# Patient Record
Sex: Female | Born: 1950
Health system: Southern US, Community
[De-identification: ages and names within clinical notes are randomized; demographics above are authoritative.]

## PROBLEM LIST (undated history)

## (undated) DIAGNOSIS — T7840XA Allergy, unspecified, initial encounter: Secondary | ICD-10-CM

## (undated) DIAGNOSIS — M810 Age-related osteoporosis without current pathological fracture: Secondary | ICD-10-CM

## (undated) DIAGNOSIS — R059 Cough, unspecified: Secondary | ICD-10-CM

## (undated) DIAGNOSIS — D649 Anemia, unspecified: Secondary | ICD-10-CM

## (undated) DIAGNOSIS — R05 Cough: Secondary | ICD-10-CM

## (undated) DIAGNOSIS — H269 Unspecified cataract: Secondary | ICD-10-CM

## (undated) DIAGNOSIS — E079 Disorder of thyroid, unspecified: Secondary | ICD-10-CM

## (undated) DIAGNOSIS — K219 Gastro-esophageal reflux disease without esophagitis: Secondary | ICD-10-CM

## (undated) DIAGNOSIS — R51 Headache: Secondary | ICD-10-CM

## (undated) DIAGNOSIS — M1990 Unspecified osteoarthritis, unspecified site: Secondary | ICD-10-CM

## (undated) DIAGNOSIS — K5790 Diverticulosis of intestine, part unspecified, without perforation or abscess without bleeding: Secondary | ICD-10-CM

## (undated) DIAGNOSIS — I10 Essential (primary) hypertension: Secondary | ICD-10-CM

## (undated) HISTORY — DX: Diverticulosis of intestine, part unspecified, without perforation or abscess without bleeding: K57.90

## (undated) HISTORY — DX: Disorder of thyroid, unspecified: E07.9

## (undated) HISTORY — PX: DENTAL SURGERY: SHX609

## (undated) HISTORY — PX: OTHER SURGICAL HISTORY: SHX169

## (undated) HISTORY — PX: UPPER GASTROINTESTINAL ENDOSCOPY: SHX188

## (undated) HISTORY — DX: Cough: R05

## (undated) HISTORY — DX: Unspecified osteoarthritis, unspecified site: M19.90

## (undated) HISTORY — DX: Allergy, unspecified, initial encounter: T78.40XA

## (undated) HISTORY — DX: Headache: R51

## (undated) HISTORY — DX: Essential (primary) hypertension: I10

## (undated) HISTORY — DX: Gastro-esophageal reflux disease without esophagitis: K21.9

## (undated) HISTORY — DX: Age-related osteoporosis without current pathological fracture: M81.0

## (undated) HISTORY — DX: Unspecified cataract: H26.9

## (undated) HISTORY — DX: Cough, unspecified: R05.9

## (undated) HISTORY — DX: Anemia, unspecified: D64.9

---

## 1998-04-29 ENCOUNTER — Ambulatory Visit (HOSPITAL_COMMUNITY): Admission: RE | Admit: 1998-04-29 | Discharge: 1998-04-29 | Payer: Self-pay | Admitting: *Deleted

## 1998-09-28 ENCOUNTER — Ambulatory Visit (HOSPITAL_COMMUNITY): Admission: RE | Admit: 1998-09-28 | Discharge: 1998-09-28 | Payer: Self-pay | Admitting: *Deleted

## 1999-03-09 ENCOUNTER — Other Ambulatory Visit: Admission: RE | Admit: 1999-03-09 | Discharge: 1999-03-09 | Payer: Self-pay | Admitting: Obstetrics and Gynecology

## 1999-04-08 ENCOUNTER — Ambulatory Visit (HOSPITAL_COMMUNITY): Admission: RE | Admit: 1999-04-08 | Discharge: 1999-04-08 | Payer: Self-pay | Admitting: *Deleted

## 2000-03-09 ENCOUNTER — Other Ambulatory Visit: Admission: RE | Admit: 2000-03-09 | Discharge: 2000-03-09 | Payer: Self-pay | Admitting: *Deleted

## 2000-03-27 ENCOUNTER — Ambulatory Visit (HOSPITAL_COMMUNITY): Admission: RE | Admit: 2000-03-27 | Discharge: 2000-03-27 | Payer: Self-pay | Admitting: *Deleted

## 2000-03-27 ENCOUNTER — Encounter: Payer: Self-pay | Admitting: *Deleted

## 2000-03-30 ENCOUNTER — Encounter: Admission: RE | Admit: 2000-03-30 | Discharge: 2000-03-30 | Payer: Self-pay | Admitting: *Deleted

## 2000-03-30 ENCOUNTER — Encounter: Payer: Self-pay | Admitting: *Deleted

## 2000-09-06 ENCOUNTER — Encounter: Admission: RE | Admit: 2000-09-06 | Discharge: 2000-09-06 | Payer: Self-pay | Admitting: Family Medicine

## 2000-09-06 ENCOUNTER — Encounter: Payer: Self-pay | Admitting: Family Medicine

## 2001-08-14 ENCOUNTER — Other Ambulatory Visit: Admission: RE | Admit: 2001-08-14 | Discharge: 2001-08-14 | Payer: Self-pay | Admitting: Obstetrics and Gynecology

## 2001-08-14 ENCOUNTER — Encounter: Payer: Self-pay | Admitting: Obstetrics and Gynecology

## 2001-08-14 ENCOUNTER — Encounter: Admission: RE | Admit: 2001-08-14 | Discharge: 2001-08-14 | Payer: Self-pay | Admitting: Obstetrics and Gynecology

## 2002-09-01 ENCOUNTER — Other Ambulatory Visit: Admission: RE | Admit: 2002-09-01 | Discharge: 2002-09-01 | Payer: Self-pay | Admitting: Obstetrics and Gynecology

## 2003-10-27 ENCOUNTER — Other Ambulatory Visit: Admission: RE | Admit: 2003-10-27 | Discharge: 2003-10-27 | Payer: Self-pay | Admitting: Obstetrics and Gynecology

## 2004-04-21 ENCOUNTER — Ambulatory Visit: Payer: Self-pay | Admitting: Internal Medicine

## 2004-11-04 ENCOUNTER — Other Ambulatory Visit: Admission: RE | Admit: 2004-11-04 | Discharge: 2004-11-04 | Payer: Self-pay | Admitting: Obstetrics and Gynecology

## 2004-11-10 ENCOUNTER — Ambulatory Visit (HOSPITAL_COMMUNITY): Admission: RE | Admit: 2004-11-10 | Discharge: 2004-11-10 | Payer: Self-pay | Admitting: Obstetrics and Gynecology

## 2004-12-21 ENCOUNTER — Other Ambulatory Visit: Admission: RE | Admit: 2004-12-21 | Discharge: 2004-12-21 | Payer: Self-pay | Admitting: Interventional Radiology

## 2004-12-21 ENCOUNTER — Encounter (INDEPENDENT_AMBULATORY_CARE_PROVIDER_SITE_OTHER): Payer: Self-pay | Admitting: *Deleted

## 2004-12-21 ENCOUNTER — Encounter: Admission: RE | Admit: 2004-12-21 | Discharge: 2004-12-21 | Payer: Self-pay | Admitting: *Deleted

## 2005-06-30 ENCOUNTER — Encounter: Admission: RE | Admit: 2005-06-30 | Discharge: 2005-06-30 | Payer: Self-pay | Admitting: *Deleted

## 2006-07-20 ENCOUNTER — Encounter: Admission: RE | Admit: 2006-07-20 | Discharge: 2006-07-20 | Payer: Self-pay | Admitting: *Deleted

## 2008-03-06 LAB — CONVERTED CEMR LAB: Pap Smear: NORMAL

## 2008-04-07 ENCOUNTER — Encounter: Admission: RE | Admit: 2008-04-07 | Discharge: 2008-04-07 | Payer: Self-pay | Admitting: Obstetrics and Gynecology

## 2008-06-29 HISTORY — PX: NASAL SINUS SURGERY: SHX719

## 2008-12-24 LAB — CONVERTED CEMR LAB
HDL: 44 mg/dL
LDL Cholesterol: 106 mg/dL

## 2009-01-14 ENCOUNTER — Ambulatory Visit: Payer: Self-pay | Admitting: Family Medicine

## 2009-01-14 ENCOUNTER — Encounter: Payer: Self-pay | Admitting: Family Medicine

## 2009-01-14 DIAGNOSIS — E119 Type 2 diabetes mellitus without complications: Secondary | ICD-10-CM

## 2009-01-14 DIAGNOSIS — I10 Essential (primary) hypertension: Secondary | ICD-10-CM | POA: Insufficient documentation

## 2009-01-14 DIAGNOSIS — E559 Vitamin D deficiency, unspecified: Secondary | ICD-10-CM

## 2009-01-14 DIAGNOSIS — E039 Hypothyroidism, unspecified: Secondary | ICD-10-CM | POA: Insufficient documentation

## 2009-02-09 ENCOUNTER — Telehealth: Payer: Self-pay | Admitting: Family Medicine

## 2009-04-16 ENCOUNTER — Telehealth: Payer: Self-pay | Admitting: Family Medicine

## 2009-05-06 ENCOUNTER — Telehealth: Payer: Self-pay | Admitting: Family Medicine

## 2009-07-15 ENCOUNTER — Telehealth: Payer: Self-pay | Admitting: Family Medicine

## 2009-08-18 ENCOUNTER — Telehealth: Payer: Self-pay | Admitting: Family Medicine

## 2009-10-04 ENCOUNTER — Telehealth: Payer: Self-pay | Admitting: Family Medicine

## 2009-10-26 ENCOUNTER — Encounter: Payer: Self-pay | Admitting: Family Medicine

## 2010-04-05 ENCOUNTER — Encounter: Payer: Self-pay | Admitting: Family Medicine

## 2010-04-06 ENCOUNTER — Telehealth: Payer: Self-pay | Admitting: Family Medicine

## 2010-04-12 ENCOUNTER — Encounter: Payer: Self-pay | Admitting: Family Medicine

## 2010-04-19 ENCOUNTER — Telehealth: Payer: Self-pay | Admitting: Family Medicine

## 2010-04-21 ENCOUNTER — Encounter: Payer: Self-pay | Admitting: Family Medicine

## 2010-04-21 LAB — CONVERTED CEMR LAB: Pap Smear: NORMAL

## 2010-05-10 ENCOUNTER — Ambulatory Visit
Admission: RE | Admit: 2010-05-10 | Discharge: 2010-05-10 | Payer: Self-pay | Source: Home / Self Care | Attending: Family Medicine | Admitting: Family Medicine

## 2010-05-10 ENCOUNTER — Encounter: Payer: Self-pay | Admitting: Family Medicine

## 2010-05-22 ENCOUNTER — Encounter: Payer: Self-pay | Admitting: Obstetrics and Gynecology

## 2010-05-25 ENCOUNTER — Encounter: Payer: Self-pay | Admitting: Family Medicine

## 2010-06-02 NOTE — Progress Notes (Signed)
Summary: Metformin  Phone Note Refill Request Message from:  Scriptline on May 06, 2009 9:24 AM  Refills Requested: Medication #1:  METFORMIN HCL 500 MG TABS 1 tab by mouth by mouth two times a day with food.. [facility]  Is there a reason why she's only given 1 RF at a time?   Method Requested: Electronic Initial call taken by: Delilah Shan CMA (AAMA),  May 06, 2009 9:25 AM  Follow-up for Phone Call        No, we can do more at a time. Follow-up by: Ruthe Mannan MD,  May 06, 2009 9:41 AM    Prescriptions: METFORMIN HCL 500 MG TABS (METFORMIN HCL) 1 tab by mouth by mouth two times a day with food.  #60 x 11   Entered and Authorized by:   Ruthe Mannan MD   Signed by:   Ruthe Mannan MD on 05/06/2009   Method used:   Electronically to        [id number]       [address]       [place], [place]  [address]       Ph: [phone]       Fax: [phone]   RxID:   [id number]

## 2010-06-02 NOTE — Progress Notes (Signed)
Summary: Vitamin D2  Phone Note Refill Request Message from:  Scriptline on July 15, 2009 11:14 AM  Refills Requested: Medication #1:  ERGOCALCIFEROL 50000 UNIT CAPS One capsule once a week. [facility]  [address] [id number]   Last Lenox Ahr Date:  No date sent   Pharmacy Phone:  [phone]   Method Requested: Electronic Initial call taken by: Delilah Shan CMA (AAMA),  July 15, 2009 11:15 AM    Prescriptions: ERGOCALCIFEROL 50000 UNIT CAPS (ERGOCALCIFEROL) One capsule once a week.  #4 x 0   Entered and Authorized by:   Ruthe Mannan MD   Signed by:   Ruthe Mannan MD on 07/15/2009   Method used:   Electronically to        [id number]       [address]       [place], [place]  [address]       Ph: [phone]       Fax: [phone]   RxID:   [id number]

## 2010-06-02 NOTE — Progress Notes (Signed)
Summary: test srips  Phone Note Call from Patient   Summary of Call: [facility] called stating that [facility] still have not received rx for the test strips and was very upset about it, I told her that we sent it in on 12-6 and 12-13. [facility] to [facility] the test strips.  Initial call taken by: Melody Comas,  April 19, 2010 10:50 AM

## 2010-06-02 NOTE — Miscellaneous (Signed)
Summary: prevention update  Clinical Lists Changes  Observations: Added new observation of MAMMO DUE: 04/06/2011 (05/25/2010 7:54) Added new observation of PAP DUE: 04/22/2011 (05/25/2010 7:54) Added new observation of LAST PAP DAT: 04/21/2010 (04/21/2010 7:55) Added new observation of PAP SMEAR: normal (04/21/2010 7:55) Added new observation of LAST MAM DAT: 04/05/2010 (04/05/2010 7:55) Added new observation of MAMMOGRAM: normal (04/05/2010 7:55)     Last PAP:  normal (04/06/2010 8:04:46 AM) PAP Result Date:  04/21/2010 PAP Result:  normal Last Mammogram:  normal (04/06/2010 8:04:46 AM) Mammogram Result Date:  04/05/2010 Mammogram Result:  normal

## 2010-06-02 NOTE — Progress Notes (Signed)
Summary: Rx Vitamin D  Phone Note Refill Request Call back at [phone] Message from:  [facility] on October 05, 4538 9:81 AM  Refills Requested: Medication #1:  ERGOCALCIFEROL 50000 UNIT CAPS One capsule once a week. Received E-script request please advise.  Patient received a refill on 08/18/2009, #4, not sure if patient is to continue taking this or not.  Please advise.   Method Requested: Electronic Initial call taken by: Linde Gillis CMA Duncan Dull),  October 04, 2009 8:33 AM  Follow-up for Phone Call        Please deny refill for now and have her come in for lab visit to recheck Vit D. Follow-up by: Ruthe Mannan MD,  October 04, 2009 8:38 AM  Additional Follow-up for Phone Call Additional follow up Details #1::        Rx denied, patient scheduled to have vit d level checked tomorrow. Additional Follow-up by: Linde Gillis CMA Duncan Dull),  October 04, 2009 8:53 AM

## 2010-06-02 NOTE — Assessment & Plan Note (Signed)
Summary: FOLLOW UP   Vital Signs:  Patient profile:   60 year old female Height:      64 inches Weight:      189.50 pounds BMI:     32.65 Temp:     98.0 degrees F oral Pulse rate:   68 / minute Pulse rhythm:   regular BP sitting:   102 / 80  (left arm) Cuff size:   regular  Vitals Entered By: Linde Gillis CMA Duncan Dull) (May 10, 2010 7:50 AM) CC: follow up diabetes   History of Present Illness: 60 yo WF here to follow up.  1.  DM- last checked a few months ago at work and per pt, a1c improved (was 8.2 in 09/2009). On Metoformin 1000 mg two times a day and Amaryl 4 mg daily. Checks CBGs daily, running between 87-218.  Does not want a1c checked today because she was on steroids recently and made her sugars increase. Denies any episodes of hypoglycemia.  2.  Hypothyoidism- had a benign nodule removed several years ago.  Has been on Levothyroxine 75 micrograms for over a year.  Has been noticing more fatigue lately.  3.  HTN- has been controlled on Losartan.  No allergy to ACEI.  Has lost 10 pounds since last year with diet!  4.  Well woman- per pt, just saw Dr. Richardean Chimera last month- normal pap, mammogram and stool cards.    Current Medications (verified): 1)  Levothyroxine Sodium 75 Mcg Tabs (Levothyroxine Sodium) .... Take 1 Tablet By Mouth Once A Day 2)  Losartan Potassium 50 Mg Tabs (Losartan Potassium) .... Take 1 Tablet By Mouth Once A Day 3)  Oxybutynin Chloride 5 Mg Xr24h-Tab (Oxybutynin Chloride) .... Take 1 Tablet By Mouth Once A Day 4)  Nasonex 50 Mcg/act Susp (Mometasone Furoate) .... Once Daily 5)  Onetouch Ultra Test  Strp (Glucose Blood) .... Check Blood Sugar Two Times A Day 6)  Fish Oil   Oil (Fish Oil) .... Once Daily 7)  Stool Softener 100 Mg Caps (Docusate Sodium) .... 50 Mg. Once Daily 8)  Aspirin 81 Mg  Tabs (Aspirin) .... Take 1 Tablet By Mouth Once A Day 9)  Ultra Mega Nu Iron Plus .... Once Daily 10)  Lloyd Huger Med Sinus Rinse .... Once Daily 11)   Metformin Hcl 1000 Mg Tabs (Metformin Hcl) .... Take 1 Tablet By Mouth Two Times A Day 12)  Amaryl 4 Mg Tabs (Glimepiride) .[name].. 1 Tab By Mouth Daily.  Allergies: 1)  ! Penicillin  Past History:  Past Medical History: Last updated: 01/23/2009 HTN DM Hypothyroidism Chronic sinusitis s/p sinus surgery in 06/2008 (Dr.  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  Lives with husband and 61 yo son Harrison Mons), also has a 30 year old daughter Harriett Sine) who is a Engineer, manufacturing for [employer].  Risk Factors: Alcohol Use: <1 (01/23/09)  Risk Factors: Smoking Status: never (01/23/2009)  Review of Systems      See HPI General:  Complains of fatigue. Eyes:  Denies blurring. ENT:  Denies difficulty swallowing. CV:  Denies chest pain or discomfort. Resp:  Denies shortness of breath. Psych:  Denies anxiety and depression. Endo:  Denies cold intolerance and heat intolerance.  Physical Exam  General:  Well-developed,well-nourished,in no acute distress;  alert,appropriate and cooperative throughout examination, obese Head:  normocephalic and atraumatic.   Eyes:  vision grossly intact, pupils equal, pupils round, and pupils reactive to light.   Ears:  R ear normal and L ear normal.   Nose:  no external deformity.   Mouth:  good dentition.   Neck:  No deformities, masses, or tenderness noted. Lungs:  Normal respiratory effort, chest expands symmetrically. Lungs are clear to auscultation, no crackles or wheezes. Heart:  Normal rate and regular rhythm. S1 and S2 normal without gallop, murmur, click, rub or other extra sounds. Extremities:  No clubbing, cyanosis, edema, or deformity noted with normal full range of motion of all joints.   Neurologic:   alert & oriented X3 and gait normal.   Skin:  Intact without suspicious lesions or rashes Psych:  Oriented X3, not anxious appearing, and not depressed appearing.     Impression & Recommendations:  Problem # 1:  DM (ICD-250.00) Assessment Unchanged Per pt, she does not want a1c today. She is fasting, will check a fasting CBG (BMET). Continue current meds, follow up in 3 months.   Her updated medication list for this problem includes:    Losartan Potassium 50 Mg Tabs (Losartan potassium) .[name]... Take 1 tablet by mouth once a day    Aspirin 81 Mg Tabs (Aspirin) .[name]... Take 1 tablet by mouth once a day    Metformin Hcl 1000 Mg Tabs (Metformin hcl) .[name]... Take 1 tablet by mouth two times a day    Amaryl 4 Mg Tabs (Glimepiride) .[name]... 1 tab by mouth daily.  Orders: Venipuncture (84132) Specimen Handling (44010) TLB-Lipid Panel (80061-LIPID) TLB-BMP (Basic Metabolic Panel-BMET) (80048-METABOL) TLB-TSH (Thyroid Stimulating Hormone) (84443-TSH)  Problem # 2:  UNSPECIFIED HYPOTHYROIDISM (ICD-244.9) Assessment: Unchanged will recheck TSH today. Her updated medication list for this problem includes:    Levothyroxine Sodium 75 Mcg Tabs (Levothyroxine sodium) .[name]... Take 1 tablet by mouth once a day  Orders: Specimen Handling ([address]) TLB-Lipid Panel (80061-LIPID) TLB-BMP (Basic Metabolic Panel-BMET) (80048-METABOL) TLB-TSH (Thyroid Stimulating Hormone) (84443-TSH)  Problem # 3:  SCREENING FOR ISCHEMIC HEART DISEASE (ICD-V81.0) lipid panel today. Orders: Specimen Handling (66440) TLB-Lipid Panel (80061-LIPID) TLB-BMP (Basic Metabolic Panel-BMET) (80048-METABOL) TLB-TSH (Thyroid Stimulating Hormone) (84443-TSH)  Problem # 4:  HEALTH SCREENING (ICD-V70.0) Assessment: Comment Only Will request records from Dr. Arelia Sneddon.  Complete Medication List: 1)  Levothyroxine Sodium 75 Mcg Tabs (Levothyroxine sodium) .... Take 1 tablet by mouth once a day 2)  Losartan Potassium 50 Mg Tabs (Losartan  potassium) .... Take 1 tablet by mouth once a day 3)  Oxybutynin Chloride 5 Mg Xr24h-tab (Oxybutynin chloride) .... Take 1 tablet by mouth once a day 4)  Nasonex 50 Mcg/act Susp (Mometasone furoate) .... Once daily 5)  Onetouch Ultra Test Strp (Glucose blood) .... Check blood sugar two times a day 6)  Fish Oil Oil (Fish oil) .... Once daily 7)  Stool Softener 100 Mg Caps (Docusate sodium) .... 50 mg. once daily 8)  Aspirin 81 Mg Tabs (Aspirin) .... Take 1 tablet by mouth once a day 9)  Ultra Mega Nu Iron Plus  .... Once daily 10)  Lloyd Huger Med Sinus Rinse  .... Once daily 11)  Metformin Hcl 1000 Mg Tabs (Metformin hcl) .... Take 1 tablet by mouth two times a day 12)  Amaryl 4 Mg Tabs (Glimepiride) .[name].. 1 tab by mouth daily. Prescriptions: AMARYL 4 MG TABS (GLIMEPIRIDE) 1 tab by mouth daily.  #90 x 3   Entered and Authorized by:   Jovita Gamma  Dayton Martes MD   Signed by:   Ruthe Mannan MD on 05/10/2010   Method used:   Print then Give to Patient   RxID:   [id number] METFORMIN HCL 1000 MG TABS (METFORMIN HCL) Take 1 tablet by mouth two times a day  #180 x 3   Entered and Authorized by:   Ruthe Mannan MD   Signed by:   Ruthe Mannan MD on 05/10/2010   Method used:   Print then Give to Patient   RxID:   [id number] ONETOUCH ULTRA TEST  STRP (GLUCOSE BLOOD) Check blood sugar two times a day  #100 x 12   Entered and Authorized by:   Ruthe Mannan MD   Signed by:   Ruthe Mannan MD on 05/10/2010   Method used:   Print then Give to Patient   RxID:   [id number] NASONEX 50 MCG/ACT SUSP (MOMETASONE FUROATE) once daily  #3 x 3   Entered and Authorized by:   Ruthe Mannan MD   Signed by:   Ruthe Mannan MD on 05/10/2010   Method used:   Print then Give to Patient   RxID:   [id number] OXYBUTYNIN CHLORIDE 5 MG XR24H-TAB (OXYBUTYNIN CHLORIDE) Take 1 tablet by mouth once a day  #90 x 3   Entered and Authorized by:   Ruthe Mannan MD   Signed by:   Ruthe Mannan MD on 05/10/2010   Method used:   Print then Give to  Patient   RxID:   [id number] LOSARTAN POTASSIUM 50 MG TABS (LOSARTAN POTASSIUM) Take 1 tablet by mouth once a day  #90 x 3   Entered and Authorized by:   Ruthe Mannan MD   Signed by:   Ruthe Mannan MD on 05/10/2010   Method used:   Print then Give to Patient   RxID:   [id number] LEVOTHYROXINE SODIUM 75 MCG TABS (LEVOTHYROXINE SODIUM) Take 1 tablet by mouth once a day  #90 x 3   Entered and Authorized by:   Ruthe Mannan MD   Signed by:   Ruthe Mannan MD on 05/10/2010   Method used:   Print then Give to Patient   RxID:   [id number]    Orders Added: 1)  Venipuncture [70623] 2)  Specimen Handling [99000] 3)  TLB-Lipid Panel [80061-LIPID] 4)  TLB-BMP (Basic Metabolic Panel-BMET) [80048-METABOL] 5)  TLB-TSH (Thyroid Stimulating Hormone) [84443-TSH] 6)  Est. Patient Level IV [76283]    Current Allergies (reviewed today): ! PENICILLIN  Last PAP:  normal (03/06/2008 11:08:18 AM) PAP Result Date:  04/06/2010 PAP Result:  normal Last Mammogram:  normal (03/06/2008 11:08:18 AM) Mammogram Result Date:  04/06/2010 Mammogram Result:  normal

## 2010-06-02 NOTE — Progress Notes (Signed)
Summary: regarding labs  Phone Note Call from Patient Call back at Work Phone [phone]   Caller: Patient Call For: Ruthe Mannan MD Summary of Call: Pt is due to have vitamin D level checked and she is asking if there are any other labs that you want checked.  She would like a written order because she works for [employer] and can get it done there for free.  Call when ready and she will pick up. Initial call taken by: Lowella Petties CMA,  October 04, 2009 2:50 PM  Follow-up for Phone Call        In my box. Ruthe Mannan MD  October 04, 2009 2:52 PM  Dr. Dayton Martes wrote order for Vitamin D and HGBA1C.  Left message on cell phone voicemail that order would be left at front desk for pick up.  Follow-up by: Linde Gillis CMA Duncan Dull),  October 04, 2009 2:57 PM

## 2010-06-02 NOTE — Progress Notes (Signed)
Summary: Vitamind D  Phone Note Refill Request Message from:  Scriptline on August 18, 2009 8:40 AM  Refills Requested: Medication #1:  ERGOCALCIFEROL 50000 UNIT CAPS One capsule once a week. Not sure that this patient is supposed to continue Vitamin D. [facility]  [address] [id number]   Last Lenox Ahr Date:  No date sent   Pharmacy Phone:  [phone]   Method Requested: Electronic Initial call taken by: Delilah Shan CMA (AAMA),  August 18, 2009 8:41 AM    Prescriptions: ERGOCALCIFEROL 50000 UNIT CAPS (ERGOCALCIFEROL) One capsule once a week.  #4 x 0   Entered and Authorized by:   Ruthe Mannan MD   Signed by:   Ruthe Mannan MD on 08/18/2009   Method used:   Electronically [facility]        [id number]       [address]       [place], [place]  [address]       Ph: [phone]       Fax: [phone]   RxID:   [id number]

## 2010-06-02 NOTE — Letter (Signed)
Summary: Generic Letter  [facility]  [address] [place], [place] [address]   Phone: [phone]  Fax: [phone]    04/12/2010  RAQUELLE PIETRO [address] [place], [place]  [address]  Dear Ms. Meckley,    We have been unable to reach you by telephone.  At your convenience please call our office.  When calling ask to speak with Lowella Bandy, medical assistant for Dr. Dayton Martes.       Sincerely,      Dr. Ruthe Mannan

## 2010-06-02 NOTE — Progress Notes (Signed)
Summary: Wants Medication increased....  Phone Note Call from Patient   Caller: Patient Call For: [phone] Reason for Call: Acute Illness Summary of Call: Pt called, has sinus infection.  She is presently taking Avelox and Prednisone- Meds causing blood sugars to increase. Pt wants her Gliperid Medicatons increased.Marland KitchenMarland KitchenMarland KitchenCalled back # [phone].Marland KitchenDaine Gip  April 06, 2010 3:26 PM [facility]acy [facility]  Initial call taken by: Melody Comas,  April 06, 2010 5:13 PM  Follow-up for Phone Call        who prescribed those medications? rx send for amaryl 4 mg daily. Ruthe Mannan MD  April 07, 2010 7:25 AM  Left message on machine at home for patient to return call.  Linde Gillis CMA Duncan Dull)  April 07, 2010 8:06 AM   Left message on machine at home for patient to return call.  Linde Gillis CMA Duncan Dull)  April 08, 2010 8:24 AM   Left message on machine for patient to return call. Melody Comas  April 11, 2010 2:15 PM  Left mailed to patient advising her to contact our office.  Follow-up by: Linde Gillis CMA Duncan Dull),  April 12, 2010 8:25 AM    New/Updated Medications: AMARYL 4 MG TABS (GLIMEPIRIDE) 1 tab by mouth daily. Prescriptions: AMARYL 4 MG TABS (GLIMEPIRIDE) 1 tab by mouth daily.  #30 x 1   Entered and Authorized by:   Ruthe Mannan MD   Signed by:   Ruthe Mannan MD on 04/07/2010   Method used:   Electronically to        [id number]       [address]       [place], [place]  [address]       Ph: [phone]       Fax: [phone]   RxID:   [id number]   Appended Document: Wants Medication increased.... Pt called to let you know that Dr. Jenne Campus had prescribed the avelox and prednisone that she has been taking.  She also wanted you to know that she had her A1C checked in september andt it was 7.6.  This was checked during a health screen at her job.

## 2010-06-08 ENCOUNTER — Telehealth: Payer: Self-pay | Admitting: Family Medicine

## 2010-06-08 ENCOUNTER — Encounter: Payer: Self-pay | Admitting: Family Medicine

## 2010-06-08 ENCOUNTER — Ambulatory Visit (INDEPENDENT_AMBULATORY_CARE_PROVIDER_SITE_OTHER): Payer: 59 | Admitting: Family Medicine

## 2010-06-08 DIAGNOSIS — J019 Acute sinusitis, unspecified: Secondary | ICD-10-CM

## 2010-06-08 DIAGNOSIS — J309 Allergic rhinitis, unspecified: Secondary | ICD-10-CM | POA: Insufficient documentation

## 2010-06-08 NOTE — Letter (Signed)
Summary: Records from [facility] 2010 - 2011  Records from [facility] 2010 - 2011   Imported By: Maryln Gottron 06/03/2010 15:43:28  _____________________________________________________________________  External Attachment:    Type:   Image     Comment:   External Document

## 2010-06-08 NOTE — Miscellaneous (Signed)
Summary: [facility]  ROI   Imported By: Kassie Mends 06/03/2010 08:41:01  _____________________________________________________________________  External Attachment:    Type:   Image     Comment:   External Document

## 2010-06-10 ENCOUNTER — Encounter: Payer: Self-pay | Admitting: Family Medicine

## 2010-06-10 ENCOUNTER — Ambulatory Visit (INDEPENDENT_AMBULATORY_CARE_PROVIDER_SITE_OTHER): Payer: 59 | Admitting: Family Medicine

## 2010-06-10 DIAGNOSIS — J209 Acute bronchitis, unspecified: Secondary | ICD-10-CM | POA: Insufficient documentation

## 2010-06-16 NOTE — Assessment & Plan Note (Signed)
Summary: cough,fever, and left side around the ear and neck area/jbb   Vital Signs:  Patient Profile:   60 Years Old Female CC:      Cold & URI symptoms, Left ear ache Height:     63 inches Weight:      189 pounds BMI:     33.60 O2 Sat:      98 % O2 treatment:    Room Air Temp:     97.1 degrees F oral Pulse rate:   83 / minute Pulse rhythm:   regular Resp:     20 per minute BP sitting:   162 / 89  (right arm)  Pt. in pain?   yes    Location:   head    Intensity:   6    Type:       aching  Vitals Entered By: Levonne Spiller EMT-P (June 08, 2010 4:11 PM)              Is Patient Diabetic? Yes   Does patient need assistance? Functional Status Self care Ambulation Normal      Current Allergies (reviewed today): ! PENICILLIN ! BIAXINHistory of Present Illness History from: patient Chief Complaint: Cold & URI symptoms, Left ear ache History of Present Illness: The patient presented today because she has been having 2 weeks of progressive cough and congestion and deep barking coughing.  She is having some fever as well.  She is wheezing and coughing all night long.  She is producing yellow-gray sputum. She reports that she has been taking Zyrtec and fluticasone nasal spray and that has helped her symptoms some but no significant improvement. She reports that she feels malaise and fatigue and reports that overall she has been feeling bad for the past several days progressively getting worse. She reports that she's taken some over-the-counter decongestants as well. Her blood pressure is elevated. She's not having any significant shortness of breath at this time but reports occasional shortness of breath with coughing episodes. She is coughing all the time. She reports that she has not gotten a significant break from the coughing and quite some time. She reports that she hears her chest wheezing.  REVIEW OF SYSTEMS Constitutional Symptoms      Denies fever, chills, night sweats,  weight loss, weight gain, and fatigue.  Eyes       Denies change in vision, eye pain, eye discharge, glasses, contact lenses, and eye surgery. Ear/Nose/Throat/Mouth       Complains of ear pain, sinus problems, and sore throat.      Denies hearing loss/aids, change in hearing, ear discharge, dizziness, frequent runny nose, frequent nose bleeds, hoarseness, and tooth pain or bleeding.  Respiratory       Complains of productive cough and wheezing.      Denies dry cough, shortness of breath, asthma, bronchitis, and emphysema/COPD.      Comments: Yellow colored sputum Cardiovascular       Denies murmurs, chest pain, and tires easily with exhertion.    Gastrointestinal       Denies stomach pain, nausea/vomiting, diarrhea, constipation, blood in bowel movements, and indigestion. Genitourniary       Denies painful urination, blood or discharge from vagina, kidney stones, and loss of urinary control. Neurological       Denies paralysis, seizures, and fainting/blackouts. Musculoskeletal       Denies muscle pain, joint pain, joint stiffness, decreased range of motion, redness, swelling, muscle weakness, and gout.  Skin  Denies bruising, unusual mles/lumps or sores, and hair/skin or nail changes.  Psych       Denies mood changes, temper/anger issues, anxiety/stress, speech problems, depression, and sleep problems. Other Comments: Pt's chief complaint is right ear pain and sore throat.   Past History:  Past Medical History: Reviewed history from 01/14/2009 and no changes required. HTN DM Hypothyroidism Chronic sinusitis s/p sinus surgery in 06/2008 (Dr.  Evangeline Gula)  Past Surgical History: Reviewed history from 01/14/2009 and no changes required. Sinus surgery - 06/2008  Family History: Reviewed history from 01/14/2009 and no changes required. Mom died of Massive MI at age 70 Dad- HTN, COPD, died at 6  Social History: Reviewed history from 01/14/2009 and no changes required. 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  Lives with husband and 59 yo son Harrison Mons), also has a 83 year old daughter Harriett Sine) who is a Engineer, manufacturing for [employer].  Never Smoked Alcohol use-no Drug use-no Drug Use:  no Occupation:  Professional Physical Exam General appearance: well developed, well nourished, no acute distress Head: normocephalic, atraumatic Eyes: conjunctivae and lids normal Pupils: equal, round, reactive to light Ears: normal, no lesions or deformities Nasal: marked sinus and nasal congestion, thick yellow drainage noted, crusted dry yellow mucus seen  Oral/Pharynx: tongue normal, posterior pharynx without erythema or exudate Neck: neck supple,  trachea midline, no masses Chest/Lungs: no rales, wheezes, or rhonchi bilateral, breath sounds equal without effort Heart: regular rate and  rhythm, no murmur Abdomen: soft, non-tender without obvious organomegaly Extremities: normal extremities Neurological: grossly intact and non-focal Skin: no obvious rashes or lesions MSE: oriented to time, place, and person Assessment New Problems: EAR PAIN, LEFT (ICD-388.70) ALLERGIC RHINITIS CAUSE UNSPECIFIED (ICD-477.9) ACUTE SINUSITIS, UNSPECIFIED (ICD-461.9)   Patient Education: Patient and/or caregiver instructed in the following: rest, fluids, Tylenol prn. The risks, benefits and possible side effects were clearly explained and discussed with the patient.  The patient verbalized clear understanding.  The patient was given instructions to return if symptoms don't improve, worsen or new changes develop.  If it is not during clinic hours and the patient cannot get back to this clinic then the patient was told to seek medical care at an available urgent care or emergency department.  The patient verbalized understanding.   Demonstrates willingness to comply.  Plan New Medications/Changes: VENTOLIN HFA 108 (90 BASE) MCG/ACT AERS (ALBUTEROL SULFATE) take 2 puffs inhaled  every 3 hours as needed sob, cough, wheezing  #1 x 0, 06/08/2010, Clanford Johnson MD AZITHROMYCIN 250 MG TABS (AZITHROMYCIN) take 2 tabs by mouth on day 1, then take 1 tab by mouth daily until completed  #6 x 1, 06/08/2010, Standley Dakins MD  Follow Up: Follow up in 2-3 days if no improvement, Follow up on an as needed basis, Follow up with Primary Physician  The patient and/or caregiver has been counseled thoroughly with regard to medications prescribed including dosage, schedule, interactions, rationale for use, and possible side effects and they verbalize understanding.  Diagnoses and expected course of recovery discussed and will return if not improved as expected or if the condition worsens. Patient and/or caregiver verbalized understanding.  Prescriptions: VENTOLIN HFA 108 (90 BASE) MCG/ACT AERS (ALBUTEROL SULFATE) take 2 puffs inhaled every 3 hours as needed sob, cough, wheezing  #1 x 0   Entered and Authorized by:   Standley Dakins MD   Signed by:   Standley Dakins MD on 06/08/2010   Method used:   Electronically to  [facility] [address] (retail)       [address], [address]       [place], [place]  [address]       Ph: [phone]       Fax: [phone]   RxID:   [id number] AZITHROMYCIN 250 MG TABS (AZITHROMYCIN) take 2 tabs by mouth on day 1, then take 1 tab by mouth daily until completed  #6 x 1   Entered and Authorized by:   Standley Dakins MD   Signed by:   Standley Dakins MD on 06/08/2010   Method used:   [facility] to        [id number]       [address]       [place], [place]  [address]       Ph: [phone]       Fax: [phone]   RxID:   [id number]  The patient said that she has taken azithromycin many times in the past and tolerated it very well with no adverse reactions.  Patient Instructions: 1)  Go to the pharmacy and pick up your prescription (s).  It may take up to 30 mins  for electronic prescriptions to be delivered to the pharmacy.  Please call if your pharmacy has not received your prescriptions after 30 minutes.   2)  Return or go to the ER if no improvement or symptoms getting worse.   3)  Oral Rehydration Solution: drink 1/2 ounce every 15 minutes. If tolerated afert 1 hour, drink 1 ounce every 15 minutes. As you can tolerate, keep adding 1/2 ounce every 15 minutes, up to a total of 2-4 ounces. Contact the office if unable to tolerate oral solution, if you keep vomiting, or you continue to have signs of dehydration. 4)  Take your antibiotic as prescribed until ALL of it is gone, but stop if you develop a rash or swelling and contact our office as soon as possible. 5)  Acute sinusitis symptoms for less than 10 days are not helped by antibiotics.Use warm moist compresses, and over the counter decongestants ( only as directed). Call if no improvement in 5-7 days, sooner if increasing pain, fever, or new symptoms. 6)  Continue to use the fluticasone nasal spray and the Zyrtec because I think this will help your symptoms overall in combination with the other medications prescribed. 7)  I would like for you to monitor your blood pressure more closely over the next 2 weeks. I would like for you to followup with her primary care provider especially if your blood pressure is greater than 140/90 I would like for you to make an appointment immediately. 8)  The patient was informed that there is no on-call provider or services available at this clinic during off-hours (when the clinic is closed).  If the patient developed a problem or concern that required immediate attention, the patient was advised to go the the nearest available urgent care or emergency department for medical care.  The patient verbalized understanding.

## 2010-06-16 NOTE — Progress Notes (Signed)
Summary: pt has cough  Phone Note Call from Patient Call back at Home Phone [phone]   Caller: Patient Call For: Ruthe Mannan MD Summary of Call: Pt complains of cough, fever for 2 days.  She is requesting a strong cough medicine.  Advised her that she would need to be seen, but we dont have any appt available today.  I suggested she go to [facility] today or we can see her tomorrow.  She said she will probably go to [facility] today, but will call tomorrow if not. Initial call taken by: Lowella Petties CMA, AAMA,  June 08, 2010 10:29 AM  Follow-up for Phone Call        ok thank you. Ruthe Mannan MD  June 08, 2010 2:30 PM

## 2010-06-16 NOTE — Assessment & Plan Note (Signed)
Summary: F/U [facility] ON 06/08/10/CLE   [facility]   Vital Signs:  Patient profile:   60 year old female Weight:      191.25 pounds O2 Sat:      98 % on Room air Temp:     98.1 degrees F oral Pulse rate:   100 / minute Pulse rhythm:   regular BP sitting:   124 / 78  (left arm) Cuff size:   large  Vitals Entered By: Selena Batten Dance CMA (AAMA) (June 10, 2010 8:48 AM)  O2 Flow:  Room air CC: [facility]   History of Present Illness: CC: f/u [facility]  pt [facility]/08/[facility] with 2wk h/o cough, congestion, ST and earache, dx with acute sinusitis (but pt states told had bronchitis) and treated with ventolin and zpack, cough syrup with codeine.  initially thought getting better but then this morning deteriorated.  This am when woke up at 3:30am felt couldn't breathe, wheezy.  only mildly controlling with ventolin.  + ST and cloudy nasal drainage.  No fevers, chills, abd pain, n/v/d, rashes, arthrlagias.  No h/o asthma.  h/o chronic sinusitis and sinus surgery.  also found to have elevated BP but acutely ill.  No smokers at home,  + sick contacts at home.  allergic to PCN, biaxin.  tolerated zpack in past.  Current Medications (verified): 1)  Levothyroxine Sodium 75 Mcg Tabs (Levothyroxine Sodium) .... Take 1 Tablet By Mouth Once A Day 2)  Losartan Potassium 50 Mg Tabs (Losartan Potassium) .... Take 1 Tablet By Mouth Once A Day 3)  Oxybutynin Chloride 5 Mg Xr24h-Tab (Oxybutynin Chloride) .... Take 1 Tablet By Mouth Once A Day 4)  Flonase 50 Mcg/act Susp (Fluticasone Propionate) .... As Directed 5)  Onetouch Ultra Test  Strp (Glucose Blood) .... Check Blood Sugar Two Times A Day 6)  Fish Oil   Oil (Fish Oil) .... Once Daily 7)  Stool Softener 100 Mg Caps (Docusate Sodium) .... 50 Mg. Once Daily 8)  Aspirin 81 Mg  Tabs (Aspirin) .... Take 1 Tablet By Mouth Once A Day 9)  Ultra Mega Nu Iron Plus .... Once Daily 10)  Lloyd Huger Med Sinus Rinse .... Once Daily 11)   Metformin Hcl 1000 Mg Tabs (Metformin Hcl) .... Take 1 Tablet By Mouth Two Times A Day 12)  Amaryl 4 Mg Tabs (Glimepiride) .[name].. 1 Tab By Mouth Daily. 13)  Azithromycin 250 Mg Tabs (Azithromycin) .... Take 2 Tabs By Mouth On Day 1, Then Take 1 Tab By Mouth Daily Until Completed 14)  Ventolin Hfa 108 (90 Base) Mcg/act Aers (Albuterol Sulfate) .... Take 2 Puffs Inhaled Every 3 Hours As Needed Sob, Cough, Wheezing 15)  Guiatuss Ac 100-10 Mg/7ml Syrp (Guaifenesin-Codeine)  Allergies: 1)  ! Penicillin 2)  ! Biaxin  Past History:  Past Medical History: Last updated: 01/14/2009 HTN DM Hypothyroidism Chronic sinusitis s/p sinus surgery in 06/2008 (Dr.  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  Lives with husband and 14 yo son Harrison Mons), also has a 69 year old daughter Harriett Sine) who is a Engineer, manufacturing for [employer].  Never Smoked Alcohol use-no Drug use-no PMH-FH-SH reviewed for relevance  Review of Systems       per HPI  Physical Exam  General:  Well-developed,well-nourished,in no acute distress; alert,appropriate and cooperative throughout examination, obese.  hoarse voice, cough. Head:  normocephalic and atraumatic.   Eyes:  vision grossly intact, pupils equal, pupils round, and pupils reactive to light.   Ears:  R ear normal and L ear normal.   Nose:  + mild discharge, + congestion Mouth:  Oral mucosa and oropharynx without lesions or exudates.  Teeth in good repair. Neck:  No deformities, masses, or tenderness noted. Lungs:  tight, very mild expiratory wheeze.  no crackles/rales.  normal wob.  + coughing fit with deep breath  after alb/atrovent neb,improved air movement, no wheeze. Heart:  Normal rate and regular rhythm. S1 and S2 normal without gallop, murmur, click, rub or other extra sounds. Pulses:  2+ rad pulses, brisk cap  refill Extremities:  no pedal edema   Impression & Recommendations:  Problem # 1:  ACUTE BRONCHITIS (ICD-466.0) no h/o asthma but does have significant airway reactivity on exam today.  gave alb/atrovent given somewhat tightness, improved.  short course of steroids, given h/o DM, advised to monitor sugars and if elevated start amaryl daily (states takes only as needed).  continue zpack.  The following medications were removed from the medication list:    Guiatuss Ac 100-10 Mg/60ml Syrp (Guaifenesin-codeine) Her updated medication list for this problem includes:    Azithromycin 250 Mg Tabs (Azithromycin) .[name]... Take 2 tabs by mouth on day 1, then take 1 tab by mouth daily until completed    Ventolin Hfa 108 (90 Base) Mcg/act Aers (Albuterol sulfate) .[name]... Take 2 puffs inhaled every 3 hours as needed sob, cough, wheezing    Cheratussin Ac 100-10 Mg/91ml Syrp (Guaifenesin-codeine) ..... (sugar free) 1 teaspoon q6 hours as needed cough  Complete Medication List: 1)  Levothyroxine Sodium 75 Mcg Tabs (Levothyroxine sodium) .... Take 1 tablet by mouth once a day 2)  Losartan Potassium 50 Mg Tabs (Losartan potassium) .... Take 1 tablet by mouth once a day 3)  Oxybutynin Chloride 5 Mg Xr24h-tab (Oxybutynin chloride) .... Take 1 tablet by mouth once a day 4)  Flonase 50 Mcg/act Susp (Fluticasone propionate) .... As directed 5)  Onetouch Ultra Test Strp (Glucose blood) .... Check blood sugar two times a day 6)  Fish Oil Oil (Fish oil) .... Once daily 7)  Stool Softener 100 Mg Caps (Docusate sodium) .... 50 mg. once daily 8)  Aspirin 81 Mg Tabs (Aspirin) .... Take 1 tablet by mouth once a day 9)  Ultra Mega Nu Iron Plus  .... Once daily 10)  Lloyd Huger Med Sinus Rinse  .... Once daily 11)  Metformin Hcl 1000 Mg Tabs (Metformin hcl) .... Take 1 tablet by mouth two times a day 12)  Amaryl 4 Mg Tabs (Glimepiride) .[name].. 1 tab by mouth daily. 13)  Azithromycin 250 Mg Tabs (Azithromycin) .... Take 2 tabs by mouth  on day 1, then take 1 tab by mouth daily until completed 14)  Ventolin Hfa 108 (90 Base) Mcg/act Aers (Albuterol sulfate) .... Take 2 puffs inhaled every 3 hours as needed sob, cough, wheezing 15)  Prednisone 20 Mg Tabs (Prednisone) .... Take 2 daily x 7 days 16)  Cheratussin Ac 100-10 Mg/51ml Syrp (Guaifenesin-codeine) .... (sugar free) 1 teaspoon q6 hours as needed cough  Patient Instructions: 1)  this still sounds like bronchitis, we need to give zpack more time. 2)  continue zpack as well as ventolin. 3)  Take ventolin every 4-6 hours 2 puffs for next 2-3 days (over weekend) then just as needed. 4)  Call pharmacy to ask about sugar in cough syrup.  If not sugar free, fill  prescription provided today. 5)  Treat with prednisone for lung inflammation/reactivity. 6)  breathing treatment today. 7)  If sugars running high, you may take amaryl daily while on steroids 8)  Good to see you today, call clinic with quesitons. 9)  Return if not improving as expected or if any worsening of cough, breathing. Prescriptions: CHERATUSSIN AC 100-10 MG/5ML SYRP (GUAIFENESIN-CODEINE) (sugar free) 1 teaspoon q6 hours as needed cough  #100cc x 0   Entered and Authorized by:   Eustaquio Boyden  MD   Signed by:   Eustaquio Boyden  MD on 06/10/2010   Method used:   Print then Give to Patient   RxID:   [id number] PREDNISONE 20 MG TABS (PREDNISONE) take 2 daily x 7 days  #14 x 0   Entered and Authorized by:   Eustaquio Boyden  MD   Signed by:   Eustaquio Boyden  MD on 06/10/2010   Method used:   [facility]        [id number]       [address]       [place], [place]  [address]       Ph: [phone]       Fax: [phone]   RxID:   [id number]    Orders Added: 1)  Est. Patient Level III [87564]    Current Allergies (reviewed today): ! PENICILLIN ! BIAXIN  Appended Document: F/U [facility] ON 06/08/10/CLE   [facility]    Clinical Lists  Changes  Orders: Added new Service order of Ipratropium inhalation sol. unit dose (P3295) - Signed Added new Service order of Albuterol Sulfate Sol 1mg  unit dose (J8841) - Signed Added new Service order of Nebulizer Tx (66063) - Signed       Medication Administration  Medication # 1:    Medication: Ipratropium inhalation sol. unit dose    Diagnosis: ACUTE BRONCHITIS (ICD-466.0)    Dose: 0.5 mg    Route: inhaled    Exp Date: 06/28/2010    Lot #: [id number]    Mfr: Nephron    Comments: Per Dr. Sharen Hones    Patient tolerated medication without complications    Given by: Selena Batten Dance CMA Duncan Dull) (June 10, 2010 9:35 AM)  Medication # 2:    Medication: Albuterol Sulfate Sol 1mg  unit dose    Diagnosis: ACUTE BRONCHITIS (ICD-466.0)    Dose: 2.5 mg    Route: inhaled    Exp Date: 06/28/2010    Lot #: [id number]    Mfr: Nephron    Comments: Per Dr. Sharen Hones    Patient tolerated medication without complications    Given by: Selena Batten Dance CMA Duncan Dull) (June 10, 2010 9:36 AM)  Orders Added: 1)  Ipratropium inhalation sol. unit dose [J7644] 2)  Albuterol Sulfate Sol 1mg  unit dose [J7613] 3)  Nebulizer Tx [73220]

## 2010-07-06 ENCOUNTER — Telehealth: Payer: Self-pay | Admitting: Family Medicine

## 2010-07-12 NOTE — Progress Notes (Signed)
Summary: clarification needed for nasonex directions  Phone Note From Pharmacy   [facility] Summary of Call: [facility] is on your desk, they are asking for clarification on nasonex directions.                Lowella Petties CMA, AAMA  July 06, 2010 4:59 PM   Follow-up for Phone Call        In my box. Ruthe Mannan MD  July 07, 2010 7:09 AM  Form faxed.             Lowella Petties CMA, AAMA  July 07, 2010 8:06 AM

## 2010-09-12 ENCOUNTER — Encounter: Payer: Self-pay | Admitting: Family Medicine

## 2010-09-12 ENCOUNTER — Telehealth: Payer: Self-pay | Admitting: *Deleted

## 2010-09-12 LAB — HM PAP SMEAR

## 2010-09-12 LAB — HM MAMMOGRAPHY

## 2010-09-12 NOTE — Telephone Encounter (Signed)
Patient called for an appointment today. Patient states that she has a rash on her head, neck and buttock. Patient states that she has been using cortisone cream since Wednesday and it is not helping and rash looks worse today. Patient states that she has had this problem before and her previous physician has given her Atarax, Prednisone tablet and rx cortisone cream in the past which helped. No appointments available today. [facility],

## 2010-09-12 NOTE — Telephone Encounter (Signed)
Scheduled patient to see Dr. Dayton Martes 09/13/2010 at 10:45.  Left message on voicemail at work advising patient as instructed.  Advised her to call back if she cannot keep that appt.

## 2010-09-12 NOTE — Telephone Encounter (Signed)
I really need to see the rash before I can call in medication. She can either come in this morning (double book) or make appt for tomorrow.

## 2010-09-12 NOTE — Telephone Encounter (Signed)
Left message on voicemail of work phone for patient to return call.

## 2010-09-13 ENCOUNTER — Encounter: Payer: Self-pay | Admitting: Family Medicine

## 2010-09-13 ENCOUNTER — Ambulatory Visit (INDEPENDENT_AMBULATORY_CARE_PROVIDER_SITE_OTHER): Payer: 59 | Admitting: Family Medicine

## 2010-09-13 VITALS — BP 140/80 | HR 77 | Temp 97.6°F | Ht 64.0 in | Wt 191.4 lb

## 2010-09-13 DIAGNOSIS — L259 Unspecified contact dermatitis, unspecified cause: Secondary | ICD-10-CM

## 2010-09-13 DIAGNOSIS — L309 Dermatitis, unspecified: Secondary | ICD-10-CM

## 2010-09-13 MED ORDER — HYDROXYZINE HCL 10 MG PO TABS: 10.0000 mg | ORAL_TABLET | Freq: Three times a day (TID) | ORAL | Status: AC | PRN

## 2010-09-13 MED ORDER — PREDNISONE 20 MG PO TABS: ORAL_TABLET | ORAL | Status: DC

## 2010-09-13 MED ORDER — SELENIUM SULFIDE 1 % EX LOTN: 1.0000 "application " | TOPICAL_LOTION | Freq: Every day | CUTANEOUS | Status: DC

## 2010-09-13 NOTE — Progress Notes (Signed)
Subjective:     Whitney Knight is a 60 y.o. female who presents for evaluation of a rash involving the trunk and scalp. Rash started 5 days ago. Lesions are pink, and raised in texture. Rash has changed over time. Rash is pruritic. Associated symptoms: none. Patient denies: abdominal pain, arthralgia, congestion, fever, headache and nausea. Patient has not had contacts with similar rash. Patient has not had new exposures (soaps, lotions, laundry detergents, foods, medications, plants, insects or animals).  Gets this rash once or twice a year. Has been to two dermatologists, most recent biopsy a few years ago. Per pt, told it was some type of dermatitis.  The PMH, PSH, Social History, Family History, Medications, and allergies have been reviewed in [facility], and have been updated if relevant.  Benadryl not helping much with wheezing.  No SOB or wheezing.    Review of Systems See HPI  Objective:  BP 140/80  Pulse 77  Temp(Src) 97.6 F (36.4 C) (Oral)  Ht 5\' 4"  (1.626 m)  Wt 191 lb 6.4 oz (86.818 kg)  BMI 32.85 kg/m2   General:  alert and cooperative  Skin:  papules noted on trunk, erythematous flakes in scalp     Assessment:    dermatitis ,likely allergic. If progresses, recommend allergy referral however given infrequency of rash, we can treat as needed. Plan:    Medications: steroids: prednisone taper.  Atarax as needed for itching. Selenium sulfide shampoo.

## 2010-09-14 ENCOUNTER — Telehealth: Payer: Self-pay | Admitting: *Deleted

## 2010-09-14 NOTE — Telephone Encounter (Signed)
Yes that is definitely ok. Please have her call us back with an update in the next day or two.

## 2010-09-14 NOTE — Telephone Encounter (Signed)
Left message on voicemail at patients job advising as instructed.

## 2010-09-14 NOTE — Telephone Encounter (Signed)
Patient called and stated that Dr. Dayton Martes put her on a Prednisone taper yesterday and she took three tablets yesterday.  She checked her blood sugar before bedtime and it was 468.  She stated that she is scared and does not want to take five tablets daily of Prednisone.  Is it ok if she only takes three tablets daily?  Please advise.

## 2010-10-10 ENCOUNTER — Ambulatory Visit: Payer: 59 | Admitting: Family Medicine

## 2010-10-11 ENCOUNTER — Other Ambulatory Visit: Payer: Self-pay | Admitting: Family Medicine

## 2011-01-03 ENCOUNTER — Telehealth: Payer: Self-pay | Admitting: *Deleted

## 2011-01-03 ENCOUNTER — Other Ambulatory Visit: Payer: Self-pay | Admitting: *Deleted

## 2011-01-03 MED ORDER — LOSARTAN POTASSIUM 50 MG PO TABS: 50.0000 mg | ORAL_TABLET | Freq: Every day | ORAL | Status: DC

## 2011-01-03 NOTE — Telephone Encounter (Signed)
Received prior auth for Nasonex for Dr. Elmer Knight patient. Form is in your IN box. Thanks!

## 2011-01-03 NOTE — Telephone Encounter (Signed)
Changed to flonase as this is what was on her list.

## 2011-01-04 ENCOUNTER — Other Ambulatory Visit: Payer: Self-pay | Admitting: *Deleted

## 2011-01-04 MED ORDER — LOSARTAN POTASSIUM 50 MG PO TABS: 50.0000 mg | ORAL_TABLET | Freq: Every day | ORAL | Status: DC

## 2011-01-23 ENCOUNTER — Encounter: Payer: Self-pay | Admitting: Family Medicine

## 2011-01-23 ENCOUNTER — Ambulatory Visit (INDEPENDENT_AMBULATORY_CARE_PROVIDER_SITE_OTHER): Payer: 59 | Admitting: Family Medicine

## 2011-01-23 VITALS — BP 140/70 | HR 86 | Temp 98.4°F | Wt 187.2 lb

## 2011-01-23 DIAGNOSIS — J209 Acute bronchitis, unspecified: Secondary | ICD-10-CM

## 2011-01-23 MED ORDER — ONETOUCH ULTRA SYSTEM W/DEVICE KIT: 1.0000 | PACK | Freq: Once | Status: DC

## 2011-01-23 MED ORDER — BENZONATATE 100 MG PO CAPS: 100.0000 mg | ORAL_CAPSULE | Freq: Four times a day (QID) | ORAL | Status: DC | PRN

## 2011-01-23 MED ORDER — ALBUTEROL SULFATE HFA 108 (90 BASE) MCG/ACT IN AERS: 2.0000 | INHALATION_SPRAY | RESPIRATORY_TRACT | Status: DC | PRN

## 2011-01-23 MED ORDER — AZITHROMYCIN 250 MG PO TABS: ORAL_TABLET | ORAL | Status: AC

## 2011-01-23 NOTE — Progress Notes (Signed)
SUBJECTIVE:  Whitney Knight is a 60 y.o. female who complains of congestion, productive cough and fever for 7 days. She denies a history of myalgias, vomiting and weakness and denies a history of asthma. Patient denies smoke cigarettes.   OBJECTIVE: BP 140/70  Pulse 86  Temp(Src) 98.4 F (36.9 C) (Oral)  Wt 187 lb 4 oz (84.936 kg)  She appears well, vital signs are as noted. Ears normal.  Throat and pharynx normal.  Neck supple. No adenopathy in the neck. Nose is congested. Sinuses non tender. The chest is clear, without wheezes or rales.  ASSESSMENT:  bronchitis  PLAN: Given duration and progression of symptoms, will treat for bacterial sinusitis. Symptomatic therapy suggested: push fluids, rest and return office visit prn if symptoms persist or worsen.Call or return to clinic prn if these symptoms worsen or fail to improve as anticipated.

## 2011-01-23 NOTE — Patient Instructions (Signed)
Take antibiotic as directed.  Drink lots of fluids.  Treat sympotmatically with Mucinex, nasal saline irrigation, and Tylenol/Ibuprofen. Also try claritin D or zyrtec D over the counter- two times a day as needed ( have to sign for them at pharmacy). You can use warm compresses.  Cough suppressant at night. Call if not improving as expected in 5-7 days.    

## 2011-01-25 ENCOUNTER — Telehealth: Payer: Self-pay | Admitting: *Deleted

## 2011-01-25 MED ORDER — CHLORPHENIRAMINE-HYDROCODONE 8-10 MG/5ML PO LQCR: 5.0000 mL | Freq: Two times a day (BID) | ORAL | Status: DC | PRN

## 2011-01-25 NOTE — Telephone Encounter (Signed)
Patient was seen earlier this week and called to let Dr. Dayton Martes know that she is not getting any better.  She is coughing constantly and has to sit up at night to sleep and she hasn't been getting much sleep because she coughs so much.  The [employer] that she has is not working at all to help with the cough and the Zpak doesn't seem to be doing much either.  She is requesting a strong cough medication and another antibiotic.  Please advise.  [facility]

## 2011-01-25 NOTE — Telephone Encounter (Signed)
She does not yet needs another antibiotic.  Please give that a little more time unless she is spiking temperature with zpack.  She has multiple abx allergies and zpack has good coverage of upper resp tract. Please call in rx for tussionex as entered below.

## 2011-01-25 NOTE — Telephone Encounter (Signed)
Patient advised as instructed via telephone.  Rx for [facility] called to [facility].  She will call us back if she needs a note for work.

## 2011-01-27 ENCOUNTER — Encounter: Payer: Self-pay | Admitting: Family Medicine

## 2011-01-27 ENCOUNTER — Ambulatory Visit (INDEPENDENT_AMBULATORY_CARE_PROVIDER_SITE_OTHER): Payer: 59 | Admitting: Family Medicine

## 2011-01-27 ENCOUNTER — Telehealth: Payer: Self-pay | Admitting: *Deleted

## 2011-01-27 VITALS — BP 140/72 | HR 88 | Temp 98.8°F | Resp 20 | Ht 64.0 in | Wt 187.0 lb

## 2011-01-27 DIAGNOSIS — J029 Acute pharyngitis, unspecified: Secondary | ICD-10-CM

## 2011-01-27 DIAGNOSIS — J209 Acute bronchitis, unspecified: Secondary | ICD-10-CM

## 2011-01-27 MED ORDER — GUAIFENESIN-CODEINE 100-10 MG/5ML PO SYRP: 5.0000 mL | ORAL_SOLUTION | Freq: Four times a day (QID) | ORAL | Status: AC | PRN

## 2011-01-27 NOTE — Telephone Encounter (Signed)
Patient has appt at 4:15 pm

## 2011-01-27 NOTE — Patient Instructions (Signed)
I think you have a viral bronchitis based on the cough / length of illness and ulcers in mouth and throat Tylenol/ advil are ok for fever and throat pain Try the cheratussin for cough with caution- it may sedate Try to keep up your fluid intake Try ambesol on q tip to the oral ulcers for relief from those  Strep test is negative today  If not improved Monday call

## 2011-01-27 NOTE — Telephone Encounter (Signed)
Pt was seen on Monday and told that she has pneumonia.  She is on her last day of taking zithromax.  She is not any better.  Has fever of 100.5 today, has terribly sore throat, coughs all night.  She was given vicodin for cough but that doesn't help.  She is asking for cheritussin, which has helped in the past.  She thinks she needs a stronger antibiotic.  Also wants to know what she can do about the sore throat. [facility].  She will come back in if needed, says she has to have some relief.

## 2011-01-27 NOTE — Progress Notes (Signed)
Subjective:    Patient ID: [name], female    DOB: 1950-08-27, 60 y.o.   MRN: [id number]  HPI Here for follow up of bronchitis - seen by Dr Dayton Martes on 9/24 C/o continued very hard cough and sore throat Prod of green mucous and croupy sounding  Today that has slowed down a bit  Now the nasal symptoms are starting to abate a bit    On zithromax  Tried tussionex for cough - not working very well  In past - chertussin works better   Is able to drink a protien shake - not eating a lot , though Trying to get fluids in    100.5 temp earlier today Took some advil for fever  Feels hot now    Rapid strep test neg right now Feels like a fire in her throat and very hoarse too  Has lost 8 lb as well    Is not sleeping at all  Can only sleep sitting up   Just got back from [place] -- was on several airplanes   Patient Active Problem List  Diagnoses  . UNSPECIFIED HYPOTHYROIDISM  . DM  . VITAMIN D DEFICIENCY  . HYPERTENSION  . ACUTE BRONCHITIS  . ALLERGIC RHINITIS CAUSE UNSPECIFIED   Past Medical History  Diagnosis Date  . Hypertension   . Diabetes mellitus   . Thyroid disease   . Chronic sinusitis    Past Surgical History  Procedure Date  . Nasal sinus surgery 06/2008   History  Substance Use Topics  . Smoking status: Never Smoker   . Smokeless tobacco: Not on file  . Alcohol Use: No   Family History  Problem Relation Age of Onset  . COPD Father   . Hypertension Father    Allergies  Allergen Reactions  . Clarithromycin     REACTION: Nausea  . Penicillins     REACTION: Throat swelling   Current Outpatient Prescriptions on File Prior to Visit  Medication Sig Dispense Refill  . albuterol (VENTOLIN HFA) 108 (90 BASE) MCG/ACT inhaler Inhale 2 puffs into the lungs every 4 (four) hours as needed.  1 Inhaler  0  . aspirin 81 MG tablet Take 81 mg by mouth daily.        . Blood Glucose Monitoring Suppl (ONE TOUCH ULTRA SYSTEM KIT) W/DEVICE KIT 1 kit by Does not  apply route once.  1 each  0  . Casanthranol-Docusate Sodium 30-100 MG CAPS Take 1 tablet by mouth daily.        . fish oil-omega-3 fatty acids 1000 MG capsule Take 1 g by mouth daily.        . fluticasone (FLONASE) 50 MCG/ACT nasal spray As directed       . glimepiride (AMARYL) 4 MG tablet Take 4 mg by mouth daily before breakfast.        . glucose blood test strip (OneTouch Ultra) test strips- test blood sugar two times daily       . levothyroxine (SYNTHROID, LEVOTHROID) 75 MCG tablet Take 75 mcg by mouth daily.        [name] losartan (COZAAR) 50 MG tablet Take 1 tablet (50 mg total) by mouth daily.  30 tablet  0  . metFORMIN (GLUCOPHAGE) 1000 MG tablet Take 1,000 mg by mouth 2 (two) times daily with a meal.        . oxybutynin (DITROPAN-XL) 5 MG 24 hr tablet TAKE 1 TABLET BY MOUTH ONCE A DAY  90 tablet  2  .  azithromycin (ZITHROMAX Z-PAK) 250 MG tablet Use as directed.  6 each  0  . benzonatate (TESSALON PERLES) 100 MG capsule Take 1 capsule (100 mg total) by mouth every 6 (six) hours as needed for cough.  30 capsule  0  . Ferrous Sulfate (IRON) 325 (65 FE) MG TABS Take 1 tablet by mouth daily.        . predniSONE (DELTASONE) 20 MG tablet 5 tabs po x 2 days, 4 tabs po x 2 days, 3 tabs po x 2 days, 2 tabs po x 1 day, 1 tab po x 1 day. Dispense qs  1 tablet  0  . selenium sulfide (SELSUN) 1 % LOTN Apply 1 application topically daily.  1 Bottle  0        Review of Systems Review of Systems  Constitutional: Negative for fever, appetite change, fatigue and unexpected weight change.  Eyes: Negative for pain and visual disturbance.  ENT pos for ST, with improving nasal congestion and no sinus tenderness  Respiratory: Negative for shortness of breath. Pos for wheeze that is improving   Cardiovascular: Negative for cp or palpitations    Gastrointestinal: Negative for nausea, diarrhea and constipation.  Genitourinary: Negative for urgency and frequency.  Skin: Negative for pallor or rash     Neurological: Negative for weakness, light-headedness, numbness and headaches.  Hematological: Negative for adenopathy. Does not bruise/bleed easily.  Psychiatric/Behavioral: Negative for dysphoric mood. The patient is not nervous/anxious.          Objective:   Physical Exam  Constitutional: She appears well-developed and well-nourished. No distress.       Fatigued and coughing , but talkative   HENT:  Head: Normocephalic and atraumatic.  Right Ear: External ear normal.  Left Ear: External ear normal.       Post nasal drip No throat erythema or swelling  Nares are mildly congested  No sinus tenderness  Eyes: Conjunctivae and EOM are normal. Pupils are equal, round, and reactive to light. Right eye exhibits no discharge. Left eye exhibits no discharge.  Neck: Normal range of motion. Neck supple. No thyromegaly present.  Cardiovascular: Normal rate, regular rhythm, normal heart sounds and intact distal pulses.   Pulmonary/Chest: Effort normal. No respiratory distress. She has wheezes. She has no rales.       Harsh bs throughout Good air exch Wheeze only on forced expiration No rales  Harsh barky sounding cough  Abdominal: Soft. Bowel sounds are normal. She exhibits no distension. There is no tenderness.  Musculoskeletal: She exhibits no edema.  Lymphadenopathy:    She has no cervical adenopathy.  Neurological: She is alert. She exhibits normal muscle tone.  Skin: Skin is warm and dry. No rash noted. No erythema. No pallor.  Psychiatric: She has a normal mood and affect.          Assessment & Plan:

## 2011-01-27 NOTE — Assessment & Plan Note (Signed)
Suspect viral - given completed zithromax and oral ulcers  Less wheezing and less productive at this point Rapid strep neg- with oral and throat apthous ulcers  Will change cough med to cheratussin -which has worked well for her in the past Fluids/ rest / fever care  Will try ambesol gel otc for the oral ulcers  Update if worse/ wheezing/or no imp by [employer]

## 2011-01-27 NOTE — Telephone Encounter (Signed)
I think my 4:15 may be open-please put her in for that or other doc if any openings

## 2011-02-13 ENCOUNTER — Ambulatory Visit (INDEPENDENT_AMBULATORY_CARE_PROVIDER_SITE_OTHER)
Admission: RE | Admit: 2011-02-13 | Discharge: 2011-02-13 | Disposition: A | Discharge status: Home / Self Care | Payer: 59 | Source: Ambulatory Visit | Attending: Family Medicine | Admitting: Family Medicine

## 2011-02-13 ENCOUNTER — Ambulatory Visit (INDEPENDENT_AMBULATORY_CARE_PROVIDER_SITE_OTHER): Payer: 59 | Admitting: Family Medicine

## 2011-02-13 ENCOUNTER — Encounter: Payer: Self-pay | Admitting: Family Medicine

## 2011-02-13 VITALS — BP 128/82 | HR 88 | Temp 98.5°F | Wt 181.5 lb

## 2011-02-13 DIAGNOSIS — R059 Cough, unspecified: Secondary | ICD-10-CM

## 2011-02-13 DIAGNOSIS — R05 Cough: Secondary | ICD-10-CM

## 2011-02-13 MED ORDER — PREDNISONE 20 MG PO TABS: ORAL_TABLET | ORAL | Status: DC

## 2011-02-13 MED ORDER — ALBUTEROL SULFATE HFA 108 (90 BASE) MCG/ACT IN AERS: 2.0000 | INHALATION_SPRAY | RESPIRATORY_TRACT | Status: DC | PRN

## 2011-02-13 MED ORDER — LEVOFLOXACIN 500 MG PO TABS: 500.0000 mg | ORAL_TABLET | Freq: Every day | ORAL | Status: AC

## 2011-02-13 NOTE — Assessment & Plan Note (Addendum)
Going on 3 wks.  Very reactive sounding.  However no wheezing on exam. CXR checked today - overall clear, ?hyperaeration. Previously improved after course of steroids.  Will treat with another course steroids.  Continue cheratussin. If not improved, return for further evaluation.  Given productive cough as well as slowly returning fever, cover with levaquin (WASP in case prednisone not helping). 2nd case of bad bronchitis in a year, wonder if will need inhaled corticosteroids. No h/o asthma, COPD, etc though. Recommend return in 2-3 months for spirometry once feeling better.

## 2011-02-13 NOTE — Patient Instructions (Addendum)
Chest xray looking overall ok. I'd continue cheratussin and albuterol. I'll treat you with another course of steroids given reactive nature of cough. If not improving with steroids, or any fever >101, or worsening productive cough, take antibiotic (levaquin). I'd like you to return in 2-3 months for spirometry to check lung function.

## 2011-02-13 NOTE — Progress Notes (Signed)
**Note Whitney-Identified via Obfuscation**   Subjective:    Patient ID: Whitney Knight, female    DOB: 1950-12-30, 60 y.o.   MRN: [id number]  HPI CC: cough continued  Whitney Knight is a 60 yo DM who presents with cough.  Seen 01/23/2011, dx with bronchitis/sinusitis and treated with zpack, albuterol.  Seen 01/27/2011 here again and thought likely viral bronchitis given mouth ulcers as well, continued albuterol and cheratussin.    Now continued cough.  Yesterday with temp to 99.5.  Ulcers resolved.  Continued horrible cough, trouble catching breath.  Throat sore unable to eat.  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  Has lost weight with this illness.  Mildly productive cough of green sputum more in am and at night.  Recent trip to [place]. PCN, biaxin allergy. Cheratussin works better than tussionex for cough for pt. No h/o asthma, COPD, no smokers at home.  Seen by myself 06/2010 with significant reactivity noted on exam, treated with prednisone and improved, however did shoot cbg's up to 300s.  Whitney Knight is on amaryl only PRN for her DM. Whitney Knight is on ARB, not ACEI.  Review of Systems Per HPI    Objective:   Physical Exam  Nursing note and vitals reviewed. Constitutional: Whitney Knight appears well-developed and well-nourished. No distress.  HENT:  Head: Normocephalic and atraumatic.  Right Ear: Hearing, tympanic membrane, external ear and ear canal normal.  Left Ear: Hearing, tympanic membrane, external ear and ear canal normal.  Nose: No mucosal edema or rhinorrhea. Right sinus exhibits no maxillary sinus tenderness and no frontal sinus tenderness. Left sinus exhibits no maxillary sinus tenderness and no frontal sinus tenderness.  Mouth/Throat: Uvula is midline and mucous membranes are normal. No oropharyngeal exudate, posterior oropharyngeal edema, posterior oropharyngeal erythema or tonsillar abscesses.  Eyes: Conjunctivae and EOM are normal. Pupils are equal, round, and reactive to light. No scleral icterus.  Neck: Normal  range of motion. Neck supple. No JVD present. No thyromegaly present.  Cardiovascular: Normal rate, regular rhythm, normal heart sounds and intact distal pulses.   No murmur heard. Pulmonary/Chest: Effort normal and breath sounds normal. No respiratory distress. Whitney Knight has no decreased breath sounds. Whitney Knight has no wheezes. Whitney Knight has no rhonchi. Whitney Knight has no rales.       Reactive cough present, sometimes with fits  Lymphadenopathy:    Whitney Knight has no cervical adenopathy.  Skin: Skin is warm and dry. No rash noted.      Assessment & Plan:

## 2011-02-17 ENCOUNTER — Other Ambulatory Visit: Payer: Self-pay | Admitting: Family Medicine

## 2011-02-27 ENCOUNTER — Telehealth: Payer: Self-pay | Admitting: Family Medicine

## 2011-02-27 NOTE — Telephone Encounter (Signed)
Spoke with patient, follow up appt made for tomorrow.

## 2011-02-27 NOTE — Telephone Encounter (Signed)
Pt was seen on 9/21 and other times since; last seen on 10/15 for bronchitis.  Patient still doesn't feel completely well.  She is calling to ask what to do.  She said that the cough has not gone away; it's hard to breathe; throat swollen; feels like she is choking when eating; tight chest.  She should get flu shot at work Nov 1st and is asking if she should get it with current symptoms.  Should she be seen again with current symptoms?  Patient would like a call back at [phone] before 5pm.  If after 5pm, she can be called at [phone] (cell).

## 2011-02-28 ENCOUNTER — Ambulatory Visit (INDEPENDENT_AMBULATORY_CARE_PROVIDER_SITE_OTHER): Payer: 59 | Admitting: Family Medicine

## 2011-02-28 ENCOUNTER — Encounter: Payer: Self-pay | Admitting: Family Medicine

## 2011-02-28 VITALS — BP 120/80 | HR 72 | Temp 97.7°F | Ht 64.0 in | Wt 183.0 lb

## 2011-02-28 DIAGNOSIS — J683 Other acute and subacute respiratory conditions due to chemicals, gases, fumes and vapors: Secondary | ICD-10-CM

## 2011-02-28 DIAGNOSIS — J45909 Unspecified asthma, uncomplicated: Secondary | ICD-10-CM

## 2011-02-28 DIAGNOSIS — J209 Acute bronchitis, unspecified: Secondary | ICD-10-CM

## 2011-02-28 MED ORDER — FLUTICASONE-SALMETEROL 115-21 MCG/ACT IN AERO: 2.0000 | INHALATION_SPRAY | Freq: Two times a day (BID) | RESPIRATORY_TRACT | Status: DC

## 2011-02-28 MED ORDER — DEXAMETHASONE SODIUM PHOSPHATE 10 MG/ML IJ SOLN
10.0000 mg | Freq: Once | INTRAMUSCULAR | Status: AC
  Administered 2011-02-28: 10 mg via INTRAMUSCULAR

## 2011-02-28 NOTE — Progress Notes (Signed)
Addended by: Gilmer Mor on: 02/28/2011 08:01 AM   Modules accepted: Orders

## 2011-02-28 NOTE — Progress Notes (Signed)
**Note Whitney-Identified via Obfuscation**   Subjective:    Patient ID: Whitney Knight, female    DOB: [birth year], 59 y.o.   MRN: [id number]  HPI CC: cough continued  Mrs Decaire is a 60 yo DM who presents with cough.  Seen 01/23/2011, dx with bronchitis/sinusitis and treated with zpack, albuterol.  Seen 01/27/2011 here again and thought likely viral bronchitis given mouth ulcers as well, continued albuterol and cheratussin.   Seen again 02/13/2011, given Levaquin and second course of prednisone.    CXR neg on 10/15.  Barky coughs continues.  Has not had a fever in weeks.     Continued horrible cough, trouble catching breath.  Has tickle in throat, dry food causes coughing fits.  Mildly productive cough of green sputum more in am.  No h/o asthma, COPD, no smokers at home.   Review of Systems Per HPI    Objective:   Physical Exam  BP 120/80  Pulse 72  Temp(Src) 97.7 F (36.5 C) (Oral)  Ht 5\' 4"  (1.626 m)  Wt 183 lb (83.008 kg)  BMI 31.41 kg/m2  Constitutional: She appears well-developed and well-nourished. No distress.  HENT:  Head: Normocephalic and atraumatic.  Right Ear: Hearing, tympanic membrane, external ear and ear canal normal.  Left Ear: Hearing, tympanic membrane, external ear and ear canal normal.  Nose: No mucosal edema or rhinorrhea. Right sinus exhibits no maxillary sinus tenderness and no frontal sinus tenderness. Left sinus exhibits no maxillary sinus tenderness and no frontal sinus tenderness.  Mouth/Throat: Uvula is midline and mucous membranes are normal. No oropharyngeal exudate, posterior oropharyngeal edema, posterior oropharyngeal erythema or tonsillar abscesses.  Eyes: Conjunctivae and EOM are normal. Pupils are equal, round, and reactive to light. No scleral icterus.  Neck: Normal range of motion. Neck supple. No JVD present. No thyromegaly present.  Cardiovascular: Normal rate, regular rhythm, normal heart sounds and intact distal pulses.   No murmur heard. Pulmonary/Chest: Effort normal and  breath sounds normal. No respiratory distress. She has no decreased breath sounds. She has no wheezes. She has no rhonchi. She has no rales.       Reactive cough present, sometimes with fits  Lymphadenopathy:    She has no cervical adenopathy.  Skin: Skin is warm and dry. No rash noted.      Assessment & Plan:   1. Acute bronchitis   Unchanged and has been having symptoms for over a month now.   CXR neg on 10/15 and sounds like RAD but not wheezes on exam.   No h/o asthma, COPD, etc though but will try inhaled steroid given the reactive nature of her cough and symptoms.

## 2011-02-28 NOTE — Patient Instructions (Signed)
Please use the Advair- two puffs twice daily. Please call me in 2-3 days with an update. Stop by to see Shirlee Limerick on your way out.

## 2011-03-03 ENCOUNTER — Encounter: Payer: Self-pay | Admitting: Pulmonary Disease

## 2011-03-03 ENCOUNTER — Ambulatory Visit (INDEPENDENT_AMBULATORY_CARE_PROVIDER_SITE_OTHER): Payer: 59 | Admitting: Pulmonary Disease

## 2011-03-03 DIAGNOSIS — R0982 Postnasal drip: Secondary | ICD-10-CM

## 2011-03-03 DIAGNOSIS — R05 Cough: Secondary | ICD-10-CM

## 2011-03-03 DIAGNOSIS — J329 Chronic sinusitis, unspecified: Secondary | ICD-10-CM

## 2011-03-03 NOTE — Patient Instructions (Addendum)
Chronic cough, or cough lasting longer than 8 weeks, can be caused by many different conditions, including smoking, asthma, sinus congestion/runny nose, acid reflux or medications.  We believe that your cough is due to viral bronchitis with residual airway inflammation, worsened by sinus drainage.  We recommend taking your cough syrup at home with codeine and the following over the counter medications:  -use Lloyd Huger Med sinus rinses twice a day -use Chlor-Trimeton  Swallowing water or using ice chips/non mint and menthol containing candies (such as lifesavers or sugarless jolly ranchers) are also effective. You should rest your voice and avoid activities that you know make you cough.   Once you have eliminated the cough for 3 straight days try reducing the cough syrup with codeine as tolerated.   Continue taking the Advair for one more week, then use only daily for 3-4 days, then stop all together.  Follow up with [place] in 4 weeks.

## 2011-03-03 NOTE — Progress Notes (Signed)
Subjective:    Patient ID: [name], female    DOB: 02/21/1951, 60 y.o.   MRN: [id number]  HPI 60 y/o female with no prior pulmonary history presents to our clinic for evaluation and management of 6 weeks of cough.  She states that she was a healthy child and had not pulmonary problems throughout adulthood until the development of this cough.  She notes two episodes of bronchitis throughout adulthood and never smoked cigarettes.  She states that she is active at baseline and typically her activity is not limited by dyspnea.  However six weeks ago she went to [place] on vacation and on the return flight developed a cough with some dyspnea.  This was associated with fever for two days, clear sputum production, sinus congestion, sore throat.  She was seen by her PCP who prescribed a zpack.  However 5 days later she noted no improvement.  She was seen again and was told that she had viral bronchitis and symptomatic therapy was prescribed.  The cough persisted and about 3-4 weeks into the illness she was given Levaquin.  During illness, she has used dextromethorphan, benzonatate, prednisone, Advair, tussin diabetic, cough syrup with codeine, and proAir.  Of these, the codeine was the most effective.  The cough has been persistent throughout the illness and botherre her day and night.  She typically produces sputum in the morning after waking up.  She has also noted sore throat.    Oe 10/30 she was given Advair and decadron.  She says that since starting those she feels somewhat better, specifically her throat pain has improved.  She notes chronic sinus congestion and post nasal drip which has required treatment in the past.  She denies significant reflux symptoms typically, though she did have heartburn last night after eating chili.  It is not typically a problem for her.    Review of Systems  Constitutional: Negative for fever, chills and unexpected weight change.  HENT: Positive for congestion,  sneezing, trouble swallowing and postnasal drip. Negative for ear pain, nosebleeds, sore throat, rhinorrhea, dental problem, voice change and sinus pressure.   Eyes: Negative for visual disturbance.  Respiratory: Positive for cough, choking and shortness of breath.   Cardiovascular: Negative for chest pain and leg swelling.  Gastrointestinal: Negative for vomiting, abdominal pain and diarrhea.  Genitourinary: Negative for difficulty urinating.  Musculoskeletal: Negative for arthralgias.  Skin: Negative for rash.  Neurological: Positive for headaches. Negative for tremors and syncope.  Hematological: Does not bruise/bleed easily.    Past Medical History  Diagnosis Date  . Hypertension   . Diabetes mellitus   . Thyroid disease   . Chronic sinusitis      Family History  Problem Relation Age of Onset  . COPD Father   . Hypertension Father      History   Social History  . Marital Status: Married    Spouse Name: N/A    Number of Children: N/A  . Years of Education: N/A   Occupational History  . [employer]    Social History Main Topics  . Smoking status: Never Smoker   . Smokeless tobacco: Never Used  . Alcohol Use: Yes     occ  . Drug Use: No  . Sexually Active: Not on file   Other Topics Concern  . Not on file   Social History Narrative  . No narrative on file     Allergies  Allergen Reactions  . Clarithromycin     REACTION:  Nausea  . Penicillins     REACTION: Throat swelling     Outpatient Prescriptions Prior to Visit  Medication Sig Dispense Refill  . albuterol (VENTOLIN HFA) 108 (90 BASE) MCG/ACT inhaler Inhale 2 puffs into the lungs every 4 (four) hours as needed.  1 Inhaler  0  . aspirin 81 MG tablet Take 81 mg by mouth daily.        Jennette Banker Sodium 30-100 MG CAPS Take 1 tablet by mouth daily.        . fish oil-omega-3 fatty acids 1000 MG capsule Take 1 g by mouth daily.        . fluticasone (FLONASE) 50 MCG/ACT nasal spray  As directed       . fluticasone-salmeterol (ADVAIR HFA) 115-21 MCG/ACT inhaler Inhale 2 puffs into the lungs 2 (two) times daily.  1 Inhaler  12  . glimepiride (AMARYL) 4 MG tablet Take 4 mg by mouth daily before breakfast.        . levothyroxine (SYNTHROID, LEVOTHROID) 75 MCG tablet Take 75 mcg by mouth daily.        [name] losartan (COZAAR) 50 MG tablet TAKE 1 TABLET BY MOUTH ONCE A DAY  90 tablet  1  . metFORMIN (GLUCOPHAGE) 1000 MG tablet Take 1,000 mg by mouth 2 (two) times daily with a meal.        . oxybutynin (DITROPAN-XL) 5 MG 24 hr tablet TAKE 1 TABLET BY MOUTH ONCE A DAY  90 tablet  2  . Blood Glucose Monitoring Suppl (ONE TOUCH ULTRA SYSTEM KIT) W/DEVICE KIT 1 kit by Does not apply route once.  1 each  0  . Ferrous Sulfate (IRON) 325 (65 FE) MG TABS Take 1 tablet by mouth daily.        [name] glucose blood test strip (OneTouch Ultra) test strips- test blood sugar two times daily       . selenium sulfide (SELSUN) 1 % LOTN Apply 1 application topically daily.  1 Bottle  0       Objective:   Physical Exam Filed Vitals:   03/03/11 1435  BP: 124/82  Pulse: 100  Temp: 97.9 F (36.6 C)  O2 98% RA  Gen: well appearing but coughing frequently HEENT: NCAT, PERRL, EOMi, OP with mucus draining from nasopharynx, neck supple without masses, PULM: Frequent coughing but CTA B, no wheeze CV: RRR, no mgr, no JVD AB: BS+, soft, nontender, no hsm Ext: warm, no edema, no clubbing, no cyanosis Derm: no rash or skin breakdown Neuro: A&Ox4, CN II-XII intact, strength 5/5 in all 4 extremeties  02/13/11 Chest X-ray reviewed, no pneumonia or acute pulmonary process     Assessment & Plan:   Cough Ms. Knudsen presents with 6 weeks of persistent cough after what sounds like a viral URI.  Per ACCP guidelines, she still has acute cough and is slowly recovering.  Notably, she doesn't have chronic respiratory symptoms that would suggest asthma or COPD at baseline.  She is not wheezing and she doesn't think  that she has throughout this illness.  I suspect that she had a particularly nast viral bronchitis that led to persistent inflammation causing her cough. This is further aggravated by her ongoing post nasal drip from chronic rhinitis.  The differential diagnosis would include post viral bronchial hyperreactivity, GERD, and pertussis.  She is not wheezing, does not have significant reflux, and was treated with azithromycin 6 weeks ago so further testing or treatment of pertussis is not warranted at this  point.  I do not believe that she has asthma so further inhaled or oral steroids are not likely to benefit.  Our management at this point should focus on her post nasal drip and symptomatic therapy for cough.  1) Cough:   -use home cough syrup with codeine consistently over the weekend, no driving after using this  -use chlor-trimeton, hold zyrtec while taking this  -stop advair in one week  2) Rhinitis:  -Lloyd Huger Med rinses  -chlor-trimeton  RTC 4 weeks    Updated Medication List Outpatient Encounter Prescriptions as of 03/03/2011  Medication Sig Dispense Refill  . albuterol (VENTOLIN HFA) 108 (90 BASE) MCG/ACT inhaler Inhale 2 puffs into the lungs every 4 (four) hours as needed.  1 Inhaler  0  . aspirin 81 MG tablet Take 81 mg by mouth daily.        Jennette Banker Sodium 30-100 MG CAPS Take 1 tablet by mouth daily.        . fish oil-omega-3 fatty acids 1000 MG capsule Take 1 g by mouth daily.        . fluticasone (FLONASE) 50 MCG/ACT nasal spray As directed       . fluticasone-salmeterol (ADVAIR HFA) 115-21 MCG/ACT inhaler Inhale 2 puffs into the lungs 2 (two) times daily.  1 Inhaler  12  . glimepiride (AMARYL) 4 MG tablet Take 4 mg by mouth daily before breakfast.        . levothyroxine (SYNTHROID, LEVOTHROID) 75 MCG tablet Take 75 mcg by mouth daily.        [name] losartan (COZAAR) 50 MG tablet TAKE 1 TABLET BY MOUTH ONCE A DAY  90 tablet  1  . metFORMIN (GLUCOPHAGE) 1000 MG tablet  Take 1,000 mg by mouth 2 (two) times daily with a meal.        . oxybutynin (DITROPAN-XL) 5 MG 24 hr tablet TAKE 1 TABLET BY MOUTH ONCE A DAY  90 tablet  2  . DISCONTD: Blood Glucose Monitoring Suppl (ONE TOUCH ULTRA SYSTEM KIT) W/DEVICE KIT 1 kit by Does not apply route once.  1 each  0  . DISCONTD: Ferrous Sulfate (IRON) 325 (65 FE) MG TABS Take 1 tablet by mouth daily.        [name] DISCONTD: glucose blood test strip (OneTouch Ultra) test strips- test blood sugar two times daily       . DISCONTD: selenium sulfide (SELSUN) 1 % LOTN Apply 1 application topically daily.  1 Bottle  0

## 2011-03-03 NOTE — Assessment & Plan Note (Addendum)
Ms. Mandile presents with 6 weeks of persistent cough after what sounds like a viral URI.  Per ACCP guidelines, she still has acute cough and is slowly recovering.  Notably, she doesn't have chronic respiratory symptoms that would suggest asthma or COPD at baseline.  She is not wheezing and she doesn't think that she has throughout this illness.  I suspect that she had a particularly nast viral bronchitis that led to persistent inflammation causing her cough. This is further aggravated by her ongoing post nasal drip from chronic rhinitis.  The differential diagnosis would include post viral bronchial hyperreactivity, GERD, and pertussis.  She is not wheezing, does not have significant reflux, and was treated with azithromycin 6 weeks ago so further testing or treatment of pertussis is not warranted at this point.  I do not believe that she has asthma so further inhaled or oral steroids are not likely to benefit.  Our management at this point should focus on her post nasal drip and symptomatic therapy for cough.  1) Cough:   -use home cough syrup with codeine consistently over the weekend, no driving after using this  -use chlor-trimeton, hold zyrtec while taking this  -stop advair in one week  2) Rhinitis:  -Lloyd Huger Med rinses  -chlor-trimeton  RTC 4 weeks

## 2011-03-08 ENCOUNTER — Telehealth: Payer: Self-pay | Admitting: Pulmonary Disease

## 2011-03-08 MED ORDER — GUAIFENESIN-CODEINE 100-10 MG/5ML PO SYRP: 5.0000 mL | ORAL_SOLUTION | Freq: Three times a day (TID) | ORAL | Status: AC | PRN

## 2011-03-08 MED ORDER — AZELASTINE HCL 0.1 % NA SOLN: 2.0000 | Freq: Two times a day (BID) | NASAL | Status: DC

## 2011-03-08 NOTE — Telephone Encounter (Signed)
Called and spoke with Dr Kendrick Fries. He states okay to refill the cough syrup for her x 1 only. I have scheduled her for his next first available which is 03/20/11 at 2:30 and advised that she call us sooner if cough persists/worsens or seek emergency care in the meantime if needed. Pt verbalized understanding. Rx was called to pharm.

## 2011-03-08 NOTE — Telephone Encounter (Signed)
Pt says Dr. Kendrick Fries changed her medications at [facility] on 03/03/11 and her cough is worse than before. She says she is not sleeping as well on the Chlortrimeton and she needs a refill on the Cheratussin. She is still doing sinus rinses twice daily. Will forward msg to our doc of the day, Dr. Shelle Iron, for recs. Pls advise. Allergies  Allergen Reactions  . Clarithromycin     REACTION: Nausea  . Penicillins     REACTION: Throat swelling

## 2011-03-08 NOTE — Telephone Encounter (Signed)
Called patient to f/u on cough.  Left message to let her know that I'm also calling in Astelin.

## 2011-03-08 NOTE — Telephone Encounter (Signed)
LMOMTCB x 1 

## 2011-03-08 NOTE — Telephone Encounter (Signed)
Ok to refill cheratuss times one.   Have her minimize voice use, no singing/yelling/talking on phone Hard candy (no mint or cough drops) from sunup to sundown to soothe throat, NO throat clearing. Continue with chlorpheniramine. Please send a copy of this message to Dr. Kendrick Fries

## 2011-03-16 ENCOUNTER — Other Ambulatory Visit: Payer: Self-pay | Admitting: Family Medicine

## 2011-03-20 ENCOUNTER — Ambulatory Visit (INDEPENDENT_AMBULATORY_CARE_PROVIDER_SITE_OTHER): Payer: 59 | Admitting: Pulmonary Disease

## 2011-03-20 ENCOUNTER — Encounter: Payer: Self-pay | Admitting: Pulmonary Disease

## 2011-03-20 VITALS — BP 122/70 | HR 83 | Temp 98.3°F | Ht 64.0 in | Wt 179.0 lb

## 2011-03-20 DIAGNOSIS — R05 Cough: Secondary | ICD-10-CM

## 2011-03-20 MED ORDER — LEVOFLOXACIN 500 MG PO TABS: 500.0000 mg | ORAL_TABLET | Freq: Every day | ORAL | Status: DC

## 2011-03-20 NOTE — Patient Instructions (Signed)
Start taking your Nasonex again along with the sinus rinses, and the astelin.  Call Dr. Erline Hau for a follow up appointment for sinusitis.  We will call in Levaquin for one week for you (500mg  by mouth daily for one week)  Start taking Pepcid AC Maximum strength twice a day, this can be purchased over the counter.  Please refer to the GERD diet and lifestyle modification booklet.  We will see you in one week.

## 2011-03-20 NOTE — Assessment & Plan Note (Signed)
Given her improvement with Astelin I am even further convinced that this is related to her chronic sinusitis.  I think that her symptoms recurred because she stopped the nasonex.  The green color and sinus pressure are worrisome for sinusitis, which she says responds to Levaquin.    For her sinusitis, I have written Levaquin and advised that she call Dr. Erline Hau for a follow up appointment for this (she has seen him multiple times previously for this).  I have also advised that she restart the nasonex and the zyrtec.  We will also have her start taking Pepcid AC OTC bid and have given her lifestyle modification education given the worsening cough after eating.    We will see her back in one week given the persistence of the cough.

## 2011-03-20 NOTE — Progress Notes (Signed)
**Note Whitney-Identified via Obfuscation** Subjective:    Patient ID: Whitney Knight, female    DOB: [birth year], 60 y.o.   MRN: [id number]  Synopsis: This is a 60 y/o female who developed a cough after a trip to [place] in 12/2010.  She was treated with azithromycn, levaquin, prednisone, advair, decadron, codeine, and proAir in multiple office visits.  She noted chronic post nasal drip, no prior lung disease, and no history of smoking.  In our 03/03/2011 initial office visit we advised her to continue using the codeine and to increase therapy focused on her post nasal drip.   She called about a week later, and we added Astelin.  HPI She says that when she started taking the astelin she experienced a dramatic decrease in her cough nearly immediately.  However, she was confused and thought that she should stop her Nasonex.  After one week her cough returned.  In the last week she has had increasing pressure in her sinuses and green drainage.   She has also noted that her cough is worse after eating, but she doesn't choke on food or think that it goes down the wrong pipe.   Review of Systems  Constitutional: Negative for fever, chills and unexpected weight change.  HENT: Positive for congestion, rhinorrhea, trouble swallowing, postnasal drip and sinus pressure. Negative for ear pain, nosebleeds, sore throat, sneezing, dental problem and voice change.   Eyes: Negative for visual disturbance.  Respiratory: Positive for cough and shortness of breath. Negative for choking.   Cardiovascular: Negative for chest pain and leg swelling.  Gastrointestinal: Negative for vomiting, abdominal pain and diarrhea.  Genitourinary: Negative for difficulty urinating.  Musculoskeletal: Negative for arthralgias.  Skin: Negative for rash.  Neurological: Positive for headaches. Negative for tremors and syncope.  Hematological: Does not bruise/bleed easily.       Objective:   Physical Exam  Blood pressure 122/70, pulse 83, temperature 98.3 F (36.8 C),  temperature source Oral, height 5\' 4"  (1.626 m), weight 179 lb (81.194 kg), SpO2 97.00%. There were no vitals filed for this visit.  Gen: well appearing, no acute distress HEENT: NCAT, PERRL, EOMi, OP clear, neck supple without masses; nasal mucosa dry, erythematous, inflammed, TM's clear, slight post nasal drip noted PULM: Frequent coughing but no stridor, no wheezing. CV: RRR, no mgr, no JVD AB: BS+, soft, nontender, no hsm Ext: warm, no edema, no clubbing, no cyanosis Derm: no rash or skin breakdown Neuro: A&Ox4, CN II-XII intact, strength 5/5 in all 4 extremeties    Assessment & Plan:   Impression: 1) Cough due to sinusitis 2) Acute on chronic sinusitis 3) Possible GERD  Cough Given her improvement with Astelin I am even further convinced that this is related to her chronic sinusitis.  I think that her symptoms recurred because she stopped the nasonex.  The green color and sinus pressure are worrisome for sinusitis, which she says responds to Levaquin.    For her sinusitis, I have written Levaquin and advised that she call Dr. Erline Hau for a follow up appointment for this (she has seen him multiple times previously for this).  I have also advised that she restart the nasonex and the zyrtec.  We will also have her start taking Pepcid AC OTC bid and have given her lifestyle modification education given the worsening cough after eating.    We will see her back in one week given the persistence of the cough.    Updated Medication List Outpatient Encounter Prescriptions as of 03/20/2011  Medication  Sig Dispense Refill  . albuterol (VENTOLIN HFA) 108 (90 BASE) MCG/ACT inhaler Inhale 2 puffs into the lungs every 4 (four) hours as needed.  1 Inhaler  0  . aspirin 81 MG tablet Take 81 mg by mouth daily.        [name] azelastine (ASTELIN) 137 MCG/SPRAY nasal spray Place 2 sprays into the nose 2 (two) times daily. Use in each nostril as directed  30 mL  2  . Casanthranol-Docusate Sodium  30-100 MG CAPS Take 1 tablet by mouth daily.        . fish oil-omega-3 fatty acids 1000 MG capsule Take 1 g by mouth daily.        [name] glimepiride (AMARYL) 4 MG tablet TAKE 1 TABLET BY MOUTH DAILY  90 tablet  3  . levothyroxine (SYNTHROID, LEVOTHROID) 75 MCG tablet Take 75 mcg by mouth daily.        [name] losartan (COZAAR) 50 MG tablet TAKE 1 TABLET BY MOUTH ONCE A DAY  90 tablet  3  . metFORMIN (GLUCOPHAGE) 1000 MG tablet Take 1,000 mg by mouth 2 (two) times daily with a meal.        . oxybutynin (DITROPAN-XL) 5 MG 24 hr tablet TAKE 1 TABLET BY MOUTH ONCE A DAY  90 tablet  2  . levofloxacin (LEVAQUIN) 500 MG tablet Take 1 tablet (500 mg total) by mouth daily.  7 tablet  0  . DISCONTD: fluticasone (FLONASE) 50 MCG/ACT nasal spray As directed       . DISCONTD: fluticasone-salmeterol (ADVAIR HFA) 115-21 MCG/ACT inhaler Inhale 2 puffs into the lungs 2 (two) times daily.  1 Inhaler  12

## 2011-03-21 ENCOUNTER — Telehealth: Payer: Self-pay | Admitting: Pulmonary Disease

## 2011-03-21 MED ORDER — LEVOFLOXACIN 500 MG PO TABS: 500.0000 mg | ORAL_TABLET | Freq: Every day | ORAL | Status: AC

## 2011-03-21 NOTE — Telephone Encounter (Signed)
I spoke with pt and she states her Levaquin rx was sent to wrong pharmacy. I looked [facility]. I advised [facility] [place] and will [facility] to [facility] rx. [facility] [place] and [facility] the one at [facility]. Pt is aware

## 2011-03-29 ENCOUNTER — Ambulatory Visit: Payer: 59 | Admitting: Pulmonary Disease

## 2011-04-05 ENCOUNTER — Ambulatory Visit: Payer: Self-pay | Admitting: Unknown Physician Specialty

## 2011-04-20 ENCOUNTER — Ambulatory Visit: Payer: Self-pay | Admitting: Unknown Physician Specialty

## 2011-07-18 ENCOUNTER — Encounter: Payer: Self-pay | Admitting: Family Medicine

## 2011-07-18 ENCOUNTER — Ambulatory Visit (INDEPENDENT_AMBULATORY_CARE_PROVIDER_SITE_OTHER): Payer: 59 | Admitting: Family Medicine

## 2011-07-18 VITALS — BP 138/82 | HR 80 | Temp 97.5°F | Wt 183.0 lb

## 2011-07-18 DIAGNOSIS — E039 Hypothyroidism, unspecified: Secondary | ICD-10-CM

## 2011-07-18 DIAGNOSIS — E119 Type 2 diabetes mellitus without complications: Secondary | ICD-10-CM

## 2011-07-18 DIAGNOSIS — I10 Essential (primary) hypertension: Secondary | ICD-10-CM

## 2011-07-18 MED ORDER — OXYBUTYNIN CHLORIDE ER 5 MG PO TB24: ORAL_TABLET | ORAL | Status: DC

## 2011-07-18 MED ORDER — LOSARTAN POTASSIUM 50 MG PO TABS: ORAL_TABLET | ORAL | Status: DC

## 2011-07-18 MED ORDER — GLIMEPIRIDE 4 MG PO TABS: ORAL_TABLET | ORAL | Status: DC

## 2011-07-18 MED ORDER — LEVOTHYROXINE SODIUM 75 MCG PO TABS: 75.0000 ug | ORAL_TABLET | Freq: Every day | ORAL | Status: DC

## 2011-07-18 NOTE — Patient Instructions (Signed)
Good to see you. We will call you with your lab results.   

## 2011-07-18 NOTE — Progress Notes (Signed)
61 yo here for follow up.  1. DM- On Metoformin 1000 mg two times a day and Amaryl 4 mg daily.  Checks CBGs daily, running between 100-250.   Denies any episodes of hypoglycemia.  Has been overdue for a1c but was taking prednisone and declined checking it previously.  2. Hypothyoidism-   Has not had TSH checked in over a year. Denies any symptoms of hypo or hyperthyroidism.  3. HTN- has been controlled on Losartan.  BP Readings from Last 3 Encounters:  07/18/11 138/82  03/20/11 122/70  03/03/11 124/82      Review of Systems  See HPI  No CP, SOB, no LE    .  Physical Exam  BP 138/82  Pulse 80  Temp(Src) 97.5 F (36.4 C) (Oral)  Wt 183 lb (83.008 kg)  Wt Readings from Last 3 Encounters:  07/18/11 183 lb (83.008 kg)  03/20/11 179 lb (81.194 kg)  03/03/11 175 lb (79.379 kg)    General: Well-developed,well-nourished,in no acute distress; alert,appropriate and cooperative throughout examination, obese  Head: normocephalic and atraumatic.  Eyes: vision grossly intact, pupils equal, pupils round, and pupils reactive to light.  Ears: R ear normal and L ear normal.  Nose: no external deformity.  Mouth: good dentition.  Neck: No deformities, masses, or tenderness noted.  Lungs: Normal respiratory effort, chest expands symmetrically. Lungs are clear to auscultation, no crackles or wheezes.  Heart: Normal rate and regular rhythm. S1 and S2 normal without gallop, murmur, click, rub or other extra sounds.  Extremities: No clubbing, cyanosis, edema, or deformity noted with normal full range of motion of all joints.  Neurologic: alert & oriented X3 and gait normal.  Skin: Intact without suspicious lesions or rashes  Psych: Oriented X3, not anxious appearing, and not depressed appearing.   Assessment and Plan: 1. HYPERTENSION   Stable.  Continue current dose of losartan. Recheck CMET today. Comprehensive metabolic panel  2. DM  Due for a1c- had been declining due to  prednisone use but now off pred for over 2 months. Will recheck today. HgB A1c  3. Unspecified hypothyroidism  Recheck TSH today. TSH

## 2011-07-19 LAB — COMPREHENSIVE METABOLIC PANEL
Albumin/Globulin Ratio: 1.5 (ref 1.1–2.5)
Albumin: 4 g/dL (ref 3.5–5.5)
Alkaline Phosphatase: 119 IU/L (ref 25–150)
BUN/Creatinine Ratio: 13 (ref 9–23)
BUN: 11 mg/dL (ref 6–24)
Chloride: 100 mmol/L (ref 97–108)
GFR calc Af Amer: 89 mL/min/{1.73_m2} (ref >59)
Total Bilirubin: 0.5 mg/dL (ref 0.0–1.2)

## 2011-07-20 ENCOUNTER — Telehealth: Payer: Self-pay | Admitting: Family Medicine

## 2011-07-20 NOTE — Telephone Encounter (Signed)
Pt is calling to see who you would recommend in [facility] to see for Knee pain. She forgot to ask about that at the last office visit.

## 2011-07-20 NOTE — Telephone Encounter (Signed)
All of the orthopedist and sports med docs in [place] are good. Dr. Patsy Lager is also excellent and he is in our office.

## 2011-07-20 NOTE — Telephone Encounter (Signed)
Left message for pt to call back  °

## 2011-07-20 NOTE — Telephone Encounter (Signed)
Advised pt.  She said she would call back to schedule appt with Dr. Patsy Lager.

## 2011-08-04 ENCOUNTER — Telehealth: Payer: Self-pay | Admitting: Family Medicine

## 2011-08-04 MED ORDER — GLIMEPIRIDE 4 MG PO TABS: ORAL_TABLET | ORAL | Status: DC

## 2011-08-04 MED ORDER — METFORMIN HCL 1000 MG PO TABS: 1000.0000 mg | ORAL_TABLET | Freq: Two times a day (BID) | ORAL | Status: DC

## 2011-08-04 MED ORDER — CASANTHRANOL-DOCUSATE SODIUM 30-100 MG PO CAPS: 1.0000 | ORAL_CAPSULE | Freq: Every day | ORAL | Status: DC

## 2011-08-04 MED ORDER — LOSARTAN POTASSIUM 50 MG PO TABS: ORAL_TABLET | ORAL | Status: DC

## 2011-08-04 MED ORDER — AZELASTINE HCL 0.1 % NA SOLN: 2.0000 | Freq: Two times a day (BID) | NASAL | Status: DC

## 2011-08-04 MED ORDER — ALBUTEROL SULFATE HFA 108 (90 BASE) MCG/ACT IN AERS: 2.0000 | INHALATION_SPRAY | RESPIRATORY_TRACT | Status: DC | PRN

## 2011-08-04 MED ORDER — OXYBUTYNIN CHLORIDE ER 5 MG PO TB24: ORAL_TABLET | ORAL | Status: DC

## 2011-08-04 MED ORDER — LEVOTHYROXINE SODIUM 75 MCG PO TABS: 75.0000 ug | ORAL_TABLET | Freq: Every day | ORAL | Status: DC

## 2011-08-04 NOTE — Telephone Encounter (Signed)
Spoke with patient and all Rx's were sent to [facility] except the OTC medication, Metformin [facility] per her request.

## 2011-08-04 NOTE — Telephone Encounter (Signed)
Lowella Bandy, would you mind sending these in right away for her?  I think Jacki Cones isn't checking her desktop this am since she is at [employer]. Thanks!

## 2011-08-04 NOTE — Telephone Encounter (Signed)
Left message on cell phone voicemail for patient to return call. 

## 2011-08-04 NOTE — Telephone Encounter (Signed)
Pt was in recently for med refill. [facility] says they haven't received any medication refill request. She needs Metformin ASAP she is almost completely out but she needs all rx's to be sent in as well because they are saying they have all run out. [facility] [address] To get her over until she gets them in from [facility]

## 2011-08-09 ENCOUNTER — Telehealth: Payer: Self-pay

## 2011-08-09 ENCOUNTER — Telehealth: Payer: Self-pay | Admitting: Family Medicine

## 2011-08-09 MED ORDER — MOMETASONE FUROATE 50 MCG/ACT NA SUSP: 2.0000 | Freq: Every day | NASAL | Status: DC

## 2011-08-09 MED ORDER — GLUCOSE BLOOD VI STRP: ORAL_STRIP | Status: DC

## 2011-08-09 NOTE — Telephone Encounter (Signed)
Nasonex 50 mcg/Act #17 g x 3 refills per Dr Dayton Martes sent to [facility]. Patient notified as instructed by telephone that test strips and Nasonex had been sent to [facility]. Pt thanked me and asked that Jamesetta So f/u on meds filled that pt did not need.

## 2011-08-09 NOTE — Telephone Encounter (Signed)
Pt spoke with Clarisa Schools (see phone note 08/09/11) and pt  did not get one touch ultra test strips and Nasonex at last office visit 07/18/11. Pt is not out of Nasonex or test strips but pt wants both sent to [facility]. I sent test strips but I added Nasonex to med list when I reviewed and updated med list with pt this AM. Is it OK to fill Nasonex. Pt can be reached at [phone] and wants call back after med filled.

## 2011-08-09 NOTE — Telephone Encounter (Signed)
Pt said that she received medications from mail order [facility] that she is no longer taking and that she did not get her glucose test strips or Nasonex that she needed refilled.  Call forwarded to CMA to verify meds and what was needed and will be updated on med list and sent for refill per patient's request.  Will follow up with [facility].  Advised patient to call and stop shipment and speak with [facility] as well.

## 2011-08-09 NOTE — Telephone Encounter (Signed)
Yes ok to refill 

## 2011-08-18 ENCOUNTER — Telehealth: Payer: Self-pay | Admitting: *Deleted

## 2011-08-18 NOTE — Telephone Encounter (Signed)
I left message on patient's voice mail asking her to call me back so that we can go over her medicine list to verify that we are up to date with what she is currently taking.

## 2011-08-22 NOTE — Telephone Encounter (Signed)
I called pt again, asking her to go over her med list with me so that we would have everything right.  She said she has already done that and I advised her that our manager had asked that I go over the list with her also.  She told me that she doesn't have time to do that now so I asked her to call back when more convenient for her.  She said she would.

## 2011-08-29 NOTE — Telephone Encounter (Signed)
Called insurance and was informed that the patient would need to go to their website and print the form for the medication reimbursement.  Called the patient and explained what the insurance company had instructed and requested that she go to their website and print the form for reimbursement to bring to our office for completion and asked pt to call the insurance company if she had questions about their process for printing the form.

## 2011-09-05 ENCOUNTER — Other Ambulatory Visit: Payer: Self-pay

## 2011-09-05 MED ORDER — MOMETASONE FUROATE 50 MCG/ACT NA SUSP: 2.0000 | Freq: Every day | NASAL | Status: DC

## 2011-09-05 NOTE — Telephone Encounter (Signed)
Pt said received test strips filled on 08/09/11 but not Nasonex. Unable to phone [facility]. Refilled Nasonex 50 mcg/act nasal spray 17 g x 3 to [facility]. Pt will ck to see if processing and will call back if needed.

## 2011-10-03 ENCOUNTER — Encounter: Payer: Self-pay | Admitting: Family Medicine

## 2011-10-04 ENCOUNTER — Telehealth: Payer: Self-pay | Admitting: Family Medicine

## 2011-10-04 NOTE — Telephone Encounter (Signed)
Whitney Knight, can this note be closed?

## 2011-10-04 NOTE — Telephone Encounter (Signed)
Updated patient on request to refund from pharmacy.  Patient sent form to me a week ago and I explained that I had received and was out of the office last week and had been working on completing the form. Confirmed that we could use current address and phone and EMR information to complete the form and patient gave verbal permission to complete the form in its entirety.  I told her it would be completed and sent to the pharmacy this week and that I would call her back by the week's end. Patient was agreeable.

## 2011-10-06 ENCOUNTER — Telehealth: Payer: Self-pay | Admitting: Family Medicine

## 2011-10-06 NOTE — Telephone Encounter (Signed)
I observed Whitney Knight place an envelope in the mail to [name].

## 2011-10-06 NOTE — Telephone Encounter (Signed)
Called patient to notify that the refund form from [facility] that she sent to the office has been completed and requires her signature and would need receipts/labels from medication seeking refund for and faxed form to her for her signature and receipts. I had initially explained that the companies will ask for a receipt for refund amount being requested and I had asked her for receipts in earlier conversations and had not received them.    I also advised that the [facility] form states that they will only correspond with the member and I advised her of this information and she requested that we send the form and handle it.  I informed that I would be glad to mail the form along with the receipts that she had attached and advised her to continue to correspond with [facility] as the pharmacy companies do prefer to talk directly with the patient and I would not want her refund to be delayed because they had not talked with her.  I apologized for the inconvenience and offered to assist if they were not cooperative and that I'd work on taking care of it if they did not and also provide her with additional information if they requested more information.

## 2011-10-06 NOTE — Telephone Encounter (Signed)
I received the form she is required to complete prior to my week out of the office last week and I completed it for her this week and have been talking with the patient on how to request the refund.  Please see other notes on the chart for current status.  I mailed the form today and I am leaving it open to follow up with patient in a 10-14 days to see if she has received response from [facility].    Thanks

## 2011-10-06 NOTE — Telephone Encounter (Signed)
Placed in mail today to [facility] address provided on the form.  Copies of forms on file and patient said she had copies of the form as well for future reference.  Rena observed the form being placed in the mail bag.

## 2011-10-23 NOTE — Telephone Encounter (Signed)
Spoke with Arlys John about reimbursing patient and sent to A. Trisha Mangle to process check for patient.  Called patient to inform her of the check being requested for her reimbursement.

## 2011-11-16 NOTE — Telephone Encounter (Signed)
Check was sent to patient per accounting.

## 2011-11-28 ENCOUNTER — Other Ambulatory Visit: Payer: Self-pay | Admitting: *Deleted

## 2011-11-28 MED ORDER — MOMETASONE FUROATE 50 MCG/ACT NA SUSP: 2.0000 | Freq: Every day | NASAL | Status: DC

## 2011-12-26 NOTE — Telephone Encounter (Signed)
Jamesetta So, can this note now be closed?  Please advise.

## 2012-01-11 NOTE — Telephone Encounter (Signed)
Whitney Knight, can I close this note now?

## 2012-01-12 NOTE — Telephone Encounter (Signed)
Spoke with Whitney Knight today and confirmed that she has been reimbursed. 

## 2012-01-12 NOTE — Telephone Encounter (Signed)
Spoke with Whitney Knight today and confirmed that she has been reimbursed.

## 2012-01-12 NOTE — Telephone Encounter (Signed)
I spoke with Whitney Knight today and confirmed that she has been reimbursed.

## 2012-01-19 ENCOUNTER — Encounter: Payer: Self-pay | Admitting: Family Medicine

## 2012-01-19 ENCOUNTER — Telehealth: Payer: Self-pay | Admitting: *Deleted

## 2012-01-19 NOTE — Telephone Encounter (Signed)
Opened in error

## 2012-03-19 ENCOUNTER — Encounter: Payer: Self-pay | Admitting: Internal Medicine

## 2012-03-19 ENCOUNTER — Ambulatory Visit (INDEPENDENT_AMBULATORY_CARE_PROVIDER_SITE_OTHER): Payer: 59 | Admitting: Internal Medicine

## 2012-03-19 VITALS — BP 130/82 | HR 81 | Temp 98.2°F | Ht 64.0 in | Wt 183.8 lb

## 2012-03-19 DIAGNOSIS — E119 Type 2 diabetes mellitus without complications: Secondary | ICD-10-CM

## 2012-03-19 DIAGNOSIS — R0989 Other specified symptoms and signs involving the circulatory and respiratory systems: Secondary | ICD-10-CM

## 2012-03-19 DIAGNOSIS — R0600 Dyspnea, unspecified: Secondary | ICD-10-CM

## 2012-03-19 DIAGNOSIS — R05 Cough: Secondary | ICD-10-CM

## 2012-03-19 DIAGNOSIS — E039 Hypothyroidism, unspecified: Secondary | ICD-10-CM

## 2012-03-19 DIAGNOSIS — E663 Overweight: Secondary | ICD-10-CM | POA: Insufficient documentation

## 2012-03-19 DIAGNOSIS — E669 Obesity, unspecified: Secondary | ICD-10-CM

## 2012-03-19 DIAGNOSIS — I10 Essential (primary) hypertension: Secondary | ICD-10-CM

## 2012-03-19 MED ORDER — PHENTERMINE HCL 15 MG PO CAPS: 15.0000 mg | ORAL_CAPSULE | ORAL | Status: DC

## 2012-03-19 NOTE — Assessment & Plan Note (Signed)
Patient reports good control of blood sugars with metformin and occasional use of glimepiride.  Will continue current medications. Will check A1c with labs today.

## 2012-03-19 NOTE — Progress Notes (Signed)
Subjective:    Patient ID: [name], female    DOB: 08/29/1950, 61 y.o.   MRN: [id number]  HPI 61 year old female with history of diabetes, hypertension, migraine headaches, hypothyroidism presents to transfer her care. She was previously seen at one of our other offices. In regards to diabetes, she reports blood sugars have been well controlled with use of metformin. She occasionally uses Amaryl if consuming a large amount of carbohydrates or while taking prednisone. She reports blood sugars have been well-controlled. She denied bring record with her today. She was recently on prednisone in September 2013 after returning from a trip to [place] during which she developed chronic cough consistent with bronchitis. She reports she was treated with multiple courses of antibiotics and steroids with final improvement after about 3 months. She was also seen by pulmonary medicine at that time they felt that symptoms are most consistent with chronic sinusitis. She reports the cough has improved she continues to have some episodes of cough, particularly when exposed to hot temperatures. She notes intolerance to hot environmental temperatures. She questions whether her dose of Synthroid is adequate. She reports full compliance with this medication.  She was also recently evaluated for migraine headaches. She was started on propanolol but developed blurred vision on this medication. She reports that headaches have generally resolved. She has reports that she had CT of the head which was negative. She is not interested in trying Topamax.  She is concerned about her weight. She reports difficulty losing weight. She has not followed a specific diet and she does not exercise. 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 She has tolerated phentermine in the past except for exacerbation of headache. She would like to try this medication again for appetite suppression.  Outpatient Encounter  Prescriptions as of 03/19/2012  Medication Sig Dispense Refill  . aspirin 81 MG tablet Take 81 mg by mouth daily.        Jennette Banker Sodium 30-100 MG CAPS Take 1 tablet by mouth daily.  90 each  3  . fish oil-omega-3 fatty acids 1000 MG capsule Take 1 g by mouth daily.        . fluticasone (FLONASE) 50 MCG/ACT nasal spray Place 2 sprays into the nose daily.      [name] glimepiride (AMARYL) 4 MG tablet TAKE 1 TABLET BY MOUTH DAILY  90 tablet  3  . glucose blood (ONE TOUCH ULTRA TEST) test strip Check blood sugar once daily and as needed. Diagnosis code 250.00  100 each  3  . Lancets 28G MISC 1 applicator by Does not apply route daily.      [name] levothyroxine (SYNTHROID, LEVOTHROID) 75 MCG tablet Take 1 tablet (75 mcg total) by mouth daily.  90 tablet  3  . losartan (COZAAR) 50 MG tablet TAKE 1 TABLET BY MOUTH ONCE A DAY  90 tablet  3  . metFORMIN (GLUCOPHAGE) 1000 MG tablet Take 1 tablet (1,000 mg total) by mouth 2 (two) times daily with a meal.  30 tablet  0  . oxybutynin (DITROPAN-XL) 5 MG 24 hr tablet TAKE 1 TABLET BY MOUTH ONCE A DAY  90 tablet  3  . phentermine 15 MG capsule Take 1 capsule (15 mg total) by mouth every morning.  30 capsule  1  . [DISCONTINUED] mometasone (NASONEX) 50 MCG/ACT nasal spray Place 2 sprays into the nose daily.  48 g  2  . [DISCONTINUED] TURMERIC PO Take 1 tablet by mouth daily.  BP 130/82  Pulse 81  Temp 98.2 F (36.8 C) (Oral)  Ht 5\' 4"  (1.626 m)  Wt 183 lb 12 oz (83.348 kg)  BMI 31.54 kg/m2  SpO2 98%  Review of Systems  Constitutional: Positive for diaphoresis. Negative for fever, chills, appetite change, fatigue and unexpected weight change.  HENT: Negative for ear pain, congestion, sore throat, trouble swallowing, neck pain, voice change and sinus pressure.   Eyes: Negative for visual disturbance.  Respiratory: Positive for cough. Negative for shortness of breath, wheezing and stridor.   Cardiovascular: Negative for chest pain, palpitations  and leg swelling.  Gastrointestinal: Negative for nausea, vomiting, abdominal pain, diarrhea, constipation, blood in stool, abdominal distention and anal bleeding.  Genitourinary: Negative for dysuria and flank pain.  Musculoskeletal: Positive for arthralgias (bilateral knee pain). Negative for myalgias and gait problem.  Skin: Negative for color change and rash.  Neurological: Negative for dizziness and headaches.  Hematological: Negative for adenopathy. Does not bruise/bleed easily.  Psychiatric/Behavioral: Negative for suicidal ideas, sleep disturbance and dysphoric mood. The patient is not nervous/anxious.        Objective:   Physical Exam  Constitutional: She is oriented to person, place, and time. She appears well-developed and well-nourished. No distress.  HENT:  Head: Normocephalic and atraumatic.  Right Ear: External ear normal.  Left Ear: External ear normal.  Nose: Nose normal.  Mouth/Throat: Oropharynx is clear and moist. No oropharyngeal exudate.  Eyes: Conjunctivae normal are normal. Pupils are equal, round, and reactive to light. Right eye exhibits no discharge. Left eye exhibits no discharge. No scleral icterus.  Neck: Normal range of motion. Neck supple. No tracheal deviation present. No thyromegaly present.  Cardiovascular: Normal rate, regular rhythm, normal heart sounds and intact distal pulses.  Exam reveals no gallop and no friction rub.   No murmur heard. Pulmonary/Chest: Effort normal. No accessory muscle usage. Not tachypneic. No respiratory distress. She has decreased breath sounds (prolonged exp phase). She has no wheezes. She has no rhonchi. She has no rales. She exhibits no tenderness.  Musculoskeletal: Normal range of motion. She exhibits no edema and no tenderness.  Lymphadenopathy:    She has no cervical adenopathy.  Neurological: She is alert and oriented to person, place, and time. No cranial nerve deficit. She exhibits normal muscle tone. Coordination  normal.  Skin: Skin is warm and dry. No rash noted. She is not diaphoretic. No erythema. No pallor.  Psychiatric: She has a normal mood and affect. Her behavior is normal. Judgment and thought content normal.          Assessment & Plan:

## 2012-03-19 NOTE — Assessment & Plan Note (Addendum)
BMI 31. Patient has been taking an over-the-counter supplement to help with weight loss. Encouraged her to avoid use of supplemental medications. Will try re\re starting phentermine at a lower dose to see if this can help curb appetite in the evenings. She will monitor blood pressure while on this medication. She will need monthly followup while on phentermine. We also discussed the potential use of Belviq. Information given on this medication today. We discussed the potential use of Topamax as well but she would prefer not to use this medication because of risk of side effects. Encouraged goal of exercise 5 days per week. Followup one month.

## 2012-03-19 NOTE — Assessment & Plan Note (Signed)
Blood pressures well-controlled on Cozaar. Will continue. Will check renal function with labs today. Followup one month.

## 2012-03-19 NOTE — Assessment & Plan Note (Signed)
Patient symptomatic of temperature intolerance. Will check TSH with labs today. Continue Synthroid.

## 2012-03-19 NOTE — Patient Instructions (Addendum)
[employer] [facility] - Exercise or [employer]itness on [employer] - Delorise Shiner and [employer]  Lorcaserin oral tablets What is this medicine? LORCASERIN (lor ca SER in) is used to promote and maintain weight loss in obese patients. This medicine should be used with a reduced calorie diet and, if appropriate, an exercise program. This medicine may be used for other purposes; ask your health care provider or pharmacist if you have questions. What should I tell my health care provider before I take this medicine? They need to know if you have any of these conditions: -anatomical deformation of the penis, Peyronie's disease, or history of priapism (painful and prolonged erection) -diabetes -heart disease -history of blood diseases, like sickle cell anemia or leukemia -history of irregular heartbeat -kidney disease -liver disease -suicidal thoughts, plans, or attempt; a previous suicide attempt by you or a family member -an unusual or allergic reaction to lorcaserin, other medicines, foods, dyes, or preservatives -pregnant or trying to get pregnant -breast-feeding How should I use this medicine? Take this medicine by mouth with a glass of water. Follow the directions on the prescription label. You can take it with or without food. Take your medicine at regular intervals. Do not take it more often than directed. Do not stop taking except on your doctor's advice. Talk to your pediatrician regarding the use of this medicine in children. Special care may be needed. Overdosage: If you think you've taken too much of this medicine contact a poison control center or emergency room at once. Overdosage: If you think you have taken too much of this medicine contact a poison control center or emergency room at once. NOTE: This medicine is only for you. Do not share this medicine with others. What if I miss a dose? If you miss a dose, take it as soon as you can. If it is almost time for your next dose, take only that dose. Do not  take double or extra doses. What may interact with this medicine? -cabergoline -certain medicines for depression, anxiety, or psychotic disturbances -certain medicines for erectile dysfunction -certain medicines for migraine headache like almotriptan, eletriptan, frovatriptan, naratriptan, rizatriptan, sumatriptan, zolmitriptan -dextromethorphan -linezolid -MAOIs like Carbex, Eldepryl, Marplan, Nardil, and Parnate -medicines for diabetes -orlistat -tramadol -St. John's Wort -stimulant medicines for attention disorders, weight loss, or to stay awake This list may not describe all possible interactions. Give your health care provider a list of all the medicines, herbs, non-prescription drugs, or dietary supplements you use. Also tell them if you smoke, drink alcohol, or use illegal drugs. Some items may interact with your medicine. What should I watch for while using this medicine? This medicine is intended to be used in addition to a healthy diet and appropriate exercise. The best results are achieved this way. Do not increase or in any way change your dose without consulting your doctor or health care professional. Your doctor should tell you to stop taking this medicine if you do not lose a certain amount of weight within the first 12 weeks of treatment. Visit your doctor or health care professional for regular checkups. Your doctor may order blood tests or other tests to see how you are doing. Do not drive, use machinery, or do anything that needs mental alertness until you know how this medicine affects you. This medicine may affect blood sugar levels. If you have diabetes, check with your doctor or health care professional before you change your diet or the dose of your diabetic medicine. Patients and their families should  watch out for worsening depression or thoughts of suicide. Also watch out for sudden changes in feelings such as feeling anxious, agitated, panicky, irritable, hostile,  aggressive, impulsive, severely restless, overly excited and hyperactive, or not being able to sleep. If this happens, especially at the beginning of treatment or after a change in dose, call your health care professional. Contact you doctor or health care professional right away if the erection lasts longer than 4 hours or if it becomes painful. This may be a sign of serious problem and must be treated right away to prevent permanent damage. What side effects may I notice from receiving this medicine? Side effects that you should report to your doctor or health care professional as soon as possible: -allergic reactions like skin rash, itching or hives, swelling of the face, lips, or tongue -abnormal production of milk -breast enlargement in both males and females -breathing problems -changes in emotions or moods -changes in vision -confusion -erection lasting more than 4 hours -fast or irregular heart beat -feeling faint or lightheaded, falls -fever or chills, sore throat -hallucination, loss of contact with reality -high or low blood pressure -menstrual changes -restlessness -seizures -slow or irregular heartbeat -stiff muscles -sweating -suicidal thoughts or other mood changes -tremors -trouble walking -unusually weak or tired -vomiting  Side effects that usually do not require medical attention (Report these to your doctor or health care professional if they continue or are bothersome.): -back pain -constipation -cough -dizziness -dry mouth -low blood sugar if you have diabetes (ask your doctor or healthcare professional for a list of these symptoms) -nausea -tiredness This list may not describe all possible side effects. Call your doctor for medical advice about side effects. You may report side effects to FDA at 1-800-FDA-1088. Where should I keep my medicine? Keep out of the reach of children. Store at room temperature between 15 and 30 degrees C (59 and 86 degrees F).  Throw away any unused medicine after the expiration date. NOTE: This sheet is a summary. It may not cover all possible information. If you have questions about this medicine, talk to your doctor, pharmacist, or health care provider.  2013, [employer]. (11/01/2010 5:15:17 PM)

## 2012-03-19 NOTE — Assessment & Plan Note (Signed)
Some persistent symptoms of cough which seemed to be triggered by hot environmental temperatures. PFTs today showed no evidence of obstruction. Question if she may have GERD which is also contributing. Will add Prilosec and see if any improvement. Followup one month.

## 2012-03-20 ENCOUNTER — Encounter: Payer: Self-pay | Admitting: Internal Medicine

## 2012-04-08 ENCOUNTER — Encounter: Payer: Self-pay | Admitting: Internal Medicine

## 2012-04-16 ENCOUNTER — Ambulatory Visit: Payer: 59 | Admitting: Internal Medicine

## 2012-05-01 HISTORY — PX: COLONOSCOPY: SHX174

## 2012-05-13 ENCOUNTER — Other Ambulatory Visit: Payer: Self-pay | Admitting: Internal Medicine

## 2012-05-14 LAB — COMPREHENSIVE METABOLIC PANEL
ALT: 32 IU/L (ref 0–32)
AST: 31 IU/L (ref 0–40)
Alkaline Phosphatase: 102 IU/L (ref 39–117)
BUN/Creatinine Ratio: 12 (ref 9–23)
BUN: 10 mg/dL (ref 6–24)
CO2: 26 mmol/L (ref 19–28)
Chloride: 100 mmol/L (ref 97–108)
Sodium: 141 mmol/L (ref 134–144)

## 2012-05-14 LAB — CBC WITH DIFFERENTIAL
Basophils Absolute: 0 10*3/uL (ref 0.0–0.2)
Immature Granulocytes: 0 % (ref 0–2)
Lymphocytes Absolute: 1.5 10*3/uL (ref 0.7–3.1)
MCV: 86 fL (ref 79–97)
Monocytes: 8 % (ref 4–12)
Platelets: 190 10*3/uL (ref 155–379)
RDW: 13.7 % (ref 12.3–15.4)
WBC: 5.9 10*3/uL (ref 3.4–10.8)

## 2012-05-14 LAB — HGB A1C W/O EAG: Hgb A1c MFr Bld: 8 % — ABNORMAL HIGH (ref 4.8–5.6)

## 2012-05-14 LAB — LIPID PANEL W/O CHOL/HDL RATIO
Cholesterol, Total: 159 mg/dL (ref 100–199)
Triglycerides: 90 mg/dL (ref 0–149)

## 2012-05-14 LAB — TSH: TSH: 1.94 u[IU]/mL (ref 0.450–4.500)

## 2012-05-14 LAB — T4: T4, Total: 11.3 ug/dL (ref 4.5–12.0)

## 2012-05-23 ENCOUNTER — Encounter: Payer: Self-pay | Admitting: Internal Medicine

## 2012-05-23 ENCOUNTER — Ambulatory Visit (INDEPENDENT_AMBULATORY_CARE_PROVIDER_SITE_OTHER): Payer: 59 | Admitting: Internal Medicine

## 2012-05-23 VITALS — BP 128/80 | HR 84 | Temp 98.2°F | Wt 179.0 lb

## 2012-05-23 DIAGNOSIS — E039 Hypothyroidism, unspecified: Secondary | ICD-10-CM

## 2012-05-23 DIAGNOSIS — J302 Other seasonal allergic rhinitis: Secondary | ICD-10-CM

## 2012-05-23 DIAGNOSIS — I10 Essential (primary) hypertension: Secondary | ICD-10-CM

## 2012-05-23 DIAGNOSIS — E1165 Type 2 diabetes mellitus with hyperglycemia: Secondary | ICD-10-CM | POA: Insufficient documentation

## 2012-05-23 DIAGNOSIS — E118 Type 2 diabetes mellitus with unspecified complications: Secondary | ICD-10-CM | POA: Insufficient documentation

## 2012-05-23 DIAGNOSIS — E669 Obesity, unspecified: Secondary | ICD-10-CM

## 2012-05-23 DIAGNOSIS — J309 Allergic rhinitis, unspecified: Secondary | ICD-10-CM

## 2012-05-23 MED ORDER — LANCETS 28G MISC: 1.0000 | Freq: Every day | Status: DC

## 2012-05-23 MED ORDER — LEVOTHYROXINE SODIUM 75 MCG PO TABS: 75.0000 ug | ORAL_TABLET | Freq: Every day | ORAL | Status: DC

## 2012-05-23 MED ORDER — METFORMIN HCL 1000 MG PO TABS: 1000.0000 mg | ORAL_TABLET | Freq: Two times a day (BID) | ORAL | Status: DC

## 2012-05-23 MED ORDER — GLUCOSE BLOOD VI STRP: ORAL_STRIP | Status: DC

## 2012-05-23 MED ORDER — LOSARTAN POTASSIUM 50 MG PO TABS: ORAL_TABLET | ORAL | Status: DC

## 2012-05-23 MED ORDER — OXYBUTYNIN CHLORIDE ER 5 MG PO TB24: ORAL_TABLET | ORAL | Status: DC

## 2012-05-23 MED ORDER — PHENTERMINE HCL 15 MG PO CAPS: 15.0000 mg | ORAL_CAPSULE | ORAL | Status: DC

## 2012-05-23 MED ORDER — FLUTICASONE PROPIONATE 50 MCG/ACT NA SUSP: 2.0000 | Freq: Every day | NASAL | Status: DC

## 2012-05-23 NOTE — Progress Notes (Signed)
**Note Whitney-Identified via Obfuscation** Subjective:    Patient ID: Whitney Knight, female    DOB: [birth year], 62 y.o.   MRN: [id number]  HPI 62 year old female with history of diabetes, obesity presents for followup. In interim since her last visit, she had blood work which showed A1c was elevated at 8%. She has been more closely monitoring her diet and has limited processed carbohydrates. She brings record of blood sugars today which show most fasting sugars between 100-120 and postprandial blood sugars near 120-140. She reports compliance with her medications.  In regards to her obesity, she reports she has been increasing her physical activity and limiting caloric intake. She continues to take phentermine to help with appetite suppression. She denies any side effects from this medication.  Outpatient Encounter Prescriptions as of 05/23/2012  Medication Sig Dispense Refill  . aspirin 81 MG tablet Take 81 mg by mouth daily.        . calcium carbonate (OS-CAL) 600 MG TABS Take 600 mg by mouth daily.      Jennette Banker Sodium 30-100 MG CAPS Take 1 tablet by mouth daily.  90 each  3  . cholecalciferol (VITAMIN D) 1000 UNITS tablet Take 1,000 Units by mouth daily.      . fish oil-omega-3 fatty acids 1000 MG capsule Take 1 g by mouth daily.        . fluticasone (FLONASE) 50 MCG/ACT nasal spray Place 2 sprays into the nose daily.  48 g  3  . glimepiride (AMARYL) 4 MG tablet TAKE 1 TABLET BY MOUTH DAILY  90 tablet  3  . glucose blood (ONE TOUCH ULTRA TEST) test strip Check blood sugar once daily and as needed. Diagnosis code 250.00  100 each  3  . Lancets 28G MISC 1 applicator by Does not apply route daily.  100 each  3  . levothyroxine (SYNTHROID, LEVOTHROID) 75 MCG tablet Take 1 tablet (75 mcg total) by mouth daily.  90 tablet  3  . losartan (COZAAR) 50 MG tablet TAKE 1 TABLET BY MOUTH ONCE A DAY  90 tablet  3  . metFORMIN (GLUCOPHAGE) 1000 MG tablet Take 1 tablet (1,000 mg total) by mouth 2 (two) times daily with a meal.  180  tablet  3  . oxybutynin (DITROPAN-XL) 5 MG 24 hr tablet TAKE 1 TABLET BY MOUTH ONCE A DAY  90 tablet  3  . phentermine 15 MG capsule Take 1 capsule (15 mg total) by mouth every morning.  30 capsule  1   BP 128/80  Pulse 84  Temp 98.2 F (36.8 C) (Oral)  Wt 179 lb (81.194 kg)  SpO2 96%  Review of Systems  Constitutional: Negative for fever, chills, appetite change, fatigue and unexpected weight change.  HENT: Negative for ear pain, congestion, sore throat, trouble swallowing, neck pain, voice change and sinus pressure.   Eyes: Negative for visual disturbance.  Respiratory: Negative for cough, shortness of breath, wheezing and stridor.   Cardiovascular: Negative for chest pain, palpitations and leg swelling.  Gastrointestinal: Negative for nausea, vomiting, abdominal pain, diarrhea, constipation, blood in stool, abdominal distention and anal bleeding.  Genitourinary: Negative for dysuria and flank pain.  Musculoskeletal: Negative for myalgias, arthralgias and gait problem.  Skin: Negative for color change and rash.  Neurological: Negative for dizziness and headaches.  Hematological: Negative for adenopathy. Does not bruise/bleed easily.  Psychiatric/Behavioral: Negative for suicidal ideas, sleep disturbance and dysphoric mood. The patient is not nervous/anxious.        Objective:   Physical Exam  Constitutional: She is oriented to person, place, and time. She appears well-developed and well-nourished. No distress.  HENT:  Head: Normocephalic and atraumatic.  Right Ear: External ear normal.  Left Ear: External ear normal.  Nose: Nose normal.  Mouth/Throat: Oropharynx is clear and moist. No oropharyngeal exudate.  Eyes: Conjunctivae normal are normal. Pupils are equal, round, and reactive to light. Right eye exhibits no discharge. Left eye exhibits no discharge. No scleral icterus.  Neck: Normal range of motion. Neck supple. No tracheal deviation present. No thyromegaly present.    Cardiovascular: Normal rate, regular rhythm, normal heart sounds and intact distal pulses.  Exam reveals no gallop and no friction rub.   No murmur heard. Pulmonary/Chest: Effort normal and breath sounds normal. No respiratory distress. She has no wheezes. She has no rales. She exhibits no tenderness.  Musculoskeletal: Normal range of motion. She exhibits no edema and no tenderness.  Lymphadenopathy:    She has no cervical adenopathy.  Neurological: She is alert and oriented to person, place, and time. No cranial nerve deficit. She exhibits normal muscle tone. Coordination normal.  Skin: Skin is warm and dry. No rash noted. She is not diaphoretic. No erythema. No pallor.  Psychiatric: She has a normal mood and affect. Her behavior is normal. Judgment and thought content normal.          Assessment & Plan:

## 2012-05-23 NOTE — Assessment & Plan Note (Signed)
Lab Results  Component Value Date   HGBA1C 8.0* 05/13/2012   A1c elevated above goal, however pt has been more closely monitoring diet and record of BG today shows improvement. Will continue current medications for now. Repeat A1c in 06/2012.

## 2012-05-23 NOTE — Assessment & Plan Note (Signed)
Wt Readings from Last 3 Encounters:  05/23/12 179 lb (81.194 kg)  03/19/12 183 lb 12 oz (83.348 kg)  07/18/11 183 lb (83.008 kg)   Congratulated patient on 4 pound weight loss. Encouraged her to continue efforts at healthy diet and regular physical activity. Will continue phentermine for appetite suppression. Followup in 2 months.

## 2012-06-12 ENCOUNTER — Telehealth: Payer: Self-pay | Admitting: Internal Medicine

## 2012-06-12 NOTE — Telephone Encounter (Signed)
Received report from GYN that pt had bone density test in 03/2012 consistent with osteoporosis.  Was this addressed by OB/GYN?

## 2012-06-17 NOTE — Telephone Encounter (Signed)
I would like to bring her in to discuss this. I don't think that Calcium alone is the best option.

## 2012-06-17 NOTE — Telephone Encounter (Signed)
Spoke with patient, she stated her Ob/Gyn told her to take some Calcium.

## 2012-06-18 NOTE — Telephone Encounter (Signed)
Yes, that will be fine.

## 2012-06-18 NOTE — Telephone Encounter (Signed)
Spoke with patient and she has a f/u visit on 3/26, would like to know if that will be ok or if she need to come in sooner?

## 2012-06-18 NOTE — Telephone Encounter (Signed)
Left detailed message on patient cell phone. 

## 2012-06-25 ENCOUNTER — Encounter: Payer: Self-pay | Admitting: Internal Medicine

## 2012-06-25 MED ORDER — FLUTICASONE PROPIONATE 50 MCG/ACT NA SUSP: 2.0000 | Freq: Every day | NASAL | Status: DC

## 2012-06-25 NOTE — Telephone Encounter (Signed)
Rx sent to [facility]

## 2012-07-09 ENCOUNTER — Ambulatory Visit (INDEPENDENT_AMBULATORY_CARE_PROVIDER_SITE_OTHER): Payer: 59 | Admitting: Internal Medicine

## 2012-07-09 ENCOUNTER — Encounter: Payer: Self-pay | Admitting: Internal Medicine

## 2012-07-09 VITALS — BP 138/88 | HR 85 | Temp 98.3°F | Wt 179.0 lb

## 2012-07-09 DIAGNOSIS — J209 Acute bronchitis, unspecified: Secondary | ICD-10-CM | POA: Insufficient documentation

## 2012-07-09 MED ORDER — HYDROCOD POLST-CHLORPHEN POLST 10-8 MG/5ML PO LQCR: 5.0000 mL | Freq: Two times a day (BID) | ORAL | Status: DC | PRN

## 2012-07-09 MED ORDER — LEVOFLOXACIN 750 MG PO TABS: 750.0000 mg | ORAL_TABLET | Freq: Every day | ORAL | Status: AC

## 2012-07-09 MED ORDER — PREDNISONE (PAK) 10 MG PO TABS: ORAL_TABLET | ORAL | Status: DC

## 2012-07-09 NOTE — Patient Instructions (Signed)

## 2012-07-09 NOTE — Progress Notes (Signed)
**Note Whitney-Identified via Obfuscation** Subjective:    Patient ID: Whitney Knight, female    DOB: 1951-03-08, 62 y.o.   MRN: [id number]  HPI 62YO female presents for acute visit. Complains of 4 days cough occasionally productive of mucous, sometimes blood tinged, hoarseness, mild dyspnea. No fever, chills. No sick contacts. Took Aleve cold and sinus with no improvement.  Outpatient Encounter Prescriptions as of 07/09/2012  Medication Sig Dispense Refill  . aspirin 81 MG tablet Take 81 mg by mouth daily.        . calcium carbonate (OS-CAL) 600 MG TABS Take 600 mg by mouth daily.      Jennette Banker Sodium 30-100 MG CAPS Take 1 tablet by mouth daily.  90 each  3  . cholecalciferol (VITAMIN D) 1000 UNITS tablet Take 1,000 Units by mouth daily.      . fish oil-omega-3 fatty acids 1000 MG capsule Take 1 g by mouth daily.        . fluticasone (FLONASE) 50 MCG/ACT nasal spray Place 2 sprays into the nose daily.  48 g  3  . glimepiride (AMARYL) 4 MG tablet TAKE 1 TABLET BY MOUTH DAILY  90 tablet  3  . glucose blood (ONE TOUCH ULTRA TEST) test strip Check blood sugar once daily and as needed. Diagnosis code 250.00  100 each  3  . Lancets 28G MISC 1 applicator by Does not apply route daily.  100 each  3  . levothyroxine (SYNTHROID, LEVOTHROID) 75 MCG tablet Take 1 tablet (75 mcg total) by mouth daily.  90 tablet  3  . losartan (COZAAR) 50 MG tablet TAKE 1 TABLET BY MOUTH ONCE A DAY  90 tablet  3  . metFORMIN (GLUCOPHAGE) 1000 MG tablet Take 1 tablet (1,000 mg total) by mouth 2 (two) times daily with a meal.  180 tablet  3  . oxybutynin (DITROPAN-XL) 5 MG 24 hr tablet TAKE 1 TABLET BY MOUTH ONCE A DAY  90 tablet  3  . phentermine 15 MG capsule Take 1 capsule (15 mg total) by mouth every morning.  30 capsule  1   No facility-administered encounter medications on file as of 07/09/2012.   BP 138/88  Pulse 85  Temp(Src) 98.3 F (36.8 C) (Oral)  Wt 179 lb (81.194 kg)  BMI 30.71 kg/m2  SpO2 98%    Review of Systems   Constitutional: Positive for fatigue. Negative for fever, chills and unexpected weight change.  HENT: Negative for hearing loss, ear pain, nosebleeds, congestion, sore throat, facial swelling, rhinorrhea, sneezing, mouth sores, trouble swallowing, neck pain, neck stiffness, voice change, postnasal drip, sinus pressure, tinnitus and ear discharge.   Eyes: Negative for pain, discharge, redness and visual disturbance.  Respiratory: Positive for cough and shortness of breath. Negative for chest tightness, wheezing and stridor.   Cardiovascular: Negative for chest pain, palpitations and leg swelling.  Musculoskeletal: Negative for myalgias and arthralgias.  Skin: Negative for color change and rash.  Neurological: Negative for dizziness, weakness, light-headedness and headaches.  Hematological: Negative for adenopathy.       Objective:   Physical Exam  Constitutional: She is oriented to person, place, and time. She appears well-developed and well-nourished. No distress.  HENT:  Head: Normocephalic and atraumatic.  Right Ear: External ear normal. A middle ear effusion is present.  Left Ear: External ear normal. A middle ear effusion is present.  Nose: Nose normal.  Mouth/Throat: Oropharynx is clear and moist. No oropharyngeal exudate.  Eyes: Conjunctivae are normal. Pupils are equal, round, and reactive  to light. Right eye exhibits no discharge. Left eye exhibits no discharge. No scleral icterus.  Neck: Normal range of motion. Neck supple. No tracheal deviation present. No thyromegaly present.  Cardiovascular: Normal rate, regular rhythm, normal heart sounds and intact distal pulses.  Exam reveals no gallop and no friction rub.   No murmur heard. Pulmonary/Chest: Effort normal. No accessory muscle usage. Not tachypneic. No respiratory distress. She has no decreased breath sounds. She has no wheezes. She has rhonchi. She has no rales. She exhibits no tenderness.  Musculoskeletal: Normal range of  motion. She exhibits no edema and no tenderness.  Lymphadenopathy:    She has no cervical adenopathy.  Neurological: She is alert and oriented to person, place, and time. No cranial nerve deficit. She exhibits normal muscle tone. Coordination normal.  Skin: Skin is warm and dry. No rash noted. She is not diaphoretic. No erythema. No pallor.  Psychiatric: She has a normal mood and affect. Her behavior is normal. Judgment and thought content normal.          Assessment & Plan:

## 2012-07-09 NOTE — Assessment & Plan Note (Signed)
Symptoms and exam are consistent with acute bronchitis. Will treat with Levaquin x 7 days and prednisone taper. Will use Tussionex prn for cough. Follow up 2 weeks and prn.

## 2012-07-24 ENCOUNTER — Encounter: Payer: Self-pay | Admitting: Internal Medicine

## 2012-07-24 ENCOUNTER — Ambulatory Visit (INDEPENDENT_AMBULATORY_CARE_PROVIDER_SITE_OTHER): Payer: 59 | Admitting: Internal Medicine

## 2012-07-24 VITALS — BP 120/88 | HR 81 | Temp 98.2°F | Wt 180.0 lb

## 2012-07-24 DIAGNOSIS — E1165 Type 2 diabetes mellitus with hyperglycemia: Secondary | ICD-10-CM

## 2012-07-24 DIAGNOSIS — IMO0002 Reserved for concepts with insufficient information to code with codable children: Secondary | ICD-10-CM

## 2012-07-24 DIAGNOSIS — E669 Obesity, unspecified: Secondary | ICD-10-CM

## 2012-07-24 DIAGNOSIS — J209 Acute bronchitis, unspecified: Secondary | ICD-10-CM

## 2012-07-24 DIAGNOSIS — I10 Essential (primary) hypertension: Secondary | ICD-10-CM

## 2012-07-24 MED ORDER — PHENTERMINE HCL 15 MG PO CAPS: 15.0000 mg | ORAL_CAPSULE | ORAL | Status: DC

## 2012-07-24 NOTE — Assessment & Plan Note (Signed)
BP Readings from Last 3 Encounters:  07/24/12 120/88  07/09/12 138/88  05/23/12 128/80   BP well controlled on current medications. Will continue.

## 2012-07-24 NOTE — Assessment & Plan Note (Signed)
Wt Readings from Last 3 Encounters:  07/24/12 180 lb (81.647 kg)  07/09/12 179 lb (81.194 kg)  05/23/12 179 lb (81.194 kg)   Encouraged healthy diet and regular physical activity. Will restart phentermine to help with appetite suppression. Follow up 1 month.

## 2012-07-24 NOTE — Assessment & Plan Note (Signed)
Lab Results  Component Value Date   HGBA1C 8.0* 05/13/2012   BG improved after coming off prednisone. Will plan to repeat A1c in 07/2012.

## 2012-07-24 NOTE — Progress Notes (Signed)
Subjective:    Patient ID: [name], female    DOB: [birth year], 62 y.o.   MRN: [id number]  HPI 62YO female with h/o DM, HTN presents for follow up. Recently treated for acute bronchitis with antibiotics and prednisone taper. Reports symptoms of cough improved. Occasional productive cough with purulent sputum in the mornings. Denies dyspnea. Denies fever, chills. Feels that symptoms are gradually improving.   DM- noted some elevated BG over 400 while on prednisone. This has resolved. Compliant with medications.  Obesity - Would like to get back on phentermine to help with appetite suppression. Trying to limit caloric intake.  Outpatient Encounter Prescriptions as of 07/24/2012  Medication Sig Dispense Refill  . aspirin 81 MG tablet Take 81 mg by mouth daily.        . calcium carbonate (OS-CAL) 600 MG TABS Take 600 mg by mouth daily.      Jennette Banker Sodium 30-100 MG CAPS Take 1 tablet by mouth daily.  90 each  3  . cholecalciferol (VITAMIN D) 1000 UNITS tablet Take 1,000 Units by mouth daily.      . fish oil-omega-3 fatty acids 1000 MG capsule Take 1 g by mouth daily.        . fluticasone (FLONASE) 50 MCG/ACT nasal spray Place 2 sprays into the nose daily.  48 g  3  . glimepiride (AMARYL) 4 MG tablet TAKE 1 TABLET BY MOUTH DAILY  90 tablet  3  . glucose blood (ONE TOUCH ULTRA TEST) test strip Check blood sugar once daily and as needed. Diagnosis code 250.00  100 each  3  . Lancets 28G MISC 1 applicator by Does not apply route daily.  100 each  3  . levothyroxine (SYNTHROID, LEVOTHROID) 75 MCG tablet Take 1 tablet (75 mcg total) by mouth daily.  90 tablet  3  . losartan (COZAAR) 50 MG tablet TAKE 1 TABLET BY MOUTH ONCE A DAY  90 tablet  3  . metFORMIN (GLUCOPHAGE) 1000 MG tablet Take 1 tablet (1,000 mg total) by mouth 2 (two) times daily with a meal.  180 tablet  3  . omeprazole (PRILOSEC) 20 MG capsule Take 20 mg by mouth daily.      [name] oxybutynin (DITROPAN-XL) 5 MG 24 hr  tablet TAKE 1 TABLET BY MOUTH ONCE A DAY  90 tablet  3  . phentermine 15 MG capsule Take 1 capsule (15 mg total) by mouth every morning.  30 capsule  1  . [DISCONTINUED] phentermine 15 MG capsule Take 1 capsule (15 mg total) by mouth every morning.  30 capsule  1   No facility-administered encounter medications on file as of 07/24/2012.   BP 120/88  Pulse 81  Temp(Src) 98.2 F (36.8 C) (Oral)  Wt 180 lb (81.647 kg)  BMI 30.88 kg/m2  SpO2 99%  Review of Systems  Constitutional: Negative for fever, chills, appetite change, fatigue and unexpected weight change.  HENT: Negative for ear pain, congestion, sore throat, trouble swallowing, neck pain, voice change and sinus pressure.   Eyes: Negative for visual disturbance.  Respiratory: Positive for cough. Negative for shortness of breath, wheezing and stridor.   Cardiovascular: Negative for chest pain, palpitations and leg swelling.  Gastrointestinal: Negative for nausea, vomiting, abdominal pain, diarrhea, constipation, blood in stool, abdominal distention and anal bleeding.  Genitourinary: Negative for dysuria and flank pain.  Musculoskeletal: Negative for myalgias, arthralgias and gait problem.  Skin: Negative for color change and rash.  Neurological: Negative for dizziness and headaches.  Hematological: Negative for adenopathy. Does not bruise/bleed easily.  Psychiatric/Behavioral: Negative for suicidal ideas, sleep disturbance and dysphoric mood. The patient is not nervous/anxious.        Objective:   Physical Exam  Constitutional: She is oriented to person, place, and time. She appears well-developed and well-nourished. No distress.  HENT:  Head: Normocephalic and atraumatic.  Right Ear: External ear normal.  Left Ear: External ear normal.  Nose: Nose normal.  Mouth/Throat: Oropharynx is clear and moist. No oropharyngeal exudate.  Eyes: Conjunctivae are normal. Pupils are equal, round, and reactive to light. Right eye exhibits no  discharge. Left eye exhibits no discharge. No scleral icterus.  Neck: Normal range of motion. Neck supple. No tracheal deviation present. No thyromegaly present.  Cardiovascular: Normal rate, regular rhythm, normal heart sounds and intact distal pulses.  Exam reveals no gallop and no friction rub.   No murmur heard. Pulmonary/Chest: Effort normal and breath sounds normal. No accessory muscle usage. Not tachypneic. No respiratory distress. She has no decreased breath sounds. She has no wheezes. She has no rhonchi. She has no rales. She exhibits no tenderness.  Musculoskeletal: Normal range of motion. She exhibits no edema and no tenderness.  Lymphadenopathy:    She has no cervical adenopathy.  Neurological: She is alert and oriented to person, place, and time. No cranial nerve deficit. She exhibits normal muscle tone. Coordination normal.  Skin: Skin is warm and dry. No rash noted. She is not diaphoretic. No erythema. No pallor.  Psychiatric: She has a normal mood and affect. Her behavior is normal. Judgment and thought content normal.          Assessment & Plan:

## 2012-07-24 NOTE — Assessment & Plan Note (Signed)
Symptoms improved after antibiotics and prednisone taper. Will continue to monitor.

## 2012-09-20 ENCOUNTER — Ambulatory Visit (INDEPENDENT_AMBULATORY_CARE_PROVIDER_SITE_OTHER): Payer: 59 | Admitting: Internal Medicine

## 2012-09-20 ENCOUNTER — Encounter: Payer: Self-pay | Admitting: Internal Medicine

## 2012-09-20 VITALS — BP 130/86 | HR 79 | Temp 98.3°F | Wt 179.0 lb

## 2012-09-20 DIAGNOSIS — J209 Acute bronchitis, unspecified: Secondary | ICD-10-CM

## 2012-09-20 DIAGNOSIS — E669 Obesity, unspecified: Secondary | ICD-10-CM

## 2012-09-20 MED ORDER — CIPROFLOXACIN HCL 500 MG PO TABS: 500.0000 mg | ORAL_TABLET | Freq: Two times a day (BID) | ORAL | Status: DC

## 2012-09-20 MED ORDER — PHENTERMINE HCL 15 MG PO CAPS: 15.0000 mg | ORAL_CAPSULE | Freq: Two times a day (BID) | ORAL | Status: DC

## 2012-09-20 MED ORDER — DOXYCYCLINE HYCLATE 100 MG PO TABS: 100.0000 mg | ORAL_TABLET | Freq: Two times a day (BID) | ORAL | Status: DC

## 2012-09-20 MED ORDER — PREDNISONE (PAK) 10 MG PO TABS: ORAL_TABLET | ORAL | Status: DC

## 2012-09-20 NOTE — Assessment & Plan Note (Signed)
Wt Readings from Last 3 Encounters:  09/20/12 179 lb (81.194 kg)  07/24/12 180 lb (81.647 kg)  07/09/12 179 lb (81.194 kg)   No change in weight over last month. Pt notes some increased appetite and dietary indiscretion at night which is likely contributing. Will try dividing Phentermine to 15mg  po bid, at breakfast and lunch, to better help control symptoms during the afternoon and evening. Follow up 4-6 weeks.

## 2012-09-20 NOTE — Assessment & Plan Note (Signed)
Symptoms and exam are consistent with acute bronchitis. Will treat with prednisone taper and doxycycline. Pt will call if no improvement over next 48-72hr.  If no improvement, would favor getting CXR.

## 2012-09-20 NOTE — Progress Notes (Signed)
Subjective:    Patient ID: [name], female    DOB: 1950/08/30, 62 y.o.   MRN: [id number]  HPI 61YO female with history of diabetes, hypothyroidism, hypertension, and recurrent bronchitis presents for followup. She reports that over the last week she has had fatigue, headache pain, sinus congestion, and cough productive of purulent sputum. She denies fever, chills, chest pain. She has been taking Tussionex for cough at night with some improvement.  In regards to weight loss, she recently started phentermine to help with appetite suppression. She reports that this has worked well for her in the mornings but she is having increased appetite and carbohydrate cravings in the evenings. She has not lost any weight. She is not following a specific weight loss or exercise program.  Outpatient Encounter Prescriptions as of 09/20/2012  Medication Sig Dispense Refill  . aspirin 81 MG tablet Take 81 mg by mouth daily.        . calcium carbonate (OS-CAL) 600 MG TABS Take 600 mg by mouth daily.      Jennette Banker Sodium 30-100 MG CAPS Take 1 tablet by mouth daily.  90 each  3  . cholecalciferol (VITAMIN D) 1000 UNITS tablet Take 1,000 Units by mouth daily.      . fish oil-omega-3 fatty acids 1000 MG capsule Take 1 g by mouth daily.        . fluticasone (FLONASE) 50 MCG/ACT nasal spray Place 2 sprays into the nose daily.  48 g  3  . glimepiride (AMARYL) 4 MG tablet TAKE 1 TABLET BY MOUTH DAILY  90 tablet  3  . glucose blood (ONE TOUCH ULTRA TEST) test strip Check blood sugar once daily and as needed. Diagnosis code 250.00  100 each  3  . Lancets 28G MISC 1 applicator by Does not apply route daily.  100 each  3  . levothyroxine (SYNTHROID, LEVOTHROID) 75 MCG tablet Take 1 tablet (75 mcg total) by mouth daily.  90 tablet  3  . losartan (COZAAR) 50 MG tablet TAKE 1 TABLET BY MOUTH ONCE A DAY  90 tablet  3  . metFORMIN (GLUCOPHAGE) 1000 MG tablet Take 1 tablet (1,000 mg total) by mouth 2 (two)  times daily with a meal.  180 tablet  3  . omeprazole (PRILOSEC) 20 MG capsule Take 20 mg by mouth daily.      [name] oxybutynin (DITROPAN-XL) 5 MG 24 hr tablet TAKE 1 TABLET BY MOUTH ONCE A DAY  90 tablet  3  . phentermine 15 MG capsule Take 1 capsule (15 mg total) by mouth 2 (two) times daily.  60 capsule  1  . [DISCONTINUED] phentermine 15 MG capsule Take 1 capsule (15 mg total) by mouth every morning.  30 capsule  1  . chlorpheniramine-HYDROcodone (TUSSIONEX) 10-8 MG/5ML LQCR Take 5 mLs by mouth every 12 (twelve) hours as needed.  140 mL  0  . ciprofloxacin (CIPRO) 500 MG tablet Take 1 tablet (500 mg total) by mouth 2 (two) times daily.  14 tablet  0  . doxycycline (VIBRA-TABS) 100 MG tablet Take 1 tablet (100 mg total) by mouth 2 (two) times daily.  20 tablet  0  . predniSONE (STERAPRED UNI-PAK) 10 MG tablet Take 60mg  day 1 then taper by 10mg  daily  21 tablet  0  . [DISCONTINUED] predniSONE (STERAPRED UNI-PAK) 10 MG tablet Take 60mg  day 1 then taper by 10mg  daily  21 tablet  0   No facility-administered encounter medications on file as of 09/20/2012.  BP 130/86  Pulse 79  Temp(Src) 98.3 F (36.8 C) (Oral)  Wt 179 lb (81.194 kg)  BMI 30.71 kg/m2  SpO2 96%  Review of Systems  Constitutional: Negative for fever, chills and unexpected weight change.  HENT: Positive for congestion, postnasal drip and sinus pressure. Negative for hearing loss, ear pain, nosebleeds, sore throat, facial swelling, rhinorrhea, sneezing, mouth sores, trouble swallowing, neck pain, neck stiffness, voice change, tinnitus and ear discharge.   Eyes: Negative for pain, discharge, redness and visual disturbance.  Respiratory: Positive for cough and shortness of breath. Negative for chest tightness, wheezing and stridor.   Cardiovascular: Negative for chest pain, palpitations and leg swelling.  Musculoskeletal: Negative for myalgias and arthralgias.  Skin: Negative for color change and rash.  Neurological: Negative for  dizziness, weakness, light-headedness and headaches.  Hematological: Negative for adenopathy.       Objective:   Physical Exam  Constitutional: She is oriented to person, place, and time. She appears well-developed and well-nourished. No distress.  HENT:  Head: Normocephalic and atraumatic.  Right Ear: External ear normal.  Left Ear: External ear normal.  Nose: Nose normal.  Mouth/Throat: Oropharynx is clear and moist. No oropharyngeal exudate.  Eyes: Conjunctivae are normal. Pupils are equal, round, and reactive to light. Right eye exhibits no discharge. Left eye exhibits no discharge. No scleral icterus.  Neck: Normal range of motion. Neck supple. No tracheal deviation present. No thyromegaly present.  Cardiovascular: Normal rate, regular rhythm, normal heart sounds and intact distal pulses.  Exam reveals no gallop and no friction rub.   No murmur heard. Pulmonary/Chest: Effort normal. No accessory muscle usage. Not tachypneic. No respiratory distress. She has no decreased breath sounds. She has no wheezes. She has rhonchi in the left middle field and the left lower field. She has no rales. She exhibits no tenderness.  Musculoskeletal: Normal range of motion. She exhibits no edema and no tenderness.  Lymphadenopathy:    She has no cervical adenopathy.  Neurological: She is alert and oriented to person, place, and time. No cranial nerve deficit. She exhibits normal muscle tone. Coordination normal.  Skin: Skin is warm and dry. No rash noted. She is not diaphoretic. No erythema. No pallor.  Psychiatric: She has a normal mood and affect. Her behavior is normal. Judgment and thought content normal.          Assessment & Plan:

## 2012-10-02 ENCOUNTER — Encounter: Payer: Self-pay | Admitting: Internal Medicine

## 2012-10-03 ENCOUNTER — Encounter: Payer: Self-pay | Admitting: Internal Medicine

## 2012-10-03 ENCOUNTER — Ambulatory Visit (INDEPENDENT_AMBULATORY_CARE_PROVIDER_SITE_OTHER): Payer: 59 | Admitting: Internal Medicine

## 2012-10-03 VITALS — HR 104 | Temp 98.7°F | Wt 179.0 lb

## 2012-10-03 DIAGNOSIS — B370 Candidal stomatitis: Secondary | ICD-10-CM

## 2012-10-03 MED ORDER — FLUCONAZOLE 150 MG PO TABS: 150.0000 mg | ORAL_TABLET | Freq: Every day | ORAL | Status: DC

## 2012-10-03 NOTE — Progress Notes (Signed)
Subjective:    Patient ID: [name], female    DOB: [birth year], 62 y.o.   MRN: [id number]  HPI 61YO female presents for acute visit. C/o several days of diffuse oral pain and white patches on gums and buccal mucosa. Took some old Nystatin for 2 days with minimal improvement. No trouble swallowing. No fever chills. Recently treated for bronchitis with Doxycycline and Prednisone.  Outpatient Encounter Prescriptions as of 10/03/2012  Medication Sig Dispense Refill  . aspirin 81 MG tablet Take 81 mg by mouth daily.        . calcium carbonate (OS-CAL) 600 MG TABS Take 600 mg by mouth daily.      Jennette Banker Sodium 30-100 MG CAPS Take 1 tablet by mouth daily.  90 each  3  . cetirizine (ZYRTEC) 10 MG tablet Take 10 mg by mouth daily.      . chlorpheniramine-HYDROcodone (TUSSIONEX) 10-8 MG/5ML LQCR Take 5 mLs by mouth every 12 (twelve) hours as needed.  140 mL  0  . cholecalciferol (VITAMIN D) 1000 UNITS tablet Take 1,000 Units by mouth daily.      . fish oil-omega-3 fatty acids 1000 MG capsule Take 1 g by mouth daily.        . fluticasone (FLONASE) 50 MCG/ACT nasal spray Place 2 sprays into the nose daily.  48 g  3  . glimepiride (AMARYL) 4 MG tablet TAKE 1 TABLET BY MOUTH DAILY  90 tablet  3  . glucose blood (ONE TOUCH ULTRA TEST) test strip Check blood sugar once daily and as needed. Diagnosis code 250.00  100 each  3  . Lancets 28G MISC 1 applicator by Does not apply route daily.  100 each  3  . levothyroxine (SYNTHROID, LEVOTHROID) 75 MCG tablet Take 1 tablet (75 mcg total) by mouth daily.  90 tablet  3  . losartan (COZAAR) 50 MG tablet TAKE 1 TABLET BY MOUTH ONCE A DAY  90 tablet  3  . metFORMIN (GLUCOPHAGE) 1000 MG tablet Take 1 tablet (1,000 mg total) by mouth 2 (two) times daily with a meal.  180 tablet  3  . omeprazole (PRILOSEC) 20 MG capsule Take 20 mg by mouth daily.      [name] oxybutynin (DITROPAN-XL) 5 MG 24 hr tablet TAKE 1 TABLET BY MOUTH ONCE A DAY  90 tablet  3  .  phentermine 15 MG capsule Take 1 capsule (15 mg total) by mouth 2 (two) times daily.  60 capsule  1  . ciprofloxacin (CIPRO) 500 MG tablet Take 1 tablet (500 mg total) by mouth 2 (two) times daily.  14 tablet  0  . fluconazole (DIFLUCAN) 150 MG tablet Take 1 tablet (150 mg total) by mouth daily.  7 tablet  0  . [DISCONTINUED] doxycycline (VIBRA-TABS) 100 MG tablet Take 1 tablet (100 mg total) by mouth 2 (two) times daily.  20 tablet  0  . [DISCONTINUED] predniSONE (STERAPRED UNI-PAK) 10 MG tablet Take 60mg  day 1 then taper by 10mg  daily  21 tablet  0   No facility-administered encounter medications on file as of 10/03/2012.   Pulse 104  Temp(Src) 98.7 F (37.1 C) (Oral)  Wt 179 lb (81.194 kg)  BMI 30.71 kg/m2  SpO2 94%  Review of Systems  Constitutional: Negative for fever, chills and unexpected weight change.  HENT: Positive for sore throat. Negative for hearing loss, ear pain, nosebleeds, congestion, facial swelling, rhinorrhea, sneezing, mouth sores, trouble swallowing, neck pain, neck stiffness, voice change, postnasal drip, sinus  pressure, tinnitus and ear discharge.   Eyes: Negative for pain, discharge, redness and visual disturbance.  Respiratory: Positive for cough (occasional dry). Negative for chest tightness, shortness of breath, wheezing and stridor.   Cardiovascular: Negative for chest pain, palpitations and leg swelling.  Musculoskeletal: Negative for myalgias and arthralgias.  Skin: Negative for color change and rash.  Neurological: Negative for dizziness, weakness, light-headedness and headaches.  Hematological: Negative for adenopathy.       Objective:   Physical Exam  Constitutional: She is oriented to person, place, and time. She appears well-developed and well-nourished. No distress.  HENT:  Head: Normocephalic and atraumatic.  Right Ear: External ear normal.  Left Ear: External ear normal.  Nose: Nose normal.  Mouth/Throat: Oropharyngeal exudate (white patches  on back of throat, diffuse erythema) and posterior oropharyngeal erythema present.  Eyes: Conjunctivae are normal. Pupils are equal, round, and reactive to light. Right eye exhibits no discharge. Left eye exhibits no discharge. No scleral icterus.  Neck: Normal range of motion. Neck supple. No tracheal deviation present. No thyromegaly present.  Cardiovascular: Normal rate, regular rhythm, normal heart sounds and intact distal pulses.  Exam reveals no gallop and no friction rub.   No murmur heard. Pulmonary/Chest: Effort normal and breath sounds normal. No accessory muscle usage. Not tachypneic. No respiratory distress. She has no decreased breath sounds. She has no wheezes. She has no rhonchi. She has no rales. She exhibits no tenderness.  Musculoskeletal: Normal range of motion. She exhibits no edema and no tenderness.  Lymphadenopathy:    She has no cervical adenopathy.  Neurological: She is alert and oriented to person, place, and time. No cranial nerve deficit. She exhibits normal muscle tone. Coordination normal.  Skin: Skin is warm and dry. No rash noted. She is not diaphoretic. No erythema. No pallor.  Psychiatric: She has a normal mood and affect. Her behavior is normal. Judgment and thought content normal.          Assessment & Plan:

## 2012-10-03 NOTE — Assessment & Plan Note (Signed)
Symptoms and exam consistent with oral candidiasis. Minimal improvement with po Nystatin. Will start Fluconazole. Plan for 7 day course. Pt will call if symptoms not improving.

## 2012-10-14 ENCOUNTER — Other Ambulatory Visit: Payer: Self-pay | Admitting: *Deleted

## 2012-10-14 DIAGNOSIS — J302 Other seasonal allergic rhinitis: Secondary | ICD-10-CM

## 2012-10-14 MED ORDER — FLUTICASONE PROPIONATE 50 MCG/ACT NA SUSP: 2.0000 | Freq: Every day | NASAL | Status: DC

## 2012-10-14 NOTE — Telephone Encounter (Signed)
Eprescribed.

## 2012-10-31 ENCOUNTER — Ambulatory Visit (INDEPENDENT_AMBULATORY_CARE_PROVIDER_SITE_OTHER): Payer: 59 | Admitting: Internal Medicine

## 2012-10-31 ENCOUNTER — Encounter: Payer: Self-pay | Admitting: Internal Medicine

## 2012-10-31 VITALS — BP 120/86 | HR 86 | Temp 98.2°F | Wt 174.0 lb

## 2012-10-31 DIAGNOSIS — R1310 Dysphagia, unspecified: Secondary | ICD-10-CM | POA: Insufficient documentation

## 2012-10-31 DIAGNOSIS — E669 Obesity, unspecified: Secondary | ICD-10-CM

## 2012-10-31 DIAGNOSIS — Z23 Encounter for immunization: Secondary | ICD-10-CM

## 2012-10-31 DIAGNOSIS — I10 Essential (primary) hypertension: Secondary | ICD-10-CM

## 2012-10-31 DIAGNOSIS — E1165 Type 2 diabetes mellitus with hyperglycemia: Secondary | ICD-10-CM

## 2012-10-31 DIAGNOSIS — B370 Candidal stomatitis: Secondary | ICD-10-CM

## 2012-10-31 MED ORDER — PHENTERMINE HCL 15 MG PO CAPS: 15.0000 mg | ORAL_CAPSULE | Freq: Two times a day (BID) | ORAL | Status: DC

## 2012-10-31 NOTE — Assessment & Plan Note (Signed)
BP Readings from Last 3 Encounters:  10/31/12 120/86  09/20/12 130/86  07/24/12 120/88   BP well controlled on current medication. Will continue. Will check renal function with labs.

## 2012-10-31 NOTE — Assessment & Plan Note (Signed)
Wt Readings from Last 3 Encounters:  10/31/12 174 lb (78.926 kg)  10/03/12 179 lb (81.194 kg)  09/20/12 179 lb (81.194 kg)   Congratulated pt on weight loss. Encouraged continued effort at healthy diet and regular physical activity. Will continue phentermine for appetite suppression.

## 2012-10-31 NOTE — Patient Instructions (Addendum)
Seborrheic Dermatitis Seborrheic dermatitis involves pink or red skin with greasy, flaky scales. This is often found on the scalp, eyebrows, nose, bearded area, and on or behind the ears. It can also occur on the central chest. It often occurs where there are more oil (sebaceous) glands. This condition is also known as dandruff. When this condition affects a baby's scalp, it is called cradle cap. It may come and go for no known reason. It can occur at any time of life from infancy to old age. CAUSES  The cause is unknown. It is not the result of too little moisture or too much oil. In some people, seborrheic dermatitis flare-ups seem to be triggered by stress. It also commonly occurs in people with certain diseases such as Parkinson's disease or HIV/AIDS. SYMPTOMS   Thick scales on the scalp.  Redness on the face or in the armpits.  The skin may seem oily or dry, but moisturizers do not help.  In infants, seborrheic dermatitis appears as scaly redness that does not seem to bother the baby. In some babies, it affects only the scalp. In others, it also affects the neck creases, armpits, groin, or behind the ears.  In adults and adolescents, seborrheic dermatitis may affect only the scalp. It may look patchy or spread out, with areas of redness and flaking. Other areas commonly affected include:  Eyebrows.  Eyelids.  Forehead.  Skin behind the ears.  Outer ears.  Chest.  Armpits.  Nose creases.  Skin creases under the breasts.  Skin between the buttocks.  Groin.  Some adults and adolescents feel itching or burning in the affected areas. DIAGNOSIS  Your caregiver can usually tell what the problem is by doing a physical exam. TREATMENT   Cortisone (steroid) ointments, creams, and lotions can help decrease inflammation.  Babies can be treated with baby oil to soften the scales, then they may be washed with baby shampoo. If this does not help, a prescription topical steroid  medicine may work.  Adults can use medicated shampoos.  Your caregiver may prescribe corticosteroid cream and shampoo containing an antifungal or yeast medicine (ketoconazole). Hydrocortisone or anti-yeast cream can be rubbed directly onto seborrheic dermatitis patches. Yeast does not cause seborrheic dermatitis, but it seems to add to the problem. In infants, seborrheic dermatitis is often worst during the first year of life. It tends to disappear on its own as the child grows. However, it may return during the teenage years. In adults and adolescents, seborrheic dermatitis tends to be a long-lasting condition that comes and goes over many years. HOME CARE INSTRUCTIONS   Use prescribed medicines as directed.  In infants, do not aggressively remove the scales or flakes on the scalp with a comb or by other means. This may lead to hair loss. SEEK MEDICAL CARE IF:   The problem does not improve from the medicated shampoos, lotions, or other medicines given by your caregiver.  You have any other questions or concerns. Document Released: 04/17/2005 Document Revised: 10/17/2011 Document Reviewed: 09/06/2009 ExitCare Patient Information 2014 [employer].  

## 2012-10-31 NOTE — Assessment & Plan Note (Signed)
Symptoms of dysphagia after recent episode of oral thrush. Will set up ENT evaluation for direct visualization of larynx. Question if she may have GERD versus esophageal candidiasis. May ultimately need referral for EGD. We discussed this today.

## 2012-10-31 NOTE — Progress Notes (Signed)
Subjective:    Patient ID: [name], female    DOB: [birth year], 62 y.o.   MRN: [id number]  HPI 61YO female with DM hypertension, hypothyroidism, h/o recurrent bronchitis, obesity presents for follow up. At last visit, was diagnosed with oral thrush. Symptoms resolved with treatment with fluconazole, however then recurred with some dysphagia and increased oral secretions over the last week or so. No oral lesions or exudate noted by pt. No fever or chills.  In regards to DM, BG have been well controlled on current medication. Pt did not bring record of BG today.  In regards to obesity, pt has been following healthy diet and reports good control of appetite with phentermine. She has lost another 5lbs.  Outpatient Encounter Prescriptions as of 10/31/2012  Medication Sig Dispense Refill  . aspirin 81 MG tablet Take 81 mg by mouth daily.        . calcium carbonate (OS-CAL) 600 MG TABS Take 600 mg by mouth daily.      Jennette Banker Sodium 30-100 MG CAPS Take 1 tablet by mouth daily.  90 each  3  . cholecalciferol (VITAMIN D) 1000 UNITS tablet Take 1,000 Units by mouth daily.      . fish oil-omega-3 fatty acids 1000 MG capsule Take 1 g by mouth daily.        . fluticasone (FLONASE) 50 MCG/ACT nasal spray Place 2 sprays into the nose daily.  48 g  3  . glimepiride (AMARYL) 4 MG tablet TAKE 1 TABLET BY MOUTH DAILY  90 tablet  3  . glucose blood (ONE TOUCH ULTRA TEST) test strip Check blood sugar once daily and as needed. Diagnosis code 250.00  100 each  3  . Lancets 28G MISC 1 applicator by Does not apply route daily.  100 each  3  . levothyroxine (SYNTHROID, LEVOTHROID) 75 MCG tablet Take 1 tablet (75 mcg total) by mouth daily.  90 tablet  3  . losartan (COZAAR) 50 MG tablet TAKE 1 TABLET BY MOUTH ONCE A DAY  90 tablet  3  . metFORMIN (GLUCOPHAGE) 1000 MG tablet Take 1 tablet (1,000 mg total) by mouth 2 (two) times daily with a meal.  180 tablet  3  . omeprazole (PRILOSEC) 20 MG  capsule Take 20 mg by mouth daily.      [name] oxybutynin (DITROPAN-XL) 5 MG 24 hr tablet TAKE 1 TABLET BY MOUTH ONCE A DAY  90 tablet  3  . phentermine 15 MG capsule Take 1 capsule (15 mg total) by mouth 2 (two) times daily.  60 capsule  1  . cetirizine (ZYRTEC) 10 MG tablet Take 10 mg by mouth daily.       No facility-administered encounter medications on file as of 10/31/2012.   BP 120/86  Pulse 86  Temp(Src) 98.2 F (36.8 C) (Oral)  Wt 174 lb (78.926 kg)  BMI 29.85 kg/m2  SpO2 97%  Review of Systems  Constitutional: Negative for fever, chills, appetite change, fatigue and unexpected weight change.  HENT: Positive for trouble swallowing. Negative for ear pain, congestion, sore throat, neck pain, voice change and sinus pressure.   Eyes: Negative for visual disturbance.  Respiratory: Negative for cough, shortness of breath, wheezing and stridor.   Cardiovascular: Negative for chest pain, palpitations and leg swelling.  Gastrointestinal: Negative for nausea, vomiting, abdominal pain, diarrhea, constipation, blood in stool, abdominal distention and anal bleeding.  Genitourinary: Negative for dysuria and flank pain.  Musculoskeletal: Negative for myalgias, arthralgias and gait problem.  Skin: Negative for color change and rash.  Neurological: Negative for dizziness and headaches.  Hematological: Negative for adenopathy. Does not bruise/bleed easily.  Psychiatric/Behavioral: Negative for suicidal ideas, sleep disturbance and dysphoric mood. The patient is not nervous/anxious.        Objective:   Physical Exam  Constitutional: She is oriented to person, place, and time. She appears well-developed and well-nourished. No distress.  HENT:  Head: Normocephalic and atraumatic.  Right Ear: External ear normal.  Left Ear: External ear normal.  Nose: Nose normal.  Mouth/Throat: Oropharynx is clear and moist. No oropharyngeal exudate.  Eyes: Conjunctivae are normal. Pupils are equal, round, and  reactive to light. Right eye exhibits no discharge. Left eye exhibits no discharge. No scleral icterus.  Neck: Normal range of motion. Neck supple. No tracheal deviation present. No thyromegaly present.  Cardiovascular: Normal rate, regular rhythm, normal heart sounds and intact distal pulses.  Exam reveals no gallop and no friction rub.   No murmur heard. Pulmonary/Chest: Effort normal and breath sounds normal. No accessory muscle usage. Not tachypneic. No respiratory distress. She has no decreased breath sounds. She has no wheezes. She has no rhonchi. She has no rales. She exhibits no tenderness.  Musculoskeletal: Normal range of motion. She exhibits no edema and no tenderness.  Lymphadenopathy:    She has no cervical adenopathy.  Neurological: She is alert and oriented to person, place, and time. No cranial nerve deficit. She exhibits normal muscle tone. Coordination normal.  Skin: Skin is warm and dry. No rash noted. She is not diaphoretic. No erythema. No pallor.  Psychiatric: She has a normal mood and affect. Her behavior is normal. Judgment and thought content normal.          Assessment & Plan:

## 2012-10-31 NOTE — Assessment & Plan Note (Signed)
Symptoms initially resolved with use of Fluconazole, however now having some dysphagia. Question if she may have esophageal candidiasis. Will have her follow up with Dr. Jenne Campus first. Question if laryngoscopy might be helpful for direct visualization and culture.

## 2012-10-31 NOTE — Assessment & Plan Note (Signed)
Pt reports BG well controlled. Will check A1c today through [facility]. Continue current medications.

## 2012-11-11 ENCOUNTER — Encounter: Payer: Self-pay | Admitting: Internal Medicine

## 2012-11-13 ENCOUNTER — Encounter: Payer: Self-pay | Admitting: Gastroenterology

## 2012-11-13 ENCOUNTER — Telehealth: Payer: Self-pay | Admitting: Gastroenterology

## 2012-11-13 NOTE — Telephone Encounter (Signed)
rec'd records from [facility] 2 pages Forward to Dr.Patterson

## 2012-11-14 ENCOUNTER — Encounter: Payer: Self-pay | Admitting: Gastroenterology

## 2012-11-25 ENCOUNTER — Ambulatory Visit (INDEPENDENT_AMBULATORY_CARE_PROVIDER_SITE_OTHER): Payer: 59 | Admitting: Gastroenterology

## 2012-11-25 ENCOUNTER — Encounter: Payer: Self-pay | Admitting: Gastroenterology

## 2012-11-25 VITALS — BP 120/80 | HR 100 | Ht 64.0 in | Wt 173.2 lb

## 2012-11-25 DIAGNOSIS — R1310 Dysphagia, unspecified: Secondary | ICD-10-CM

## 2012-11-25 DIAGNOSIS — K219 Gastro-esophageal reflux disease without esophagitis: Secondary | ICD-10-CM

## 2012-11-25 DIAGNOSIS — R05 Cough: Secondary | ICD-10-CM

## 2012-11-25 DIAGNOSIS — E119 Type 2 diabetes mellitus without complications: Secondary | ICD-10-CM

## 2012-11-25 MED ORDER — NA SULFATE-K SULFATE-MG SULF 17.5-3.13-1.6 GM/177ML PO SOLN: ORAL | Status: DC

## 2012-11-25 NOTE — Progress Notes (Signed)
History of Present Illness:  This is a 62 year old Caucasian female referred for possible endoscopy because of progressive solid food dysphagia.  The past 2 years she's had recurrent coughing, throat clearing, and is required several courses of prednisone and antibiotics apparently has had several episodes of Candida esophagitis.  She recently was treated with Diflucan and Magic mouthwash and continues with coughing, throat clearing, and intermittent solid food dysphagia in her upper throat area.  She's not had endoscopy or barium swallows.  ENT examination apparently has been unremarkable.  She denies typical acid reflux symptoms but is been on Prilosec for the last year.  Past history is remarkable for thyroid nodule which apparently was benign, and mild adult onset diabetes managed with oral medications.  She's had a mild weight loss associated with her dysphagia but nothing significant.  She does not smoke or abuse alcohol.  Family history is noncontributory.  Last colonoscopy was 10 years ago.  I have reviewed this patient's present history, medical and surgical past history, allergies and medications.     ROS:   All systems were reviewed and are negative unless otherwise stated in the HPI.    Physical Exam: Blood pressure 120/80, pulse 100, and weight 173 with a BMI of 29.72.  Examination of oropharynx is unremarkable.  I cannot appreciate thyromegaly or any lymphadenopathy. General well developed well nourished patient in no acute distress, appearing their stated age Eyes PERRLA, no icterus, fundoscopic exam per opthamologist Skin no lesions noted Neck supple, no adenopathy, no thyroid enlargement, no tenderness Chest clear to percussion and auscultation Heart no significant murmurs, gallops or rubs noted Abdomen no hepatosplenomegaly masses or tenderness, BS normal.  Extremities no acute joint lesions, edema, phlebitis or evidence of cellulitis. Neurologic patient oriented x 3, cranial  nerves intact, no focal neurologic deficits noted. Psychological mental status normal and normal affect.  Assessment and plan: Probable esophageal stricture from acid reflux , rule out eosinophilic esophagitis, Candida esophagitis, or Barrett's mucosa.  I've scheduled her for endoscopy ASAP and we also will do her 10 year followup colonoscopy.  Have not changed her medications pending endoscopic exam.  We will make adjustments in her medications for her procedures in terms of her diabetes.

## 2012-11-25 NOTE — Addendum Note (Signed)
Addended by: Ok Anis A on: 11/25/2012 09:59 AM   Modules accepted: Orders

## 2012-11-25 NOTE — Patient Instructions (Signed)
  You have been scheduled for an endoscopy and colonoscopy with propofol. Please follow the written instructions given to you at your visit today. Please pick up your prep at the pharmacy within the next 1-3 days. If you use inhalers (even only as needed), please bring them with you on the day of your procedure. Your physician has requested that you go to www.[email or web address].com and enter the access code given to you at your visit today. This web site gives a general overview about your procedure. However, you should still follow specific instructions given to you by our office regarding your preparation for the procedure.  _______________________________________________________________________                                               We are excited to introduce MyChart, a new best-in-class service that provides you online access to important information in your electronic medical record. We want to make it easier for you to view your health information - all in one secure location - when and where you need it. We expect MyChart will enhance the quality of care and service we provide.  When you register for MyChart, you can:    View your test results.    Request appointments and receive appointment reminders via email.    Request medication renewals.    View your medical history, allergies, medications and immunizations.    Communicate with your physician's office through a password-protected site.    Conveniently print information such as your medication lists.  To find out if MyChart is right for you, please talk to a member of our clinical staff today. We will gladly answer your questions about this free health and wellness tool.  If you are age 18 or older and want a member of your family to have access to your record, you must provide written consent by completing a proxy form available at our office. Please speak to our clinical staff about guidelines regarding accounts for patients  younger than age 18.  As you activate your MyChart account and need any technical assistance, please call the MyChart technical support line at [phone] ([phone]) or email your question to [email or web address]. If you email your question(s), please include your name, a return phone number and the best time to reach you.  If you have non-urgent health-related questions, you can send a message to our office through MyChart at [email or web address]. If you have a medical emergency, call 911.  Thank you for using MyChart as your new health and wellness resource!   MyChart licensed from [employer],  1999-2010. Patents Pending.   

## 2012-11-27 ENCOUNTER — Encounter: Payer: Self-pay | Admitting: Internal Medicine

## 2012-11-29 ENCOUNTER — Ambulatory Visit (AMBULATORY_SURGERY_CENTER): Payer: 59 | Admitting: Gastroenterology

## 2012-11-29 ENCOUNTER — Encounter: Payer: Self-pay | Admitting: Gastroenterology

## 2012-11-29 VITALS — BP 161/85 | HR 70 | Temp 98.4°F | Resp 21 | Ht 64.0 in | Wt 173.0 lb

## 2012-11-29 DIAGNOSIS — K2990 Gastroduodenitis, unspecified, without bleeding: Secondary | ICD-10-CM

## 2012-11-29 DIAGNOSIS — D126 Benign neoplasm of colon, unspecified: Secondary | ICD-10-CM

## 2012-11-29 DIAGNOSIS — K219 Gastro-esophageal reflux disease without esophagitis: Secondary | ICD-10-CM

## 2012-11-29 DIAGNOSIS — K5730 Diverticulosis of large intestine without perforation or abscess without bleeding: Secondary | ICD-10-CM

## 2012-11-29 DIAGNOSIS — R05 Cough: Secondary | ICD-10-CM

## 2012-11-29 DIAGNOSIS — R1310 Dysphagia, unspecified: Secondary | ICD-10-CM

## 2012-11-29 DIAGNOSIS — K2970 Gastritis, unspecified, without bleeding: Secondary | ICD-10-CM

## 2012-11-29 DIAGNOSIS — Z1211 Encounter for screening for malignant neoplasm of colon: Secondary | ICD-10-CM

## 2012-11-29 MED ORDER — SODIUM CHLORIDE 0.9 % IV SOLN: 500.0000 mL | INTRAVENOUS | Status: DC

## 2012-11-29 NOTE — Patient Instructions (Addendum)
YOU HAD AN ENDOSCOPIC PROCEDURE TODAY AT THE [facility]: Refer to the procedure report that was given to you for any specific questions about what was found during the examination.  If the procedure report does not answer your questions, please call your gastroenterologist to clarify.  If you requested that your care partner not be given the details of your procedure findings, then the procedure report has been included in a sealed envelope for you to review at your convenience later.  YOU SHOULD EXPECT: Some feelings of bloating in the abdomen. Passage of more gas than usual.  Walking can help get rid of the air that was put into your GI tract during the procedure and reduce the bloating. If you had a lower endoscopy (such as a colonoscopy or flexible sigmoidoscopy) you may notice spotting of blood in your stool or on the toilet paper. If you underwent a bowel prep for your procedure, then you may not have a normal bowel movement for a few days.  DIET: Your first meal following the procedure should be a light meal and then it is ok to progress to your normal diet.  A half-sandwich or bowl of soup is an example of a good first meal.  Heavy or fried foods are harder to digest and may make you feel nauseous or bloated.  Likewise meals heavy in dairy and vegetables can cause extra gas to form and this can also increase the bloating.  Drink plenty of fluids but you should avoid alcoholic beverages for 24 hours.  ACTIVITY: Your care partner should take you home directly after the procedure.  You should plan to take it easy, moving slowly for the rest of the day.  You can resume normal activity the day after the procedure however you should NOT DRIVE or use heavy machinery for 24 hours (because of the sedation medicines used during the test).    SYMPTOMS TO REPORT IMMEDIATELY: A gastroenterologist can be reached at any hour.  During normal business hours, 8:30 AM to 5:00 PM Monday through Friday,  call [phone].  After hours and on weekends, please call the GI answering service at [phone] who will take a message and have the physician on call contact you.   Following lower endoscopy (colonoscopy or flexible sigmoidoscopy):  Excessive amounts of blood in the stool  Significant tenderness or worsening of abdominal pains  Swelling of the abdomen that is new, acute  Fever of 100F or higher  Following upper endoscopy (EGD)  Vomiting of blood or coffee ground material  New chest pain or pain under the shoulder blades  Painful or persistently difficult swallowing  New shortness of breath  Fever of 100F or higher  Black, tarry-looking stools  FOLLOW UP: If any biopsies were taken you will be contacted by phone or by letter within the next 1-3 weeks.  Call your gastroenterologist if you have not heard about the biopsies in 3 weeks.  Our staff will call the home number listed on your records the next business day following your procedure to check on you and address any questions or concerns that you may have at that time regarding the information given to you following your procedure. This is a courtesy call and so if there is no answer at the home number and we have not heard from you through the emergency physician on call, we will assume that you have returned to your regular daily activities without incident.  SIGNATURES/CONFIDENTIALITY: You and/or your care   partner have signed paperwork which will be entered into your electronic medical record.  These signatures attest to the fact that that the information above on your After Visit Summary has been reviewed and is understood.  Full responsibility of the confidentiality of this discharge information lies with you and/or your care-partner.  Recommendations See procedure report 

## 2012-11-29 NOTE — Op Note (Signed)
[facility] [address] [place], [address]   COLONOSCOPY PROCEDURE REPORT  PATIENT: Whitney Knight, Whitney Knight  MR#: [id number] BIRTHDATE: 1951/01/30 , 62  yrs. old GENDER: Female ENDOSCOPIST: Mardella Layman, MD, [facility] REFERRED BY: PROCEDURE DATE:  11/29/2012 PROCEDURE:   Colonoscopy with biopsy First Screening Colonoscopy - Avg.  risk and is 50 yrs.  old or older - No.      History of Adenoma - Now for follow-up colonoscopy & has been > or = to 3 yrs.  N/A  Polyps Removed Today? Yes. ASA CLASS:   Class III INDICATIONS:average risk screening. MEDICATIONS: propofol (Diprivan) 150mg  IV  DESCRIPTION OF PROCEDURE:   After the risks benefits and alternatives of the procedure were thoroughly explained, informed consent was obtained.  A digital rectal exam revealed no abnormalities of the rectum.   The LB [id number]  endoscope was introduced through the anus and advanced to the cecum, which was identified by both the appendix and ileocecal valve. No adverse events experienced.   The quality of the prep was excellent, using MoviPrep  The instrument was then slowly withdrawn as the colon was fully examined.      COLON FINDINGS: Mild diverticulosis was noted in the sigmoid colon. A smooth flat polyp ranging between 3-38mm in size was found in the rectum.  Multiple biopsies were performed using cold forceps.   The colon was otherwise normal.  There was no diverticulosis, inflammation, polyps or cancers unless previously stated. Retroflexed views revealed no abnormalities. The time to cecum=4 minutes 300 seconds.  Withdrawal time=6 minutes 14 seconds.  The scope was withdrawn and the procedure completed. COMPLICATIONS: There were no complications.  ENDOSCOPIC IMPRESSION: 1.   Mild diverticulosis was noted in the sigmoid colon 2.   Flat polyp ranging between 3-89mm in size was found in the rectum; multiple biopsies were performed using cold forceps 3.   The colon  was otherwise normal  RECOMMENDATIONS: 1.  Repeat colonoscopy in 5 years if polyp adenomatous; otherwise 10 years 2.  High fiber diet with liberal fluid intake.   eSigned:  Mardella Layman, MD, [facility] 11/29/2012 2:02 PM   cc:

## 2012-11-29 NOTE — Progress Notes (Signed)
Called to room to assist during endoscopic procedure.  Patient ID and intended procedure confirmed with present staff. Received instructions for my participation in the procedure from the performing physician.  

## 2012-11-29 NOTE — Progress Notes (Signed)
Patient did not have preoperative order for IV antibiotic SSI prophylaxis. (G8918)  Patient did not experience any of the following events: a burn prior to discharge; a fall within the facility; wrong site/side/patient/procedure/implant event; or a hospital transfer or hospital admission upon discharge from the facility. (G8907)  

## 2012-11-29 NOTE — Op Note (Signed)
[facility] [address] [place], [address]   ENDOSCOPY PROCEDURE REPORT  PATIENT: Whitney Knight, Whitney Knight  MR#: [id number] BIRTHDATE: 1950/10/24 , 62  yrs. old GENDER: Female ENDOSCOPIST:David Hale Bogus, MD, [facility] REFERRED BY: Ronna Polio, M.D. PROCEDURE DATE:  11/29/2012 PROCEDURE:   EGD w/ biopsy and Maloney dilation of esophagus ASA CLASS:    Class III INDICATIONS: History of reflux esophagitis and Dysphagia. MEDICATION: There was residual sedation effect present from prior procedure and propofol (Diprivan) 150mg  IV TOPICAL ANESTHETIC:   Cetacaine Spray  DESCRIPTION OF PROCEDURE:   After the risks and benefits of the procedure were explained, informed consent was obtained.  The LB [id number]  endoscope was introduced through the mouth  and advanced to the second portion of the duodenum .  The instrument was slowly withdrawn as the mucosa was fully examined.      DUODENUM: The duodenal mucosa showed no abnormalities in the bulb and second portion of the duodenum.  STOMACH: There was mild antral gastropathy noted.  Cold forcep biopsies were taken at the antrum.  ESOPHAGUS: The mucosa of the esophagus appeared normal. she was dilated to #54 [place] dilator.  There was no bleeding or chest pain from this procedure.  Emanation of the soft is otherwise showed no mucosal polypoid lesions.   Retroflexed views revealed no abnormalities.    The scope was then withdrawn from the patient and the procedure completed.  COMPLICATIONS: There were no complications.   ENDOSCOPIC IMPRESSION: 1.   The duodenal mucosa showed no abnormalities in the bulb and second portion of the duodenum 2.   There was mild antral gastropathy noted [T2] ..rule out H. pylori 3.   The mucosa of the esophagus appeared normal ..probable GERD and occult stricture.  RECOMMENDATIONS: 1.  Rx CLO if positive 2.  Continue PPI 3.  Anti-reflux regimen to be follow 4.   if  dysphagia persists, will schedule high-resolution esophageal manometry.   _______________________________ eSignedMardella Layman, MD, [facility] 11/29/2012 2:09 PM   antireflux

## 2012-11-29 NOTE — Progress Notes (Signed)
No egg or soy allergy. ewm No problems w/past sedation. ewm

## 2012-12-02 ENCOUNTER — Telehealth: Payer: Self-pay | Admitting: *Deleted

## 2012-12-02 NOTE — Telephone Encounter (Signed)
  Follow up Call-  Call back number 11/29/2012  Post procedure Call Back phone  # [phone]  Permission to leave phone message Yes     Patient questions:  Do you have a fever, pain , or abdominal swelling? no Pain Score  0 *  Have you tolerated food without any problems? yes  Have you been able to return to your normal activities? yes  Do you have any questions about your discharge instructions: Diet   no Medications  no Follow up visit  no  Do you have questions or concerns about your Care? no  Actions: * If pain score is 4 or above: No action needed, pain <4.

## 2012-12-03 ENCOUNTER — Encounter: Payer: Self-pay | Admitting: Gastroenterology

## 2012-12-05 ENCOUNTER — Encounter: Payer: Self-pay | Admitting: Gastroenterology

## 2012-12-09 ENCOUNTER — Encounter: Payer: Self-pay | Admitting: Gastroenterology

## 2012-12-24 ENCOUNTER — Encounter: Payer: Self-pay | Admitting: Internal Medicine

## 2012-12-24 DIAGNOSIS — E039 Hypothyroidism, unspecified: Secondary | ICD-10-CM

## 2012-12-25 ENCOUNTER — Encounter: Payer: Self-pay | Admitting: *Deleted

## 2012-12-25 ENCOUNTER — Encounter: Payer: Self-pay | Admitting: Internal Medicine

## 2012-12-25 MED ORDER — LEVOTHYROXINE SODIUM 75 MCG PO TABS: 75.0000 ug | ORAL_TABLET | Freq: Every day | ORAL | Status: DC

## 2013-01-17 ENCOUNTER — Other Ambulatory Visit: Payer: Self-pay | Admitting: Internal Medicine

## 2013-01-17 NOTE — Telephone Encounter (Signed)
Eprescribed.

## 2013-01-31 ENCOUNTER — Ambulatory Visit (INDEPENDENT_AMBULATORY_CARE_PROVIDER_SITE_OTHER): Payer: 59 | Admitting: Internal Medicine

## 2013-01-31 ENCOUNTER — Encounter: Payer: Self-pay | Admitting: Internal Medicine

## 2013-01-31 ENCOUNTER — Ambulatory Visit: Payer: 59 | Admitting: Internal Medicine

## 2013-01-31 ENCOUNTER — Ambulatory Visit (INDEPENDENT_AMBULATORY_CARE_PROVIDER_SITE_OTHER)
Admission: RE | Admit: 2013-01-31 | Discharge: 2013-01-31 | Disposition: A | Discharge status: Home / Self Care | Payer: 59 | Source: Ambulatory Visit | Attending: Internal Medicine | Admitting: Internal Medicine

## 2013-01-31 VITALS — BP 120/80 | HR 80 | Temp 98.5°F | Wt 170.0 lb

## 2013-01-31 DIAGNOSIS — I10 Essential (primary) hypertension: Secondary | ICD-10-CM

## 2013-01-31 DIAGNOSIS — Z23 Encounter for immunization: Secondary | ICD-10-CM

## 2013-01-31 DIAGNOSIS — J302 Other seasonal allergic rhinitis: Secondary | ICD-10-CM | POA: Insufficient documentation

## 2013-01-31 DIAGNOSIS — E1165 Type 2 diabetes mellitus with hyperglycemia: Secondary | ICD-10-CM

## 2013-01-31 DIAGNOSIS — J309 Allergic rhinitis, unspecified: Secondary | ICD-10-CM

## 2013-01-31 DIAGNOSIS — M79641 Pain in right hand: Secondary | ICD-10-CM

## 2013-01-31 DIAGNOSIS — E669 Obesity, unspecified: Secondary | ICD-10-CM

## 2013-01-31 DIAGNOSIS — M79609 Pain in unspecified limb: Secondary | ICD-10-CM

## 2013-01-31 DIAGNOSIS — R1310 Dysphagia, unspecified: Secondary | ICD-10-CM

## 2013-01-31 MED ORDER — FLUTICASONE PROPIONATE 50 MCG/ACT NA SUSP: 2.0000 | Freq: Every day | NASAL | Status: DC

## 2013-01-31 MED ORDER — PHENTERMINE HCL 15 MG PO CAPS: 15.0000 mg | ORAL_CAPSULE | Freq: Two times a day (BID) | ORAL | Status: DC

## 2013-01-31 NOTE — Assessment & Plan Note (Signed)
Patient recently underwent upper endoscopy and was found to have esophageal narrowing. She is now status post esophageal dilation in symptoms of dysphagia have completely resolved.

## 2013-01-31 NOTE — Assessment & Plan Note (Signed)
BP Readings from Last 3 Encounters:  01/31/13 120/80  11/29/12 161/85  11/25/12 120/80   Blood pressure well-controlled with losartan. We'll continue. Recent renal function checked at work was normal. Followup 3 months.

## 2013-01-31 NOTE — Assessment & Plan Note (Signed)
Symptoms well controlled with Flonase. Will continue. 

## 2013-01-31 NOTE — Progress Notes (Signed)
Subjective:    Patient ID: [name], female    DOB: 1950/07/07, 62 y.o.   MRN: [id number]  HPI 62 year old female with history of diabetes, hypertension, hypothyroidism, obesity presents for followup. In regards to diabetes, she reports that blood sugars have been slightly elevated. She notes some dietary indiscretion. Recent A1c performed at her work was 7.9%. She is compliant with medication.  In regards to recent episodes of dysphasia and cough, she reports symptoms are completely resolved after recent upper endoscopy and esophageal dilation.  She is concerned today about pain in her right hand. She reports that she fell in the middle of the night in August 2014. Since that time, she has had pain in the middle of her right hand. She is not sure if she landed on her hand. She has pain with movement of her middle finger. She also has pain with palpation of the middle of her palm. There is no swelling or redness. This has not yet been evaluated. She has not taken any medication for this.  Outpatient Encounter Prescriptions as of 01/31/2013  Medication Sig Dispense Refill  . aspirin 81 MG tablet Take 81 mg by mouth daily.        . Betamethasone Valerate 0.12 % foam Apply topically 2 (two) times daily. Uses as needed      . Casanthranol-Docusate Sodium 30-100 MG CAPS Take 1 tablet by mouth daily.  90 each  3  . cholecalciferol (VITAMIN D) 1000 UNITS tablet Take 1,000 Units by mouth daily.      . fish oil-omega-3 fatty acids 1000 MG capsule Take 1 g by mouth daily.        . fluticasone (FLONASE) 50 MCG/ACT nasal spray Place 2 sprays into the nose daily.  48 g  3  . glimepiride (AMARYL) 4 MG tablet TAKE 1 TABLET BY MOUTH DAILY  90 tablet  3  . levothyroxine (SYNTHROID, LEVOTHROID) 75 MCG tablet Take 1 tablet (75 mcg total) by mouth daily.  90 tablet  1  . losartan (COZAAR) 50 MG tablet TAKE 1 TABLET BY MOUTH ONCE A DAY  90 tablet  3  . metFORMIN (GLUCOPHAGE) 1000 MG tablet Take 1 tablet  (1,000 mg total) by mouth 2 (two) times daily with a meal.  180 tablet  3  . omeprazole (PRILOSEC) 20 MG capsule Take 20 mg by mouth daily.      . ONE TOUCH ULTRA TEST test strip Use 1 to check blood sugar  once daily and as needed  100 each  3  . ONETOUCH DELICA LANCETS 33G MISC Use 1 daily as directed  100 each  3  . oxybutynin (DITROPAN-XL) 5 MG 24 hr tablet TAKE 1 TABLET BY MOUTH ONCE A DAY  90 tablet  3  . phentermine 15 MG capsule Take 1 capsule (15 mg total) by mouth 2 (two) times daily.  60 capsule  1   No facility-administered encounter medications on file as of 01/31/2013.   BP 120/80  Pulse 80  Temp(Src) 98.5 F (36.9 C) (Oral)  Wt 170 lb (77.111 kg)  BMI 29.17 kg/m2  SpO2 97%  Review of Systems  Constitutional: Negative for fever, chills, appetite change, fatigue and unexpected weight change.  HENT: Negative for ear pain, congestion, sore throat, trouble swallowing, neck pain, voice change and sinus pressure.   Eyes: Negative for visual disturbance.  Respiratory: Negative for cough, shortness of breath, wheezing and stridor.   Cardiovascular: Negative for chest pain, palpitations and leg swelling.  Gastrointestinal: Negative for nausea, vomiting, abdominal pain, diarrhea, constipation, blood in stool, abdominal distention and anal bleeding.  Genitourinary: Negative for dysuria and flank pain.  Musculoskeletal: Positive for arthralgias. Negative for myalgias and gait problem.  Skin: Negative for color change and rash.  Neurological: Negative for dizziness and headaches.  Hematological: Negative for adenopathy. Does not bruise/bleed easily.  Psychiatric/Behavioral: Negative for suicidal ideas, sleep disturbance and dysphoric mood. The patient is not nervous/anxious.        Objective:   Physical Exam  Constitutional: She is oriented to person, place, and time. She appears well-developed and well-nourished. No distress.  HENT:  Head: Normocephalic and atraumatic.  Right  Ear: External ear normal.  Left Ear: External ear normal.  Nose: Nose normal.  Mouth/Throat: Oropharynx is clear and moist. No oropharyngeal exudate.  Eyes: Conjunctivae are normal. Pupils are equal, round, and reactive to light. Right eye exhibits no discharge. Left eye exhibits no discharge. No scleral icterus.  Neck: Normal range of motion. Neck supple. No tracheal deviation present. No thyromegaly present.  Cardiovascular: Normal rate, regular rhythm, normal heart sounds and intact distal pulses.  Exam reveals no gallop and no friction rub.   No murmur heard. Pulmonary/Chest: Effort normal and breath sounds normal. No accessory muscle usage. Not tachypneic. No respiratory distress. She has no decreased breath sounds. She has no wheezes. She has no rhonchi. She has no rales. She exhibits no tenderness.  Musculoskeletal: Normal range of motion. She exhibits no edema.       Right hand: She exhibits tenderness and bony tenderness.       Hands: Lymphadenopathy:    She has no cervical adenopathy.  Neurological: She is alert and oriented to person, place, and time. No cranial nerve deficit. She exhibits normal muscle tone. Coordination normal.  Skin: Skin is warm and dry. No rash noted. She is not diaphoretic. No erythema. No pallor.  Psychiatric: She has a normal mood and affect. Her behavior is normal. Judgment and thought content normal.          Assessment & Plan:

## 2013-01-31 NOTE — Assessment & Plan Note (Signed)
Lab Results  Component Value Date   HGBA1C 8.0* 05/13/2012   A1c recently checked at work was 7.9%. Patient notes some dietary indiscretion. Encouraged better compliance with diet low in processed carbohydrates. We'll continue current medications. Plan to recheck A1c in 3 months. Foot exam normal today.

## 2013-01-31 NOTE — Assessment & Plan Note (Signed)
Right hand pain, palmar surface, status post recent fall. Plain x-ray today is normal with no evidence of fracture. Will set up evaluation with orthopedics.

## 2013-01-31 NOTE — Assessment & Plan Note (Signed)
Wt Readings from Last 3 Encounters:  01/31/13 170 lb (77.111 kg)  11/29/12 173 lb (78.472 kg)  11/25/12 173 lb 3.2 oz (78.563 kg)   Congratulated patient on weight loss. Will continue phentermine 15 mg daily. She occasionally takes a second dose midday if having increased appetite. Follow up 3 months and prn.

## 2013-02-01 ENCOUNTER — Encounter: Payer: Self-pay | Admitting: Internal Medicine

## 2013-02-03 ENCOUNTER — Encounter: Payer: Self-pay | Admitting: Emergency Medicine

## 2013-02-27 HISTORY — PX: CARPAL TUNNEL RELEASE: SHX101

## 2013-02-27 HISTORY — PX: TRIGGER FINGER RELEASE: SHX641

## 2013-03-17 ENCOUNTER — Other Ambulatory Visit: Payer: Self-pay | Admitting: Family Medicine

## 2013-03-17 NOTE — Telephone Encounter (Signed)
Eprescribed.

## 2013-04-11 ENCOUNTER — Telehealth: Payer: Self-pay | Admitting: Internal Medicine

## 2013-04-11 ENCOUNTER — Encounter: Payer: Self-pay | Admitting: Internal Medicine

## 2013-04-11 ENCOUNTER — Ambulatory Visit (INDEPENDENT_AMBULATORY_CARE_PROVIDER_SITE_OTHER): Payer: 59 | Admitting: Internal Medicine

## 2013-04-11 VITALS — BP 146/72 | HR 84 | Temp 97.8°F | Resp 12 | Wt 170.5 lb

## 2013-04-11 DIAGNOSIS — J0100 Acute maxillary sinusitis, unspecified: Secondary | ICD-10-CM | POA: Insufficient documentation

## 2013-04-11 MED ORDER — LEVOFLOXACIN 500 MG PO TABS: 500.0000 mg | ORAL_TABLET | Freq: Every day | ORAL | Status: DC

## 2013-04-11 MED ORDER — PREDNISONE (PAK) 10 MG PO TABS: ORAL_TABLET | ORAL | Status: DC

## 2013-04-11 NOTE — Telephone Encounter (Signed)
I have not received this yet

## 2013-04-11 NOTE — Progress Notes (Signed)
Patient ID: Whitney Knight, female   DOB: Feb 13, 1951, 62 y.o.   MRN: [id number]  Patient Active Problem List   Diagnosis Date Noted  . Sinusitis, acute maxillary 04/11/2013  . Seasonal allergies 01/31/2013  . Right hand pain 01/31/2013  . Dysphagia, unspecified(787.20) 10/31/2012  . Candidiasis of mouth 10/03/2012  . Diabetes mellitus type 2, uncontrolled 05/23/2012  . Obesity 03/19/2012  . Cough 02/13/2011  . ALLERGIC RHINITIS CAUSE UNSPECIFIED 06/08/2010  . Hypothyroidism 01/14/2009  . VITAMIN D DEFICIENCY 01/14/2009  . Hypertension 01/14/2009    Subjective:  CC:   Chief Complaint  Patient presents with  . Sinusitis    sinus congesion, headache x 1.5 weeks    HPI:   Whitney Knight a 62 y.o. female who presents with a  10 day history of sinus drainage and headaches every day despite regular use of sinus rinses and  Flonase.  Today her headache became more severe, and light sensitive.   She is getting dizzy with sudden changes in position.  No fevers.  He is having facial pain and pressure behind the eyes  Has not taken abx in 6 months.  PCN allergy   Has been using the sinus rinses regularly since  her sinus surgery 2 years    Past Medical History  Diagnosis Date  . Hypertension   . Diabetes mellitus   . Thyroid disease   . Chronic sinusitis   . Headache(784.0)     [facility] in past    Past Surgical History  Procedure Laterality Date  . Nasal sinus surgery  06/2008  . Vaginal delivery      2  . Dental surgery    . Dental implants    . Bone graft      for dental surgery  . Colonoscopy  2014  . Carpal tunnel release Right 103014    [facility]  . Trigger finger release Right [id number]    [facility]       The following portions of the patient's history were reviewed and updated as appropriate: Allergies, current medications, and problem list.    Review of Systems:   Patient denies headache, fevers, malaise, unintentional weight loss, skin rash, eye  pain, sinus congestion and sinus pain, sore throat, dysphagia,  hemoptysis , cough, dyspnea, wheezing, chest pain, palpitations, orthopnea, edema, abdominal pain, nausea, melena, diarrhea, constipation, flank pain, dysuria, hematuria, urinary  Frequency, nocturia, numbness, tingling, seizures,  Focal weakness, Loss of consciousness,  Tremor, insomnia, depression, anxiety, and suicidal ideation.     History   Social History  . Marital Status: Married    Spouse Name: N/A    Number of Children: 2  . Years of Education: N/A   Occupational History  . [employer]    Social History Main Topics  . Smoking status: Never Smoker   . Smokeless tobacco: Never Used  . Alcohol Use: Yes     Comment: occ  . Drug Use: No  . Sexual Activity: Not on file   Other Topics Concern  . Not on file   Social History Narrative   Lives with husband. No pets, 2 children.      Work - [employer]      Diet - regular   Exercise - none presently    Objective:  Filed Vitals:   04/11/13 1022  BP: 146/72  Pulse: 84  Temp: 97.8 F (36.6 C)  Resp: 12     General appearance: alert, cooperative and appears stated age Ears: normal TM's  and external ear canals both ears Face: bilateral frontal sinus tenderness  Throat: lips, mucosa, and tongue normal; teeth and gums normal Neck: no adenopathy, no carotid bruit, supple, symmetrical, trachea midline and thyroid not enlarged, symmetric, no tenderness/mass/nodules Back: symmetric, no curvature. ROM normal. No CVA tenderness. Lungs: clear to auscultation bilaterally Heart: regular rate and rhythm, S1, S2 normal, no murmur, click, rub or gallop Abdomen: soft, non-tender; bowel sounds normal; no masses,  no organomegaly Pulses: 2+ and symmetric Skin: Skin color, texture, turgor normal. No rashes or lesions Lymph nodes: Cervical, supraclavicular, and axillary nodes normal.  Assessment and Plan:  Sinusitis, acute maxillary Given chronicity of  symptoms, development of facial pain and exam consistent with bacterial URI,  Will treat with empiric antibiotics, predniosne taper , decongestants, and saline lavage.     Updated Medication List Outpatient Encounter Prescriptions as of 04/11/2013  Medication Sig  . aspirin 81 MG tablet Take 81 mg by mouth daily.    Jennette Banker Sodium 30-100 MG CAPS Take 1 tablet by mouth daily.  . cholecalciferol (VITAMIN D) 1000 UNITS tablet Take 1,000 Units by mouth daily.  . fish oil-omega-3 fatty acids 1000 MG capsule Take 1 g by mouth daily.    . fluticasone (FLONASE) 50 MCG/ACT nasal spray Place 2 sprays into the nose daily.  [name] glimepiride (AMARYL) 4 MG tablet Take 1 tablet by mouth  daily  . levothyroxine (SYNTHROID, LEVOTHROID) 75 MCG tablet Take 1 tablet (75 mcg total) by mouth daily.  [name] losartan (COZAAR) 50 MG tablet TAKE 1 TABLET BY MOUTH ONCE A DAY  . metFORMIN (GLUCOPHAGE) 1000 MG tablet Take 1 tablet (1,000 mg total) by mouth 2 (two) times daily with a meal.  . omeprazole (PRILOSEC) 20 MG capsule Take 20 mg by mouth daily.  . ONE TOUCH ULTRA TEST test strip Use 1 to check blood sugar  once daily and as needed  . ONETOUCH DELICA LANCETS 33G MISC Use 1 daily as directed  . oxybutynin (DITROPAN-XL) 5 MG 24 hr tablet TAKE 1 TABLET BY MOUTH ONCE A DAY  . phentermine 15 MG capsule Take 15 mg by mouth daily.  . [DISCONTINUED] phentermine 15 MG capsule Take 1 capsule (15 mg total) by mouth 2 (two) times daily.  [name] levofloxacin (LEVAQUIN) 500 MG tablet Take 1 tablet (500 mg total) by mouth daily.  . predniSONE (STERAPRED UNI-PAK) 10 MG tablet 6 tablets on Day 1 , then reduce by 1 tablet daily until gone  . [DISCONTINUED] Betamethasone Valerate 0.12 % foam Apply topically 2 (two) times daily. Uses as needed     No orders of the defined types were placed in this encounter.    No Follow-up on file.

## 2013-04-11 NOTE — Telephone Encounter (Signed)
Pt has a question about life insurance forms. She wanted to know if we have received that yet ?? I looked in the book up front and don't see anything that has been faxed over that is requesting notes ??? Wasn't sure if it was back there with Dr. Dan Humphreys ??? I printed out pt's notes and labs and gave those to her and had her sign a medical release just incase but she says she was going to have her husband call and give [place] some information on how to find what the insurance company is needing.

## 2013-04-11 NOTE — Telephone Encounter (Signed)
Pt saw Dr. Darrick Huntsman today

## 2013-04-11 NOTE — Telephone Encounter (Signed)
Patient called in regarding sinus infection symptoms. Attempted to call her back to triage symptoms, unable to reach patient. Left message with instructions to call the office.

## 2013-04-11 NOTE — Progress Notes (Signed)
Pre visit review using our clinic review tool, if applicable. No additional management support is needed unless otherwise documented below in the visit note. 

## 2013-04-11 NOTE — Patient Instructions (Signed)
I am prescribing an antibiotic (levaquin) and prednisone taper  To manage the infection and the inflammation in your ear/sinuses.   I also advise use of the following OTC meds to help with your other symptoms.   Take generic OTC benadryl 25 mg every 8 hours for the drainage,  Sudafed PE  10 to 30 mg every 8 hours for the congestion, you may substitute Afrin nasal spray for the nighttime dose of sudafed PE  If needed to prevent insomnia.  flush your sinuses twice daily with Simply Saline (do over the sink because if you do it right you will spit out globs of mucus) but suspend the flonase for now.   Please take a probiotic ( Align, Floraque or Culturelle) while you are on the antibiotic to prevent a serious antibiotic associated diarrhea  Called clostrudium dificile colitis and a vaginal yeast infection

## 2013-04-11 NOTE — Telephone Encounter (Signed)
Left message have not received form yet.

## 2013-04-11 NOTE — Telephone Encounter (Signed)
Left message pt to call back

## 2013-04-13 ENCOUNTER — Encounter: Payer: Self-pay | Admitting: Internal Medicine

## 2013-04-13 NOTE — Assessment & Plan Note (Addendum)
Given chronicity of symptoms, development of facial pain and exam consistent with bacterial URI,  Will treat with empiric antibiotics, predniosne taper , decongestants, and saline lavage.

## 2013-04-18 ENCOUNTER — Encounter: Payer: Self-pay | Admitting: Internal Medicine

## 2013-04-18 MED ORDER — MAGIC MOUTHWASH: 5.0000 mL | Freq: Three times a day (TID) | ORAL | Status: DC | PRN

## 2013-04-20 LAB — HM MAMMOGRAPHY: HM MAMMO: NORMAL

## 2013-04-20 LAB — HM PAP SMEAR: HM PAP: NORMAL

## 2013-04-21 ENCOUNTER — Ambulatory Visit (INDEPENDENT_AMBULATORY_CARE_PROVIDER_SITE_OTHER): Payer: 59 | Admitting: Internal Medicine

## 2013-04-21 ENCOUNTER — Encounter: Payer: Self-pay | Admitting: Internal Medicine

## 2013-04-21 VITALS — BP 150/96 | HR 78 | Temp 98.3°F | Wt 170.0 lb

## 2013-04-21 DIAGNOSIS — J0100 Acute maxillary sinusitis, unspecified: Secondary | ICD-10-CM

## 2013-04-21 DIAGNOSIS — IMO0002 Reserved for concepts with insufficient information to code with codable children: Secondary | ICD-10-CM

## 2013-04-21 DIAGNOSIS — E1165 Type 2 diabetes mellitus with hyperglycemia: Secondary | ICD-10-CM

## 2013-04-21 MED ORDER — DOXYCYCLINE HYCLATE 100 MG PO TABS: 100.0000 mg | ORAL_TABLET | Freq: Two times a day (BID) | ORAL | Status: DC

## 2013-04-21 NOTE — Assessment & Plan Note (Addendum)
Symptoms and exam are consistent with persistent acute maxillary sinusitis. No improvement with prednisone taper and Levaquin. Question resistant bacterial infection versus fungal infection. Pt at increased risk of resistant infection given h/o DM. Will try changing to doxycycline for better staph coverage. If no improvement in symptoms, we discussed referral to ENT for further evaluation and direct visualization to look for obstruction, and culture.  Over of which >50% spent in face-to-face contact with patient discussing plan of care

## 2013-04-21 NOTE — Progress Notes (Signed)
Pre-visit discussion using our clinic review tool. No additional management support is needed unless otherwise documented below in the visit note.  

## 2013-04-21 NOTE — Patient Instructions (Signed)
Call or email on Wednesday with update.

## 2013-04-21 NOTE — Progress Notes (Signed)
**Note Whitney-Identified via Obfuscation** Subjective:    Patient ID: Whitney Knight, female    DOB: 04-Jul-1950, 62 y.o.   MRN: [id number]  HPI 62 year old female with history of diabetes and recent episode of acute maxillary sinusitis presents for followup. Earlier this month, she was treated with prednisone and Levaquin for acute maxillary sinusitis. She had no improvement with these interventions. She continues to have purulent, dark green drainage from her nose. She reports sinus pressure and facial pain bilaterally. She denies any fever or chills. She does have occasional nonproductive cough. She has been using over-the-counter decongestants with no improvement.  Outpatient Encounter Prescriptions as of 04/21/2013  Medication Sig  . Alum & Mag Hydroxide-Simeth (MAGIC MOUTHWASH) SOLN Take 5 mLs by mouth 3 (three) times daily as needed for mouth pain.  [name] aspirin 81 MG tablet Take 81 mg by mouth daily.    Jennette Banker Sodium 30-100 MG CAPS Take 1 tablet by mouth daily.  . cholecalciferol (VITAMIN D) 1000 UNITS tablet Take 1,000 Units by mouth daily.  . fish oil-omega-3 fatty acids 1000 MG capsule Take 1 g by mouth daily.    . fluticasone (FLONASE) 50 MCG/ACT nasal spray Place 2 sprays into the nose daily.  [name] glimepiride (AMARYL) 4 MG tablet Take 1 tablet by mouth  daily  . levothyroxine (SYNTHROID, LEVOTHROID) 75 MCG tablet Take 1 tablet (75 mcg total) by mouth daily.  [name] losartan (COZAAR) 50 MG tablet TAKE 1 TABLET BY MOUTH ONCE A DAY  . metFORMIN (GLUCOPHAGE) 1000 MG tablet Take 1 tablet (1,000 mg total) by mouth 2 (two) times daily with a meal.  . omeprazole (PRILOSEC) 20 MG capsule Take 20 mg by mouth daily.  . ONE TOUCH ULTRA TEST test strip Use 1 to check blood sugar  once daily and as needed  . ONETOUCH DELICA LANCETS 33G MISC Use 1 daily as directed  . oxybutynin (DITROPAN-XL) 5 MG 24 hr tablet TAKE 1 TABLET BY MOUTH ONCE A DAY  . phentermine 15 MG capsule Take 15 mg by mouth daily.  . Pseudoephedrine-Naproxen  Na (ALEVE-D SINUS & COLD) 120-220 MG TB12 Take by mouth.   BP 150/96  Pulse 78  Temp(Src) 98.3 F (36.8 C) (Oral)  Wt 170 lb (77.111 kg)  SpO2 98%  Review of Systems  Constitutional: Positive for fatigue. Negative for fever, chills and unexpected weight change.  HENT: Positive for congestion, postnasal drip, rhinorrhea and sinus pressure. Negative for ear discharge, ear pain, facial swelling, hearing loss, mouth sores, nosebleeds, sneezing, sore throat, tinnitus, trouble swallowing and voice change.   Eyes: Negative for pain, discharge, redness and visual disturbance.  Respiratory: Positive for cough. Negative for chest tightness, shortness of breath, wheezing and stridor.   Cardiovascular: Negative for chest pain, palpitations and leg swelling.  Musculoskeletal: Negative for arthralgias, myalgias, neck pain and neck stiffness.  Skin: Negative for color change and rash.  Neurological: Negative for dizziness, weakness, light-headedness and headaches.  Hematological: Negative for adenopathy.       Objective:   Physical Exam  Constitutional: She is oriented to person, place, and time. She appears well-developed and well-nourished. No distress.  HENT:  Head: Normocephalic and atraumatic.  Right Ear: External ear normal.  Left Ear: External ear normal.  Nose: Mucosal edema present. Right sinus exhibits maxillary sinus tenderness. Left sinus exhibits maxillary sinus tenderness.  Mouth/Throat: Oropharynx is clear and moist. No oropharyngeal exudate.  Eyes: Conjunctivae are normal. Pupils are equal, round, and reactive to light. Right eye exhibits no discharge. Left  eye exhibits no discharge. No scleral icterus.  Neck: Normal range of motion. Neck supple. No tracheal deviation present. No thyromegaly present.  Cardiovascular: Normal rate, regular rhythm, normal heart sounds and intact distal pulses.  Exam reveals no gallop and no friction rub.   No murmur heard. Pulmonary/Chest: Effort  normal and breath sounds normal. No accessory muscle usage. Not tachypneic. No respiratory distress. She has no decreased breath sounds. She has no wheezes. She has no rhonchi. She has no rales. She exhibits no tenderness.  Musculoskeletal: Normal range of motion. She exhibits no edema and no tenderness.  Lymphadenopathy:    She has no cervical adenopathy.  Neurological: She is alert and oriented to person, place, and time. No cranial nerve deficit. She exhibits normal muscle tone. Coordination normal.  Skin: Skin is warm and dry. No rash noted. She is not diaphoretic. No erythema. No pallor.  Psychiatric: She has a normal mood and affect. Her behavior is normal. Judgment and thought content normal.          Assessment & Plan:

## 2013-04-22 ENCOUNTER — Ambulatory Visit: Payer: 59 | Admitting: Internal Medicine

## 2013-05-02 ENCOUNTER — Encounter: Payer: Self-pay | Admitting: Internal Medicine

## 2013-05-05 ENCOUNTER — Ambulatory Visit: Payer: 59 | Admitting: Internal Medicine

## 2013-05-21 LAB — HM DIABETES EYE EXAM

## 2013-06-02 ENCOUNTER — Other Ambulatory Visit: Payer: Self-pay | Admitting: Internal Medicine

## 2013-06-04 ENCOUNTER — Other Ambulatory Visit: Payer: Self-pay | Admitting: Internal Medicine

## 2013-06-04 MED ORDER — GLIMEPIRIDE 4 MG PO TABS: ORAL_TABLET | ORAL | Status: DC

## 2013-07-08 ENCOUNTER — Other Ambulatory Visit: Payer: Self-pay | Admitting: Internal Medicine

## 2013-08-11 ENCOUNTER — Encounter: Payer: Self-pay | Admitting: Internal Medicine

## 2013-08-18 ENCOUNTER — Other Ambulatory Visit: Payer: Self-pay | Admitting: Internal Medicine

## 2013-08-19 ENCOUNTER — Encounter: Payer: Self-pay | Admitting: Internal Medicine

## 2013-08-19 ENCOUNTER — Ambulatory Visit (INDEPENDENT_AMBULATORY_CARE_PROVIDER_SITE_OTHER): Payer: 59 | Admitting: Internal Medicine

## 2013-08-19 VITALS — BP 140/80 | HR 79 | Temp 97.7°F | Ht 63.5 in | Wt 173.8 lb

## 2013-08-19 DIAGNOSIS — IMO0002 Reserved for concepts with insufficient information to code with codable children: Secondary | ICD-10-CM

## 2013-08-19 DIAGNOSIS — R300 Dysuria: Secondary | ICD-10-CM

## 2013-08-19 DIAGNOSIS — N390 Urinary tract infection, site not specified: Secondary | ICD-10-CM | POA: Insufficient documentation

## 2013-08-19 DIAGNOSIS — E669 Obesity, unspecified: Secondary | ICD-10-CM

## 2013-08-19 DIAGNOSIS — I10 Essential (primary) hypertension: Secondary | ICD-10-CM

## 2013-08-19 DIAGNOSIS — IMO0001 Reserved for inherently not codable concepts without codable children: Secondary | ICD-10-CM

## 2013-08-19 DIAGNOSIS — E1165 Type 2 diabetes mellitus with hyperglycemia: Secondary | ICD-10-CM

## 2013-08-19 LAB — POCT URINALYSIS DIPSTICK
Bilirubin, UA: NEGATIVE
GLUCOSE UA: NEGATIVE
Ketones, UA: NEGATIVE
Leukocytes, UA: NEGATIVE
Nitrite, UA: NEGATIVE
RBC UA: NEGATIVE
SPEC GRAV UA: 1.02
Urobilinogen, UA: 0.2
pH, UA: 6

## 2013-08-19 LAB — HM DIABETES FOOT EXAM: HM DIABETIC FOOT EXAM: NORMAL

## 2013-08-19 MED ORDER — PHENTERMINE HCL 15 MG PO CAPS: 15.0000 mg | ORAL_CAPSULE | Freq: Every day | ORAL | Status: DC

## 2013-08-19 MED ORDER — ZOSTER VACCINE LIVE 19400 UNT/0.65ML ~~LOC~~ SOLR: 0.6500 mL | Freq: Once | SUBCUTANEOUS | Status: DC

## 2013-08-19 MED ORDER — CIPROFLOXACIN HCL 500 MG PO TABS: 500.0000 mg | ORAL_TABLET | Freq: Two times a day (BID) | ORAL | Status: DC

## 2013-08-19 NOTE — Assessment & Plan Note (Signed)
BP Readings from Last 3 Encounters:  08/19/13 140/80  04/21/13 150/96  04/11/13 146/72   BP has generally been well controlled. Will monitor closely while on phentermine. Continue Losartan.

## 2013-08-19 NOTE — Progress Notes (Signed)
Subjective:    Patient ID: Whitney Knight, female    DOB: 13-May-1950, 63 y.o.   MRN: [id number]  HPI 62YO female presents for follow up.  DM - 125 this morning. Compliant with medications.  Dysuria and low back pain noted yesterday and today. No fever. No flank pain. No gross hematuria.  Review of Systems  Constitutional: Negative for fever, chills, appetite change, fatigue and unexpected weight change.  HENT: Negative for congestion, ear pain, sinus pressure, sore throat, trouble swallowing and voice change.   Eyes: Negative for visual disturbance.  Respiratory: Negative for cough, shortness of breath, wheezing and stridor.   Cardiovascular: Negative for chest pain, palpitations and leg swelling.  Gastrointestinal: Negative for nausea, vomiting, abdominal pain, diarrhea, constipation, blood in stool, abdominal distention and anal bleeding.  Genitourinary: Positive for dysuria, urgency and frequency. Negative for flank pain.  Musculoskeletal: Negative for arthralgias, gait problem, myalgias and neck pain.  Skin: Negative for color change and rash.  Neurological: Negative for dizziness and headaches.  Hematological: Negative for adenopathy. Does not bruise/bleed easily.  Psychiatric/Behavioral: Negative for suicidal ideas, sleep disturbance and dysphoric mood. The patient is not nervous/anxious.        Objective:    BP 140/80  Pulse 79  Temp(Src) 97.7 F (36.5 C) (Oral)  Ht 5' 3.5" (1.613 m)  Wt 173 lb 12 oz (78.812 kg)  BMI 30.29 kg/m2  SpO2 99% Physical Exam  Constitutional: She is oriented to person, place, and time. She appears well-developed and well-nourished. No distress.  HENT:  Head: Normocephalic and atraumatic.  Right Ear: External ear normal.  Left Ear: External ear normal.  Nose: Nose normal.  Mouth/Throat: Oropharynx is clear and moist. No oropharyngeal exudate.  Eyes: Conjunctivae are normal. Pupils are equal, round, and reactive to light. Right eye  exhibits no discharge. Left eye exhibits no discharge. No scleral icterus.  Neck: Normal range of motion. Neck supple. No tracheal deviation present. No thyromegaly present.  Cardiovascular: Normal rate, regular rhythm, normal heart sounds and intact distal pulses.  Exam reveals no gallop and no friction rub.   No murmur heard. Pulmonary/Chest: Effort normal and breath sounds normal. No accessory muscle usage. Not tachypneic. No respiratory distress. She has no decreased breath sounds. She has no wheezes. She has no rhonchi. She has no rales. She exhibits no tenderness.  Musculoskeletal: Normal range of motion. She exhibits no edema and no tenderness.  Lymphadenopathy:    She has no cervical adenopathy.  Neurological: She is alert and oriented to person, place, and time. No cranial nerve deficit. She exhibits normal muscle tone. Coordination normal.  Skin: Skin is warm and dry. No rash noted. She is not diaphoretic. No erythema. No pallor.  Psychiatric: She has a normal mood and affect. Her behavior is normal. Judgment and thought content normal.          Assessment & Plan:   Problem List Items Addressed This Visit   Diabetes mellitus type 2, uncontrolled - Primary      Lab Results  Component Value Date   HGBA1C 7.9* 01/23/2013   Will check A1c with labs today. Foot exam normal today. Continue Metformin and Glimepiride.    Hypertension      BP Readings from Last 3 Encounters:  08/19/13 140/80  04/21/13 150/96  04/11/13 146/72   BP has generally been well controlled. Will monitor closely while on phentermine. Continue Losartan.    Obesity      Wt Readings from Last 3  Encounters:  08/19/13 173 lb 12 oz (78.812 kg)  04/21/13 170 lb (77.111 kg)  04/11/13 170 lb 8 oz (77.338 kg)   Encouraged healthy diet and exercise. Will restart phentermine to help with appetite suppression. She will monitor BP closely on this medication. Follow up 4 weeks.    Relevant Medications       phentermine capsule   UTI (urinary tract infection)     Symptoms consistent with UTI. Will send urine for culture. Start cipro bid x 7 days. Follow up if symptoms are not improving.    Relevant Medications      ciprofloxacin (CIPRO) tablet      ZOSTAVAX 27253 UNT/0.65ML [place] SOLR   Other Relevant Orders      CULTURE, URINE COMPREHENSIVE    Other Visit Diagnoses   Dysuria        Relevant Orders       POCT Urinalysis Dipstick        Return in about 4 weeks (around 09/16/2013) for Recheck.

## 2013-08-19 NOTE — Assessment & Plan Note (Signed)
Symptoms consistent with UTI. Will send urine for culture. Start cipro bid x 7 days. Follow up if symptoms are not improving.

## 2013-08-19 NOTE — Progress Notes (Signed)
Pre visit review using our clinic review tool, if applicable. No additional management support is needed unless otherwise documented below in the visit note. 

## 2013-08-19 NOTE — Assessment & Plan Note (Signed)
Lab Results  Component Value Date   HGBA1C 7.9* 01/23/2013   Will check A1c with labs today. Foot exam normal today. Continue Metformin and Glimepiride.

## 2013-08-19 NOTE — Assessment & Plan Note (Signed)
Wt Readings from Last 3 Encounters:  08/19/13 173 lb 12 oz (78.812 kg)  04/21/13 170 lb (77.111 kg)  04/11/13 170 lb 8 oz (77.338 kg)   Encouraged healthy diet and exercise. Will restart phentermine to help with appetite suppression. She will monitor BP closely on this medication. Follow up 4 weeks.

## 2013-08-20 ENCOUNTER — Telehealth: Payer: Self-pay | Admitting: Internal Medicine

## 2013-08-20 NOTE — Telephone Encounter (Signed)
Relevant patient education assigned to patient using Emmi. ° °

## 2013-08-21 LAB — CULTURE, URINE COMPREHENSIVE
COLONY COUNT: NO GROWTH
ORGANISM ID, BACTERIA: NO GROWTH

## 2013-08-29 ENCOUNTER — Telehealth: Payer: Self-pay

## 2013-08-29 NOTE — Telephone Encounter (Signed)
Relevant patient education assigned to patient using Emmi. ° °

## 2013-09-16 ENCOUNTER — Ambulatory Visit: Payer: 59 | Admitting: Internal Medicine

## 2013-10-11 ENCOUNTER — Other Ambulatory Visit: Payer: Self-pay | Admitting: Internal Medicine

## 2013-10-29 ENCOUNTER — Ambulatory Visit (INDEPENDENT_AMBULATORY_CARE_PROVIDER_SITE_OTHER): Payer: 59 | Admitting: Internal Medicine

## 2013-10-29 ENCOUNTER — Encounter: Payer: Self-pay | Admitting: Internal Medicine

## 2013-10-29 VITALS — BP 150/82 | HR 76 | Temp 97.8°F | Ht 63.5 in | Wt 172.5 lb

## 2013-10-29 DIAGNOSIS — J208 Acute bronchitis due to other specified organisms: Secondary | ICD-10-CM

## 2013-10-29 DIAGNOSIS — J209 Acute bronchitis, unspecified: Secondary | ICD-10-CM

## 2013-10-29 MED ORDER — HYDROCOD POLST-CHLORPHEN POLST 10-8 MG/5ML PO LQCR: 5.0000 mL | Freq: Two times a day (BID) | ORAL | Status: DC | PRN

## 2013-10-29 MED ORDER — LEVOFLOXACIN 500 MG PO TABS: 500.0000 mg | ORAL_TABLET | Freq: Every day | ORAL | Status: DC

## 2013-10-29 NOTE — Progress Notes (Signed)
   Subjective:    Patient ID: Whitney Knight, female    DOB: 1950/09/11, 63 y.o.   MRN: [id number]  HPI 63YO female presents for acute visit.  June 20th, fractured a tooth while eating a pistachio. Had some sinus congestion since that time. Was on Clindamycin after tooth fracture but stopped early because of nausea and thrush.  Last night, developed cough productive of purulent sputum. Feels short of breath. No fever.  Review of Systems  Constitutional: Positive for fatigue. Negative for fever, chills and unexpected weight change.  HENT: Positive for congestion, postnasal drip, rhinorrhea and sore throat. Negative for ear discharge, ear pain, facial swelling, hearing loss, mouth sores, nosebleeds, sinus pressure, sneezing, tinnitus, trouble swallowing and voice change.   Eyes: Negative for pain, discharge, redness and visual disturbance.  Respiratory: Positive for cough and shortness of breath. Negative for chest tightness, wheezing and stridor.   Cardiovascular: Negative for chest pain, palpitations and leg swelling.  Musculoskeletal: Negative for arthralgias, myalgias, neck pain and neck stiffness.  Skin: Negative for color change and rash.  Neurological: Negative for dizziness, weakness, light-headedness and headaches.  Hematological: Negative for adenopathy.       Objective:    BP 150/82  Pulse 76  Temp(Src) 97.8 F (36.6 C) (Oral)  Ht 5' 3.5" (1.613 m)  Wt 172 lb 8 oz (78.245 kg)  BMI 30.07 kg/m2  SpO2 97% Physical Exam  Constitutional: She is oriented to person, place, and time. She appears well-developed and well-nourished. No distress.  HENT:  Head: Normocephalic and atraumatic.  Right Ear: External ear normal.  Left Ear: External ear normal.  Nose: Nose normal.  Mouth/Throat: Oropharynx is clear and moist. No oropharyngeal exudate.  Eyes: Conjunctivae are normal. Pupils are equal, round, and reactive to light. Right eye exhibits no discharge. Left eye exhibits no  discharge. No scleral icterus.  Neck: Normal range of motion. Neck supple. No tracheal deviation present. No thyromegaly present.  Cardiovascular: Normal rate, regular rhythm, normal heart sounds and intact distal pulses.  Exam reveals no gallop and no friction rub.   No murmur heard. Pulmonary/Chest: Effort normal. No accessory muscle usage. Not tachypneic. No respiratory distress. She has no decreased breath sounds. She has no wheezes. She has rhonchi in the left lower field. She has no rales. She exhibits no tenderness.  Musculoskeletal: Normal range of motion. She exhibits no edema and no tenderness.  Lymphadenopathy:    She has no cervical adenopathy.  Neurological: She is alert and oriented to person, place, and time. No cranial nerve deficit. She exhibits normal muscle tone. Coordination normal.  Skin: Skin is warm and dry. No rash noted. She is not diaphoretic. No erythema. No pallor.  Psychiatric: She has a normal mood and affect. Her behavior is normal. Judgment and thought content normal.          Assessment & Plan:   Problem List Items Addressed This Visit   None    Visit Diagnoses   Acute bronchitis due to other specified organisms    -  Primary    Relevant Medications       chlorpheniramine-HYDROcodone (TUSSIONEX) suspension 8-10 mg/96mL       levofloxacin (LEVAQUIN) tablet        Return in about 4 weeks (around 11/26/2013) for Recheck of Diabetes.

## 2013-10-29 NOTE — Progress Notes (Signed)
Pre visit review using our clinic review tool, if applicable. No additional management support is needed unless otherwise documented below in the visit note. 

## 2013-10-29 NOTE — Patient Instructions (Signed)
Start Levaquin 500mg  daily.  Use Tussionex as needed for cough.  Follow up 4 weeks or sooner as needed.

## 2013-11-06 ENCOUNTER — Encounter: Payer: Self-pay | Admitting: Internal Medicine

## 2013-11-12 LAB — BASIC METABOLIC PANEL
BUN: 11 mg/dL (ref 4–21)
CREATININE: 0.9 mg/dL (ref 0.5–1.1)
GLUCOSE: 239 mg/dL
POTASSIUM: 4.1 mmol/L (ref 3.4–5.3)
SODIUM: 140 mmol/L (ref 137–147)

## 2013-11-18 ENCOUNTER — Telehealth: Payer: Self-pay | Admitting: Internal Medicine

## 2013-11-24 ENCOUNTER — Other Ambulatory Visit: Payer: Self-pay | Admitting: *Deleted

## 2013-12-12 ENCOUNTER — Encounter: Payer: Self-pay | Admitting: Internal Medicine

## 2013-12-12 ENCOUNTER — Ambulatory Visit (INDEPENDENT_AMBULATORY_CARE_PROVIDER_SITE_OTHER): Payer: 59 | Admitting: Internal Medicine

## 2013-12-12 VITALS — BP 146/80 | HR 81 | Temp 98.2°F | Ht 63.5 in | Wt 176.5 lb

## 2013-12-12 DIAGNOSIS — IMO0001 Reserved for inherently not codable concepts without codable children: Secondary | ICD-10-CM

## 2013-12-12 DIAGNOSIS — E669 Obesity, unspecified: Secondary | ICD-10-CM

## 2013-12-12 DIAGNOSIS — E1165 Type 2 diabetes mellitus with hyperglycemia: Secondary | ICD-10-CM

## 2013-12-12 DIAGNOSIS — IMO0002 Reserved for concepts with insufficient information to code with codable children: Secondary | ICD-10-CM

## 2013-12-12 DIAGNOSIS — I10 Essential (primary) hypertension: Secondary | ICD-10-CM

## 2013-12-12 MED ORDER — LOSARTAN POTASSIUM-HCTZ 100-12.5 MG PO TABS: 1.0000 | ORAL_TABLET | Freq: Every day | ORAL | Status: DC

## 2013-12-12 NOTE — Assessment & Plan Note (Signed)
Will check A1c with labs through her work. Continue Metformin and prn Glimepiride. Follow up in 4 weeks and prn.

## 2013-12-12 NOTE — Patient Instructions (Addendum)
Start Losartan-HCTZ 100-12.5mg  daily.  Labs through [facility].  Follow up 4 weeks.

## 2013-12-12 NOTE — Assessment & Plan Note (Signed)
Wt Readings from Last 3 Encounters:  12/12/13 176 lb 8 oz (80.06 kg)  10/29/13 172 lb 8 oz (78.245 kg)  08/19/13 173 lb 12 oz (78.812 kg)   Body mass index is 30.77 kg/(m^2). Encouraged continued efforts at healthy diet and exercise.

## 2013-12-12 NOTE — Progress Notes (Signed)
Pre visit review using our clinic review tool, if applicable. No additional management support is needed unless otherwise documented below in the visit note. 

## 2013-12-12 NOTE — Progress Notes (Signed)
Subjective:    Patient ID: Whitney Knight, female    DOB: [birth year], 63 y.o.   MRN: [id number]  HPI 63YO female presents for follow up.  DM - BG mostly near 120. Compliant with metformin. Recently used Glimepiride when had a broken tooth and was eating milk shakes.  HTN - BP running near 130-140s/90s. Compliant with Losartan  Obesity - Continues to work on healthy diet and regular exercise.  Review of Systems  Constitutional: Negative for fever, chills, appetite change, fatigue and unexpected weight change.  Eyes: Negative for visual disturbance.  Respiratory: Negative for shortness of breath.   Cardiovascular: Negative for chest pain and leg swelling.  Gastrointestinal: Negative for abdominal pain, diarrhea and constipation.  Skin: Negative for color change and rash.  Hematological: Negative for adenopathy. Does not bruise/bleed easily.  Psychiatric/Behavioral: Negative for dysphoric mood. The patient is not nervous/anxious.        Objective:    BP 146/80  Pulse 81  Temp(Src) 98.2 F (36.8 C) (Oral)  Ht 5' 3.5" (1.613 m)  Wt 176 lb 8 oz (80.06 kg)  BMI 30.77 kg/m2  SpO2 98% Physical Exam  Constitutional: She is oriented to person, place, and time. She appears well-developed and well-nourished. No distress.  HENT:  Head: Normocephalic and atraumatic.  Right Ear: External ear normal.  Left Ear: External ear normal.  Nose: Nose normal.  Mouth/Throat: Oropharynx is clear and moist. No oropharyngeal exudate.  Eyes: Conjunctivae are normal. Pupils are equal, round, and reactive to light. Right eye exhibits no discharge. Left eye exhibits no discharge. No scleral icterus.  Neck: Normal range of motion. Neck supple. No tracheal deviation present. No thyromegaly present.  Cardiovascular: Normal rate, regular rhythm, normal heart sounds and intact distal pulses.  Exam reveals no gallop and no friction rub.   No murmur heard. Pulmonary/Chest: Effort normal and breath sounds  normal. No accessory muscle usage. Not tachypneic. No respiratory distress. She has no decreased breath sounds. She has no wheezes. She has no rhonchi. She has no rales. She exhibits no tenderness.  Musculoskeletal: Normal range of motion. She exhibits no edema and no tenderness.  Lymphadenopathy:    She has no cervical adenopathy.  Neurological: She is alert and oriented to person, place, and time. No cranial nerve deficit. She exhibits normal muscle tone. Coordination normal.  Skin: Skin is warm and dry. No rash noted. She is not diaphoretic. No erythema. No pallor.  Psychiatric: She has a normal mood and affect. Her behavior is normal. Judgment and thought content normal.          Assessment & Plan:   Problem List Items Addressed This Visit     Unprioritized   Diabetes mellitus type 2, uncontrolled - Primary     Will check A1c with labs through her work. Continue Metformin and prn Glimepiride. Follow up in 4 weeks and prn.    Relevant Medications      LOSARTAN POTASSIUM-HCTZ 100-12.5 MG PO TABS   Hypertension       BP Readings from Last 3 Encounters:  12/12/13 146/80  10/29/13 150/82  08/19/13 140/80   BP elevated. Will add HCTZ to Losartan. Check renal function with labs next week. Follow up in 4 weeks and email BP readings in the interim.    Relevant Medications      LOSARTAN POTASSIUM-HCTZ 100-12.5 MG PO TABS   Obesity      Wt Readings from Last 3 Encounters:  12/12/13 176 lb 8 oz (80.06  kg)  10/29/13 172 lb 8 oz (78.245 kg)  08/19/13 173 lb 12 oz (78.812 kg)   Body mass index is 30.77 kg/(m^2). Encouraged continued efforts at healthy diet and exercise.        Return in about 4 weeks (around 01/09/2014) for Recheck of Blood Pressure.

## 2013-12-12 NOTE — Assessment & Plan Note (Signed)
  BP Readings from Last 3 Encounters:  12/12/13 146/80  10/29/13 150/82  08/19/13 140/80   BP elevated. Will add HCTZ to Losartan. Check renal function with labs next week. Follow up in 4 weeks and email BP readings in the interim.

## 2013-12-16 LAB — LIPID PANEL
CHOLESTEROL: 177 mg/dL (ref 0–200)
HDL: 60 mg/dL (ref 35–70)
LDL CALC: 88 mg/dL
Triglycerides: 143 mg/dL (ref 40–160)

## 2013-12-16 LAB — HEMOGLOBIN A1C: HEMOGLOBIN A1C: 9 % — AB (ref 4.0–6.0)

## 2014-01-15 ENCOUNTER — Ambulatory Visit (INDEPENDENT_AMBULATORY_CARE_PROVIDER_SITE_OTHER): Payer: 59 | Admitting: Internal Medicine

## 2014-01-15 ENCOUNTER — Encounter: Payer: Self-pay | Admitting: Internal Medicine

## 2014-01-15 VITALS — BP 128/72 | HR 74 | Temp 97.6°F | Ht 63.5 in | Wt 174.5 lb

## 2014-01-15 DIAGNOSIS — IMO0002 Reserved for concepts with insufficient information to code with codable children: Secondary | ICD-10-CM

## 2014-01-15 DIAGNOSIS — IMO0001 Reserved for inherently not codable concepts without codable children: Secondary | ICD-10-CM

## 2014-01-15 DIAGNOSIS — E669 Obesity, unspecified: Secondary | ICD-10-CM

## 2014-01-15 DIAGNOSIS — E1165 Type 2 diabetes mellitus with hyperglycemia: Secondary | ICD-10-CM

## 2014-01-15 DIAGNOSIS — I10 Essential (primary) hypertension: Secondary | ICD-10-CM

## 2014-01-15 MED ORDER — LOSARTAN POTASSIUM-HCTZ 100-12.5 MG PO TABS: 1.0000 | ORAL_TABLET | Freq: Every day | ORAL | Status: DC

## 2014-01-15 NOTE — Patient Instructions (Signed)
Start taking Glimepiride regularly in the morning.  Monitor blood sugars 1-2 times daily.  Follow up in 4 weeks.

## 2014-01-15 NOTE — Assessment & Plan Note (Signed)
Wt Readings from Last 3 Encounters:  01/15/14 174 lb 8 oz (79.153 kg)  12/12/13 176 lb 8 oz (80.06 kg)  10/29/13 172 lb 8 oz (78.245 kg)   Body mass index is 30.42 kg/(m^2). Encouraged healthy diet, avoidance of processed carbohydrates, and regular exercise. Goal 10lb weight loss in 3 months.

## 2014-01-15 NOTE — Assessment & Plan Note (Signed)
Lab Results  Component Value Date   HGBA1C 7.9* 01/23/2013   Pt reports recent A1c at work was 9%. Encouraged better compliance with diet and regular exercise with goal of 20-7min per week.

## 2014-01-15 NOTE — Progress Notes (Signed)
Subjective:    Patient ID: Whitney Knight, female    DOB: Jan 01, 1951, 63 y.o.   MRN: [id number]  HPI 63YO female presents for follow up.  Recent A1c was 9%. Notes some dietary indiscretion, esp at work, with increased candy intake. Not exercising.  HTN - BP has been well controlled 108/80 at home. Compliant with HCTZ and losartan.   Review of Systems  Constitutional: Negative for fever, chills, appetite change, fatigue and unexpected weight change.  Eyes: Negative for visual disturbance.  Respiratory: Negative for shortness of breath.   Cardiovascular: Negative for chest pain and leg swelling.  Gastrointestinal: Negative for nausea, vomiting, abdominal pain, diarrhea and constipation.  Musculoskeletal: Negative for arthralgias and myalgias.  Skin: Negative for color change and rash.  Hematological: Negative for adenopathy. Does not bruise/bleed easily.  Psychiatric/Behavioral: Negative for dysphoric mood. The patient is not nervous/anxious.        Objective:    BP 128/72  Pulse 74  Temp(Src) 97.6 F (36.4 C) (Oral)  Ht 5' 3.5" (1.613 m)  Wt 174 lb 8 oz (79.153 kg)  BMI 30.42 kg/m2  SpO2 98% Physical Exam  Constitutional: She is oriented to person, place, and time. She appears well-developed and well-nourished. No distress.  HENT:  Head: Normocephalic and atraumatic.  Right Ear: External ear normal.  Left Ear: External ear normal.  Nose: Nose normal.  Mouth/Throat: Oropharynx is clear and moist. No oropharyngeal exudate.  Eyes: Conjunctivae are normal. Pupils are equal, round, and reactive to light. Right eye exhibits no discharge. Left eye exhibits no discharge. No scleral icterus.  Neck: Normal range of motion. Neck supple. No tracheal deviation present. No thyromegaly present.  Cardiovascular: Normal rate, regular rhythm, normal heart sounds and intact distal pulses.  Exam reveals no gallop and no friction rub.   No murmur heard. Pulmonary/Chest: Effort normal and  breath sounds normal. No accessory muscle usage. Not tachypneic. No respiratory distress. She has no decreased breath sounds. She has no wheezes. She has no rhonchi. She has no rales. She exhibits no tenderness.  Musculoskeletal: Normal range of motion. She exhibits no edema and no tenderness.  Lymphadenopathy:    She has no cervical adenopathy.  Neurological: She is alert and oriented to person, place, and time. No cranial nerve deficit. She exhibits normal muscle tone. Coordination normal.  Skin: Skin is warm and dry. No rash noted. She is not diaphoretic. No erythema. No pallor.  Psychiatric: She has a normal mood and affect. Her behavior is normal. Judgment and thought content normal.          Assessment & Plan:   Problem List Items Addressed This Visit     Unprioritized   Diabetes mellitus type 2, uncontrolled - Primary      Lab Results  Component Value Date   HGBA1C 7.9* 01/23/2013   Pt reports recent A1c at work was 9%. Encouraged better compliance with diet and regular exercise with goal of 20-42min per week.    Relevant Medications      losartan-hydrochlorothiazide (HYZAAR) 100-12.5 MG per tablet   Hypertension      BP Readings from Last 3 Encounters:  01/15/14 128/72  12/12/13 146/80  10/29/13 150/82   BP improved with HCTZ. Will continue. Will request recent labs including renal function from [facility].    Relevant Medications      losartan-hydrochlorothiazide (HYZAAR) 100-12.5 MG per tablet   Obesity      Wt Readings from Last 3 Encounters:  01/15/14 174 lb  8 oz (79.153 kg)  12/12/13 176 lb 8 oz (80.06 kg)  10/29/13 172 lb 8 oz (78.245 kg)   Body mass index is 30.42 kg/(m^2). Encouraged healthy diet, avoidance of processed carbohydrates, and regular exercise. Goal 10lb weight loss in 3 months.        Return in about 4 weeks (around 02/12/2014) for Recheck.

## 2014-01-15 NOTE — Assessment & Plan Note (Signed)
BP Readings from Last 3 Encounters:  01/15/14 128/72  12/12/13 146/80  10/29/13 150/82   BP improved with HCTZ. Will continue. Will request recent labs including renal function from [place].

## 2014-01-15 NOTE — Progress Notes (Signed)
Pre visit review using our clinic review tool, if applicable. No additional management support is needed unless otherwise documented below in the visit note. 

## 2014-01-21 ENCOUNTER — Encounter: Payer: Self-pay | Admitting: *Deleted

## 2014-01-21 ENCOUNTER — Other Ambulatory Visit: Payer: Self-pay | Admitting: Internal Medicine

## 2014-02-17 ENCOUNTER — Telehealth: Payer: Self-pay | Admitting: Internal Medicine

## 2014-02-17 NOTE — Telephone Encounter (Signed)
Sent mychart message

## 2014-02-17 NOTE — Telephone Encounter (Signed)
Recent labs showed normal kidney and liver function, normal thyroid function.

## 2014-02-19 ENCOUNTER — Encounter: Payer: Self-pay | Admitting: Internal Medicine

## 2014-02-19 ENCOUNTER — Ambulatory Visit (INDEPENDENT_AMBULATORY_CARE_PROVIDER_SITE_OTHER): Payer: 59 | Admitting: Internal Medicine

## 2014-02-19 VITALS — BP 128/80 | HR 79 | Temp 97.9°F | Ht 63.5 in | Wt 178.5 lb

## 2014-02-19 DIAGNOSIS — E1165 Type 2 diabetes mellitus with hyperglycemia: Secondary | ICD-10-CM

## 2014-02-19 DIAGNOSIS — E039 Hypothyroidism, unspecified: Secondary | ICD-10-CM

## 2014-02-19 DIAGNOSIS — I10 Essential (primary) hypertension: Secondary | ICD-10-CM

## 2014-02-19 DIAGNOSIS — IMO0002 Reserved for concepts with insufficient information to code with codable children: Secondary | ICD-10-CM

## 2014-02-19 DIAGNOSIS — E669 Obesity, unspecified: Secondary | ICD-10-CM

## 2014-02-19 MED ORDER — PHENTERMINE HCL 15 MG PO CAPS: 15.0000 mg | ORAL_CAPSULE | ORAL | Status: DC

## 2014-02-19 NOTE — Progress Notes (Signed)
Subjective:    Patient ID: Whitney Knight, female    DOB: 1950-05-21, 63 y.o.   MRN: [id number]  HPI 63YO female presents for follow up.  DM - BG have been better controlled. Taking half glimepiride 1/2 at lunch and 1/2 at dinner. BG near mostly low 100s. Non-fasting near 130-150. No sugars below 70, except when takes full glimepiride all at once.  Obesity - Wt Readings from Last 3 Encounters:  02/19/14 178 lb 8 oz (80.967 kg)  01/15/14 174 lb 8 oz (79.153 kg)  12/12/13 176 lb 8 oz (80.06 kg)  Weight up slightly. Notes increased appetite at night and dietary indiscretion.  Plans to get flu vaccine at work.  Review of Systems  Constitutional: Negative for fever, chills, appetite change, fatigue and unexpected weight change.  Eyes: Negative for visual disturbance.  Respiratory: Negative for shortness of breath.   Cardiovascular: Negative for chest pain and leg swelling.  Gastrointestinal: Negative for nausea, vomiting, abdominal pain, diarrhea and constipation.  Skin: Negative for color change and rash.  Hematological: Negative for adenopathy. Does not bruise/bleed easily.  Psychiatric/Behavioral: Negative for dysphoric mood. The patient is not nervous/anxious.        Objective:    BP 128/80  Pulse 79  Temp(Src) 97.9 F (36.6 C) (Oral)  Ht 5' 3.5" (1.613 m)  Wt 178 lb 8 oz (80.967 kg)  BMI 31.12 kg/m2  SpO2 98% Physical Exam  Constitutional: She is oriented to person, place, and time. She appears well-developed and well-nourished. No distress.  HENT:  Head: Normocephalic and atraumatic.  Right Ear: External ear normal.  Left Ear: External ear normal.  Nose: Nose normal.  Mouth/Throat: Oropharynx is clear and moist. No oropharyngeal exudate.  Eyes: Conjunctivae are normal. Pupils are equal, round, and reactive to light. Right eye exhibits no discharge. Left eye exhibits no discharge. No scleral icterus.  Neck: Normal range of motion. Neck supple. No tracheal  deviation present. No thyromegaly present.  Cardiovascular: Normal rate, regular rhythm, normal heart sounds and intact distal pulses.  Exam reveals no gallop and no friction rub.   No murmur heard. Pulmonary/Chest: Effort normal and breath sounds normal. No accessory muscle usage. Not tachypneic. No respiratory distress. She has no decreased breath sounds. She has no wheezes. She has no rhonchi. She has no rales. She exhibits no tenderness.  Musculoskeletal: Normal range of motion. She exhibits no edema and no tenderness.  Lymphadenopathy:    She has no cervical adenopathy.  Neurological: She is alert and oriented to person, place, and time. No cranial nerve deficit. She exhibits normal muscle tone. Coordination normal.  Skin: Skin is warm and dry. No rash noted. She is not diaphoretic. No erythema. No pallor.  Psychiatric: She has a normal mood and affect. Her behavior is normal. Judgment and thought content normal.          Assessment & Plan:   Problem List Items Addressed This Visit     Unprioritized   Diabetes mellitus type 2, uncontrolled - Primary     BG better controlled. Will recheck A1c in 03/2014.    Hypertension      BP Readings from Last 3 Encounters:  02/19/14 128/80  01/15/14 128/72  12/12/13 146/80   BP well controlled on current medications. Will continue.    Hypothyroidism     Recent thyroid function normal. Continue Levothyroxine.    Obesity      Wt Readings from Last 3 Encounters:  02/19/14 178 lb 8 oz (  80.967 kg)  01/15/14 174 lb 8 oz (79.153 kg)  12/12/13 176 lb 8 oz (80.06 kg)   Encouraged healthy diet and exercise. Will add back Phentermine 15mg  daily to help control afternoon cravings. Follow up 4 weeks.    Relevant Medications      phentermine capsule       Return in about 4 weeks (around 03/19/2014).

## 2014-02-19 NOTE — Assessment & Plan Note (Signed)
Recent thyroid function normal. Continue Levothyroxine.

## 2014-02-19 NOTE — Patient Instructions (Addendum)
Plan repeat A1c mid November.  Start Phentermine to help control appetite at night.  Follow up in mid November.

## 2014-02-19 NOTE — Assessment & Plan Note (Signed)
Wt Readings from Last 3 Encounters:  02/19/14 178 lb 8 oz (80.967 kg)  01/15/14 174 lb 8 oz (79.153 kg)  12/12/13 176 lb 8 oz (80.06 kg)   Encouraged healthy diet and exercise. Will add back Phentermine 15mg  daily to help control afternoon cravings. Follow up 4 weeks.

## 2014-02-19 NOTE — Progress Notes (Signed)
Pre visit review using our clinic review tool, if applicable. No additional management support is needed unless otherwise documented below in the visit note. 

## 2014-02-19 NOTE — Assessment & Plan Note (Signed)
BP Readings from Last 3 Encounters:  02/19/14 128/80  01/15/14 128/72  12/12/13 146/80   BP well controlled on current medications. Will continue.

## 2014-02-19 NOTE — Assessment & Plan Note (Signed)
BG better controlled. Will recheck A1c in 03/2014.

## 2014-02-24 ENCOUNTER — Other Ambulatory Visit: Payer: Self-pay | Admitting: Internal Medicine

## 2014-02-25 NOTE — Telephone Encounter (Signed)
Refill

## 2014-03-20 ENCOUNTER — Ambulatory Visit (INDEPENDENT_AMBULATORY_CARE_PROVIDER_SITE_OTHER): Payer: 59 | Admitting: Internal Medicine

## 2014-03-20 ENCOUNTER — Encounter: Payer: Self-pay | Admitting: Internal Medicine

## 2014-03-20 VITALS — BP 98/60 | HR 78 | Temp 98.3°F | Ht 63.5 in | Wt 174.8 lb

## 2014-03-20 DIAGNOSIS — J209 Acute bronchitis, unspecified: Secondary | ICD-10-CM | POA: Insufficient documentation

## 2014-03-20 MED ORDER — DOXYCYCLINE HYCLATE 100 MG PO TABS
100.0000 mg | ORAL_TABLET | Freq: Two times a day (BID) | ORAL | Status: DC
Start: 2014-03-20 — End: 2014-04-10

## 2014-03-20 MED ORDER — HYDROCOD POLST-CHLORPHEN POLST 10-8 MG/5ML PO LQCR: 5.0000 mL | Freq: Two times a day (BID) | ORAL | Status: DC | PRN

## 2014-03-20 MED ORDER — PREDNISONE 10 MG PO TABS: ORAL_TABLET | ORAL | Status: DC

## 2014-03-20 NOTE — Assessment & Plan Note (Signed)
Symptoms and exam consistent with early bronchitis. Will start Prednisone taper and Doxycycline. Continue prn Albuterol. Will add Tussionex as needed for cough. Follow up early next week if symptoms are not improving.

## 2014-03-20 NOTE — Progress Notes (Signed)
   Subjective:    Patient ID: Whitney Knight, female    DOB: [birth year], 63 y.o.   MRN: [id number]  HPI 63YO female presents for acute visit.  Cough - Monday night developed cough productive of thick sputum. Also had sore throat. Started OTC throat spray with no improvement. Wednesday throat felt tight and irritated. No fever, chills. Feels short of breath at times. Cough keeping her awake at night.  Review of Systems  Constitutional: Positive for fatigue. Negative for fever, chills and unexpected weight change.  HENT: Positive for congestion, postnasal drip, rhinorrhea and sore throat. Negative for ear discharge, ear pain, facial swelling, hearing loss, mouth sores, nosebleeds, sinus pressure, sneezing, tinnitus, trouble swallowing and voice change.   Eyes: Negative for pain, discharge, redness and visual disturbance.  Respiratory: Positive for cough and shortness of breath. Negative for chest tightness, wheezing and stridor.   Cardiovascular: Negative for chest pain, palpitations and leg swelling.  Musculoskeletal: Negative for myalgias, arthralgias, neck pain and neck stiffness.  Skin: Negative for color change and rash.  Neurological: Negative for dizziness, weakness, light-headedness and headaches.  Hematological: Negative for adenopathy.       Objective:    BP 98/60 mmHg  Pulse 78  Temp(Src) 98.3 F (36.8 C) (Oral)  Ht 5' 3.5" (1.613 m)  Wt 174 lb 12 oz (79.266 kg)  BMI 30.47 kg/m2  SpO2 97% Physical Exam  Constitutional: She is oriented to person, place, and time. She appears well-developed and well-nourished. No distress.  HENT:  Head: Normocephalic and atraumatic.  Right Ear: External ear normal.  Left Ear: External ear normal.  Nose: Nose normal.  Mouth/Throat: Oropharynx is clear and moist. No oropharyngeal exudate.  Eyes: Conjunctivae are normal. Pupils are equal, round, and reactive to light. Right eye exhibits no discharge. Left eye exhibits no discharge. No  scleral icterus.  Neck: Normal range of motion. Neck supple. No tracheal deviation present. No thyromegaly present.  Cardiovascular: Normal rate, regular rhythm, normal heart sounds and intact distal pulses.  Exam reveals no gallop and no friction rub.   No murmur heard. Pulmonary/Chest: Effort normal. No accessory muscle usage. No tachypnea. No respiratory distress. She has no decreased breath sounds. She has wheezes (few scattered). She has rhonchi. She has no rales. She exhibits no tenderness.  Musculoskeletal: Normal range of motion. She exhibits no edema or tenderness.  Lymphadenopathy:    She has no cervical adenopathy.  Neurological: She is alert and oriented to person, place, and time. No cranial nerve deficit. She exhibits normal muscle tone. Coordination normal.  Skin: Skin is warm and dry. No rash noted. She is not diaphoretic. No erythema. No pallor.  Psychiatric: She has a normal mood and affect. Her behavior is normal. Judgment and thought content normal.          Assessment & Plan:   Problem List Items Addressed This Visit      Unprioritized   Acute bronchitis - Primary    Symptoms and exam consistent with early bronchitis. Will start Prednisone taper and Doxycycline. Continue prn Albuterol. Will add Tussionex as needed for cough. Follow up early next week if symptoms are not improving.     Relevant Medications      predniSONE (DELTASONE) tablet      DOXYCYCLINE HYCLATE 100 MG PO TABS       Return if symptoms worsen or fail to improve.

## 2014-03-20 NOTE — Telephone Encounter (Signed)
Work her in or UC?

## 2014-03-20 NOTE — Progress Notes (Signed)
Pre visit review using our clinic review tool, if applicable. No additional management support is needed unless otherwise documented below in the visit note. 

## 2014-03-20 NOTE — Telephone Encounter (Signed)
Do you want this pt worked in today?

## 2014-03-20 NOTE — Patient Instructions (Signed)
Start Doxycycline twice daily and Prednisone taper. Use Tussionex as needed for cough.  Acute Bronchitis Bronchitis is inflammation of the airways that extend from the windpipe into the lungs (bronchi). The inflammation often causes mucus to develop. This leads to a cough, which is the most common symptom of bronchitis.  In acute bronchitis, the condition usually develops suddenly and goes away over time, usually in a couple weeks. Smoking, allergies, and asthma can make bronchitis worse. Repeated episodes of bronchitis may cause further lung problems.  CAUSES Acute bronchitis is most often caused by the same virus that causes a cold. The virus can spread from person to person (contagious) through coughing, sneezing, and touching contaminated objects. SIGNS AND SYMPTOMS   Cough.   Fever.   Coughing up mucus.   Body aches.   Chest congestion.   Chills.   Shortness of breath.   Sore throat.  DIAGNOSIS  Acute bronchitis is usually diagnosed through a physical exam. Your health care provider will also ask you questions about your medical history. Tests, such as chest X-rays, are sometimes done to rule out other conditions.  TREATMENT  Acute bronchitis usually goes away in a couple weeks. Oftentimes, no medical treatment is necessary. Medicines are sometimes given for relief of fever or cough. Antibiotic medicines are usually not needed but may be prescribed in certain situations. In some cases, an inhaler may be recommended to help reduce shortness of breath and control the cough. A cool mist vaporizer may also be used to help thin bronchial secretions and make it easier to clear the chest.  HOME CARE INSTRUCTIONS  Get plenty of rest.   Drink enough fluids to keep your urine clear or pale yellow (unless you have a medical condition that requires fluid restriction). Increasing fluids may help thin your respiratory secretions (sputum) and reduce chest congestion, and it will  prevent dehydration.   Take medicines only as directed by your health care provider.  If you were prescribed an antibiotic medicine, finish it all even if you start to feel better.  Avoid smoking and secondhand smoke. Exposure to cigarette smoke or irritating chemicals will make bronchitis worse. If you are a smoker, consider using nicotine gum or skin patches to help control withdrawal symptoms. Quitting smoking will help your lungs heal faster.   Reduce the chances of another bout of acute bronchitis by washing your hands frequently, avoiding people with cold symptoms, and trying not to touch your hands to your mouth, nose, or eyes.   Keep all follow-up visits as directed by your health care provider.  SEEK MEDICAL CARE IF: Your symptoms do not improve after 1 week of treatment.  SEEK IMMEDIATE MEDICAL CARE IF:  You develop an increased fever or chills.   You have chest pain.   You have severe shortness of breath.  You have bloody sputum.   You develop dehydration.  You faint or repeatedly feel like you are going to pass out.  You develop repeated vomiting.  You develop a severe headache. MAKE SURE YOU:   Understand these instructions.  Will watch your condition.  Will get help right away if you are not doing well or get worse. Document Released: 05/25/2004 Document Revised: 09/01/2013 Document Reviewed: 10/08/2012 [facility] Patient Information 2015 [place], [place]. This information is not intended to replace advice given to you by your health care provider. Make sure you discuss any questions you have with your health care provider.

## 2014-03-23 ENCOUNTER — Encounter: Payer: Self-pay | Admitting: Internal Medicine

## 2014-03-25 ENCOUNTER — Ambulatory Visit: Payer: 59 | Admitting: Internal Medicine

## 2014-03-28 ENCOUNTER — Other Ambulatory Visit: Payer: Self-pay | Admitting: Internal Medicine

## 2014-03-28 ENCOUNTER — Encounter: Payer: Self-pay | Admitting: Internal Medicine

## 2014-03-28 ENCOUNTER — Telehealth: Payer: Self-pay | Admitting: *Deleted

## 2014-03-28 MED ORDER — MAGIC MOUTHWASH W/LIDOCAINE: 10.0000 mL | Freq: Four times a day (QID) | ORAL | Status: DC

## 2014-03-28 MED ORDER — FLUCONAZOLE 200 MG PO TABS: 200.0000 mg | ORAL_TABLET | Freq: Every day | ORAL | Status: DC

## 2014-03-28 NOTE — Telephone Encounter (Signed)
Nurse Horris Latino from [facility] called to inform a doc that pt was seen at [facility] on 11/20, pt was given doxy and prednisone, pt now has thrush.  Horris Latino states per patient that she usually gets the [employer] and something for thrush called in.  Please advise.  Thanks.

## 2014-03-28 NOTE — Telephone Encounter (Signed)
Rx sent to pharmacy and pt is aware. 

## 2014-03-28 NOTE — Telephone Encounter (Signed)
Will send in prescriptions, please call patient. And phone in prescription.

## 2014-03-29 ENCOUNTER — Other Ambulatory Visit: Payer: Self-pay | Admitting: Internal Medicine

## 2014-03-30 NOTE — Telephone Encounter (Signed)
Advise refill

## 2014-04-08 NOTE — Telephone Encounter (Signed)
Called pt, lvmom asking pt to call back

## 2014-04-10 ENCOUNTER — Ambulatory Visit (INDEPENDENT_AMBULATORY_CARE_PROVIDER_SITE_OTHER): Payer: 59 | Admitting: Nurse Practitioner

## 2014-04-10 ENCOUNTER — Encounter: Payer: Self-pay | Admitting: Nurse Practitioner

## 2014-04-10 VITALS — BP 141/89 | HR 80 | Temp 97.9°F | Resp 12 | Ht 63.5 in | Wt 177.8 lb

## 2014-04-10 DIAGNOSIS — R059 Cough, unspecified: Secondary | ICD-10-CM

## 2014-04-10 DIAGNOSIS — B370 Candidal stomatitis: Secondary | ICD-10-CM

## 2014-04-10 DIAGNOSIS — R05 Cough: Secondary | ICD-10-CM

## 2014-04-10 MED ORDER — FLUCONAZOLE 150 MG PO TABS
150.0000 mg | ORAL_TABLET | Freq: Once | ORAL | Status: DC
Start: 2014-04-10 — End: 2014-06-12

## 2014-04-10 NOTE — Assessment & Plan Note (Addendum)
Possible candida in throat (white stringy visualized on exam). Rx for fluconazole 150 mg 1 pill for three days. FU if symptoms chage, worsens, or fails to improve.

## 2014-04-10 NOTE — Progress Notes (Signed)
Pre visit review using our clinic review tool, if applicable. No additional management support is needed unless otherwise documented below in the visit note. 

## 2014-04-10 NOTE — Patient Instructions (Signed)
Your cough may be coming from post nasal drip (PND).  PND and allergies can be treated with benadryl,  Allegra, Zyrtec and claritin. Benadryl is the most effective for drying you up but it is also the most sedating,  so try taking it at night and use one of the others in the daytime  Add Coricidin as needed for congestion. This is good for patients with high BP.   "DM" stands for dextromethorphan which is a cough suppressant.  The best/strongest available OTC cough suppressant is Delsym.  Consider using simply Saline to flush your sinuses twice daily when you have congestion to prevent sinus infections   Call us if you do not have improvement next week (or send Dr. Gilford Rile a [facility]).   Happy Holidays!

## 2014-04-10 NOTE — Progress Notes (Signed)
Subjective:    Patient ID: Theodis Sato, female    DOB: 1950/11/18, 63 y.o.   MRN: [id number]  HPI Ms. Machi is a 63 yo female here with a CC of cough x 3 weeks.   1) Bronchitis - Seen on 03/20/14 by Dr. Gilford Rile tx plan: doxy 100 mg prednisone taper, tussionex. Pt had oral thrush after prednisone. 2 pills of fluconazole-helped.  Hot or talks too much still coughs some More drainage, pndrip, Rhinorrhea- green.  Gland tenderness R>L  Pt concerned today about sore throat and still possibly having thrush. Cough is improved she reports.   Review of Systems  Constitutional: Negative for fever, chills, diaphoresis and fatigue.  HENT: Positive for postnasal drip, rhinorrhea and sore throat. Negative for ear discharge, ear pain, sinus pressure, sneezing, trouble swallowing and voice change.   Respiratory: Positive for cough and shortness of breath. Negative for choking, chest tightness and wheezing.   Cardiovascular: Negative for chest pain, palpitations and leg swelling.  Gastrointestinal: Negative for nausea, vomiting and diarrhea.  Skin: Negative for rash.   Past Medical History  Diagnosis Date  . Hypertension   . Diabetes mellitus   . Thyroid disease   . Chronic sinusitis   . Headache(784.0)     [facility] in past    History   Social History  . Marital Status: Married    Spouse Name: N/A    Number of Children: 2  . Years of Education: N/A   Occupational History  . [employer]    Social History Main Topics  . Smoking status: Never Smoker   . Smokeless tobacco: Never Used  . Alcohol Use: Yes     Comment: occ  . Drug Use: No  . Sexual Activity: Not on file   Other Topics Concern  . Not on file   Social History Narrative   Lives with husband. No pets, 2 children.      Work - [place] - regular   Exercise - none presently    Past Surgical History  Procedure Laterality Date  . Nasal sinus surgery  06/2008  . Vaginal delivery       2  . Dental surgery    . Dental implants    . Bone graft      for dental surgery  . Colonoscopy  2014  . Carpal tunnel release Right [id number]    [facility]  . Trigger finger release Right [id number]    [facility]    Family History  Problem Relation Age of Onset  . COPD Father   . Hypertension Father   . Heart disease Father   . Heart disease Mother   . Diabetes Maternal Grandmother   . Colon cancer Neg Hx     Allergies  Allergen Reactions  . Clarithromycin     REACTION: Nausea  . Penicillins     REACTION: Throat swelling    Current Outpatient Prescriptions on File Prior to Visit  Medication Sig Dispense Refill  . Alum & Mag Hydroxide-Simeth (MAGIC MOUTHWASH W/LIDOCAINE) SOLN Take 10 mLs by mouth 4 (four) times daily. Swish for 2 minutes then spit out. 500 mL 1  . aspirin 81 MG tablet Take 81 mg by mouth daily.      . Betamethasone Valerate 0.12 % foam APPLY TO AFFECTED AREA EVERY DAY 100 g 0  . Casanthranol-Docusate Sodium 30-100 MG CAPS Take 1 tablet by mouth daily. 90 each 3  . Chlorpheniramine Maleate (ALLERGY RELIEF  PO) Take by mouth. Try ease    . chlorpheniramine-HYDROcodone (TUSSIONEX) 10-8 MG/5ML LQCR Take 5 mLs by mouth every 12 (twelve) hours as needed. 140 mL 0  . cholecalciferol (VITAMIN D) 1000 UNITS tablet Take 1,000 Units by mouth daily.    . DPH-Lido-AlHydr-MgHydr-Simeth (FIRST-MOUTHWASH BLM) SUSP TAKE 1 TEASPOONFUL BY MOUTH 3 TIMES A DAY AS NEEDED FOR PAIN 119 mL 0  . fish oil-omega-3 fatty acids 1000 MG capsule Take 1 g by mouth daily.      . fluticasone (FLONASE) 50 MCG/ACT nasal spray Use 2 sprays nasally daily 48 g 1  . glimepiride (AMARYL) 4 MG tablet Take 1 tablet by mouth  daily 90 tablet 1  . levothyroxine (SYNTHROID, LEVOTHROID) 75 MCG tablet Take 1 tablet by mouth  daily 90 tablet 1  . losartan-hydrochlorothiazide (HYZAAR) 100-12.5 MG per tablet Take 1 tablet by mouth daily. 90 tablet 3  . metFORMIN (GLUCOPHAGE) 1000 MG tablet Take 1 tablet by mouth  twice a  day with meals 90 tablet 1  . omeprazole (PRILOSEC) 20 MG capsule Take 20 mg by mouth daily.    . ONE TOUCH ULTRA TEST test strip Use 1 to check blood sugar  once daily and as needed 100 each 5  . ONETOUCH DELICA LANCETS 46T MISC Use 1 daily as directed 100 each 5  . oxybutynin (DITROPAN-XL) 5 MG 24 hr tablet Take 1 tablet by mouth once a day 90 tablet 1  . phentermine 15 MG capsule Take 1 capsule (15 mg total) by mouth every morning. 30 capsule 1  . Probiotic Product (PROBIOTIC DAILY PO) Take by mouth.     No current facility-administered medications on file prior to visit.         Objective:   Physical Exam  Constitutional: She is oriented to person, place, and time. She appears well-developed and well-nourished. No distress.  HENT:  Head: Normocephalic and atraumatic.  TM's clear bilaterally, White thick stringy substance in throat. Probable candida.   Eyes: Conjunctivae and EOM are normal. Pupils are equal, round, and reactive to light. Right eye exhibits no discharge. Left eye exhibits no discharge. No scleral icterus.  Neck: Normal range of motion. Neck supple. No thyromegaly present.  Cardiovascular: Normal rate and regular rhythm.   Pulmonary/Chest: Effort normal and breath sounds normal. No respiratory distress. She has no wheezes. She has no rales. She exhibits no tenderness.  Lymphadenopathy:    She has no cervical adenopathy.  Neurological: She is alert and oriented to person, place, and time.  Skin: Skin is warm and dry. No rash noted. She is not diaphoretic.  Psychiatric: She has a normal mood and affect. Her behavior is normal. Judgment and thought content normal.     BP 141/89 mmHg  Pulse 80  Temp(Src) 97.9 F (36.6 C)  Resp 12  Ht 5' 3.5" (1.613 m)  Wt 177 lb 12.8 oz (80.65 kg)  BMI 31.00 kg/m2  SpO2 97%      Assessment & Plan:

## 2014-04-12 DIAGNOSIS — R059 Cough, unspecified: Secondary | ICD-10-CM | POA: Insufficient documentation

## 2014-04-12 DIAGNOSIS — R05 Cough: Secondary | ICD-10-CM | POA: Insufficient documentation

## 2014-04-12 NOTE — Assessment & Plan Note (Signed)
Discussed the possible etiologies of persistent cough including but not limited to post nasal drip,  Allergic rhinitis,  Asthma,  GERD and chronic cyclic cough due to persistent irritation.  She is not taking and ACE Inhibitor.  Suggested treating for allergic rhinitis,  PND and candidiasis from prednisone. FU prn.

## 2014-04-15 ENCOUNTER — Other Ambulatory Visit: Payer: Self-pay | Admitting: Internal Medicine

## 2014-05-18 LAB — HM DIABETES EYE EXAM

## 2014-06-11 ENCOUNTER — Encounter: Payer: Self-pay | Admitting: Internal Medicine

## 2014-06-12 ENCOUNTER — Encounter: Payer: Self-pay | Admitting: Family Medicine

## 2014-06-12 ENCOUNTER — Other Ambulatory Visit: Payer: Self-pay | Admitting: *Deleted

## 2014-06-12 ENCOUNTER — Ambulatory Visit (INDEPENDENT_AMBULATORY_CARE_PROVIDER_SITE_OTHER): Payer: 59 | Admitting: Internal Medicine

## 2014-06-12 ENCOUNTER — Encounter: Payer: Self-pay | Admitting: Internal Medicine

## 2014-06-12 VITALS — BP 128/68 | HR 79 | Resp 14 | Ht 63.5 in | Wt 176.5 lb

## 2014-06-12 DIAGNOSIS — M5431 Sciatica, right side: Secondary | ICD-10-CM

## 2014-06-12 DIAGNOSIS — IMO0002 Reserved for concepts with insufficient information to code with codable children: Secondary | ICD-10-CM

## 2014-06-12 DIAGNOSIS — E1165 Type 2 diabetes mellitus with hyperglycemia: Secondary | ICD-10-CM

## 2014-06-12 DIAGNOSIS — E669 Obesity, unspecified: Secondary | ICD-10-CM

## 2014-06-12 MED ORDER — PHENTERMINE HCL 15 MG PO CAPS: 15.0000 mg | ORAL_CAPSULE | ORAL | Status: DC

## 2014-06-12 MED ORDER — GLIMEPIRIDE 4 MG PO TABS: ORAL_TABLET | ORAL | Status: DC

## 2014-06-12 NOTE — Assessment & Plan Note (Signed)
Some recent episodes of hypoglycemia. Will monitor closely. Will repeat A1c with labs. Encouraged her to eat at regular intervals when taking Glimepride.

## 2014-06-12 NOTE — Patient Instructions (Signed)
Labs today.  We will set up a visit with Dr. Tamala Julian

## 2014-06-12 NOTE — Assessment & Plan Note (Signed)
Wt Readings from Last 3 Encounters:  06/12/14 176 lb 8 oz (80.06 kg)  04/10/14 177 lb 12.8 oz (80.65 kg)  03/20/14 174 lb 12 oz (79.266 kg)   Body mass index is 30.77 kg/(m^2). Encouraged healthy diet and exercise. Will resume phentermine to help with appetite suppression. Follow up in 4 weeks and prn.

## 2014-06-12 NOTE — Assessment & Plan Note (Signed)
Symptoms are most consistent with right sciatica. Will set up sports medicine evaluation. Question if she might benefit from local steroid injection. Trying to avoid oral steroids because of DM.

## 2014-06-12 NOTE — Progress Notes (Signed)
Pre visit review using our clinic review tool, if applicable. No additional management support is needed unless otherwise documented below in the visit note. 

## 2014-06-12 NOTE — Progress Notes (Signed)
Subjective:    Patient ID: Whitney Knight, female    DOB: June 06, 1950, 64 y.o.   MRN: [id number]  HPI  64YO female presents for acute visit.  Right leg pain - 4-5 weeks ago noted pain in back of right hip. Made worse by sitting. Improved with standing. Pain runs down back of leg at times. No weakness or numbness. Taking Aleve with some improvement. Not interfering with pain.  Recently started doing resistance training.  DM - Compliant with medication. Having occasional BG<50, perhaps 2-3 times in last month. Symptomatic with sweating during these episodes. Takes some grape juice or candy with improvement,  Would like to start back on phentermine to help control appetite. Trying to follow healthy diet and exercise.  Wt Readings from Last 3 Encounters:  06/12/14 176 lb 8 oz (80.06 kg)  04/10/14 177 lb 12.8 oz (80.65 kg)  03/20/14 174 lb 12 oz (79.266 kg)     Past medical, surgical, family and social history per today's encounter.  Review of Systems  Constitutional: Negative for fever, chills, appetite change, fatigue and unexpected weight change.  Eyes: Negative for visual disturbance.  Respiratory: Negative for shortness of breath.   Cardiovascular: Negative for chest pain and leg swelling.  Gastrointestinal: Negative for nausea, abdominal pain, diarrhea and constipation.  Musculoskeletal: Positive for myalgias and arthralgias.  Skin: Negative for color change and rash.  Hematological: Negative for adenopathy. Does not bruise/bleed easily.  Psychiatric/Behavioral: Negative for dysphoric mood. The patient is not nervous/anxious.        Objective:    BP 128/68 mmHg  Pulse 79  Resp 14  Ht 5' 3.5" (1.613 m)  Wt 176 lb 8 oz (80.06 kg)  BMI 30.77 kg/m2  SpO2 99% Physical Exam  Constitutional: She is oriented to person, place, and time. She appears well-developed and well-nourished. No distress.  HENT:  Head: Normocephalic and atraumatic.  Right Ear: External ear normal.   Left Ear: External ear normal.  Nose: Nose normal.  Mouth/Throat: Oropharynx is clear and moist. No oropharyngeal exudate.  Eyes: Conjunctivae are normal. Pupils are equal, round, and reactive to light. Right eye exhibits no discharge. Left eye exhibits no discharge. No scleral icterus.  Neck: Normal range of motion. Neck supple. No tracheal deviation present. No thyromegaly present.  Cardiovascular: Normal rate, regular rhythm, normal heart sounds and intact distal pulses.  Exam reveals no gallop and no friction rub.   No murmur heard. Pulmonary/Chest: Effort normal and breath sounds normal. No respiratory distress. She has no wheezes. She has no rales. She exhibits no tenderness.  Musculoskeletal: Normal range of motion. She exhibits no edema or tenderness.       Lumbar back: She exhibits pain.       Back:  Lymphadenopathy:    She has no cervical adenopathy.  Neurological: She is alert and oriented to person, place, and time. No cranial nerve deficit. She exhibits normal muscle tone. Coordination normal.  Skin: Skin is warm and dry. No rash noted. She is not diaphoretic. No erythema. No pallor.  Psychiatric: She has a normal mood and affect. Her behavior is normal. Judgment and thought content normal.          Assessment & Plan:   Problem List Items Addressed This Visit      Unprioritized   Diabetes mellitus type 2, uncontrolled    Some recent episodes of hypoglycemia. Will monitor closely. Will repeat A1c with labs. Encouraged her to eat at regular intervals when taking Glimepride.  Relevant Orders   Comprehensive metabolic panel   Hemoglobin A1c   Lipid panel   Microalbumin / creatinine urine ratio   Obesity    Wt Readings from Last 3 Encounters:  06/12/14 176 lb 8 oz (80.06 kg)  04/10/14 177 lb 12.8 oz (80.65 kg)  03/20/14 174 lb 12 oz (79.266 kg)   Body mass index is 30.77 kg/(m^2). Encouraged healthy diet and exercise. Will resume phentermine to help with  appetite suppression. Follow up in 4 weeks and prn.      Relevant Medications   phentermine capsule   Right sciatic nerve pain - Primary    Symptoms are most consistent with right sciatica. Will set up sports medicine evaluation. Question if she might benefit from local steroid injection. Trying to avoid oral steroids because of DM.      Relevant Medications   phentermine capsule   Other Relevant Orders   Ambulatory referral to Sports Medicine       Return in about 4 weeks (around 07/10/2014) for Recheck.

## 2014-06-13 LAB — LIPID PANEL
Chol/HDL Ratio: 3.1 ratio units (ref 0.0–4.4)
Cholesterol, Total: 177 mg/dL (ref 100–199)
HDL: 57 mg/dL (ref >39)
LDL Calculated: 92 mg/dL (ref 0–99)
Triglycerides: 138 mg/dL (ref 0–149)
VLDL Cholesterol Cal: 28 mg/dL (ref 5–40)

## 2014-06-13 LAB — COMPREHENSIVE METABOLIC PANEL
ALK PHOS: 113 IU/L (ref 39–117)
ALT: 44 IU/L — ABNORMAL HIGH (ref 0–32)
AST: 48 IU/L — ABNORMAL HIGH (ref 0–40)
Albumin/Globulin Ratio: 1.6 (ref 1.1–2.5)
Albumin: 4.1 g/dL (ref 3.6–4.8)
BUN/Creatinine Ratio: 18 (ref 11–26)
BUN: 16 mg/dL (ref 8–27)
Bilirubin Total: 0.4 mg/dL (ref 0.0–1.2)
CO2: 26 mmol/L (ref 18–29)
CREATININE: 0.88 mg/dL (ref 0.57–1.00)
Calcium: 9.3 mg/dL (ref 8.7–10.3)
Chloride: 99 mmol/L (ref 97–108)
GFR calc Af Amer: 82 mL/min/{1.73_m2} (ref >59)
GFR calc non Af Amer: 71 mL/min/{1.73_m2} (ref >59)
GLUCOSE: 106 mg/dL — AB (ref 65–99)
Globulin, Total: 2.5 g/dL (ref 1.5–4.5)
Potassium: 4 mmol/L (ref 3.5–5.2)
Sodium: 139 mmol/L (ref 134–144)
TOTAL PROTEIN: 6.6 g/dL (ref 6.0–8.5)

## 2014-06-13 LAB — HEMOGLOBIN A1C
ESTIMATED AVERAGE GLUCOSE: 214 mg/dL
Hgb A1c MFr Bld: 9.1 % — ABNORMAL HIGH (ref 4.8–5.6)

## 2014-06-13 LAB — MICROALBUMIN / CREATININE URINE RATIO
CREATININE, UR: 38.9 mg/dL (ref 15.0–278.0)
MICROALB/CREAT RATIO: <7.7 mg/g creat (ref 0.0–30.0)
Microalbumin, Urine: <3 ug/mL (ref 0.0–17.0)

## 2014-06-17 ENCOUNTER — Ambulatory Visit (INDEPENDENT_AMBULATORY_CARE_PROVIDER_SITE_OTHER): Payer: 59 | Admitting: Family Medicine

## 2014-06-17 ENCOUNTER — Encounter: Payer: Self-pay | Admitting: Family Medicine

## 2014-06-17 VITALS — BP 132/80 | HR 94 | Ht 63.5 in | Wt 174.0 lb

## 2014-06-17 DIAGNOSIS — M222X9 Patellofemoral disorders, unspecified knee: Secondary | ICD-10-CM | POA: Insufficient documentation

## 2014-06-17 DIAGNOSIS — M7071 Other bursitis of hip, right hip: Secondary | ICD-10-CM | POA: Insufficient documentation

## 2014-06-17 MED ORDER — MELOXICAM 15 MG PO TABS: 15.0000 mg | ORAL_TABLET | Freq: Every day | ORAL | Status: DC

## 2014-06-17 NOTE — Addendum Note (Signed)
Addended by: Cresenciano Lick on: 06/17/2014 05:08 PM   Modules accepted: Orders

## 2014-06-17 NOTE — Patient Instructions (Addendum)
Good to meet you Ice 20 minutes before bed. Exercises 3 times a week.  When sitting put coat or towel under legs.  Try pennsaid twice daily Meloxicam daily for 10 days Consider compression shorts from [employer] or [employer].  Diagnosis is ischial bursitis and petellofemoral syndrome.  See me again in 3 weeks.

## 2014-06-17 NOTE — Assessment & Plan Note (Signed)
I believe the patient is having more of an ischial bursitis of the right side. I believe that the inflammation the patient is having is giving her some of the radiation down her leg. Differential also includes lumbar radiculopathy. Patient does not have any significant signs of piriformis syndrome today. We discussed with patient's leg length discrepancy a heel lift could be beneficial. Patient did work with the a Designer, industrial/product today to learn home exercises in greater detail. We discussed icing regimen and we discussed ergonomic changes she can make at work that might be beneficial. Patient and will try to make these changes as well as an icing protocol and will come back and see me in 3 weeks. Continuing to have the discomfort we will consider ultrasound guided injection.

## 2014-06-17 NOTE — Assessment & Plan Note (Signed)
Patellofemoral Syndrome  Reviewed anatomy using anatomical model and how PFS occurs.  Given rehab exercises handout for VMO, hip abductors, core, entire kinetic chain including proprioception exercises including cone touches, step downs, hip elevations and turn outs.  Could benefit from PT, regular exercise, upright biking, and a PFS knee brace to assist with tracking abnormalities.  workedwith AT today as well.  RTC in 3 weeks ? Xray and injections.

## 2014-06-17 NOTE — Progress Notes (Signed)
Pre visit review using our clinic review tool, if applicable. No additional management support is needed unless otherwise documented below in the visit note. 

## 2014-06-17 NOTE — Progress Notes (Signed)
[name] [place] [place], [place] [address] Phone: [phone] Subjective:    I'm seeing this patient by the request  of: Rica Mast, MD    CC:  Buttocks pain and bilateral knee pain  Whitney Knight is a 64 y.o. female coming in with complaint of  2 different problems. 1.   Right-sided buttocks pain. Patient has been diagnosed with a right sciatic nerve pain and questionable piriformis. Patient states that it seems to be worse with sitting. Patient states that it is very uncomfortable to sit. States when she starts walking it seems to. Denies any tightness when she gets up immediately. States that the pain does radiate down her leg but denies any back pain that seems to be associated with it. Patient rates the severity of pain a 7 out of 10. This does change some of her activities and patient is not going on vacation because it would be a long car ride. Denies any weakness or numbness of the lower extremity and denies any nighttime awakening.   Patient also states that she does have bilateral knee pain. States that there is a lot of cracking that can be painful especially when she is going up or downstairs. Patient states that she has stopped hiking because of this pain. Denies any radiation of pain or any numbness or tingling. Patient sleeps with a pillow between her knees because it does feel better.     Past medical history, social, surgical and family history all reviewed in electronic medical record.   Review of Systems: No headache, visual changes, nausea, vomiting, diarrhea, constipation, dizziness, abdominal pain, skin rash, fevers, chills, night sweats, weight loss, swollen lymph nodes, body aches, joint swelling, muscle aches, chest pain, shortness of breath, mood changes.   Objective Blood pressure 132/80, pulse 94, weight 174 lb (78.926 kg).  General: No apparent distress alert and oriented x3 mood and affect  normal, dressed appropriately.  HEENT: Pupils equal, extraocular movements intact  Respiratory: Patient's speak in full sentences and does not appear short of breath  Cardiovascular: No lower extremity edema, non tender, no erythema  Skin: Warm dry intact with no signs of infection or rash on extremities or on axial skeleton.  Abdomen: Soft nontender  Neuro: Cranial nerves II through XII are intact, neurovascularly intact in all extremities with 2+ DTRs and 2+ pulses.  Lymph: No lymphadenopathy of posterior or anterior cervical chain or axillae bilaterally.  Gait normal with good balance and coordination.  MSK:  Non tender with full range of motion and good stability and symmetric strength and tone of shoulders, elbows, wrist,  and ankles bilaterally.  Some mild atrophy of the legs bilaterally  Hip exam shows the patient does have some tenderness to palpation over the ischial bursa sac. Patient has full range of motion and a negative Faber test. Patient is nontender over the piriformis itself. Negative straight leg test of the back.  Contralateral hip unremarkable Kneebilateral: Inspection shows significant lateralization of the kneecaps with lateral tracking with range of motion bilaterally. Palpation reveals medial joint line tenderness as well as lateral superior patellar tenderness ROM full in flexion and extension and lower leg rotation. Ligaments with solid consistent endpoints including ACL, PCL, LCL, MCL. Negative Mcmurray's, Apley's, and Thessalonian tests.  painful patellar compression. Patellar glide with moderate crepitus. Patellar and quadriceps tendons unremarkable. Hamstring and quadriceps strength is normal.   Procedure note 44010; 15 minutes spent for Therapeutic exercises as stated in  above notes.  This included exercises focusing on stretching, strengthening, with significant focus on eccentric aspects.   Proper technique shown and discussed handout in great detail with  ATC.  All questions were discussed and answered.      Impression and Recommendations:     This case required medical decision making of moderate complexity.

## 2014-06-18 ENCOUNTER — Ambulatory Visit: Payer: Self-pay | Admitting: Internal Medicine

## 2014-06-18 MED ORDER — MELOXICAM 15 MG PO TABS: 15.0000 mg | ORAL_TABLET | Freq: Every day | ORAL | Status: DC

## 2014-06-24 ENCOUNTER — Other Ambulatory Visit: Payer: Self-pay | Admitting: Family Medicine

## 2014-06-24 NOTE — Telephone Encounter (Signed)
Refill done.  

## 2014-07-01 ENCOUNTER — Encounter: Payer: Self-pay | Admitting: Internal Medicine

## 2014-07-08 ENCOUNTER — Other Ambulatory Visit (INDEPENDENT_AMBULATORY_CARE_PROVIDER_SITE_OTHER): Payer: 59

## 2014-07-08 ENCOUNTER — Encounter: Payer: Self-pay | Admitting: Family Medicine

## 2014-07-08 ENCOUNTER — Ambulatory Visit (INDEPENDENT_AMBULATORY_CARE_PROVIDER_SITE_OTHER): Payer: 59 | Admitting: Family Medicine

## 2014-07-08 VITALS — BP 120/84 | HR 86 | Ht 63.5 in | Wt 176.0 lb

## 2014-07-08 DIAGNOSIS — M25551 Pain in right hip: Secondary | ICD-10-CM

## 2014-07-08 DIAGNOSIS — M7071 Other bursitis of hip, right hip: Secondary | ICD-10-CM

## 2014-07-08 DIAGNOSIS — M222X9 Patellofemoral disorders, unspecified knee: Secondary | ICD-10-CM

## 2014-07-08 NOTE — Assessment & Plan Note (Signed)
Improving at this time but still has significant atrophy of the musculature of the quadriceps and hamstrings bilaterally. Patient though states that she is not having any significant back pain. We will need to discussed diet changes to see if we can help with this if this continues. Patient could be a candidate for injections and we may need to consider x-rays at patient's pain continues. See patient again in 3 weeks

## 2014-07-08 NOTE — Progress Notes (Signed)
[name] [address] [place], [place] [address] Phone: [phone] Subjective:    I'm seeing this patient by the request  of: Rica Mast, MD    CC:  Buttocks pain and bilateral knee pain follow up  Whitney Knight is a 64 y.o. female coming in with complaint of  2 different problems. 1.   Right-sided buttocks pain. Patient was found to have a potential ischial bursitis. Patient was given exercises to work on gluteal strengthening as well as hamstring stretching. We discussed icing as well as manual massage. Patient was to do over-the-counter natural supplementations. Patient states she is approximately 20% better. Still after sitting long and a 15 minutes in any type of position she starts having a dull throbbing aching pain. Patient states it is more uncomfortable than a true pain now. Patient has started to increase her activity again. Patient though states that this is affecting her daily activities and she has avoided to vacation secondary to having to travel in a car or on an airplane for a long duration.  Patient's knees also have a patellofemoral pain syndrome bilaterally. Worse with going up or downstairs. Patient was given a brace, topical antibiotic towards, home exercises and once again discussed over-the-counter supplementation. Patient states these are doing significantly better. Patient states that she is a proximal weight 70% better. Able to go up and down stairs without as much popping clicking or pain. Patient has been doing the exercises intermittently. Does not do the icing regularly. Has stopped that topical anti-inflammatory but continues to take meloxicam fairly regularly.     Past medical history, social, surgical and family history all reviewed in electronic medical record.   Review of Systems: No headache, visual changes, nausea, vomiting, diarrhea, constipation, dizziness, abdominal pain, skin rash, fevers,  chills, night sweats, weight loss, swollen lymph nodes, body aches, joint swelling, muscle aches, chest pain, shortness of breath, mood changes.   Objective Blood pressure 120/84, pulse 86, height 5' 3.5" (1.613 m), weight 176 lb (79.833 kg).  General: No apparent distress alert and oriented x3 mood and affect normal, dressed appropriately.  HEENT: Pupils equal, extraocular movements intact  Respiratory: Patient's speak in full sentences and does not appear short of breath  Cardiovascular: No lower extremity edema, non tender, no erythema  Skin: Warm dry intact with no signs of infection or rash on extremities or on axial skeleton.  Abdomen: Soft nontender  Neuro: Cranial nerves II through XII are intact, neurovascularly intact in all extremities with 2+ DTRs and 2+ pulses.  Lymph: No lymphadenopathy of posterior or anterior cervical chain or axillae bilaterally.  Gait normal with good balance and coordination.  MSK:  Non tender with full range of motion and good stability and symmetric strength and tone of shoulders, elbows, wrist,  and ankles bilaterally.  Some mild atrophy of the legs bilaterally  Hip exam shows the patient does have some tenderness to palpation over the ischial bursa on right still. Patient has full range of motion and a negative Faber test. Patient is nontender over the piriformis itself. Negative straight leg test of the back.  Contralateral hip unremarkable No significant change from previous exam  Knee bilateral: Inspection shows significant lateralization of the kneecaps with lateral tracking with range of motion bilaterally. Palpation reveals medial joint line tenderness as well as lateral superior patellar tenderness but significantly less than previous exam ROM full in flexion and extension and lower leg rotation. Ligaments with solid consistent  endpoints including ACL, PCL, LCL, MCL. Negative Mcmurray's, Apley's, and Thessalonian tests.  painful patellar  compression. Patellar glide with mild to moderate crepitus. Patellar and quadriceps tendons unremarkable. Hamstring and quadriceps strength is normal.   MSK US performed of: Right buttock area This study was ordered, performed, and interpreted by Charlann Boxer D.O.  Hip: Looking at patient's ischial bursa on the right side does have significant enlargement and hypoechoic changes  IMPRESSION:  Ischial bursitis  Procedure: Real-time Ultrasound Guided Injection of right ischial bursa Device: GE Logiq E  Ultrasound guided injection is preferred based studies that show increased duration, increased effect, greater accuracy, decreased procedural pain, increased response rate, and decreased cost with ultrasound guided versus blind injection.  Verbal informed consent obtained.  Time-out conducted.  Noted no overlying erythema, induration, or other signs of local infection.  Skin prepped in a sterile fashion.  Local anesthesia: Topical Ethyl chloride.  With sterile technique and under real time ultrasound guidance: With a 22-gauge 3 inch needle patient was injected with 2 mL of 0.5% Marcaine and 1 mL of total of 40 mg/dL.  Completed without difficulty  Pain immediately resolved suggesting accurate placement of the medication.  Advised to call if fevers/chills, erythema, induration, drainage, or persistent bleeding.  Images permanently stored and available for review in the ultrasound unit.  Impression: Technically successful ultrasound guided injection.     Impression and Recommendations:     This case required medical decision making of moderate complexity.

## 2014-07-08 NOTE — Progress Notes (Signed)
Pre visit review using our clinic review tool, if applicable. No additional management support is needed unless otherwise documented below in the visit note. 

## 2014-07-08 NOTE — Patient Instructions (Addendum)
Good to see you Continue with the ice and the exercises take 1 week off the restart We did an injection today I think you will do great see me in 3 weeks.

## 2014-07-08 NOTE — Assessment & Plan Note (Signed)
Patient was given an injection today and tolerated the procedure very well. We discussed icing regimen and home exercises. We discussed possibility of a compression sleeve as well as padding with sitting. Patient will take one week off from her exercises and then start doing them on a more regular basis. We discussed the patient likely should make improvement over the course the next several weeks. Patient and will come back and see me again in 3 weeks for further evaluation and treatment.

## 2014-07-16 ENCOUNTER — Ambulatory Visit: Payer: 59 | Admitting: Internal Medicine

## 2014-07-28 ENCOUNTER — Ambulatory Visit: Payer: 59 | Admitting: Family Medicine

## 2014-07-28 ENCOUNTER — Other Ambulatory Visit: Payer: Self-pay | Admitting: Internal Medicine

## 2014-08-15 ENCOUNTER — Other Ambulatory Visit: Payer: Self-pay | Admitting: Internal Medicine

## 2014-10-16 ENCOUNTER — Encounter: Payer: Self-pay | Admitting: Internal Medicine

## 2014-10-26 ENCOUNTER — Other Ambulatory Visit: Payer: Self-pay

## 2014-11-17 ENCOUNTER — Other Ambulatory Visit: Payer: Self-pay | Admitting: Internal Medicine

## 2014-11-18 ENCOUNTER — Other Ambulatory Visit: Payer: Self-pay | Admitting: Internal Medicine

## 2014-12-16 ENCOUNTER — Other Ambulatory Visit: Payer: Self-pay | Admitting: Internal Medicine

## 2014-12-23 LAB — BASIC METABOLIC PANEL
CREATININE: 0.9 mg/dL (ref 0.5–1.1)
Glucose: 145 mg/dL

## 2014-12-23 LAB — LIPID PANEL
Cholesterol: 168 mg/dL (ref 0–200)
HDL: 54 mg/dL (ref 35–70)
LDL CALC: 95 mg/dL
Triglycerides: 94 mg/dL (ref 40–160)

## 2014-12-23 LAB — HEMOGLOBIN A1C: HEMOGLOBIN A1C: 8.6 % — AB (ref 4.0–6.0)

## 2015-01-20 ENCOUNTER — Other Ambulatory Visit: Payer: Self-pay | Admitting: Internal Medicine

## 2015-01-25 ENCOUNTER — Encounter: Payer: Self-pay | Admitting: Internal Medicine

## 2015-01-25 ENCOUNTER — Ambulatory Visit (INDEPENDENT_AMBULATORY_CARE_PROVIDER_SITE_OTHER): Payer: 59 | Admitting: Internal Medicine

## 2015-01-25 VITALS — BP 127/77 | HR 72 | Temp 98.0°F | Ht 63.5 in | Wt 179.4 lb

## 2015-01-25 DIAGNOSIS — E669 Obesity, unspecified: Secondary | ICD-10-CM | POA: Diagnosis not present

## 2015-01-25 DIAGNOSIS — R059 Cough, unspecified: Secondary | ICD-10-CM

## 2015-01-25 DIAGNOSIS — Z23 Encounter for immunization: Secondary | ICD-10-CM | POA: Diagnosis not present

## 2015-01-25 DIAGNOSIS — R05 Cough: Secondary | ICD-10-CM

## 2015-01-25 DIAGNOSIS — I10 Essential (primary) hypertension: Secondary | ICD-10-CM

## 2015-01-25 DIAGNOSIS — E1165 Type 2 diabetes mellitus with hyperglycemia: Secondary | ICD-10-CM

## 2015-01-25 DIAGNOSIS — IMO0002 Reserved for concepts with insufficient information to code with codable children: Secondary | ICD-10-CM

## 2015-01-25 MED ORDER — ONETOUCH DELICA LANCETS 33G MISC: Status: DC

## 2015-01-25 MED ORDER — GLUCOSE BLOOD VI STRP: ORAL_STRIP | Status: DC

## 2015-01-25 MED ORDER — LIRAGLUTIDE 18 MG/3ML ~~LOC~~ SOPN: 0.6000 mg | PEN_INJECTOR | Freq: Every day | SUBCUTANEOUS | Status: DC

## 2015-01-25 MED ORDER — PHENTERMINE HCL 15 MG PO CAPS: 15.0000 mg | ORAL_CAPSULE | ORAL | Status: DC

## 2015-01-25 NOTE — Assessment & Plan Note (Addendum)
Intermittent chronic cough, likely related to allergies. Exam normal today. Will continue Zyrtec and Flonase. Follow up prn.

## 2015-01-25 NOTE — Assessment & Plan Note (Signed)
Wt Readings from Last 3 Encounters:  01/25/15 179 lb 6 oz (81.364 kg)  07/08/14 176 lb (79.833 kg)  06/17/14 174 lb (78.926 kg)   Body mass index is 31.27 kg/(m^2). Encouraged healthy diet and exercise. Will restartPhentermine to help with appetite suppression. Also adding Victoza which should help with appetite. Follow up in 4 weeks.

## 2015-01-25 NOTE — Progress Notes (Signed)
Subjective:    Patient ID: Whitney Knight, female    DOB: 04-Sep-1950, 64 y.o.   MRN: [id number]  HPI  64YO female presents for follow up.  A1c at work 8.6% Compliant with medications. Working on diet. Exercising at work. Frustrated by lack of weight loss. Would like to restart Phentermine.  Also notes some chronic cough. Worsened when she does not take Zyrtec. No fever, chills, dyspnea.    Wt Readings from Last 3 Encounters:  01/25/15 179 lb 6 oz (81.364 kg)  07/08/14 176 lb (79.833 kg)  06/17/14 174 lb (78.926 kg)   BP Readings from Last 3 Encounters:  01/25/15 127/77  07/08/14 120/84  06/17/14 132/80   Lab Results  Component Value Date   HGBA1C 9.1* 06/12/2014     Past Medical History  Diagnosis Date  . Hypertension   . Diabetes mellitus   . Thyroid disease   . Chronic sinusitis   . Headache(784.0)     [facility] in past   Family History  Problem Relation Age of Onset  . COPD Father   . Hypertension Father   . Heart disease Father   . Heart disease Mother   . Diabetes Maternal Grandmother   . Colon cancer Neg Hx    Past Surgical History  Procedure Laterality Date  . Nasal sinus surgery  06/2008  . Vaginal delivery      2  . Dental surgery    . Dental implants    . Bone graft      for dental surgery  . Colonoscopy  2014  . Carpal tunnel release Right [id number]    [facility]  . Trigger finger release Right [id number]    [facility]   Social History   Social History  . Marital Status: Married    Spouse Name: N/A  . Number of Children: 2  . Years of Education: N/A   Occupational History  . [employer]    Social History Main Topics  . Smoking status: Never Smoker   . Smokeless tobacco: Never Used  . Alcohol Use: Yes     Comment: occ  . Drug Use: No  . Sexual Activity: Not Asked   Other Topics Concern  . None   Social History Narrative   Lives with husband. No pets, 2 children.      Work - [employer] - regular   Exercise - none presently    Review of Systems  Constitutional: Negative for fever, chills, appetite change, fatigue and unexpected weight change.  Eyes: Negative for visual disturbance.  Respiratory: Positive for cough. Negative for shortness of breath and wheezing.   Cardiovascular: Negative for chest pain, palpitations and leg swelling.  Gastrointestinal: Negative for nausea, vomiting, abdominal pain, diarrhea and constipation.  Musculoskeletal: Negative for myalgias and arthralgias.  Skin: Negative for color change and rash.  Hematological: Negative for adenopathy. Does not bruise/bleed easily.  Psychiatric/Behavioral: Negative for sleep disturbance and dysphoric mood. The patient is not nervous/anxious.        Objective:    BP 127/77 mmHg  Pulse 72  Temp(Src) 98 F (36.7 C) (Oral)  Ht 5' 3.5" (1.613 m)  Wt 179 lb 6 oz (81.364 kg)  BMI 31.27 kg/m2  SpO2 100% Physical Exam  Constitutional: She is oriented to person, place, and time. She appears well-developed and well-nourished. No distress.  HENT:  Head: Normocephalic and atraumatic.  Right Ear: External ear normal.  Left Ear: External ear normal.  Nose: Nose normal.  Mouth/Throat: Oropharynx is clear and moist. No oropharyngeal exudate.  Eyes: Conjunctivae are normal. Pupils are equal, round, and reactive to light. Right eye exhibits no discharge. Left eye exhibits no discharge. No scleral icterus.  Neck: Normal range of motion. Neck supple. No tracheal deviation present. No thyromegaly present.  Cardiovascular: Normal rate, regular rhythm, normal heart sounds and intact distal pulses.  Exam reveals no gallop and no friction rub.   No murmur heard. Pulmonary/Chest: Effort normal and breath sounds normal. No accessory muscle usage. No respiratory distress. She has no decreased breath sounds. She has no wheezes. She has no rales. She exhibits no tenderness.  Musculoskeletal: Normal range of motion. She exhibits no edema or  tenderness.  Lymphadenopathy:    She has no cervical adenopathy.  Neurological: She is alert and oriented to person, place, and time. No cranial nerve deficit. She exhibits normal muscle tone. Coordination normal.  Skin: Skin is warm and dry. No rash noted. She is not diaphoretic. No erythema. No pallor.  Psychiatric: She has a normal mood and affect. Her behavior is normal. Judgment and thought content normal.          Assessment & Plan:   Problem List Items Addressed This Visit      Unprioritized   Cough    Intermittent chronic cough, likely related to allergies. Exam normal today. Will continue Zyrtec and Flonase. Follow up prn.      Diabetes mellitus type 2, uncontrolled    BG continue to be elevated. Will add Victoza 0.6mg  daily. Discussed potential side effects from this medication. Follow up 4 weeks and prn.      Relevant Medications   Liraglutide (VICTOZA) 18 MG/3ML SOPN   Hypertension - Primary    BP Readings from Last 3 Encounters:  01/25/15 127/77  07/08/14 120/84  06/17/14 132/80   BP well controlled. Recent renal function normal. Continue Current medications.      Obesity    Wt Readings from Last 3 Encounters:  01/25/15 179 lb 6 oz (81.364 kg)  07/08/14 176 lb (79.833 kg)  06/17/14 174 lb (78.926 kg)   Body mass index is 31.27 kg/(m^2). Encouraged healthy diet and exercise. Will restartPhentermine to help with appetite suppression. Also adding Victoza which should help with appetite. Follow up in 4 weeks.      Relevant Medications   Liraglutide (VICTOZA) 18 MG/3ML SOPN   phentermine 15 MG capsule       Return in about 4 weeks (around 02/22/2015) for Recheck.

## 2015-01-25 NOTE — Assessment & Plan Note (Signed)
BG continue to be elevated. Will add Victoza 0.6mg  daily. Discussed potential side effects from this medication. Follow up 4 weeks and prn.

## 2015-01-25 NOTE — Patient Instructions (Signed)
Start Victoza 0.6mg  injected daily.  Follow up in 4 weeks.

## 2015-01-25 NOTE — Progress Notes (Signed)
Pre visit review using our clinic review tool, if applicable. No additional management support is needed unless otherwise documented below in the visit note. 

## 2015-01-25 NOTE — Assessment & Plan Note (Signed)
BP Readings from Last 3 Encounters:  01/25/15 127/77  07/08/14 120/84  06/17/14 132/80   BP well controlled. Recent renal function normal. Continue Current medications.

## 2015-02-08 ENCOUNTER — Encounter: Payer: Self-pay | Admitting: Internal Medicine

## 2015-02-10 ENCOUNTER — Encounter: Payer: Self-pay | Admitting: Internal Medicine

## 2015-02-10 ENCOUNTER — Ambulatory Visit (INDEPENDENT_AMBULATORY_CARE_PROVIDER_SITE_OTHER): Payer: 59 | Admitting: Internal Medicine

## 2015-02-10 VITALS — BP 137/84 | HR 71 | Temp 98.2°F | Ht 63.5 in | Wt 176.0 lb

## 2015-02-10 DIAGNOSIS — R682 Dry mouth, unspecified: Secondary | ICD-10-CM | POA: Insufficient documentation

## 2015-02-10 DIAGNOSIS — R1310 Dysphagia, unspecified: Secondary | ICD-10-CM | POA: Diagnosis not present

## 2015-02-10 DIAGNOSIS — E669 Obesity, unspecified: Secondary | ICD-10-CM

## 2015-02-10 DIAGNOSIS — E1165 Type 2 diabetes mellitus with hyperglycemia: Secondary | ICD-10-CM | POA: Diagnosis not present

## 2015-02-10 DIAGNOSIS — R05 Cough: Secondary | ICD-10-CM

## 2015-02-10 DIAGNOSIS — R059 Cough, unspecified: Secondary | ICD-10-CM

## 2015-02-10 DIAGNOSIS — IMO0001 Reserved for inherently not codable concepts without codable children: Secondary | ICD-10-CM

## 2015-02-10 MED ORDER — LIRAGLUTIDE 18 MG/3ML ~~LOC~~ SOPN: 1.2000 mg | PEN_INJECTOR | Freq: Every day | SUBCUTANEOUS | Status: DC

## 2015-02-10 NOTE — Assessment & Plan Note (Signed)
Chronic cough, recently worsened. Suspect dry oral mucosa may be triggering cough. She is using hard candy with minimal improvement. She has been evaluated by ENT. Will send testing for Sjogrens. GERD may also be playing a role. She is taking Omeprazole. Will continue. Will set up GI evaluation for possible follow up endoscopy. Follow up here in 4 weeks.

## 2015-02-10 NOTE — Progress Notes (Signed)
Subjective:    Patient ID: Whitney Knight, female    DOB: 13-Jan-1951, 64 y.o.   MRN: [id number]  HPI  64YO female presents for follow up.  Cough - Has dry mouth and dry cough over last 3-4 months. Tried taking Zyrtec for allergies which made things worse. Food often stuck in upper esophagus because of dryness. Eating hard candy and sipping on liquids with no improvement. No nasal congestion. No productive cough. No dyspnea. No fever, chills. Had prolonged episode of bronchitis and cough in the past after a trip to [place].  DM - BG near 110-119 mostly. Started Victoza. Max 140s. No side effects noted from medication.  Wt Readings from Last 3 Encounters:  02/10/15 176 lb (79.833 kg)  01/25/15 179 lb 6 oz (81.364 kg)  07/08/14 176 lb (79.833 kg)   BP Readings from Last 3 Encounters:  02/10/15 137/84  01/25/15 127/77  07/08/14 120/84    Past Medical History  Diagnosis Date  . Hypertension   . Diabetes mellitus   . Thyroid disease   . Chronic sinusitis   . Headache(784.0)     [facility] in past   Family History  Problem Relation Age of Onset  . COPD Father   . Hypertension Father   . Heart disease Father   . Heart disease Mother   . Diabetes Maternal Grandmother   . Colon cancer Neg Hx    Past Surgical History  Procedure Laterality Date  . Nasal sinus surgery  06/2008  . Vaginal delivery      2  . Dental surgery    . Dental implants    . Bone graft      for dental surgery  . Colonoscopy  2014  . Carpal tunnel release Right [id number]    [facility]  . Trigger finger release Right [id number]    [facility]   Social History   Social History  . Marital Status: Married    Spouse Name: N/A  . Number of Children: 2  . Years of Education: N/A   Occupational History  . [employer]    Social History Main Topics  . Smoking status: Never Smoker   . Smokeless tobacco: Never Used  . Alcohol Use: Yes     Comment: occ  . Drug Use: No  . Sexual Activity: Not Asked    Other Topics Concern  . None   Social History Narrative   Lives with husband. No pets, 2 children.      Work - [employer] - regular   Exercise - none presently    Review of Systems  Constitutional: Negative for fever, chills, appetite change, fatigue and unexpected weight change.  HENT: Positive for trouble swallowing and voice change. Negative for congestion, drooling, mouth sores, postnasal drip and rhinorrhea.   Eyes: Negative for visual disturbance.  Respiratory: Positive for cough. Negative for choking, chest tightness, shortness of breath and wheezing.   Cardiovascular: Negative for chest pain, palpitations and leg swelling.  Gastrointestinal: Negative for abdominal pain, diarrhea and constipation.  Skin: Negative for color change and rash.  Hematological: Negative for adenopathy. Does not bruise/bleed easily.  Psychiatric/Behavioral: Negative for sleep disturbance and dysphoric mood. The patient is not nervous/anxious.        Objective:    BP 137/84 mmHg  Pulse 71  Temp(Src) 98.2 F (36.8 C) (Oral)  Ht 5' 3.5" (1.613 m)  Wt 176 lb (79.833 kg)  BMI 30.68 kg/m2  SpO2 98%  Physical Exam  Constitutional: She is oriented to person, place, and time. She appears well-developed and well-nourished. No distress.  HENT:  Head: Normocephalic and atraumatic.  Right Ear: Tympanic membrane and external ear normal.  Left Ear: Tympanic membrane and external ear normal.  Nose: Nose normal.  Mouth/Throat: Oropharynx is clear and moist. Mucous membranes are dry. No oropharyngeal exudate or posterior oropharyngeal erythema.  Eyes: Conjunctivae are normal. Pupils are equal, round, and reactive to light. Right eye exhibits no discharge. Left eye exhibits no discharge. No scleral icterus.  Neck: Normal range of motion. Neck supple. No tracheal deviation present. No thyromegaly present.  Cardiovascular: Normal rate, regular rhythm, normal heart sounds and intact distal pulses.   Exam reveals no gallop and no friction rub.   No murmur heard. Pulmonary/Chest: Effort normal and breath sounds normal. No respiratory distress. She has no wheezes. She has no rales. She exhibits no tenderness.  Musculoskeletal: Normal range of motion. She exhibits no edema or tenderness.  Lymphadenopathy:    She has no cervical adenopathy.  Neurological: She is alert and oriented to person, place, and time. No cranial nerve deficit. She exhibits normal muscle tone. Coordination normal.  Skin: Skin is warm and dry. No rash noted. She is not diaphoretic. No erythema. No pallor.  Psychiatric: She has a normal mood and affect. Her behavior is normal. Judgment and thought content normal.          Assessment & Plan:   Problem List Items Addressed This Visit      Unprioritized   Cough - Primary    Chronic cough, recently worsened. Suspect dry oral mucosa may be triggering cough. She is using hard candy with minimal improvement. She has been evaluated by ENT. Will send testing for Sjogrens. GERD may also be playing a role. She is taking Omeprazole. Will continue. Will set up GI evaluation for possible follow up endoscopy. Follow up here in 4 weeks.      Diabetes mellitus type 2, uncontrolled ([place])    Tolerating Victoza well. Will increase dosing to 1.2mg  daily. Follow up in 4 weeks.      Relevant Medications   Liraglutide (VICTOZA) 18 MG/3ML SOPN   Dry mouth    Dry mouth, chronic. Will send testing for Sjogrens today.       Relevant Orders   ANA   Sjogrens syndrome-A extractable nuclear antibody   Sjogrens syndrome-B extractable nuclear antibody   Dysphagia    Dysphagia exacerbated by dry mouth. Question if she may also have recurrent esophageal narrowing. Will set up GI evaluation for possible endoscopy. Will also send lab evaluation for Sjogrens.      Relevant Orders   Ambulatory referral to Gastroenterology   Obesity    Wt Readings from Last 3 Encounters:  02/10/15 176 lb  (79.833 kg)  01/25/15 179 lb 6 oz (81.364 kg)  07/08/14 176 lb (79.833 kg)   Congratulated pt on weight loss. Continue Victoza.       Relevant Medications   Liraglutide (VICTOZA) 18 MG/3ML SOPN       Return in about 4 weeks (around 03/10/2015) for Recheck.

## 2015-02-10 NOTE — Assessment & Plan Note (Signed)
Dry mouth, chronic. Will send testing for Sjogrens today.

## 2015-02-10 NOTE — Assessment & Plan Note (Signed)
Dysphagia exacerbated by dry mouth. Question if she may also have recurrent esophageal narrowing. Will set up GI evaluation for possible endoscopy. Will also send lab evaluation for Sjogrens.

## 2015-02-10 NOTE — Patient Instructions (Addendum)
We will send labs today to evaluate for Sjogren's Syndrome.  We will set up evaluation with Dr. Hilarie Fredrickson.  Increase Victoza to 1.2mg  daily.  Follow up 4 weeks.

## 2015-02-10 NOTE — Assessment & Plan Note (Signed)
Wt Readings from Last 3 Encounters:  02/10/15 176 lb (79.833 kg)  01/25/15 179 lb 6 oz (81.364 kg)  07/08/14 176 lb (79.833 kg)   Congratulated pt on weight loss. Continue Victoza.

## 2015-02-10 NOTE — Assessment & Plan Note (Signed)
Tolerating Victoza well. Will increase dosing to 1.2mg  daily. Follow up in 4 weeks.

## 2015-02-10 NOTE — Progress Notes (Signed)
Pre visit review using our clinic review tool, if applicable. No additional management support is needed unless otherwise documented below in the visit note. 

## 2015-02-11 LAB — SJOGRENS SYNDROME-A EXTRACTABLE NUCLEAR ANTIBODY

## 2015-02-11 LAB — ANA: Anti Nuclear Antibody(ANA): NEGATIVE

## 2015-02-11 LAB — SJOGRENS SYNDROME-B EXTRACTABLE NUCLEAR ANTIBODY: ENA SSB (LA) Ab: <0.2 AI (ref 0.0–0.9)

## 2015-02-26 ENCOUNTER — Ambulatory Visit: Payer: 59 | Admitting: Internal Medicine

## 2015-03-05 ENCOUNTER — Ambulatory Visit (INDEPENDENT_AMBULATORY_CARE_PROVIDER_SITE_OTHER): Payer: 59 | Admitting: Family Medicine

## 2015-03-05 ENCOUNTER — Encounter: Payer: Self-pay | Admitting: Family Medicine

## 2015-03-05 DIAGNOSIS — M542 Cervicalgia: Secondary | ICD-10-CM | POA: Diagnosis not present

## 2015-03-05 DIAGNOSIS — M549 Dorsalgia, unspecified: Secondary | ICD-10-CM | POA: Diagnosis not present

## 2015-03-05 MED ORDER — MELOXICAM 15 MG PO TABS: 15.0000 mg | ORAL_TABLET | Freq: Every day | ORAL | Status: DC

## 2015-03-05 MED ORDER — CYCLOBENZAPRINE HCL 10 MG PO TABS: 10.0000 mg | ORAL_TABLET | Freq: Three times a day (TID) | ORAL | Status: DC | PRN

## 2015-03-05 MED ORDER — HYDROCODONE-ACETAMINOPHEN 5-325 MG PO TABS: 1.0000 | ORAL_TABLET | Freq: Four times a day (QID) | ORAL | Status: DC | PRN
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
## 2015-03-05 NOTE — Patient Instructions (Signed)
Use the medication as needed for pain/discomfort.  Take it easy and slowly return to your normal activities.  If you worsen, please let us know.  Take care  Dr. Lacinda Axon

## 2015-03-05 NOTE — Assessment & Plan Note (Signed)
Neck and back pain today.  No red flags on exam. No indication for imaging. Treating with Mobic, Flexeril and PRN Vicodin.

## 2015-03-08 ENCOUNTER — Ambulatory Visit: Payer: 59 | Admitting: Nurse Practitioner

## 2015-03-08 ENCOUNTER — Encounter: Payer: Self-pay | Admitting: *Deleted

## 2015-03-10 ENCOUNTER — Ambulatory Visit: Payer: 59 | Admitting: Internal Medicine

## 2015-04-08 ENCOUNTER — Ambulatory Visit (INDEPENDENT_AMBULATORY_CARE_PROVIDER_SITE_OTHER): Payer: 59 | Admitting: Internal Medicine

## 2015-04-08 ENCOUNTER — Encounter: Payer: Self-pay | Admitting: Internal Medicine

## 2015-04-08 VITALS — BP 150/80 | HR 80 | Ht 63.5 in | Wt 170.0 lb

## 2015-04-08 DIAGNOSIS — R059 Cough, unspecified: Secondary | ICD-10-CM

## 2015-04-08 DIAGNOSIS — R1310 Dysphagia, unspecified: Secondary | ICD-10-CM | POA: Diagnosis not present

## 2015-04-08 DIAGNOSIS — R05 Cough: Secondary | ICD-10-CM

## 2015-04-08 DIAGNOSIS — R682 Dry mouth, unspecified: Secondary | ICD-10-CM | POA: Diagnosis not present

## 2015-04-08 MED ORDER — OMEPRAZOLE 20 MG PO CPDR
20.0000 mg | DELAYED_RELEASE_CAPSULE | Freq: Two times a day (BID) | ORAL | Status: DC
Start: 2015-04-08 — End: 2015-05-06

## 2015-04-08 NOTE — Patient Instructions (Addendum)
You have been scheduled for an endoscopy. Please follow written instructions given to you at your visit today. If you use inhalers (even only as needed), please bring them with you on the day of your procedure. Your physician has requested that you go to www.startemmi.com and enter the access code given to you at your visit today. This web site gives a general overview about your procedure. However, you should still follow specific instructions given to you by our office regarding your preparation for the procedure.  We have sent the following medications to your pharmacy for you to pick up at your convenience: Omeprazole 20 mg twice daily  You have been scheduled for a Barium Esophogram at [facility] (1st floor of the hospital) on 04/19/15 at 11:30 am. Please arrive 15 minutes prior to your appointment for registration. Make certain not to have anything to eat or drink 6 hours prior to your test. If you need to reschedule for any reason, please contact radiology at [phone] to do so. __________________________________________________________________ A barium swallow is an examination that concentrates on views of the esophagus. This tends to be a double contrast exam (barium and two liquids which, when combined, create a gas to distend the wall of the oesophagus) or single contrast (non-ionic iodine based). The study is usually tailored to your symptoms so a good history is essential. Attention is paid during the study to the form, structure and configuration of the esophagus, looking for functional disorders (such as aspiration, dysphagia, achalasia, motility and reflux) EXAMINATION You may be asked to change into a gown, depending on the type of swallow being performed. A radiologist and radiographer will perform the procedure. The radiologist will advise you of the type of contrast selected for your procedure and direct you during the exam. You will be asked to stand, sit or lie in  several different positions and to hold a small amount of fluid in your mouth before being asked to swallow while the imaging is performed .In some instances you may be asked to swallow barium coated marshmallows to assess the motility of a solid food bolus. The exam can be recorded as a digital or video fluoroscopy procedure. POST PROCEDURE It will take 1-2 days for the barium to pass through your system. To facilitate this, it is important, unless otherwise directed, to increase your fluids for the next 24-48hrs and to resume your normal diet.  This test typically takes about 30 minutes to perform. __________________________________________________________________________________

## 2015-04-08 NOTE — Progress Notes (Signed)
     04/08/2015 Whitney Knight [id number] [birth year]   History of Present Illness:  This is a 64 year old female who is previously known to Dr. Sharlett Iles. She is known to him for colonoscopy as well as having performed an upper endoscopy in August 2014 for upper GI complaints, particularly dysphasia. Her EGD in August 2014 showed only mild antral gastropathy (Hpylori negative), but was otherwise normal. Due to her complaints of dysphagia her esophagus was dilated empirically due to thoughts that she may have possible occult stricture, but was noted to consider esophageal manometry if her dysphagia persisted.  She presents to our office again today with complaints of recurrent dysphagia to certain solid foods and occasionally pills. No liquid dysphagia. Her dysphagia occurs mostly with bread, peanut butter, steak, and other dry/bulkier foods. Started again in July and continues to worsen.  She tells me that this dysphasia is the exact same as it was previously prior to her last EGD with dilation. She had complete resolution of her dysphagia symptoms for about a year and half before this dysphasia returned. She also complains of a persistent dry cough for which she has been evaluated by pulmonary. She is on omeprazole 20 mg daily for the past couple years. She tells me that she recently tried discontinuing the medication, but then her cough got worse. She takes her omeprazole in the evenings and does not have any coughing episodes during the night. She also complains of extremely dry mouth and was evaluated for Sjogren's, which was negative. She says that she constantly has to have water to sip on and sucks on a lot of hard candy.  Current Medications, Allergies, Past Medical History, Past Surgical History, Family History and Social History were reviewed in [employer] record.   Physical Exam: BP 150/80 mmHg  Pulse 80  Ht 5' 3.5" (1.613 m)  Wt 170 lb (77.111 kg)  BMI 29.64  kg/m2 General: Well developed white female in no acute distress Head: Normocephalic and atraumatic Eyes:  Sclerae anicteric, conjunctiva pink  Ears: Normal auditory acuity Lungs: Clear throughout to auscultation Heart: Regular rate and rhythm Abdomen: Soft, non-distended.  Normal bowel sounds.  Non-tender. Musculoskeletal: Symmetrical with no gross deformities  Extremities: No edema  Neurological: Alert oriented x 4, grossly non-focal Psychological:  Alert and cooperative. Normal mood and affect  Assessment and Recommendations: 64 year old female with past medical history of esophageal dysphagia with response to dilation in 2014 seen for recurrent dysphagia.  1. Esophageal dysphagia -- cannot exclude esophagitis. She is having cough primarily in the day but not at night, which may be due to uncontrolled reflux. Increase omeprazole to 20 mg twice a day before meals. Barium swallow with tablet recommended to evaluate esophageal dysmotility and for occult stricture. Given response to dilation in the past, her interest in repeat dilation, I have scheduled her for upper endoscopy for dilation. We discussed the risks, benefits and alternatives and she is agreeable to proceed.  2. CRC screening -- up-to-date with colorectal cancer screening with colonoscopy in 2014, repeat in 10 years

## 2015-04-12 ENCOUNTER — Other Ambulatory Visit: Payer: Self-pay | Admitting: Internal Medicine

## 2015-04-12 ENCOUNTER — Encounter: Payer: Self-pay | Admitting: *Deleted

## 2015-04-13 ENCOUNTER — Telehealth: Payer: Self-pay | Admitting: *Deleted

## 2015-04-13 NOTE — Telephone Encounter (Signed)
[employer] has approved patient's omeprazole 20 mg twice daily for 10 years, through 04/11/25. PA # [id number]. Left message advising patient.

## 2015-04-19 ENCOUNTER — Ambulatory Visit (HOSPITAL_COMMUNITY)
Admission: RE | Admit: 2015-04-19 | Discharge: 2015-04-19 | Disposition: A | Discharge status: Home / Self Care | Payer: 59 | Source: Ambulatory Visit | Attending: Internal Medicine | Admitting: Internal Medicine

## 2015-04-19 DIAGNOSIS — Z981 Arthrodesis status: Secondary | ICD-10-CM | POA: Insufficient documentation

## 2015-04-19 DIAGNOSIS — R1310 Dysphagia, unspecified: Secondary | ICD-10-CM | POA: Diagnosis not present

## 2015-04-19 DIAGNOSIS — M2578 Osteophyte, vertebrae: Secondary | ICD-10-CM | POA: Insufficient documentation

## 2015-04-19 DIAGNOSIS — K219 Gastro-esophageal reflux disease without esophagitis: Secondary | ICD-10-CM | POA: Diagnosis not present

## 2015-04-28 ENCOUNTER — Ambulatory Visit (INDEPENDENT_AMBULATORY_CARE_PROVIDER_SITE_OTHER): Payer: 59 | Admitting: Family Medicine

## 2015-04-28 ENCOUNTER — Encounter: Payer: Self-pay | Admitting: Internal Medicine

## 2015-04-28 ENCOUNTER — Encounter: Payer: Self-pay | Admitting: Family Medicine

## 2015-04-28 VITALS — BP 120/68 | HR 81 | Temp 97.8°F | Ht 63.5 in | Wt 162.4 lb

## 2015-04-28 DIAGNOSIS — R05 Cough: Secondary | ICD-10-CM | POA: Diagnosis not present

## 2015-04-28 DIAGNOSIS — R059 Cough, unspecified: Secondary | ICD-10-CM

## 2015-04-28 MED ORDER — DOXYCYCLINE HYCLATE 100 MG PO TABS
100.0000 mg | ORAL_TABLET | Freq: Two times a day (BID) | ORAL | Status: DC
Start: 2015-04-28 — End: 2015-05-07

## 2015-04-28 MED ORDER — HYDROCOD POLST-CPM POLST ER 10-8 MG/5ML PO SUER: 5.0000 mL | Freq: Two times a day (BID) | ORAL | Status: DC | PRN

## 2015-04-28 NOTE — Patient Instructions (Signed)
Take the antibiotic as prescribed.  Use the cough medicine as needed.  Follow up closely with Dr. Gilford Rile.  Take care  Dr. Lacinda Axon

## 2015-04-28 NOTE — Assessment & Plan Note (Signed)
Patient with chronic cough with acute exacerbation. He's had a thorough workup which has been negative. She's had a recent barium swallow which was negative. She has not coming EGD. Given the acuity of her symptoms as well as associated shortness of breath, I'm treating her empirically with doxycycline. Tussionex for cough.

## 2015-04-28 NOTE — Progress Notes (Signed)
Pre visit review using our clinic review tool, if applicable. No additional management support is needed unless otherwise documented below in the visit note. 

## 2015-04-28 NOTE — Progress Notes (Signed)
Subjective:  Patient ID: Whitney Knight, female    DOB: 1950/06/04  Age: 64 y.o. MRN: [id number]  CC: Cough  HPI:  64 year old female with a history of chronic cough presents with complaints of worsening cough.  Patient states that this morning she began to cough up discolored sputum. Additionally she's had some green nasal mucus. She reports associated shortness of breath. No reports of fever. No exacerbating or relieving factors. No interventions tried.  Social Hx   Social History   Social History  . Marital Status: Married    Spouse Name: N/A  . Number of Children: 2  . Years of Education: N/A   Occupational History  . [employer]    Social History Main Topics  . Smoking status: Never Smoker   . Smokeless tobacco: Never Used  . Alcohol Use: Yes     Comment: occ  . Drug Use: No  . Sexual Activity: Not Asked   Other Topics Concern  . None   Social History Narrative   Lives with husband. No pets, 2 children.      Work - [employer] - regular   Exercise - none presently   Review of Systems  Constitutional: Negative for fever.  Respiratory: Positive for cough and shortness of breath.    Objective:  BP 120/68 mmHg  Pulse 81  Temp(Src) 97.8 F (36.6 C) (Oral)  Ht 5' 3.5" (1.613 m)  Wt 162 lb 6 oz (73.653 kg)  BMI 28.31 kg/m2  SpO2 95%  BP/Weight 04/28/2015 04/08/2015 0000000  Systolic BP 123456 Q000111Q Q000111Q  Diastolic BP 68 80 86  Wt. (Lbs) 162.38 170 175.5  BMI 28.31 29.64 30.6   Physical Exam  Constitutional: She appears well-developed. No distress.  HENT:  Head: Normocephalic and atraumatic.  Right Ear: External ear normal.  Left Ear: External ear normal.  Mouth/Throat: Oropharynx is clear and moist. No oropharyngeal exudate.  Normal TMs bilaterally.  Eyes: Conjunctivae are normal.  Neck: Neck supple.  Cardiovascular: Normal rate and regular rhythm.   No murmur heard. Pulmonary/Chest: Effort normal and breath sounds normal.    Lymphadenopathy:    She has no cervical adenopathy.  Neurological: She is alert.  Psychiatric: She has a normal mood and affect.  Vitals reviewed.  Lab Results  Component Value Date   WBC 5.9 05/13/2012   HGB 13.5 05/13/2012   HCT 40.7 05/13/2012   PLT 190 05/13/2012   GLUCOSE 106* 06/12/2014   CHOL 168 12/23/2014   TRIG 94 12/23/2014   HDL 54 12/23/2014   LDLCALC 95 12/23/2014   ALT 44* 06/12/2014   AST 48* 06/12/2014   NA 139 06/12/2014   K 4.0 06/12/2014   CL 99 06/12/2014   CREATININE 0.9 12/23/2014   BUN 16 06/12/2014   CO2 26 06/12/2014   TSH 1.940 05/13/2012   HGBA1C 8.6* 12/23/2014   Assessment & Plan:   Problem List Items Addressed This Visit    Cough - Primary    Patient with chronic cough with acute exacerbation. He's had a thorough workup which has been negative. She's had a recent barium swallow which was negative. She has not coming EGD. Given the acuity of her symptoms as well as associated shortness of breath, I'm treating her empirically with doxycycline. Tussionex for cough.        Meds ordered this encounter  Medications  . doxycycline (VIBRA-TABS) 100 MG tablet    Sig: Take 1 tablet (100 mg total) by  mouth 2 (two) times daily with a meal.    Dispense:  14 tablet    Refill:  0  . chlorpheniramine-HYDROcodone (TUSSIONEX PENNKINETIC ER) 10-8 MG/5ML SUER    Sig: Take 5 mLs by mouth every 12 (twelve) hours as needed.    Dispense:  115 mL    Refill:  0    Follow-up: PRN  [place]

## 2015-05-04 ENCOUNTER — Encounter: Payer: Self-pay | Admitting: Internal Medicine

## 2015-05-06 ENCOUNTER — Telehealth: Payer: Self-pay | Admitting: Internal Medicine

## 2015-05-06 ENCOUNTER — Other Ambulatory Visit: Payer: Self-pay

## 2015-05-06 MED ORDER — OMEPRAZOLE 20 MG PO CPDR
20.0000 mg | DELAYED_RELEASE_CAPSULE | Freq: Two times a day (BID) | ORAL | Status: DC
Start: 2015-05-06 — End: 2016-04-07

## 2015-05-06 MED ORDER — ONDANSETRON HCL 8 MG PO TABS: 8.0000 mg | ORAL_TABLET | Freq: Three times a day (TID) | ORAL | Status: DC | PRN

## 2015-05-06 NOTE — Telephone Encounter (Signed)
We can start Ondansetron 8mg  po tid prn #30. She may have viral GI infection that has been rampant causing nausea. Doxycyline can also cause nausea.

## 2015-05-06 NOTE — Telephone Encounter (Signed)
Pt called about her medication doxycycline (VIBRA-TABS) 100 MG tablet she was told to stop taking this medication. Pt is not sure if it's the Liraglutide (VICTOZA) 18 MG/3ML SOPN. Pt states that at around 3,5 and 11:30p anything she eats or drink the liquid comes back up. Pt has an appt for tomorrow morning,but wanted to know was there anything else she can do? Thank You!

## 2015-05-06 NOTE — Telephone Encounter (Signed)
Spoke with the patient.  She has been on Victoza for about three months now and it normally makes her nauseated in a small amount in the mornings, but is manageable.  Last week she was congested and had a sinus infection.  Came to see Dr. Lacinda Axon and was prescribed Doxycycline.  Patient yesterday was so nauseated that she called her GI doctor as she has a Endoscopy coming up on the 17th and wasn't sure what was making her sick.  The GI doctor told her to stop the Doxycycline.  Her Sinus/Congestion symptoms have already resolved.  They advised her to follow up with you.  She is scheduled for tomorrow Morning.  Concerned though that her nausea has increased still.  Even the smell of food is making it worse.  Ate small amount of cheese and bread last night with some peanut butter and within a 44minute time was throwing up liquids but smelled like peanut butter.  Today has only had a protein shake but been extremely nauseated.  GI doctor did increase her omperazale to twice a day last month also.  Please advise if you would like her to do anything prior to tomorrow's visit.  thanks

## 2015-05-06 NOTE — Telephone Encounter (Signed)
Spoke with patient, she will pick up the medications.

## 2015-05-07 ENCOUNTER — Other Ambulatory Visit: Payer: Self-pay | Admitting: Internal Medicine

## 2015-05-07 ENCOUNTER — Encounter: Payer: Self-pay | Admitting: Internal Medicine

## 2015-05-07 ENCOUNTER — Ambulatory Visit (INDEPENDENT_AMBULATORY_CARE_PROVIDER_SITE_OTHER): Payer: 59 | Admitting: Internal Medicine

## 2015-05-07 VITALS — BP 114/73 | HR 80 | Temp 97.9°F | Ht 63.5 in | Wt 158.0 lb

## 2015-05-07 DIAGNOSIS — IMO0001 Reserved for inherently not codable concepts without codable children: Secondary | ICD-10-CM

## 2015-05-07 DIAGNOSIS — R112 Nausea with vomiting, unspecified: Secondary | ICD-10-CM | POA: Diagnosis not present

## 2015-05-07 DIAGNOSIS — E1165 Type 2 diabetes mellitus with hyperglycemia: Secondary | ICD-10-CM | POA: Diagnosis not present

## 2015-05-07 NOTE — Assessment & Plan Note (Signed)
Nausea likely a side effect of Victoza, exacerbated by recent Doxycyline use. Will stop Victoza. Continue Ondansetron and Omeprazole. Continue to push fluid intake. Follow up with GI as scheduled.

## 2015-05-07 NOTE — Assessment & Plan Note (Signed)
BG have been well controlled on Victoza, however unable to tolerate. Will stop medication. Continue Metformin.Follow up with A1c in 4 weeks.

## 2015-05-07 NOTE — Progress Notes (Signed)
Pre visit review using our clinic review tool, if applicable. No additional management support is needed unless otherwise documented below in the visit note. 

## 2015-05-07 NOTE — Progress Notes (Signed)
Subjective:    Patient ID: Whitney Knight, female    DOB: [birth year], 65 y.o.   MRN: [id number]  HPI  65YO female presents for acute visit.  Noted some decreased appetite with Victoza at 1.2mg  dosing. Had swallow study on 12/19 which showed reflux. Had some abdominal pain after this. Then developed URI with cough productive of white mucous. Started on Doxycycline. Tolerated well first few days, then developed worsening nausea. Called GI doctor who recommended to stop medication. Nausea persisted. Very nauseous during morning. Has been working from home. Nausea now present all day. Some vomiting of clear fluid. Took Zofran last night with some improvement. Tolerated yogurt last night and cereal. Feels better this morning. No abdominal pain. No fever, chills. No constipation or diarrhea.   Wt Readings from Last 3 Encounters:  05/07/15 158 lb (71.668 kg)  04/28/15 162 lb 6 oz (73.653 kg)  04/08/15 170 lb (77.111 kg)   BP Readings from Last 3 Encounters:  05/07/15 114/73  04/28/15 120/68  04/08/15 150/80    Past Medical History  Diagnosis Date  . Hypertension   . Diabetes mellitus   . Thyroid disease   . Chronic sinusitis   . Headache(784.0)     [facility] in past  . Diverticulosis   . Cough    Family History  Problem Relation Age of Onset  . COPD Father   . Hypertension Father   . Heart disease Father   . Heart disease Mother   . Diabetes Maternal Grandmother   . Colon cancer Neg Hx    Past Surgical History  Procedure Laterality Date  . Nasal sinus surgery  06/2008  . Vaginal delivery      2  . Dental surgery    . Dental implants    . Bone graft      for dental surgery  . Colonoscopy  2014  . Carpal tunnel release Right 02/27/2013    [facility]  . Trigger finger release Right 02/27/2013    [facility]   Social History   Social History  . Marital Status: Married    Spouse Name: N/A  . Number of Children: 2  . Years of Education: N/A   Occupational  History  . [employer]    Social History Main Topics  . Smoking status: Never Smoker   . Smokeless tobacco: Never Used  . Alcohol Use: Yes     Comment: occ  . Drug Use: No  . Sexual Activity: Not Asked   Other Topics Concern  . None   Social History Narrative   Lives with husband. No pets, 2 children.      Work - [employer] - regular   Exercise - none presently    Review of Systems  Constitutional: Negative for fever, chills, appetite change, fatigue and unexpected weight change.  HENT: Negative for congestion, rhinorrhea, sinus pressure, sneezing, sore throat and trouble swallowing.   Eyes: Negative for visual disturbance.  Respiratory: Positive for cough (occasional). Negative for shortness of breath.   Cardiovascular: Negative for chest pain and leg swelling.  Gastrointestinal: Positive for nausea and vomiting. Negative for abdominal pain, diarrhea, constipation and abdominal distention.  Skin: Negative for color change and rash.  Hematological: Negative for adenopathy. Does not bruise/bleed easily.  Psychiatric/Behavioral: Negative for sleep disturbance and dysphoric mood. The patient is not nervous/anxious.        Objective:    BP 114/73 mmHg  Pulse 80  Temp(Src) 97.9  F (36.6 C) (Oral)  Ht 5' 3.5" (1.613 m)  Wt 158 lb (71.668 kg)  BMI 27.55 kg/m2  SpO2 100% Physical Exam  Constitutional: She is oriented to person, place, and time. She appears well-developed and well-nourished. No distress.  HENT:  Head: Normocephalic and atraumatic.  Right Ear: External ear normal.  Left Ear: External ear normal.  Nose: Nose normal.  Mouth/Throat: Oropharynx is clear and moist. No oropharyngeal exudate.  Eyes: Conjunctivae are normal. Pupils are equal, round, and reactive to light. Right eye exhibits no discharge. Left eye exhibits no discharge. No scleral icterus.  Neck: Normal range of motion. Neck supple. No tracheal deviation present. No thyromegaly  present.  Cardiovascular: Normal rate, regular rhythm, normal heart sounds and intact distal pulses.  Exam reveals no gallop and no friction rub.   No murmur heard. Pulmonary/Chest: Effort normal and breath sounds normal. No respiratory distress. She has no wheezes. She has no rales. She exhibits no tenderness.  Abdominal: Soft. She exhibits no distension.  Musculoskeletal: Normal range of motion. She exhibits no edema or tenderness.  Lymphadenopathy:    She has no cervical adenopathy.  Neurological: She is alert and oriented to person, place, and time. No cranial nerve deficit. She exhibits normal muscle tone. Coordination normal.  Skin: Skin is warm and dry. No rash noted. She is not diaphoretic. No erythema. No pallor.  Psychiatric: She has a normal mood and affect. Her behavior is normal. Judgment and thought content normal.          Assessment & Plan:   Problem List Items Addressed This Visit      Unprioritized   Diabetes mellitus type 2, uncontrolled ([place])    BG have been well controlled on Victoza, however unable to tolerate. Will stop medication. Continue Metformin.Follow up with A1c in 4 weeks.      Nausea with vomiting - Primary    Nausea likely a side effect of Victoza, exacerbated by recent Doxycyline use. Will stop Victoza. Continue Ondansetron and Omeprazole. Continue to push fluid intake. Follow up with GI as scheduled.          Return in about 4 weeks (around 06/04/2015) for Recheck.

## 2015-05-07 NOTE — Patient Instructions (Addendum)
Stop Victoza.  Continue Ondansetron 8mg  three times daily.  Continue Omeprazole twice daily.  Continue to push fluid intake. Call or return to clinic if unable to tolerate liquids.  Follow up with GI for endoscopy as scheduled.

## 2015-05-18 ENCOUNTER — Encounter: Payer: Self-pay | Admitting: Internal Medicine

## 2015-05-18 ENCOUNTER — Ambulatory Visit (AMBULATORY_SURGERY_CENTER): Payer: 59 | Admitting: Internal Medicine

## 2015-05-18 VITALS — BP 135/90 | HR 68 | Temp 97.7°F | Resp 17 | Ht 63.5 in | Wt 158.0 lb

## 2015-05-18 DIAGNOSIS — K219 Gastro-esophageal reflux disease without esophagitis: Secondary | ICD-10-CM | POA: Diagnosis not present

## 2015-05-18 DIAGNOSIS — R1310 Dysphagia, unspecified: Secondary | ICD-10-CM | POA: Diagnosis present

## 2015-05-18 MED ORDER — SODIUM CHLORIDE 0.9 % IV SOLN: 500.0000 mL | INTRAVENOUS | Status: DC

## 2015-05-18 NOTE — Op Note (Signed)
[facility]  [address] [place], [address]   ENDOSCOPY PROCEDURE REPORT  PATIENT: Whitney Knight, Whitney Knight  MR#: [id number] BIRTHDATE: 10-May-1950 , 64  yrs. old GENDER: female ENDOSCOPIST: Jerene Bears, MD REFERRED BY:  Ronette Deter, M.D. PROCEDURE DATE:  05/18/2015 PROCEDURE:  EGD w/ wire guided (Savary) dilation ASA CLASS:     Class II INDICATIONS:  history of GERD, dysphagia, and chronic cough. MEDICATIONS: Monitored anesthesia care and Propofol 200 mg IV TOPICAL ANESTHETIC: none  DESCRIPTION OF PROCEDURE: After the risks benefits and alternatives of the procedure were thoroughly explained, informed consent was obtained.  The [id number] endoscope was introduced through the mouth and advanced to the second portion of the duodenum , Without limitations.  The instrument was slowly withdrawn as the mucosa was fully examined.   ESOPHAGUS: The mucosa of the esophagus appeared normal.  The stricture was dilated using a 60mm (51Fr) Savary dilator over guidewire.  STOMACH: There was a small amount of residual food seen in the gastric body.  Based on this, I suspect the patient has some level of gastroparesis.   The stomach otherwise appeared normal.  DUODENUM: The duodenal mucosa showed no abnormalities in the bulb and 2nd part of the duodenum.  Retroflexed views revealed no abnormalities.     The scope was then withdrawn from the patient and the procedure completed.  COMPLICATIONS: There were no immediate complications.  ENDOSCOPIC IMPRESSION: 1.   The mucosa of the esophagus appeared normal; empiric dilation with 62mm (51Fr) Savary dilator over guidewire 2.   Mild food residue in the gastric body suggesting an element of gastroparesis 3.   The stomach otherwise appeared normal 4.   The duodenal mucosa showed no abnormalities in the bulb and 2nd part of the duodenum  RECOMMENDATIONS: 1.  Continue omeprazole 20 mg twice daily 2.  Anti-reflux  regimen recommended 3.  Repeat dilation as needed  eSigned:  Jerene Bears, MD 05/18/2015 10:37 AM  CC: the patient, Dr. Gilford Rile

## 2015-05-18 NOTE — Progress Notes (Signed)
Patient awakening,vss,report to rn 

## 2015-05-18 NOTE — Progress Notes (Signed)
Called to room to assist during endoscopic procedure.  Patient ID and intended procedure confirmed with present staff. Received instructions for my participation in the procedure from the performing physician.  

## 2015-05-18 NOTE — Patient Instructions (Signed)
YOU HAD AN ENDOSCOPIC PROCEDURE TODAY AT [facility]:   Refer to the procedure report that was given to you for any specific questions about what was found during the examination.  If the procedure report does not answer your questions, please call your gastroenterologist to clarify.  If you requested that your care partner not be given the details of your procedure findings, then the procedure report has been included in a sealed envelope for you to review at your convenience later.  YOU SHOULD EXPECT: Some feelings of bloating in the abdomen. Passage of more gas than usual.  Walking can help get rid of the air that was put into your GI tract during the procedure and reduce the bloating.   Please Note:  You might notice some irritation and congestion in your nose or some drainage.  This is from the oxygen used during your procedure.  There is no need for concern and it should clear up in a day or so.  SYMPTOMS TO REPORT IMMEDIATELY:   Following upper endoscopy (EGD)  Vomiting of blood or coffee ground material  New chest pain or pain under the shoulder blades  Painful or persistently difficult swallowing  New shortness of breath  Fever of 100F or higher  Black, tarry-looking stools  For urgent or emergent issues, a gastroenterologist can be reached at any hour by calling [phone].   DIET: Clear liquids until 11:30 am today, then a soft diet for the rest of today.  You may have a regular diet tomorrow. Read the reflux diet. Your first meal following the procedure should be a small meal and then it is ok to progress to your normal diet. Heavy or fried foods are harder to digest and may make you feel nauseous or bloated.   Drink plenty of fluids but you should avoid alcoholic beverages for 24 hours.  ACTIVITY:  You should plan to take it easy for the rest of today and you should NOT DRIVE or use heavy machinery until tomorrow (because of the sedation medicines used during  the test).    FOLLOW UP: Our staff will call the number listed on your records the next business day following your procedure to check on you and address any questions or concerns that you may have regarding the information given to you following your procedure. If we do not reach you, we will leave a message.  However, if you are feeling well and you are not experiencing any problems, there is no need to return our call.  We will assume that you have returned to your regular daily activities without incident.  If any biopsies were taken you will be contacted by phone or by letter within the next 1-3 weeks.  Please call us at [phone] if you have not heard about the biopsies in 3 weeks.    SIGNATURES/CONFIDENTIALITY: You and/or your care partner have signed paperwork which will be entered into your electronic medical record.  These signatures attest to the fact that that the information above on your After Visit Summary has been reviewed and is understood.  Full responsibility of the confidentiality of this discharge information lies with you and/or your care-partner.  Continue your omeprazole.  Thank-you for choosing [place] for your healthcare needs.

## 2015-05-18 NOTE — Progress Notes (Signed)
Discharge instructions given by Riki Sheer, LPN; good feedback from the patient.

## 2015-05-19 ENCOUNTER — Telehealth: Payer: Self-pay | Admitting: Emergency Medicine

## 2015-05-19 NOTE — Telephone Encounter (Signed)
  Follow up Call-  Call back number 05/18/2015 11/29/2012  Post procedure Call Back phone  # [phone]  Permission to leave phone message Yes Yes     Patient questions:  Do you have a fever, pain , or abdominal swelling? No. Pain Score  0 *  Have you tolerated food without any problems? Yes.    Have you been able to return to your normal activities? Yes.    Do you have any questions about your discharge instructions: Diet   No. Medications  No. Follow up visit  No.  Do you have questions or concerns about your Care? No.  Actions: * If pain score is 4 or above: No action needed, pain <4.

## 2015-05-27 LAB — HM DIABETES EYE EXAM

## 2015-06-09 ENCOUNTER — Other Ambulatory Visit: Payer: Self-pay | Admitting: Internal Medicine

## 2015-06-10 ENCOUNTER — Encounter: Payer: Self-pay | Admitting: Internal Medicine

## 2015-06-10 DIAGNOSIS — Z780 Asymptomatic menopausal state: Secondary | ICD-10-CM

## 2015-06-10 LAB — COMPREHENSIVE METABOLIC PANEL
ALBUMIN: 4 g/dL
ALT: 32 IU/L
AST: 33 IU/L (ref 0–40)
Albumin/Globulin Ratio: 1.7 (ref 1.1–2.5)
Alkaline Phosphatase: 79 IU/L
BUN / CREAT RATIO: 11
BUN: 9 mg/dL
Bilirubin Total: 0.4 mg/dL (ref 0.0–1.2)
CO2: 26 mmol/L (ref 18–29)
CREATININE: 0.83 mg/dL
Calcium: 9.4 mg/dL
Chloride: 98 mmol/L (ref 96–106)
GLUCOSE: 115 mg/dL — AB (ref 65–99)
Globulin, Total: 2.4 g/dL (ref 1.5–4.5)
Potassium: 3.7 mmol/L (ref 3.5–5.2)
Sodium: 140 mmol/L (ref 134–144)
TOTAL PROTEIN: 6.4 g/dL (ref 6.0–8.5)

## 2015-06-10 LAB — LIPID PANEL W/O CHOL/HDL RATIO
Cholesterol, Total: 160 mg/dL
HDL: 50 mg/dL (ref >39)
LDL CALC: 91 mg/dL
Triglycerides: 96 mg/dL
VLDL CHOLESTEROL CAL: 19 mg/dL (ref 5–40)

## 2015-06-10 LAB — HGB A1C W/O EAG: HEMOGLOBIN A1C: 7.3 % — AB (ref 4.8–5.6)

## 2015-06-11 ENCOUNTER — Encounter: Payer: Self-pay | Admitting: Internal Medicine

## 2015-06-21 ENCOUNTER — Other Ambulatory Visit: Payer: Self-pay | Admitting: Internal Medicine

## 2015-06-22 ENCOUNTER — Other Ambulatory Visit: Payer: Self-pay | Admitting: Internal Medicine

## 2015-06-24 ENCOUNTER — Ambulatory Visit
Admission: RE | Admit: 2015-06-24 | Discharge: 2015-06-24 | Disposition: A | Discharge status: Home / Self Care | Payer: 59 | Source: Ambulatory Visit | Attending: Internal Medicine | Admitting: Internal Medicine

## 2015-06-24 DIAGNOSIS — Z13820 Encounter for screening for osteoporosis: Secondary | ICD-10-CM | POA: Insufficient documentation

## 2015-06-24 DIAGNOSIS — Z780 Asymptomatic menopausal state: Secondary | ICD-10-CM

## 2015-06-24 DIAGNOSIS — M810 Age-related osteoporosis without current pathological fracture: Secondary | ICD-10-CM | POA: Insufficient documentation

## 2015-06-25 ENCOUNTER — Telehealth: Payer: Self-pay | Admitting: *Deleted

## 2015-06-25 NOTE — Telephone Encounter (Signed)
Patient requested a my chart message to give results.

## 2015-06-25 NOTE — Telephone Encounter (Signed)
Sent her a Mychart message per her request.

## 2015-06-25 NOTE — Telephone Encounter (Signed)
Left a VM for patient to return my call.

## 2015-06-25 NOTE — Telephone Encounter (Signed)
Patient stated that she missed a call, about her bone density test. She will be at contact [phone]

## 2015-07-02 ENCOUNTER — Other Ambulatory Visit: Payer: Self-pay | Admitting: Internal Medicine

## 2015-07-02 NOTE — Telephone Encounter (Signed)
Please advise refill. Thanks Last fill was for 90 days and no refills.

## 2015-07-05 ENCOUNTER — Encounter: Payer: Self-pay | Admitting: *Deleted

## 2015-07-05 ENCOUNTER — Encounter: Payer: Self-pay | Admitting: Internal Medicine

## 2015-07-27 ENCOUNTER — Other Ambulatory Visit: Payer: Self-pay | Admitting: Internal Medicine

## 2015-08-07 ENCOUNTER — Other Ambulatory Visit: Payer: Self-pay | Admitting: Internal Medicine

## 2015-08-10 NOTE — Telephone Encounter (Signed)
Rx refill sent to pharmacy. 

## 2015-08-23 ENCOUNTER — Ambulatory Visit: Payer: 59 | Admitting: Family Medicine

## 2015-08-30 ENCOUNTER — Encounter: Payer: Self-pay | Admitting: Family Medicine

## 2015-08-30 ENCOUNTER — Ambulatory Visit (INDEPENDENT_AMBULATORY_CARE_PROVIDER_SITE_OTHER): Payer: 59 | Admitting: Family Medicine

## 2015-08-30 ENCOUNTER — Ambulatory Visit (INDEPENDENT_AMBULATORY_CARE_PROVIDER_SITE_OTHER)
Admission: RE | Admit: 2015-08-30 | Discharge: 2015-08-30 | Disposition: A | Discharge status: Home / Self Care | Payer: 59 | Source: Ambulatory Visit | Attending: Family Medicine | Admitting: Family Medicine

## 2015-08-30 ENCOUNTER — Other Ambulatory Visit: Payer: Self-pay

## 2015-08-30 VITALS — BP 118/76 | HR 78 | Wt 163.0 lb

## 2015-08-30 DIAGNOSIS — S83249A Other tear of medial meniscus, current injury, unspecified knee, initial encounter: Secondary | ICD-10-CM | POA: Insufficient documentation

## 2015-08-30 DIAGNOSIS — M25561 Pain in right knee: Secondary | ICD-10-CM

## 2015-08-30 DIAGNOSIS — S83241A Other tear of medial meniscus, current injury, right knee, initial encounter: Secondary | ICD-10-CM | POA: Diagnosis not present

## 2015-08-30 MED ORDER — IBUPROFEN-FAMOTIDINE 800-26.6 MG PO TABS: ORAL_TABLET | ORAL | Status: DC

## 2015-08-30 NOTE — Progress Notes (Addendum)
[name] [address], [place] [address] Phone: [phone] Subjective:    I'm seeing this patient by the request  of:  Rica Mast, MD   CC: right knee pain and swelling  [id number] Whitney Knight is a 65 y.o. female coming in with complaint of right knee pain and swelling. Patient is having severe pain. Had been working on a more regular basis. Felt when she twisted one time she had a bit pain. Now having a catching and almost locking sensation of the knee. Seems to be worsening over the course last several weeks. Sometimes seems to have some swelling. Continues to try to remain active social was weight. Patient denies any radiation down the leg. Rates the severity of pain though when it occurs a 7 out of 10. Followed by a dull aching sensation for 1-2 days. Not waking her up at night but sometimes uncomfortable. Has not tried any home modalities except for over-the-counter anti-inflammatories which she tries to avoid.     Past Medical History  Diagnosis Date  . Hypertension   . Diabetes mellitus   . Thyroid disease   . Chronic sinusitis   . Headache(784.0)     [facility] in past  . Diverticulosis   . Cough   . Allergy    Past Surgical History  Procedure Laterality Date  . Nasal sinus surgery  06/2008  . Vaginal delivery      2  . Dental surgery    . Dental implants    . Bone graft      for dental surgery  . Colonoscopy  2014  . Carpal tunnel release Right 02/27/2013    [facility]  . Trigger finger release Right 02/27/2013    [facility]   Social History   Social History  . Marital Status: Married    Spouse Name: N/A  . Number of Children: 2  . Years of Education: N/A   Occupational History  . [employer]    Social History Main Topics  . Smoking status: Never Smoker   . Smokeless tobacco: Never Used  . Alcohol Use: Yes     Comment: occ  . Drug Use: No  . Sexual Activity: Not on file    Other Topics Concern  . Not on file   Social History Narrative   Lives with husband. No pets, 2 children.      Work - [employer] - regular   Exercise - none presently   Allergies  Allergen Reactions  . Clarithromycin     REACTION: Nausea  . Penicillins     REACTION: Throat swelling   Family History  Problem Relation Age of Onset  . COPD Father   . Hypertension Father   . Heart disease Father   . Heart disease Mother   . Diabetes Maternal Grandmother     Past medical history, social, surgical and family history all reviewed in electronic medical record.  No pertanent information unless stated regarding to the chief complaint.   Review of Systems: No headache, visual changes, nausea, vomiting, diarrhea, constipation, dizziness, abdominal pain, skin rash, fevers, chills, night sweats, weight loss, swollen lymph nodes, body aches, joint swelling, muscle aches, chest pain, shortness of breath, mood changes.   Objective There were no vitals taken for this visit.  General: No apparent distress alert and oriented x3 mood and affect normal, dressed appropriately.  HEENT: Pupils equal, extraocular movements intact  Respiratory: Patient's  speak in full sentences and does not appear short of breath  Cardiovascular: No lower extremity edema, non tender, no erythema  Skin: Warm dry intact with no signs of infection or rash on extremities or on axial skeleton.  Abdomen: Soft nontender  Neuro: Cranial nerves II through XII are intact, neurovascularly intact in all extremities with 2+ DTRs and 2+ pulses.  Lymph: No lymphadenopathy of posterior or anterior cervical chain or axillae bilaterally.  Gait normal with good balance and coordination.  MSK:  Non tender with full range of motion and good stability and symmetric strength and tone of shoulders, elbows, wrist, hip, and ankles bilaterally.  Knee:right Normal to inspection with no erythema or effusion or obvious bony  abnormalities. Tender over the medial joint line ROM full in flexion and extension and lower leg rotation. Ligaments with solid consistent endpoints including ACL, PCL, LCL, MCL. positiveMcmurray's, Apley's, and Thessalonian tests. Non painful patellar compression. Patellar glide without crepitus. Patellar and quadriceps tendons unremarkable. Hamstring and quadriceps strength is normal.  Contralateral knee unremarkable   MSK US performed of: right knee This study was ordered, performed, and interpreted by Charlann Boxer D.O.  Knee: All structures visualized. Acute meniscal tear noted. Significant hypoechoic changes and what appears to be a large tear but no significant displacement. Patellar Tendon unremarkable on long and transverse views without effusion. No abnormality of prepatellar bursa. LCL and MCL unremarkable on long and transverse views. No abnormality of origin of medial or lateral head of the gastrocnemius.  IMPRESSION:  Acute medial meniscal tear with increasing Doppler flow and hypoechoic changes  After informed written and verbal consent, patient was seated on exam table. Right knee was prepped with alcohol swab and utilizing anterolateral approach, patient's right knee space was injected with 4:1  marcaine 0.5%: Kenalog 40mg /dL. Patient tolerated the procedure well without immediate complications.  Procedure note [id number]; 15 minutes spent for Therapeutic exercises as stated in above notes.  This included exercises focusing on stretching, strengthening, with significant focus on eccentric aspects.  Vastus medialis oblique strengthening as well as hip abductor strengthening given. Avoid any twisting motion. Proper technique shown and discussed handout in great detail with ATC.  All questions were discussed and answered.     Impression and Recommendations:     This case required medical decision making of moderate complexity.      Note: This dictation was prepared with  Dragon dictation along with smaller phrase technology. Any transcriptional errors that result from this process are unintentional.

## 2015-08-30 NOTE — Patient Instructions (Signed)
Good to see you  Duexis 3 times a day for 6 days then as needed. They will mail it to you.  Ice 20 minutes 2 times daily. Usually after activity and before bed. Avoid any twisting or deep squats, that means dancing.  We injected the knee today  Xray downstairs today  If not better after the beach call and we will get MRI Otherwise see me again in 2-3 weeks.

## 2015-08-30 NOTE — Assessment & Plan Note (Signed)
Patient does have a large tear of the medial meniscus. I'm hoping that patient does heal. Patient continues to have some intra-articular difficulty I would consider advanced imaging. Injection given today to try to decrease any inflammation neck and be contribute in. Sent to formal physical therapy. X-rays pending. Follow up in 4 weeks.

## 2015-09-02 ENCOUNTER — Encounter: Payer: Self-pay | Admitting: Family Medicine

## 2015-09-02 DIAGNOSIS — S83241A Other tear of medial meniscus, current injury, right knee, initial encounter: Secondary | ICD-10-CM

## 2015-09-10 ENCOUNTER — Ambulatory Visit
Admission: RE | Admit: 2015-09-10 | Discharge: 2015-09-10 | Disposition: A | Discharge status: Home / Self Care | Payer: 59 | Source: Ambulatory Visit | Attending: Family Medicine | Admitting: Family Medicine

## 2015-09-10 ENCOUNTER — Encounter: Payer: Self-pay | Admitting: Family Medicine

## 2015-09-10 DIAGNOSIS — S83241A Other tear of medial meniscus, current injury, right knee, initial encounter: Secondary | ICD-10-CM

## 2015-09-13 ENCOUNTER — Encounter: Payer: Self-pay | Admitting: Family Medicine

## 2015-09-15 ENCOUNTER — Telehealth: Payer: Self-pay | Admitting: Internal Medicine

## 2015-09-15 ENCOUNTER — Encounter: Payer: Self-pay | Admitting: Family Medicine

## 2015-09-15 ENCOUNTER — Ambulatory Visit (INDEPENDENT_AMBULATORY_CARE_PROVIDER_SITE_OTHER): Payer: 59 | Admitting: Family Medicine

## 2015-09-15 VITALS — BP 112/72 | HR 70 | Ht 63.5 in | Wt 162.0 lb

## 2015-09-15 DIAGNOSIS — M2241 Chondromalacia patellae, right knee: Secondary | ICD-10-CM | POA: Diagnosis not present

## 2015-09-15 DIAGNOSIS — M1711 Unilateral primary osteoarthritis, right knee: Secondary | ICD-10-CM

## 2015-09-15 DIAGNOSIS — S83241A Other tear of medial meniscus, current injury, right knee, initial encounter: Secondary | ICD-10-CM

## 2015-09-15 DIAGNOSIS — S83241D Other tear of medial meniscus, current injury, right knee, subsequent encounter: Secondary | ICD-10-CM | POA: Diagnosis not present

## 2015-09-15 NOTE — Telephone Encounter (Signed)
Called [employer], spoke to [employer]. The pt does not require a prior-auth as long as the Synvisc injection is billed with an OV, & it is.  lmovm for pt to return call to make aware.

## 2015-09-15 NOTE — Telephone Encounter (Signed)
Pt called in and said something about she is needing urgent PA for her injections.  She was asking to speak with you. Can you call her when you get a chance   [phone] -for a urgent matter and do a rush.  (this is the number to call to do the rush PA)    [phone] pt cell number

## 2015-09-15 NOTE — Telephone Encounter (Signed)
Discussed with pt

## 2015-09-15 NOTE — Patient Instructions (Signed)
Good to see you You should be fine if you have met your deductible  Otherwise call insurance  Code for medicine is J7325 Diagnosis code  M17.0 PT at [facility] and they will call you  Ice 20 minutes 2 times daily. Usually after activity and before bed. We will get you a brace next week or at worst case before [facility].  See you again next week.

## 2015-09-15 NOTE — Progress Notes (Signed)
[name] [address], [place] [address] Phone: [phone] Subjective:    I'm seeing this patient by the request  of:  Rica Mast, MD   CC: right knee pain and swelling f/u  [id number] Whitney Knight is a 65 y.o. female coming in with complaint of right knee pain and swelling. Found to have OA of the knee on MRI grade 4 chondromalacia.  Inability visualized by me. Patient was given a steroid injection with minimal improvement. Patient has been doing bracing with minimal improvement. Not started formal physical therapy. Wondering also she has can do at this time. He is worsening. Affecting daily activities. Once to avoid surgery at all cost.     Past Medical History  Diagnosis Date  . Hypertension   . Diabetes mellitus   . Thyroid disease   . Chronic sinusitis   . Headache(784.0)     [facility] in past  . Diverticulosis   . Cough   . Allergy    Past Surgical History  Procedure Laterality Date  . Nasal sinus surgery  06/2008  . Vaginal delivery      2  . Dental surgery    . Dental implants    . Bone graft      for dental surgery  . Colonoscopy  2014  . Carpal tunnel release Right 02/27/2013    [facility]  . Trigger finger release Right 02/27/2013    [facility]   Social History   Social History  . Marital Status: Married    Spouse Name: N/A  . Number of Children: 2  . Years of Education: N/A   Occupational History  . [employer]    Social History Main Topics  . Smoking status: Never Smoker   . Smokeless tobacco: Never Used  . Alcohol Use: Yes     Comment: occ  . Drug Use: No  . Sexual Activity: Not Asked   Other Topics Concern  . None   Social History Narrative   Lives with husband. No pets, 2 children.      Work - [employer] - regular   Exercise - none presently   Allergies  Allergen Reactions  . Clarithromycin     REACTION: Nausea  . Penicillins     REACTION: Throat  swelling   Family History  Problem Relation Age of Onset  . COPD Father   . Hypertension Father   . Heart disease Father   . Heart disease Mother   . Diabetes Maternal Grandmother     Past medical history, social, surgical and family history all reviewed in electronic medical record.  No pertanent information unless stated regarding to the chief complaint.   Review of Systems: No headache, visual changes, nausea, vomiting, diarrhea, constipation, dizziness, abdominal pain, skin rash, fevers, chills, night sweats, weight loss, swollen lymph nodes, body aches, joint swelling, muscle aches, chest pain, shortness of breath, mood changes.   Objective Blood pressure 112/72, pulse 70, height 5' 3.5" (1.613 m), weight 162 lb (73.483 kg), SpO2 99 %.  General: No apparent distress alert and oriented x3 mood and affect normal, dressed appropriately.  HEENT: Pupils equal, extraocular movements intact  Respiratory: Patient's speak in full sentences and does not appear short of breath  Cardiovascular: No lower extremity edema, non tender, no erythema  Skin: Warm dry intact with no signs of infection or rash on extremities or on axial skeleton.  Abdomen: Soft nontender  Neuro:  Cranial nerves II through XII are intact, neurovascularly intact in all extremities with 2+ DTRs and 2+ pulses.  Lymph: No lymphadenopathy of posterior or anterior cervical chain or axillae bilaterally.  Gait normal with good balance and coordination.  MSK:  Non tender with full range of motion and good stability and symmetric strength and tone of shoulders, elbows, wrist, hip, and ankles bilaterally.  Knee:right Normal to inspection with no erythema or effusion or obvious bony abnormalities. Tender over the medial joint line ROM full in flexion and extension and lower leg rotation. Ligaments with solid consistent endpoints including ACL, PCL, LCL, MCL. Positive Mcmurray's, Apley's, and Thessalonian tests. painful patellar  compression. Patellar glide with moderate crepitus. Patellar and quadriceps tendons unremarkable. Hamstring and quadriceps strength is normal.  Contralateral knee unremarkable     After informed written and verbal consent, patient was seated on exam table. Right knee was prepped with alcohol swab and utilizing anterolateral approach, patient's right knee space was injected with 16 mg/2.5 mL of Synvisc (sodium hyaluronate) in a prefilled syringe was injected easily into the knee through a 22-gauge needle.. Patient tolerated the procedure well without immediate complications.      Impression and Recommendations:     This case required medical decision making of moderate complexity.      Note: This dictation was prepared with Dragon dictation along with smaller phrase technology. Any transcriptional errors that result from this process are unintentional.

## 2015-09-15 NOTE — Assessment & Plan Note (Signed)
Patient given first in series of 3 injections. We'll start formal physical therapy. Discussed icing regimen. Patient will continue conservative therapy. We will give patient a brace at next follow-up. Patient come back and see me in 1 week for second in a series of 3 injections.  Spent  25 minutes with patient face-to-face and had greater than 50% of counseling including as described above in assessment and plan.

## 2015-09-15 NOTE — Progress Notes (Signed)
Pre visit review using our clinic review tool, if applicable. No additional management support is needed unless otherwise documented below in the visit note. 

## 2015-09-17 ENCOUNTER — Encounter: Payer: Self-pay | Admitting: Family Medicine

## 2015-09-20 NOTE — Telephone Encounter (Signed)
Received a vmail from [facility] stating that a prior auth was needed for Synvisc. Called [employer] back & spoke to [place]. He stated that a prior auth was not required & that OV notes will just need to be submitted with the claim.

## 2015-09-22 ENCOUNTER — Ambulatory Visit (INDEPENDENT_AMBULATORY_CARE_PROVIDER_SITE_OTHER): Payer: 59 | Admitting: Family Medicine

## 2015-09-22 ENCOUNTER — Encounter: Payer: Self-pay | Admitting: Family Medicine

## 2015-09-22 VITALS — BP 118/72 | HR 72 | Ht 63.5 in

## 2015-09-22 DIAGNOSIS — M2241 Chondromalacia patellae, right knee: Secondary | ICD-10-CM | POA: Diagnosis not present

## 2015-09-22 NOTE — Progress Notes (Signed)
Whitney ScaleZach Trei Schoch D.O. [facility] [address] [place], [place] [address] Phone: [phone] Subjective:    I'm seeing this patient by the request  of:  Whitney Knight,Whitney AZBELL, MD   CC: right knee pain and swelling f/u  UYQ:[id number]:Subjective De NurseDenise R Dain is a 65 y.o. female coming in with complaint of right knee pain and swelling. Found to have OA of the knee on MRI grade 4 chondromalacia.  Patient has been doing very well after the first injection of viscous supplementation. Patient states that only since the rain started she had any discomfort. Otherwise patient states that she was feeling significantly better and was not even wearing the brace on a regular basis.    Past Medical History  Diagnosis Date  . Hypertension   . Diabetes mellitus   . Thyroid disease   . Chronic sinusitis   . Headache(784.0)     [facility] in past  . Diverticulosis   . Cough   . Allergy    Past Surgical History  Procedure Laterality Date  . Nasal sinus surgery  06/2008  . Vaginal delivery      2  . Dental surgery    . Dental implants    . Bone graft      for dental surgery  . Colonoscopy  2014  . Carpal tunnel release Right 02/27/2013    [facility]  . Trigger finger release Right 02/27/2013    [facility]   Social History   Social History  . Marital Status: Married    Spouse Name: N/A  . Number of Children: 2  . Years of Education: N/A   Occupational History  . [employer]    Social History Main Topics  . Smoking status: Never Smoker   . Smokeless tobacco: Never Used  . Alcohol Use: Yes     Comment: occ  . Drug Use: No  . Sexual Activity: Not on file   Other Topics Concern  . Not on file   Social History Narrative   Lives with husband. No pets, 2 children.      Work - [employer]      Diet - regular   Exercise - none presently   Allergies  Allergen Reactions  . Clarithromycin     REACTION: Nausea  . Penicillins     REACTION: Throat swelling   Family  History  Problem Relation Age of Onset  . COPD Father   . Hypertension Father   . Heart disease Father   . Heart disease Mother   . Diabetes Maternal Grandmother     Past medical history, social, surgical and family history all reviewed in electronic medical record.  No pertanent information unless stated regarding to the chief complaint.   Review of Systems: No headache, visual changes, nausea, vomiting, diarrhea, constipation, dizziness, abdominal pain, skin rash, fevers, chills, night sweats, weight loss, swollen lymph nodes, body aches, joint swelling, muscle aches, chest pain, shortness of breath, mood changes.   Objective Blood pressure 118/72, height 5' 3.5" (1.613 m).  General: No apparent distress alert and oriented x3 mood and affect normal, dressed appropriately.  HEENT: Pupils equal, extraocular movements intact  Respiratory: Patient's speak in full sentences and does not appear short of breath  Cardiovascular: No lower extremity edema, non tender, no erythema  Skin: Warm dry intact with no signs of infection or rash on extremities or on axial skeleton.  Abdomen: Soft nontender  Neuro: Cranial nerves II through XII are intact, neurovascularly intact in all  extremities with 2+ DTRs and 2+ pulses.  Lymph: No lymphadenopathy of posterior or anterior cervical chain or axillae bilaterally.  Gait normal with good balance and coordination.  MSK:  Non tender with full range of motion and good stability and symmetric strength and tone of shoulders, elbows, wrist, hip, and ankles bilaterally.  Knee:right Normal to inspection with no erythema or effusion or obvious bony abnormalities. Tender over the medial joint line ROM full in flexion and extension and lower leg rotation. Ligaments with solid consistent endpoints including ACL, PCL, LCL, MCL. Mild positive Mcmurray's, Apley's, and Thessalonian tests. painful patellar compression. Patellar glide with mild crepitus. Patellar and  quadriceps tendons unremarkable. Hamstring and quadriceps strength is normal.  Contralateral knee unremarkable Mild improvement from previous exam    After informed written and verbal consent, patient was seated on exam table. Right knee was prepped with alcohol swab and utilizing anterolateral approach, patient's right knee space was injected with 16 mg/2.5 mL of Synvisc (sodium hyaluronate) in a prefilled syringe was injected easily into the knee through a 22-gauge needle.. Patient tolerated the procedure well without immediate complications.      Impression and Recommendations:     This case required medical decision making of moderate complexity.      Note: This dictation was prepared with Dragon dictation along with smaller phrase technology. Any transcriptional errors that result from this process are unintentional.

## 2015-09-22 NOTE — Assessment & Plan Note (Signed)
Second in a series of 3 injections given today. Patient will follow-up again in 1 week. Patient will continue conservative therapy. Responding well.

## 2015-09-22 NOTE — Progress Notes (Signed)
Pre visit review using our clinic review tool, if applicable. No additional management support is needed unless otherwise documented below in the visit note. 

## 2015-09-22 NOTE — Patient Instructions (Signed)
Good to see you  Ice in 6 hours.  Stay active.  2 down and 1 to go! See you next week

## 2015-09-29 ENCOUNTER — Ambulatory Visit (INDEPENDENT_AMBULATORY_CARE_PROVIDER_SITE_OTHER): Payer: 59 | Admitting: Family Medicine

## 2015-09-29 ENCOUNTER — Encounter: Payer: Self-pay | Admitting: Family Medicine

## 2015-09-29 VITALS — BP 122/76 | HR 72 | Wt 162.0 lb

## 2015-09-29 DIAGNOSIS — M2241 Chondromalacia patellae, right knee: Secondary | ICD-10-CM

## 2015-09-29 NOTE — Assessment & Plan Note (Signed)
Patient given third in a series of 3 injections for the knee. Doing very well. We discussed icing regimen. Encourage her to start increasing activity. Follow-up in 4 weeks. Patient is in formal physical therapy already.

## 2015-09-29 NOTE — Progress Notes (Signed)
[name] [place], [place] [address] Phone: [phone] Subjective:    I'm seeing this patient by the request  of:  Rica Mast, MD   CC: right knee pain and swelling f/u  [id number] Whitney Knight is a 65 y.o. female coming in with complaint of right knee pain and swelling. Found to have OA of the knee on MRI grade 4 chondromalacia.  Patient has been doing very well after the Second injection of viscous supplementation. Had soreness 24 hours after the injection but since then has been feeling very good. Starting to increase her activity slowly. Looking forward to getting back to the gym.    Past Medical History  Diagnosis Date  . Hypertension   . Diabetes mellitus   . Thyroid disease   . Chronic sinusitis   . Headache(784.0)     [facility] in past  . Diverticulosis   . Cough   . Allergy    Past Surgical History  Procedure Laterality Date  . Nasal sinus surgery  06/2008  . Vaginal delivery      2  . Dental surgery    . Dental implants    . Bone graft      for dental surgery  . Colonoscopy  2014  . Carpal tunnel release Right 02/27/2013    [facility]  . Trigger finger release Right 02/27/2013    [facility]   Social History   Social History  . Marital Status: Married    Spouse Name: N/A  . Number of Children: 2  . Years of Education: N/A   Occupational History  . [employer]    Social History Main Topics  . Smoking status: Never Smoker   . Smokeless tobacco: Never Used  . Alcohol Use: Yes     Comment: occ  . Drug Use: No  . Sexual Activity: Not on file   Other Topics Concern  . Not on file   Social History Narrative   Lives with husband. No pets, 2 children.      Work - [employer] - regular   Exercise - none presently   Allergies  Allergen Reactions  . Clarithromycin     REACTION: Nausea  . Penicillins     REACTION: Throat swelling   Family History  Problem  Relation Age of Onset  . COPD Father   . Hypertension Father   . Heart disease Father   . Heart disease Mother   . Diabetes Maternal Grandmother     Past medical history, social, surgical and family history all reviewed in electronic medical record.  No pertanent information unless stated regarding to the chief complaint.   Review of Systems: No headache, visual changes, nausea, vomiting, diarrhea, constipation, dizziness, abdominal pain, skin rash, fevers, chills, night sweats, weight loss, swollen lymph nodes, body aches, joint swelling, muscle aches, chest pain, shortness of breath, mood changes.   Objective Blood pressure 122/76, pulse 72, weight 162 lb (73.483 kg).  General: No apparent distress alert and oriented x3 mood and affect normal, dressed appropriately.  HEENT: Pupils equal, extraocular movements intact  Respiratory: Patient's speak in full sentences and does not appear short of breath  Cardiovascular: No lower extremity edema, non tender, no erythema  Skin: Warm dry intact with no signs of infection or rash on extremities or on axial skeleton.  Abdomen: Soft nontender  Neuro: Cranial nerves II through XII are intact, neurovascularly intact in all extremities  with 2+ DTRs and 2+ pulses.  Lymph: No lymphadenopathy of posterior or anterior cervical chain or axillae bilaterally.  Gait normal with good balance and coordination.  MSK:  Non tender with full range of motion and good stability and symmetric strength and tone of shoulders, elbows, wrist, hip, and ankles bilaterally.  Knee:right Normal to inspection with no erythema or effusion or obvious bony abnormalities. Only mild tenderness over the medial joint line ROM full in flexion and extension and lower leg rotation. Ligaments with solid consistent endpoints including ACL, PCL, LCL, MCL. Mild positive Mcmurray's, Apley's, and Thessalonian tests. painful patellar compression. Patellar glide with mild crepitus. Patellar  and quadriceps tendons unremarkable. Hamstring and quadriceps strength is normal.  Contralateral knee unremarkable Continued improvement    After informed written and verbal consent, patient was seated on exam table. Right knee was prepped with alcohol swab and utilizing anterolateral approach, patient's right knee space was injected with 16 mg/2.5 mL of Synvisc (sodium hyaluronate) in a prefilled syringe was injected easily into the knee through a 22-gauge needle.. Patient tolerated the procedure well without immediate complications.      Impression and Recommendations:     This case required medical decision making of moderate complexity.      Note: This dictation was prepared with Dragon dictation along with smaller phrase technology. Any transcriptional errors that result from this process are unintentional.

## 2015-09-29 NOTE — Patient Instructions (Signed)
Great to see you  Last injection! Should conitnue to get better for another 2 weeks and then stay there for a long time (many months) Keep doing the exercises 2-3 times a week.  Ice when you need it See me again in 4 weeks if you need me.

## 2015-10-17 ENCOUNTER — Other Ambulatory Visit: Payer: Self-pay | Admitting: Internal Medicine

## 2015-10-23 ENCOUNTER — Encounter: Payer: Self-pay | Admitting: Internal Medicine

## 2015-10-26 ENCOUNTER — Encounter: Payer: Self-pay | Admitting: Family Medicine

## 2015-11-07 ENCOUNTER — Encounter: Payer: Self-pay | Admitting: Family Medicine

## 2015-11-09 ENCOUNTER — Ambulatory Visit: Payer: 59 | Admitting: Family Medicine

## 2015-11-18 ENCOUNTER — Ambulatory Visit: Payer: 59 | Admitting: Family Medicine

## 2015-11-23 ENCOUNTER — Encounter: Payer: Self-pay | Admitting: Family Medicine

## 2015-11-25 NOTE — Progress Notes (Signed)
[name] [place], [place] [address] Phone: [phone] Subjective:    I'm seeing this patient by the request  of:  Tommi Rumps, MD   CC: right knee pain and swelling f/u  [id number]  Whitney Knight is a 66 y.o. female coming in with complaint of right knee pain and swelling. Found to have  Mild to moderate OA of the knee on MRI grade 4 chondromalacia and degenerative meniscus.   Patient still on the conservative therapy and was started on viscous supplementation. Last injection was given May 31, 2 months ago. Patient is also finished with formal physical therapy. Patient states unfortunate she has not made any significant improvement. Patient continues to have pain. Still has some mild instability as well. Patient feels that she does not know what else today.     Past Medical History:  Diagnosis Date  . Allergy   . Chronic sinusitis   . Cough   . Diabetes mellitus   . Diverticulosis   . Headache(784.0)    [facility] in past  . Hypertension   . Thyroid disease    Past Surgical History:  Procedure Laterality Date  . bone graft     for dental surgery  . CARPAL TUNNEL RELEASE Right 02/27/2013   [facility]  . COLONOSCOPY  2014  . dental implants    . DENTAL SURGERY    . NASAL SINUS SURGERY  06/2008  . TRIGGER FINGER RELEASE Right 02/27/2013   [facility]  . VAGINAL DELIVERY     2   Social History   Social History  . Marital status: Married    Spouse name: N/A  . Number of children: 2  . Years of education: N/A   Occupational History  . [employer]   Social History Main Topics  . Smoking status: Never Smoker  . Smokeless tobacco: Never Used  . Alcohol use Yes     Comment: occ  . Drug use: No  . Sexual activity: Not Asked   Other Topics Concern  . None   Social History Narrative   Lives with husband. No pets, 2 children.      Work - [employer] - regular   Exercise - none  presently   Allergies  Allergen Reactions  . Clarithromycin     REACTION: Nausea  . Penicillins     REACTION: Throat swelling   Family History  Problem Relation Age of Onset  . COPD Father   . Hypertension Father   . Heart disease Father   . Heart disease Mother   . Diabetes Maternal Grandmother     Past medical history, social, surgical and family history all reviewed in electronic medical record.  No pertanent information unless stated regarding to the chief complaint.   Review of Systems: No headache, visual changes, nausea, vomiting, diarrhea, constipation, dizziness, abdominal pain, skin rash, fevers, chills, night sweats, weight loss, swollen lymph nodes, body aches, joint swelling, muscle aches, chest pain, shortness of breath, mood changes.   Objective  Blood pressure 112/72, pulse 64, weight 164 lb (74.4 kg), SpO2 96 %.  General: No apparent distress alert and oriented x3 mood and affect normal, dressed appropriately.  HEENT: Pupils equal, extraocular movements intact  Respiratory: Patient's speak in full sentences and does not appear short of breath  Cardiovascular: No lower extremity edema, non tender, no erythema  Skin: Warm dry intact with no signs of infection or rash  on extremities or on axial skeleton.  Abdomen: Soft nontender  Neuro: Cranial nerves II through XII are intact, neurovascularly intact in all extremities with 2+ DTRs and 2+ pulses.  Lymph: No lymphadenopathy of posterior or anterior cervical chain or axillae bilaterally.  Gait normal with good balance and coordination.  MSK:  Non tender with full range of motion and good stability and symmetric strength and tone of shoulders, elbows, wrist, hip, and ankles bilaterally.  Knee:right Normal to inspection with no erythema or effusion or obvious bony abnormalities. moderate tenderness over the medial joint line ROM full in flexion and extension and lower leg rotation. Ligaments with solid consistent  endpoints including ACL, PCL, LCL, MCL. positive Mcmurray's, Apley's, and Thessalonian tests. painful patellar compression. Patellar glide with mild crepitus. Patellar and quadriceps tendons unremarkable. Hamstring and quadriceps strength is normal.  Contralateral knee unremarkable Continued improvement    Impression and Recommendations:     This case required medical decision making of moderate complexity.      Note: This dictation was prepared with Dragon dictation along with smaller phrase technology. Any transcriptional errors that result from this process are unintentional.

## 2015-11-26 ENCOUNTER — Ambulatory Visit (INDEPENDENT_AMBULATORY_CARE_PROVIDER_SITE_OTHER): Payer: 59 | Admitting: Family Medicine

## 2015-11-26 ENCOUNTER — Encounter: Payer: Self-pay | Admitting: Family Medicine

## 2015-11-26 VITALS — BP 112/72 | HR 64 | Wt 164.0 lb

## 2015-11-26 DIAGNOSIS — S83241D Other tear of medial meniscus, current injury, right knee, subsequent encounter: Secondary | ICD-10-CM | POA: Diagnosis not present

## 2015-11-26 DIAGNOSIS — M2241 Chondromalacia patellae, right knee: Secondary | ICD-10-CM

## 2015-11-26 NOTE — Assessment & Plan Note (Signed)
Patient does have chondromalacia. Patient also has a chronic meniscal tear. I do feel at this time Patient has failed all conservative therapy including viscous supplementation in formal physical therapy. Patient's next step would be surgical intervention. Patient will be sent to orthopedic surgery for further evaluation and likely intervention.  Spent  25 minutes with patient face-to-face and had greater than 50% of counseling including as described above in assessment and plan.

## 2015-11-26 NOTE — Patient Instructions (Addendum)
Good to see you  We will get you in with [facility] or [facility]. [facility] Ice still is good.  Keep babying it a little for now Send me messages if you have questions.  If left knee gets worse come back and see me

## 2015-11-27 ENCOUNTER — Encounter: Payer: Self-pay | Admitting: Family Medicine

## 2015-11-29 ENCOUNTER — Other Ambulatory Visit: Payer: Self-pay | Admitting: *Deleted

## 2015-11-29 DIAGNOSIS — M2241 Chondromalacia patellae, right knee: Secondary | ICD-10-CM

## 2015-12-01 ENCOUNTER — Telehealth: Payer: Self-pay | Admitting: *Deleted

## 2015-12-01 NOTE — Telephone Encounter (Signed)
Per pt's request. Faxed [phone] OV note to [facility] [phone] to receive coverage for OV.

## 2015-12-27 ENCOUNTER — Encounter: Payer: Self-pay | Admitting: Family Medicine

## 2015-12-28 ENCOUNTER — Ambulatory Visit (INDEPENDENT_AMBULATORY_CARE_PROVIDER_SITE_OTHER): Payer: 59 | Admitting: Family Medicine

## 2015-12-28 ENCOUNTER — Encounter: Payer: Self-pay | Admitting: Family Medicine

## 2015-12-28 DIAGNOSIS — R252 Cramp and spasm: Secondary | ICD-10-CM | POA: Diagnosis not present

## 2015-12-28 DIAGNOSIS — R21 Rash and other nonspecific skin eruption: Secondary | ICD-10-CM

## 2015-12-28 MED ORDER — PREDNISONE 20 MG PO TABS: 40.0000 mg | ORAL_TABLET | Freq: Every day | ORAL | 0 refills | Status: DC

## 2015-12-28 NOTE — Progress Notes (Signed)
Pre visit review using our clinic review tool, if applicable. No additional management support is needed unless otherwise documented below in the visit note. 

## 2015-12-28 NOTE — Progress Notes (Signed)
  Tommi Rumps, MD Phone: [phone]  Whitney Knight is a 65 y.o. female who presents today for same-day visit.  Rash: Patient notes onset of rash on Sunday. Started on both sides of her back. Erythematous papules. Itches. Notes she started new vitamins on Friday and started eating new protein bars as well. Took Zyrtec last night with good benefit. Has a history of this in the past and has had significant allergy testing with no real positive results. No new soaps or detergents. No contacts with this. No travel. No fevers. Not as raised today. No airway issues with this.  Cramps: Patient notes she's been dealing with cramps in her legs for at least last year. Notes they've gotten worse recently. Just on the front of her legs. Was placed on potassium at some point in the last year. Does not drink very much water. She does report she just had lab work.  PMH: nonsmoker.   ROS see history of present illness  Objective  Physical Exam Vitals:   12/28/15 1022  BP: 134/72  Pulse: 79  Temp: 97.9 F (36.6 C)    BP Readings from Last 3 Encounters:  12/28/15 134/72  11/26/15 112/72  09/29/15 122/76   Wt Readings from Last 3 Encounters:  12/28/15 164 lb 9.6 oz (74.7 kg)  11/26/15 164 lb (74.4 kg)  09/29/15 162 lb (73.5 kg)    Physical Exam  Constitutional: No distress.  Cardiovascular: Normal rate, regular rhythm and normal heart sounds.   Pulmonary/Chest: Effort normal and breath sounds normal.  Musculoskeletal: She exhibits no edema.  Neurological: She is alert. Gait normal.  Skin: Skin is warm and dry. She is not diaphoretic.  4 patches of erythematous papules on her upper and lower bilateral back, these are nontender     Assessment/Plan: Please see individual problem list.  Rash and nonspecific skin eruption Patient with relatively nonspecific erythematous papular rash on her back. Significant itching. Given wide distribution over her back we will treat with  prednisone. Discussed monitoring her blood sugar while on this and not taking any extra sulfonylurea unless her blood sugar is greater than 300. If elevated blood sugar she will let us know.  Cramps of lower extremity Cramps in bilateral lower extremities for at least last year. Has been taking potassium supplements for this. She is unsure if she had her electrolytes checked with her recent lab work through [employer]. She will bring in her lab work for [place] to review. If she has not had electrolytes checked we will check a BMP. She will increase her water intake.   No orders of the defined types were placed in this encounter.   Meds ordered this encounter  Medications  . DISCONTD: predniSONE (DELTASONE) 20 MG tablet    Sig: Take 2 tablets (40 mg total) by mouth daily with breakfast.    Dispense:  10 tablet    Refill:  0  . predniSONE (DELTASONE) 20 MG tablet    Sig: Take 2 tablets (40 mg total) by mouth daily with breakfast.    Dispense:  10 tablet    Refill:  0    Tommi Rumps, MD [facility]

## 2015-12-28 NOTE — Assessment & Plan Note (Signed)
Patient with relatively nonspecific erythematous papular rash on her back. Significant itching. Given wide distribution over her back we will treat with prednisone. Discussed monitoring her blood sugar while on this and not taking any extra sulfonylurea unless her blood sugar is greater than 300. If elevated blood sugar she will let us know.

## 2015-12-28 NOTE — Assessment & Plan Note (Signed)
Cramps in bilateral lower extremities for at least last year. Has been taking potassium supplements for this. She is unsure if she had her electrolytes checked with her recent lab work through [employer] [facility]. She will bring in her lab work for [place] to review. If she has not had electrolytes checked we will check a BMP. She will increase her water intake.

## 2015-12-28 NOTE — Patient Instructions (Signed)
Nice to meet you. Your rash is likely related to the vitamins or protein bars that you recently added to your diet. We'll start you on prednisone to treat this. If the rash worsens or you develop throat swelling or breathing issues please seek medical attention. You need to drink plenty of fluids with your cramps. Please fax your lab results to [place].

## 2015-12-29 ENCOUNTER — Encounter: Payer: Self-pay | Admitting: Family Medicine

## 2015-12-29 ENCOUNTER — Other Ambulatory Visit: Payer: Self-pay | Admitting: Family Medicine

## 2015-12-29 DIAGNOSIS — R252 Cramp and spasm: Secondary | ICD-10-CM

## 2015-12-30 ENCOUNTER — Encounter: Payer: Self-pay | Admitting: Family Medicine

## 2015-12-31 ENCOUNTER — Other Ambulatory Visit: Payer: Self-pay | Admitting: Family Medicine

## 2015-12-31 MED ORDER — LIRAGLUTIDE 18 MG/3ML ~~LOC~~ SOPN: 1.8000 mg | PEN_INJECTOR | Freq: Every day | SUBCUTANEOUS | 2 refills | Status: DC

## 2016-01-11 ENCOUNTER — Encounter: Payer: Self-pay | Admitting: Family Medicine

## 2016-01-13 ENCOUNTER — Other Ambulatory Visit: Payer: Self-pay | Admitting: Family Medicine

## 2016-01-13 DIAGNOSIS — R252 Cramp and spasm: Secondary | ICD-10-CM

## 2016-01-22 IMAGING — RF DG ESOPHAGUS
12 of 18 series · 14 of 24 positions shown · non-contrast
Comparison: None.

CLINICAL DATA: 64-year-old diabetic hypertensive female with
dysphagia with solid foods. Initial encounter.

EXAM:
ESOPHOGRAM / BARIUM SWALLOW / BARIUM TABLET STUDY
TECHNIQUE: Combined double contrast and single contrast examination performed
using effervescent crystals, thick barium liquid, and thin barium
liquid. The patient was observed with fluoroscopy swallowing a 13 mm
barium sulphate tablet.
FLUOROSCOPY TIME:  Radiation Exposure Index (as provided by the
fluoroscopic device): 25.2 micro Gray
Fluoroscopy Time:  1 minutes and 10 seconds.

[Series 1: fluoro_barium 2fps_bw · 0.18mm/px · 2 of 9 frames shown (1 of 3)]
[frame 2/9]
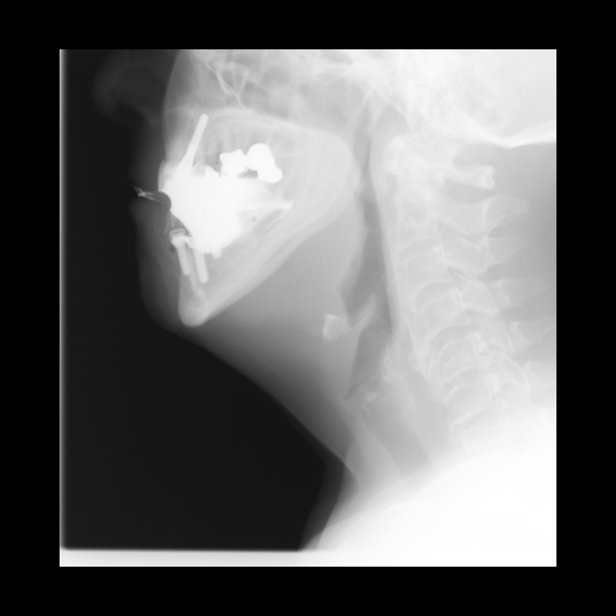
[frame 8/9]
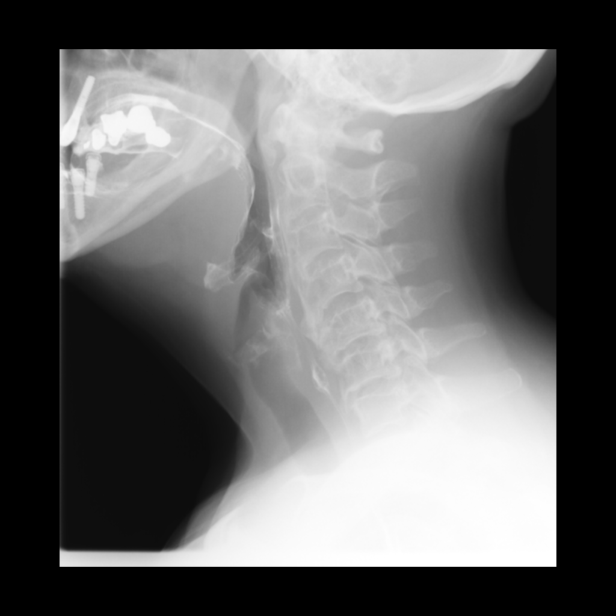

[Series 2: fluoro_barium 2fps_bw · 0.18mm/px · 1 of 15 frames shown (2 of 3)]
[frame 8/15]
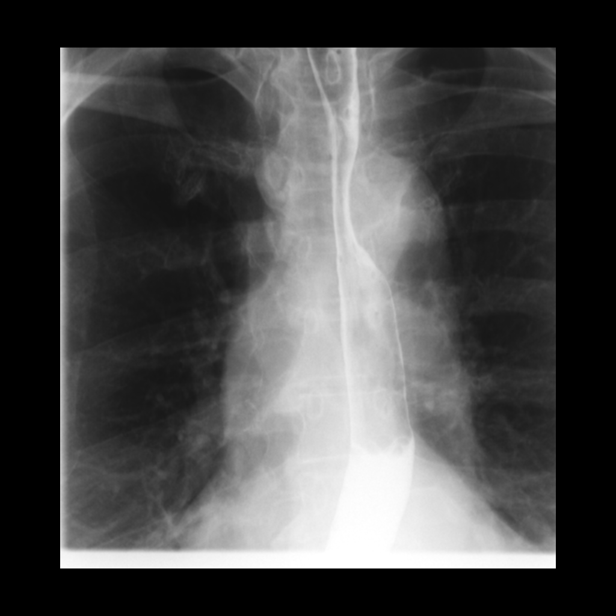

[Series 3: cp_standard · 0.27mm/px · 1 of 1 slices shown (1 of 9)]
[im 1/1]
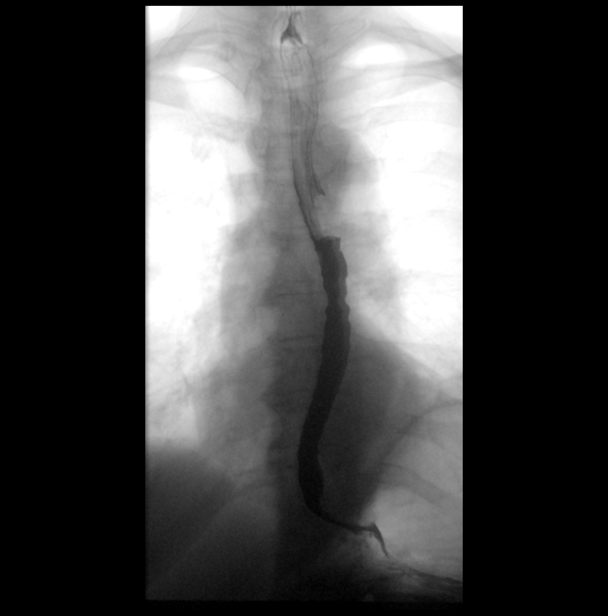

[Series 4: cp_standard · 0.18mm/px · 1 of 1 slices shown (2 of 9)]
[im 1/1]
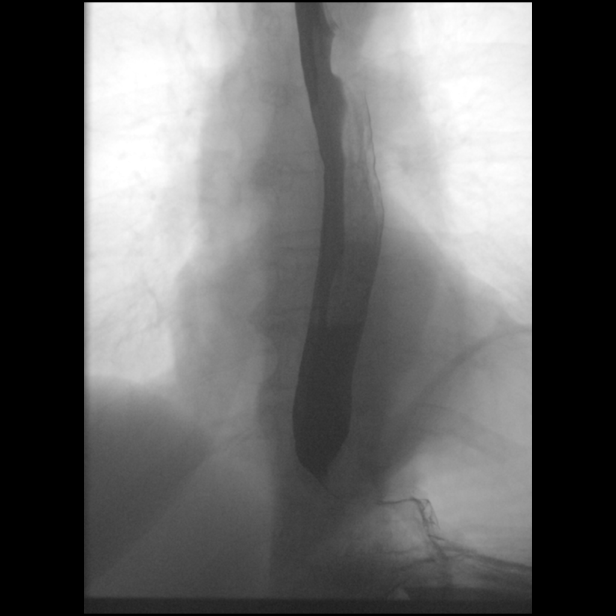

[Series 6: cp_standard · 0.18mm/px · 1 of 1 slices shown (3 of 9)]
[im 1/1]
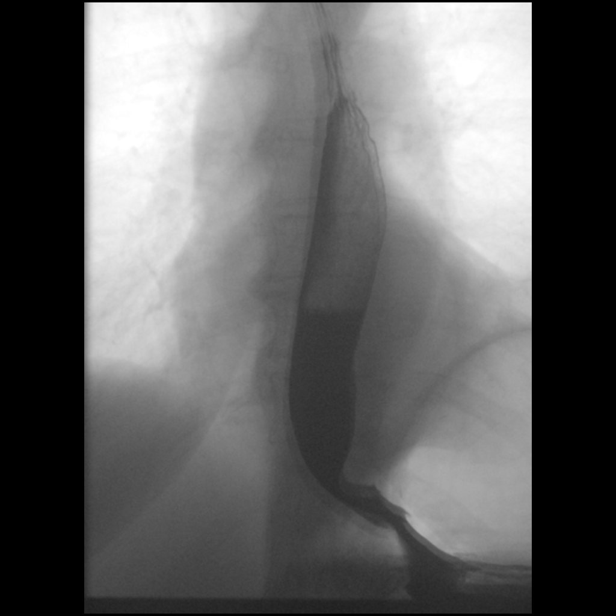

[Series 8: fluoro_barium 2fps_bw · 0.18mm/px · 2 of 9 frames shown (3 of 3)]
[frame 2/9]
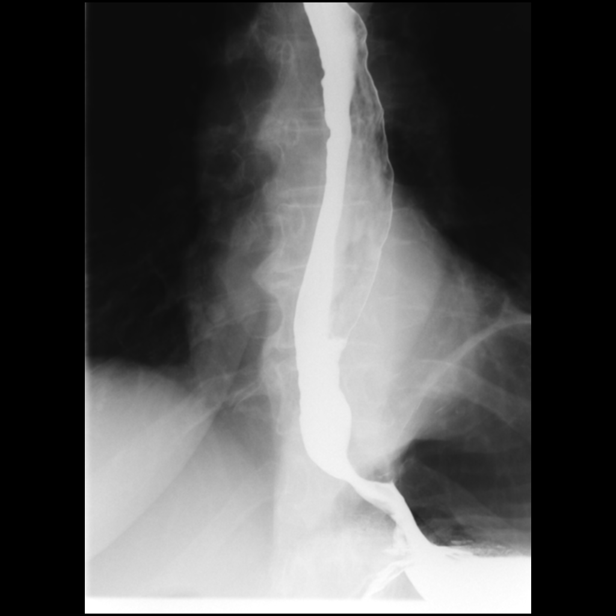
[frame 5/9]
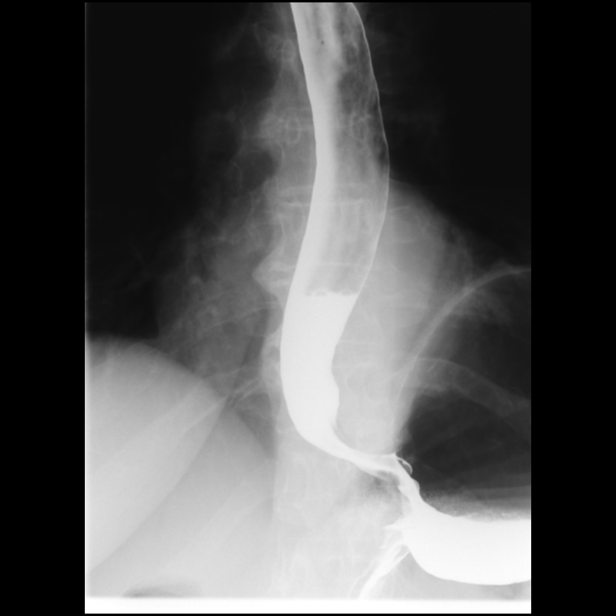

[Series 9: cp_standard · 0.18mm/px · 1 of 1 slices shown (4 of 9)]
[im 1/1]
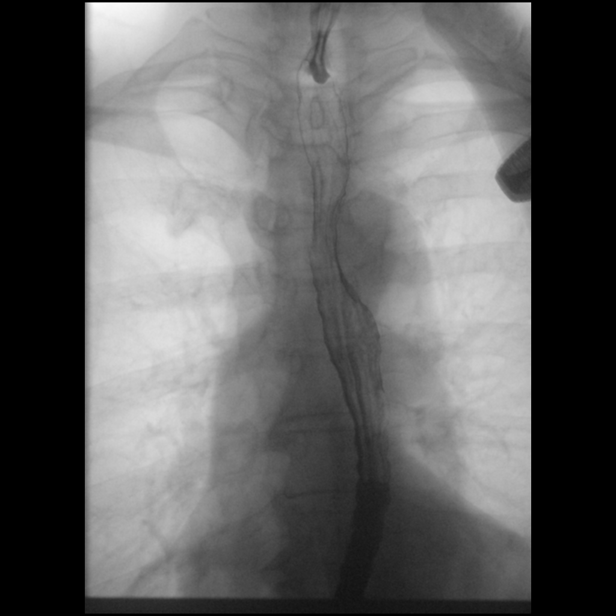

[Series 11: cp_standard · 0.18mm/px · 1 of 1 slices shown (5 of 9)]
[im 1/1]
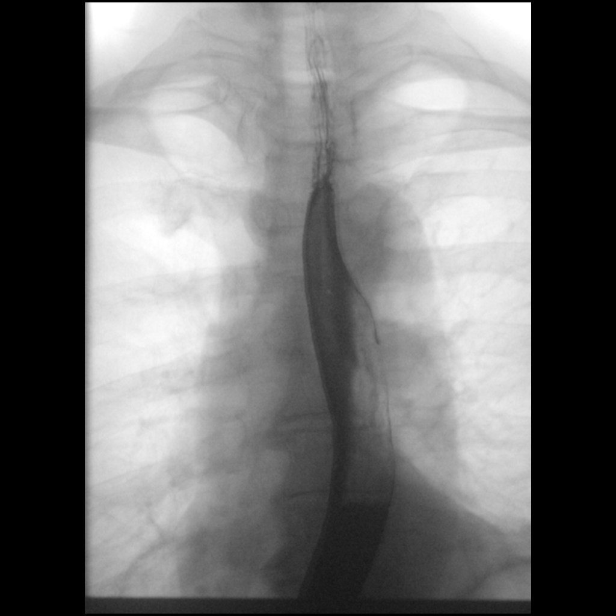

[Series 14: cp_standard · 0.29mm/px · 1 of 1 slices shown (6 of 9)]
[im 1/1]
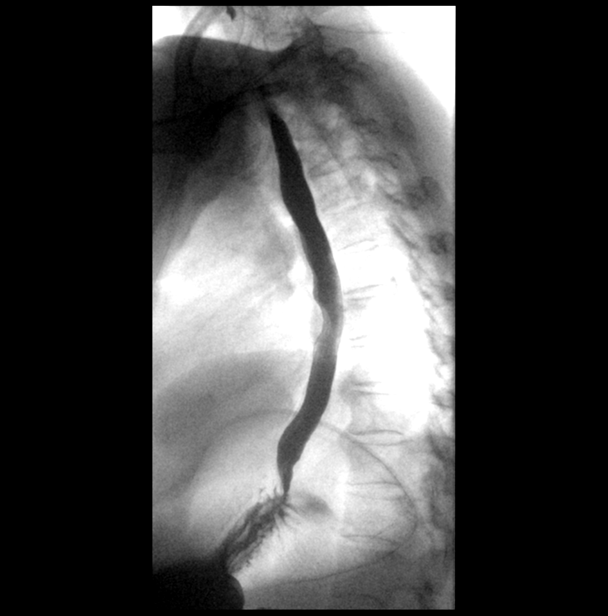

[Series 15: cp_standard · 0.29mm/px · 1 of 1 slices shown (7 of 9)]
[im 1/1]
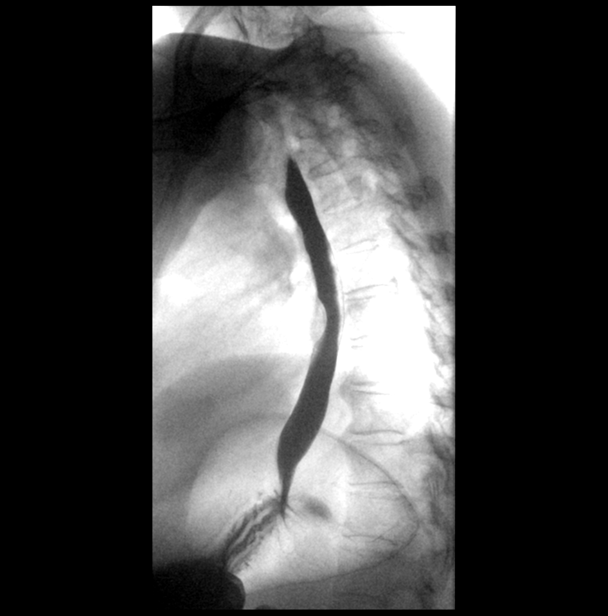

[Series 17: cp_standard · 0.29mm/px · 1 of 1 slices shown (8 of 9)]
[im 1/1]
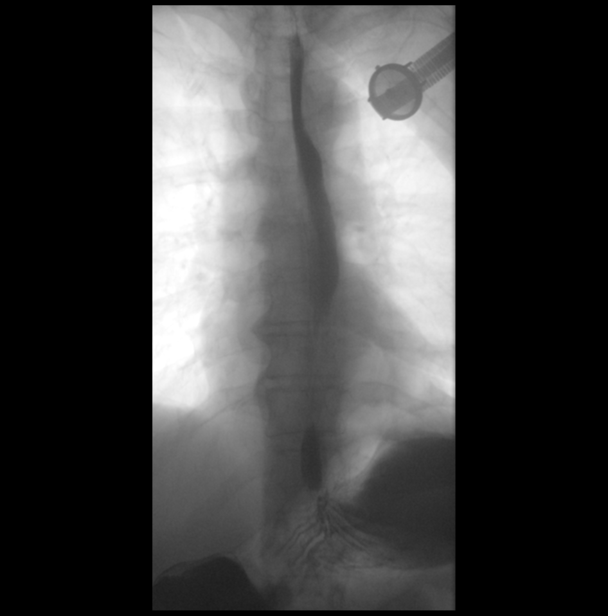

[Series 19: cp_standard · 0.29mm/px · 1 of 1 slices shown (9 of 9)]
[im 1/1]
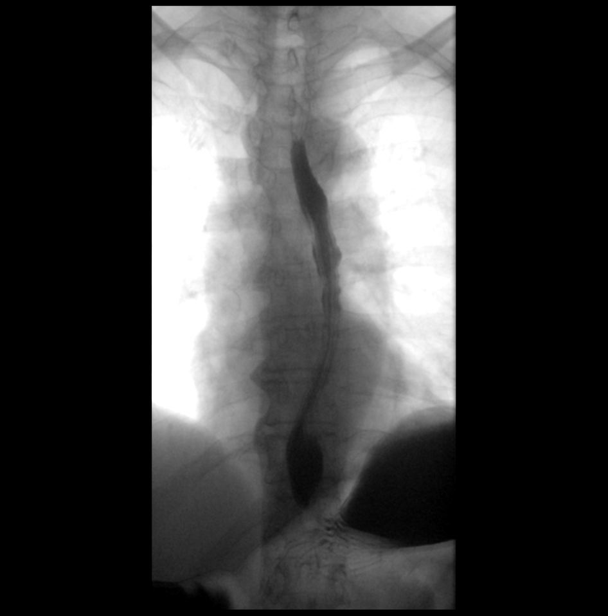

[14 of 24 positions shown; findings below may reference images not displayed]

FINDINGS: No aspiration.

Fusion C2 through C4. Anterior osteophyte C4-5 level causes
impression upon the posterior aspect of the upper cervical
esophagus.

Mild impression upon the thoracic esophagus by aorta/heart.

No esophageal obstructing lesion.

Patient ingested 13 mm barium tablet without any difficulty.

Spontaneous gastroesophageal reflux to level of the mid to upper
thoracic esophagus.

No mucosal abnormality noted.
IMPRESSION: Anterior osteophyte C4-5 level causes impression upon the posterior
aspect of the upper cervical esophagus.

No esophageal obstructing lesion.

Spontaneous gastroesophageal reflux to level of the mid to upper
thoracic esophagus. No esophageal mucosa abnormality noted. With
this degree of reflux, the patient may require endoscopic
surveillance.

Patient ingested 13 mm barium tablet without any difficulty.

## 2016-01-25 ENCOUNTER — Other Ambulatory Visit: Payer: Self-pay | Admitting: Family Medicine

## 2016-01-26 LAB — BASIC METABOLIC PANEL
BUN/Creatinine Ratio: 10 — ABNORMAL LOW (ref 12–28)
BUN: 9 mg/dL (ref 8–27)
CALCIUM: 9.4 mg/dL (ref 8.7–10.3)
CO2: 27 mmol/L (ref 18–29)
Chloride: 98 mmol/L (ref 96–106)
Creatinine, Ser: 0.94 mg/dL (ref 0.57–1.00)
GFR, EST AFRICAN AMERICAN: 75 mL/min/{1.73_m2} (ref >59)
GFR, EST NON AFRICAN AMERICAN: 65 mL/min/{1.73_m2} (ref >59)
Glucose: 153 mg/dL — ABNORMAL HIGH (ref 65–99)
POTASSIUM: 4.3 mmol/L (ref 3.5–5.2)
SODIUM: 140 mmol/L (ref 134–144)

## 2016-01-28 ENCOUNTER — Encounter: Payer: Self-pay | Admitting: Family Medicine

## 2016-02-10 ENCOUNTER — Other Ambulatory Visit: Payer: Self-pay

## 2016-02-10 ENCOUNTER — Other Ambulatory Visit: Payer: Self-pay | Admitting: Internal Medicine

## 2016-02-10 MED ORDER — METFORMIN HCL 1000 MG PO TABS: 1000.0000 mg | ORAL_TABLET | Freq: Two times a day (BID) | ORAL | 0 refills | Status: DC

## 2016-02-10 MED ORDER — LEVOTHYROXINE SODIUM 75 MCG PO TABS: 75.0000 ug | ORAL_TABLET | Freq: Every day | ORAL | 0 refills | Status: DC

## 2016-02-10 MED ORDER — ONETOUCH DELICA LANCETS 33G MISC: 11 refills | Status: DC

## 2016-02-10 NOTE — Telephone Encounter (Signed)
Last diabetes follow up on 05/07/15 with Dr. Gilford Rile. There is a scanned in A1c result from 11/2015. Pt requesting levothyroxine. The latest TSH I see is from 2014. Please advise on refill. Last OV with Dr. [name] on 12/28/15.

## 2016-02-10 NOTE — Telephone Encounter (Signed)
Refill sent to pharmacy. Patient needs to schedule follow-up appointment to discuss her chronic medical issues.

## 2016-02-11 NOTE — Telephone Encounter (Signed)
LM for patient to call the office to schedule an appointment.

## 2016-03-05 ENCOUNTER — Telehealth: Payer: 59 | Admitting: Family

## 2016-03-05 DIAGNOSIS — J028 Acute pharyngitis due to other specified organisms: Secondary | ICD-10-CM

## 2016-03-05 DIAGNOSIS — B9689 Other specified bacterial agents as the cause of diseases classified elsewhere: Secondary | ICD-10-CM

## 2016-03-05 MED ORDER — BENZONATATE 100 MG PO CAPS: 100.0000 mg | ORAL_CAPSULE | Freq: Three times a day (TID) | ORAL | 0 refills | Status: DC | PRN

## 2016-03-05 MED ORDER — DOXYCYCLINE HYCLATE 100 MG PO TABS: 100.0000 mg | ORAL_TABLET | Freq: Two times a day (BID) | ORAL | 0 refills | Status: DC

## 2016-03-05 NOTE — Progress Notes (Signed)
We are sorry that you are not feeling well.  Here is how we plan to help!  Based on what you have shared with me it looks like you have upper respiratory tract inflammation that has resulted in a significant cough.  Inflammation and infection in the upper respiratory tract is commonly called bronchitis and has four common causes:  Allergies, Viral Infections, Acid Reflux and Bacterial Infections.  Allergies, viruses and acid reflux are treated by controlling symptoms or eliminating the cause. An example might be a cough caused by taking certain blood pressure medications. You stop the cough by changing the medication. Another example might be a cough caused by acid reflux. Controlling the reflux helps control the cough.  Based on your presentation I believe you most likely have A cough due to bacteria.  When patients have a fever and a productive cough with a change in color or increased sputum production, we are concerned about bacterial bronchitis.  If left untreated it can progress to pneumonia.  If your symptoms do not improve with your treatment plan it is important that you contact your provider.   I hve prescribed Doxycycline 100 mg twice a day for 7 days     In addition you may use A non-prescription cough medication called Mucinex DM: take 2 tablets every 12 hours. and A prescription cough medication called Tessalon Perles 100mg. You may take 1-2 capsules every 8 hours as needed for your cough.  USE OF BRONCHODILATOR ("RESCUE") INHALERS: There is a risk from using your bronchodilator too frequently.  The risk is that over-reliance on a medication which only relaxes the muscles surrounding the breathing tubes can reduce the effectiveness of medications prescribed to reduce swelling and congestion of the tubes themselves.  Although you feel brief relief from the bronchodilator inhaler, your asthma may actually be worsening with the tubes becoming more swollen and filled with mucus.  This can delay  other crucial treatments, such as oral steroid medications. If you need to use a bronchodilator inhaler daily, several times per day, you should discuss this with your provider.  There are probably better treatments that could be used to keep your asthma under control.     HOME CARE . Only take medications as instructed by your medical team. . Complete the entire course of an antibiotic. . Drink plenty of fluids and get plenty of rest. . Avoid close contacts especially the very young and the elderly . Cover your mouth if you cough or cough into your sleeve. . Always remember to wash your hands . A steam or ultrasonic humidifier can help congestion.   GET HELP RIGHT AWAY IF: . You develop worsening fever. . You become short of breath . You cough up blood. . Your symptoms persist after you have completed your treatment plan MAKE SURE YOU   Understand these instructions.  Will watch your condition.  Will get help right away if you are not doing well or get worse.  Your e-visit answers were reviewed by a board certified advanced clinical practitioner to complete your personal care plan.  Depending on the condition, your plan could have included both over the counter or prescription medications. If there is a problem please reply  once you have received a response from your provider. Your safety is important to us.  If you have drug allergies check your prescription carefully.    You can use MyChart to ask questions about today's visit, request a non-urgent call back, or ask for a   work or school excuse for 24 hours related to this e-Visit. If it has been greater than 24 hours you will need to follow up with your provider, or enter a new e-Visit to address those concerns. You will get an e-mail in the next two days asking about your experience.  I hope that your e-visit has been valuable and will speed your recovery. Thank you for using e-visits.   

## 2016-04-07 ENCOUNTER — Other Ambulatory Visit: Payer: Self-pay | Admitting: Internal Medicine

## 2016-05-05 ENCOUNTER — Other Ambulatory Visit: Payer: Self-pay | Admitting: Family Medicine

## 2016-05-05 ENCOUNTER — Other Ambulatory Visit: Payer: Self-pay | Admitting: Internal Medicine

## 2016-05-05 MED ORDER — LEVOTHYROXINE SODIUM 75 MCG PO TABS: 75.0000 ug | ORAL_TABLET | Freq: Every day | ORAL | 3 refills | Status: DC

## 2016-05-05 MED ORDER — GLUCOSE BLOOD VI STRP: ORAL_STRIP | 1 refills | Status: DC

## 2016-05-05 NOTE — Telephone Encounter (Signed)
Last filled metformin 02/10/16 180 0rf Last filled levothyroxine 02/10/16 90 0rf Last filled victoza 12/31/15 18 2rf

## 2016-05-05 NOTE — Telephone Encounter (Signed)
Sent to pharmacy 

## 2016-05-05 NOTE — Telephone Encounter (Signed)
Patient needs refill on synthroid and one touch test strips

## 2016-05-05 NOTE — Telephone Encounter (Signed)
Sent to pharmacy. Please contact the patient to set up follow-up for her diabetes.

## 2016-05-05 NOTE — Telephone Encounter (Signed)
Last filled by Dr.Walker 10/19/15 100 1rf

## 2016-05-08 NOTE — Telephone Encounter (Signed)
scheduled

## 2016-05-08 NOTE — Telephone Encounter (Signed)
lmtrc

## 2016-05-10 ENCOUNTER — Telehealth: Payer: Self-pay | Admitting: *Deleted

## 2016-05-10 NOTE — Telephone Encounter (Signed)
Pt stated that she missed a call this morning, she can be reached at [phone]

## 2016-05-10 NOTE — Telephone Encounter (Signed)
Called patient, I received a refill request from [facility] and patient states she has already received refill

## 2016-05-11 ENCOUNTER — Encounter: Payer: Self-pay | Admitting: Family Medicine

## 2016-05-11 NOTE — Telephone Encounter (Signed)
Patient would like levothyroxine sent to mail order

## 2016-05-24 ENCOUNTER — Telehealth: Payer: Self-pay | Admitting: Pulmonary Disease

## 2016-05-24 NOTE — Telephone Encounter (Signed)
    LB PU  Patient calls because she has a worse cough the last 24 hours and requesting to be seen on January 25 urgently. She is not able to sleep because of the cough. She recently saw ENT. At some point, she saw Dr. Lake Bells. She can see any provider in the office.   (ENT diagnosis was nasal cellulitis, also with GERD, asthma per ENT).   PLs call pt in am and schedule an acute visit in am. Patient's daughter works for [employer], MD 05/24/2016, 8:30 PM  Pulmonary and Critical Care Pager [phone] After 3 pm or if no answer, call [phone]

## 2016-05-25 ENCOUNTER — Ambulatory Visit (INDEPENDENT_AMBULATORY_CARE_PROVIDER_SITE_OTHER): Payer: 59 | Admitting: Pulmonary Disease

## 2016-05-25 ENCOUNTER — Other Ambulatory Visit: Payer: Self-pay | Admitting: *Deleted

## 2016-05-25 ENCOUNTER — Ambulatory Visit
Admission: RE | Admit: 2016-05-25 | Discharge: 2016-05-25 | Disposition: A | Discharge status: Home / Self Care | Payer: 59 | Source: Ambulatory Visit | Attending: Pulmonary Disease | Admitting: Pulmonary Disease

## 2016-05-25 ENCOUNTER — Encounter: Payer: Self-pay | Admitting: Pulmonary Disease

## 2016-05-25 VITALS — BP 122/78 | HR 103 | Wt 154.0 lb

## 2016-05-25 DIAGNOSIS — R0600 Dyspnea, unspecified: Secondary | ICD-10-CM

## 2016-05-25 DIAGNOSIS — R059 Cough, unspecified: Secondary | ICD-10-CM

## 2016-05-25 DIAGNOSIS — R112 Nausea with vomiting, unspecified: Secondary | ICD-10-CM

## 2016-05-25 DIAGNOSIS — R05 Cough: Secondary | ICD-10-CM | POA: Diagnosis not present

## 2016-05-25 DIAGNOSIS — J310 Chronic rhinitis: Secondary | ICD-10-CM

## 2016-05-25 DIAGNOSIS — R490 Dysphonia: Secondary | ICD-10-CM | POA: Diagnosis not present

## 2016-05-25 DIAGNOSIS — K219 Gastro-esophageal reflux disease without esophagitis: Secondary | ICD-10-CM | POA: Diagnosis not present

## 2016-05-25 DIAGNOSIS — J329 Chronic sinusitis, unspecified: Secondary | ICD-10-CM

## 2016-05-25 MED ORDER — PREDNISONE 20 MG PO TABS: 40.0000 mg | ORAL_TABLET | Freq: Every day | ORAL | 0 refills | Status: AC

## 2016-05-25 MED ORDER — HYDROCOD POLST-CPM POLST ER 10-8 MG/5ML PO SUER: 5.0000 mL | Freq: Two times a day (BID) | ORAL | 0 refills | Status: DC | PRN

## 2016-05-25 MED ORDER — MAGIC MOUTHWASH: 5.0000 mL | Freq: Three times a day (TID) | ORAL | 0 refills | Status: DC | PRN

## 2016-05-25 MED ORDER — LEVOFLOXACIN 500 MG PO TABS: 500.0000 mg | ORAL_TABLET | Freq: Every day | ORAL | 0 refills | Status: AC

## 2016-05-25 NOTE — Telephone Encounter (Signed)
Pt was last seen BQ in 2012. Pt is considered a new pt. I have offered pt an apt with BQ for a consult on 05/30/15. Pt states she needs to be seen today, as AD explained to pt that she could be seen today for an acute visit. I have explained our office protocol with pt. Pt c/o prod cough with yellow mucus, wheezing, temp of 99.8 Tuesday night X 1 wk. Pt f/u with her ENT yesterday and was advised to contact our office.  Pt is requesting an sooner apt then 05/29/16  CY please advise. Thanks.   Current Outpatient Prescriptions on File Prior to Visit  Medication Sig Dispense Refill  . aspirin 81 MG tablet Take 81 mg by mouth daily.      . BD PEN NEEDLE NANO U/F 32G X 4 MM MISC Inject daily as directed 90 each 3  . benzonatate (TESSALON PERLES) 100 MG capsule Take 1-2 capsules (100-200 mg total) by mouth every 8 (eight) hours as needed for cough. 30 capsule 0  . Betamethasone Valerate 0.12 % foam APPLY TO AFFECTED AREA EVERY DAY 100 g 0  . Casanthranol-Docusate Sodium 30-100 MG CAPS Take 1 tablet by mouth daily. 90 each 3  . cholecalciferol (VITAMIN D) 1000 UNITS tablet Take 1,000 Units by mouth daily.    . cyclobenzaprine (FLEXERIL) 10 MG tablet Take 1 tablet (10 mg total) by mouth 3 (three) times daily as needed for muscle spasms. 30 tablet 0  . doxycycline (VIBRA-TABS) 100 MG tablet Take 1 tablet (100 mg total) by mouth 2 (two) times daily. 14 tablet 0  . fish oil-omega-3 fatty acids 1000 MG capsule Take 1 g by mouth daily.      . fluticasone (FLONASE) 50 MCG/ACT nasal spray Use 2 sprays nasally daily 48 g 3  . glucose blood (ONE TOUCH ULTRA TEST) test strip Use as instructed to test  blood sugar twice daily. 100 each 1  . Ibuprofen-Famotidine 800-26.6 MG TABS Take 1 tablet 3 times daily as needed. 270 tablet 3  . levothyroxine (SYNTHROID, LEVOTHROID) 75 MCG tablet Take 1 tablet (75 mcg total) by mouth daily. 90 tablet 3  . losartan-hydrochlorothiazide (HYZAAR) 100-12.5 MG tablet Take 1 tablet by  mouth  daily 90 tablet 3  . metFORMIN (GLUCOPHAGE) 1000 MG tablet TAKE 1 TABLET BY MOUTH TWO  TIMES DAILY WITH A MEAL 180 tablet 1  . omeprazole (PRILOSEC) 20 MG capsule TAKE 1 CAPSULE BY MOUTH TWO TIMES DAILY 180 capsule 0  . ondansetron (ZOFRAN) 8 MG tablet Take 1 tablet (8 mg total) by mouth every 8 (eight) hours as needed for nausea or vomiting. 30 tablet 0  . ONETOUCH DELICA LANCETS 99991111 MISC Use 2-3 times daily as directed 100 each 11  . oxybutynin (DITROPAN-XL) 5 MG 24 hr tablet Take 1 tablet by mouth once a day 90 tablet 3  . POTASSIUM CHLORIDE PO Take by mouth. Taking OTC, unsure mg    . predniSONE (DELTASONE) 20 MG tablet Take 2 tablets (40 mg total) by mouth daily with breakfast. 10 tablet 0  . Probiotic Product (PROBIOTIC DAILY PO) Take by mouth.    [name] VICTOZA 18 MG/3ML SOPN INJECT SUBCUTANEOUSLY 1.8MG  DAILY 27 mL 1   No current facility-administered medications on file prior to visit.     Allergies  Allergen Reactions  . Clarithromycin     REACTION: Nausea  . Penicillins     REACTION: Throat swelling

## 2016-05-25 NOTE — Telephone Encounter (Signed)
I have reached out to Momence is going to see pt @ 12:00 today with a CXR prior. Pt is made aware and voiced her understanding. Nothing further needed.

## 2016-05-25 NOTE — Patient Instructions (Addendum)
1) Stop Septra, Fish oil 2) Levofloxacin (Levaquin) 500 mg daily for 5 days 3) Prednisone 40 mg daily for 5 days 4) Tussionex 1 tsp each night for 5 days and may be used once in morning if coughing 5) Voice rest for 5 days 6) Hold off on Advair for now  Follow up with Dr Mortimer Fries in one week

## 2016-05-28 ENCOUNTER — Encounter: Payer: Self-pay | Admitting: Pulmonary Disease

## 2016-05-28 NOTE — Progress Notes (Signed)
PULMONARY CONSULT NOTE  Requesting MD/Service: Self Date of initial consultation: 05/26/15 Reason for consultation: chronic cough  PT PROFILE: 66 y.o. F never smoker with severe subacute cough  HPI:  66 F seen as self referral for evaluation of cough of approx 3 weeks duration. Her problem started in early January with fever, sinus pressure and nasal congestion with rhinorrhea. She seen by Dr Tami Ribas and diagnosed with internal nasal cellulitis. She was treated with gentamicin ointment, TMP-SMX (which has caused nausea) and nasal washes. She was seen again by Dr Tami Ribas 01/24 and the cellulitis was deemed better but her cough persisted. She now is practically disabled with persistent nonproductive cough. The nasal congestion and fever are resolved. She continues to have posterior nasal drainage. She was tried on Advair which caused her to feel "wired". During this encounter, she is coughing vigorously and frequently with a rattling but NP cough. She has been prescribed Flonase which she has not used regularly  Past Medical History:  Diagnosis Date  . Allergy   . Chronic sinusitis   . Cough   . Diabetes mellitus   . Diverticulosis   . Headache(784.0)    [facility] in past  . Hypertension   . Thyroid disease     Past Surgical History:  Procedure Laterality Date  . bone graft     for dental surgery  . CARPAL TUNNEL RELEASE Right 02/27/2013   [facility]  . COLONOSCOPY  2014  . dental implants    . DENTAL SURGERY    . NASAL SINUS SURGERY  06/2008  . TRIGGER FINGER RELEASE Right 02/27/2013   [facility]  . VAGINAL DELIVERY     2    MEDICATIONS: I have reviewed all medications and confirmed regimen as documented  Social History   Social History  . Marital status: Married    Spouse name: N/A  . Number of children: 2  . Years of education: N/A   Occupational History  . [employer]   Social History Main Topics  . Smoking status: Never Smoker  . Smokeless  tobacco: Never Used  . Alcohol use Yes     Comment: occ  . Drug use: No  . Sexual activity: Not on file   Other Topics Concern  . Not on file   Social History Narrative   Lives with husband. No pets, 2 children.      Work - [employer] - regular   Exercise - none presently    Family History  Problem Relation Age of Onset  . COPD Father   . Hypertension Father   . Heart disease Father   . Heart disease Mother   . Diabetes Maternal Grandmother     ROS: No fever, myalgias/arthralgias, unexplained weight loss or weight gain No new focal weakness or sensory deficits No otalgia, hearing loss, visual changes, nasal and sinus symptoms, mouth and throat problems No neck pain or adenopathy No abdominal pain, N/V/D, diarrhea, change in bowel pattern No dysuria, change in urinary pattern   Vitals:   05/25/16 1203  BP: 122/78  Pulse: (!) 103  SpO2: 99%  Weight: 154 lb (69.9 kg)     EXAM:  Gen: Hoarse voice quality, rattling cough, No overt respiratory distress HEENT: NCAT, sclera white, oropharynx normal, severe B rhinitis with moderate mucoid secretions Neck: Supple without LAN, thyromegaly, JVD Lungs: breath sounds slightly coarse, no wheezes Cardiovascular: RRR, no murmurs noted Abdomen: Soft, nontender, normal BS Ext: without clubbing, cyanosis,  edema Neuro: CNs grossly intact, motor and sensory intact Skin: Limited exam, no lesions noted  DATA:   BMP Latest Ref Rng & Units 01/25/2016 06/09/2015 12/23/2014  Glucose 65 - 99 mg/dL 153(H) 115(H) -  BUN 8 - 27 mg/dL 9 9 -  Creatinine 0.57 - 1.00 mg/dL 0.94 0.83 0.9  BUN/Creat Ratio 12 - 28 10(L) 11 -  Sodium 134 - 144 mmol/L 140 140 -  Potassium 3.5 - 5.2 mmol/L 4.3 3.7 -  Chloride 96 - 106 mmol/L 98 98 -  CO2 18 - 29 mmol/L 27 26 -  Calcium 8.7 - 10.3 mg/dL 9.4 9.4 -    CBC Latest Ref Rng & Units 05/13/2012  WBC 3.4 - 10.8 x10E3/uL 5.9  Hemoglobin 11.1 - 15.9 g/dL 13.5  Hematocrit 34.0 - 46.6 % 40.7   Platelets 155 - 379 x10E3/uL 190    CXR (05/25/16): [facility]    IMPRESSION:     ICD-9-CM ICD-10-CM   1. Cough 786.2 R05   2. Rhinosinusitis 473.9 J32.9   3. Hoarseness 784.42 R49.0   4. Suspected/possible GERD 530.81 K21.9   5. Nausea, likely secondary to TMP-SMX 787.01 R11.2    Intractable subacute cough likely has an upper respiratory etiology but she also has few rhonchi suggesting possible component of acute bronchitis. At this point, likely multifactorial cough with a cyclical/self perpetuating component  PLAN:  1) Stop Septra, Fish oil (which might be exacerbating GERD) 2) Levofloxacin (Levaquin) 500 mg daily for 5 days 3) Prednisone 40 mg daily for 5 days 4) Tussionex 1 tsp each night for 5 days and may be used once in morning if coughing 5) Cont omeprazole 20 mg qHS 6) Voice rest for 5 days 7) Hold off on Advair for now which might be exacerbating throat irritation 8) Follow up with Dr Mortimer Fries in one week   Merton Border, MD PCCM service Mobile [phone] Pager [phone] 05/28/2016

## 2016-05-30 ENCOUNTER — Other Ambulatory Visit: Payer: Self-pay

## 2016-05-30 ENCOUNTER — Encounter: Payer: Self-pay | Admitting: Family Medicine

## 2016-05-30 MED ORDER — MAGIC MOUTHWASH: 5.0000 mL | Freq: Three times a day (TID) | ORAL | 0 refills | Status: DC | PRN

## 2016-05-31 ENCOUNTER — Other Ambulatory Visit: Payer: Self-pay | Admitting: Family Medicine

## 2016-06-02 ENCOUNTER — Ambulatory Visit: Payer: 59 | Admitting: Internal Medicine

## 2016-06-02 LAB — HM DIABETES EYE EXAM

## 2016-06-05 ENCOUNTER — Ambulatory Visit: Payer: 59 | Admitting: Family Medicine

## 2016-06-07 ENCOUNTER — Encounter: Payer: Self-pay | Admitting: Family Medicine

## 2016-06-07 NOTE — Progress Notes (Signed)
o

## 2016-06-09 ENCOUNTER — Ambulatory Visit (INDEPENDENT_AMBULATORY_CARE_PROVIDER_SITE_OTHER): Payer: 59 | Admitting: Pulmonary Disease

## 2016-06-09 ENCOUNTER — Encounter: Payer: Self-pay | Admitting: Pulmonary Disease

## 2016-06-09 VITALS — BP 124/88 | HR 73 | Wt 154.0 lb

## 2016-06-09 DIAGNOSIS — J45901 Unspecified asthma with (acute) exacerbation: Secondary | ICD-10-CM

## 2016-06-09 DIAGNOSIS — R05 Cough: Secondary | ICD-10-CM

## 2016-06-09 DIAGNOSIS — J309 Allergic rhinitis, unspecified: Secondary | ICD-10-CM

## 2016-06-09 DIAGNOSIS — R059 Cough, unspecified: Secondary | ICD-10-CM

## 2016-06-09 MED ORDER — FLUTICASONE-SALMETEROL 115-21 MCG/ACT IN AERO: 2.0000 | INHALATION_SPRAY | Freq: Two times a day (BID) | RESPIRATORY_TRACT | 10 refills | Status: DC

## 2016-06-09 MED ORDER — DOXYCYCLINE HYCLATE 100 MG PO TABS: 100.0000 mg | ORAL_TABLET | Freq: Two times a day (BID) | ORAL | 0 refills | Status: DC

## 2016-06-09 NOTE — Patient Instructions (Addendum)
Resume Advair inhaler as previously prescribed  Doxycycline 100 mg twice a day for 7 days  Follow up in 2-3 weeks

## 2016-06-15 NOTE — Progress Notes (Signed)
PULMONARY OFFICE FOLLOW UP NOTE  Requesting MD/Service: Self Date of initial consultation: 05/26/15 Reason for consultation: chronic cough  PT PROFILE: 66 y.o. F never smoker with severe subacute cough. Initially treated for GERD and rhinosinusitis  SUBJ: With measures last visit, she improved initially but then developed nasal congestion and thick green mucus last week. Denies CP, fever, hemoptysis, LE edema and calf tenderness  Vitals:   06/09/16 1348  BP: 124/88  Pulse: 73  SpO2: 98%  Weight: 154 lb (69.9 kg)   EXAM:  Gen: No overt respiratory distress HEENT: NCAT, sclera white, oropharynx normal, moderate rhinitis with mild mucoid secretions Neck: Supple without LAN, thyromegaly, JVD Lungs: distant wheezes Cardiovascular: RRR, no murmurs noted Abdomen: Soft, nontender, normal BS Ext: without clubbing, cyanosis, edema Neuro: CNs grossly intact, motor and sensory intact Skin: Limited exam, no lesions noted  DATA:   BMP Latest Ref Rng & Units 01/25/2016 06/09/2015 12/23/2014  Glucose 65 - 99 mg/dL 153(H) 115(H) -  BUN 8 - 27 mg/dL 9 9 -  Creatinine 0.57 - 1.00 mg/dL 0.94 0.83 0.9  BUN/Creat Ratio 12 - 28 10(L) 11 -  Sodium 134 - 144 mmol/L 140 140 -  Potassium 3.5 - 5.2 mmol/L 4.3 3.7 -  Chloride 96 - 106 mmol/L 98 98 -  CO2 18 - 29 mmol/L 27 26 -  Calcium 8.7 - 10.3 mg/dL 9.4 9.4 -    CBC Latest Ref Rng & Units 05/13/2012  WBC 3.4 - 10.8 x10E3/uL 5.9  Hemoglobin 11.1 - 15.9 g/dL 13.5  Hematocrit 34.0 - 46.6 % 40.7  Platelets 155 - 379 x10E3/uL 190    CXR (05/25/16): [facility]    IMPRESSION:   1) Subacute cough - suspect multifactorial 2) Severe rhinitis - improved with nasal steroid 3) Suspected GERD -symptoms well controlled 4) Possible asthmatic bronchitis with purulent mucus  PLAN:  1) Continue previous therapies 2) Resume Advair inhaler 3) Doxycycline X 7 days 4) Follow up in 2-3 weeks   Merton Border, MD PCCM service Mobile [phone] Pager  [phone] 06/15/2016

## 2016-06-26 ENCOUNTER — Ambulatory Visit: Payer: 59 | Admitting: Pulmonary Disease

## 2016-06-29 ENCOUNTER — Other Ambulatory Visit: Payer: Self-pay | Admitting: Internal Medicine

## 2016-07-17 ENCOUNTER — Ambulatory Visit: Payer: 59 | Admitting: Pulmonary Disease

## 2016-07-21 ENCOUNTER — Other Ambulatory Visit: Payer: Self-pay | Admitting: Family Medicine

## 2016-07-24 ENCOUNTER — Other Ambulatory Visit: Payer: Self-pay

## 2016-07-24 MED ORDER — INSULIN PEN NEEDLE 32G X 4 MM MISC: 3 refills | Status: DC

## 2016-07-24 NOTE — Telephone Encounter (Signed)
Patient is scheduled   

## 2016-07-24 NOTE — Telephone Encounter (Signed)
Sent to pharmacy. Patient needs follow-up scheduled. Thanks. 

## 2016-07-24 NOTE — Telephone Encounter (Signed)
[facility] is requesting refill on patients pen needles last OV 12/28/15 no follow up scheduled

## 2016-07-24 NOTE — Telephone Encounter (Signed)
Left message to return call 

## 2016-07-25 ENCOUNTER — Other Ambulatory Visit: Payer: Self-pay

## 2016-07-25 MED ORDER — LOSARTAN POTASSIUM-HCTZ 100-12.5 MG PO TABS: 1.0000 | ORAL_TABLET | Freq: Every day | ORAL | 3 refills | Status: DC

## 2016-07-25 NOTE — Telephone Encounter (Signed)
Last OV 12/28/15 last filled by Dr.Walker 07/27/15 patient has follow up scheduled

## 2016-07-26 ENCOUNTER — Other Ambulatory Visit: Payer: Self-pay

## 2016-07-26 MED ORDER — GLUCOSE BLOOD VI STRP: ORAL_STRIP | 1 refills | Status: DC

## 2016-07-27 ENCOUNTER — Other Ambulatory Visit: Payer: Self-pay

## 2016-07-27 MED ORDER — OXYBUTYNIN CHLORIDE ER 5 MG PO TB24: 5.0000 mg | ORAL_TABLET | Freq: Every day | ORAL | 0 refills | Status: DC

## 2016-07-27 NOTE — Telephone Encounter (Signed)
Last OV 12/28/15 last filled by Dr.Walker 07/02/15 90 3rf

## 2016-07-27 NOTE — Telephone Encounter (Signed)
Please make sure patient has follow-up scheduled. Refill sent to pharmacy. Thanks.

## 2016-07-31 NOTE — Telephone Encounter (Signed)
scheduled

## 2016-08-23 ENCOUNTER — Ambulatory Visit (INDEPENDENT_AMBULATORY_CARE_PROVIDER_SITE_OTHER): Payer: 59 | Admitting: Family Medicine

## 2016-08-23 ENCOUNTER — Encounter: Payer: Self-pay | Admitting: Family Medicine

## 2016-08-23 DIAGNOSIS — E1165 Type 2 diabetes mellitus with hyperglycemia: Secondary | ICD-10-CM

## 2016-08-23 DIAGNOSIS — J3089 Other allergic rhinitis: Secondary | ICD-10-CM

## 2016-08-23 DIAGNOSIS — E039 Hypothyroidism, unspecified: Secondary | ICD-10-CM | POA: Diagnosis not present

## 2016-08-23 DIAGNOSIS — I10 Essential (primary) hypertension: Secondary | ICD-10-CM

## 2016-08-23 DIAGNOSIS — IMO0001 Reserved for inherently not codable concepts without codable children: Secondary | ICD-10-CM

## 2016-08-23 NOTE — Assessment & Plan Note (Signed)
Continue current medications. We'll check an A1c.

## 2016-08-23 NOTE — Assessment & Plan Note (Signed)
Suspect congestion and dizziness related to allergies and congestion. Encouraged continued Flonase and nasal saline. She had negative Dix-Hallpike and the symptoms have not recurred since using nasal saline rinse. If recurs she'll let us know.

## 2016-08-23 NOTE — Assessment & Plan Note (Signed)
Continue Synthroid.  Check TSH. 

## 2016-08-23 NOTE — Progress Notes (Signed)
Pre visit review using our clinic review tool, if applicable. No additional management support is needed unless otherwise documented below in the visit note. 

## 2016-08-23 NOTE — Progress Notes (Signed)
  Whitney Rumps, MD Phone: [phone]  Whitney Knight is a 66 y.o. female who presents today for f/u.  HYPERTENSION  Disease Monitoring  Home BP Monitoring not checking Chest pain- no    Dyspnea- no Medications  Compliance-  Taking hyzaar.  Edema- no  DIABETES Disease Monitoring: Blood Sugar ranges-110 Polyuria/phagia/dipsia- some polyuria if drinks too much H2O      Medications: Compliance- taking victoza, metformin Hypoglycemic symptoms- rare and only occurs if she takes glimeperide   Patient would like her ears checked today. Notes she has felt some mild nasal congestion and at times felt as though the room will spin around her she lays back and turns her head to the right. She notes she's been using a nasal saline rinse and Flonase. The vertigo symptoms have not recurred since yesterday after using the nasal saline rinse. Does have history of allergies.  Hypothyroidism: Taking Synthroid. No weight changes. No skin changes. No heat or cold intolerance.  PMH: nonsmoker.   ROS see history of present illness  Objective  Physical Exam Vitals:   08/23/16 1423  BP: 120/70  Pulse: 67  Temp: 98.2 F (36.8 C)    BP Readings from Last 3 Encounters:  08/23/16 120/70  06/09/16 124/88  05/25/16 122/78   Wt Readings from Last 3 Encounters:  08/23/16 157 lb 12.8 oz (71.6 kg)  06/09/16 154 lb (69.9 kg)  05/25/16 154 lb (69.9 kg)    Physical Exam  Constitutional: No distress.  HENT:  Head: Normocephalic and atraumatic.  Mouth/Throat: Oropharynx is clear and moist. No oropharyngeal exudate.  Normal TMs, negative Dix-Hallpike  Eyes: Conjunctivae are normal. Pupils are equal, round, and reactive to light.  Cardiovascular: Normal rate, regular rhythm and normal heart sounds.   Pulmonary/Chest: Effort normal and breath sounds normal.  Musculoskeletal: She exhibits no edema.  Neurological: She is alert. Gait normal.  Skin: Skin is warm and dry. She is not diaphoretic.    Diabetic Foot Exam - Simple   Simple Foot Form Diabetic Foot exam was performed with the following findings:  Yes 08/23/2016  2:48 PM  Visual Inspection No deformities, no ulcerations, no other skin breakdown bilaterally:  Yes Sensation Testing Intact to touch and monofilament testing bilaterally:  Yes Pulse Check Posterior Tibialis and Dorsalis pulse intact bilaterally:  Yes Comments     Assessment/Plan: Please see individual problem list.  Hypertension At goal. Continue current medications. [employer]. order form filled out for labs.  Seasonal allergies Suspect congestion and dizziness related to allergies and congestion. Encouraged continued Flonase and nasal saline. She had negative Dix-Hallpike and the symptoms have not recurred since using nasal saline rinse. If recurs she'll let us know.  Hypothyroidism Continue Synthroid. Check TSH.  Diabetes mellitus type 2, uncontrolled Continue current medications. We'll check an A1c.   Whitney Rumps, MD [place]

## 2016-08-23 NOTE — Assessment & Plan Note (Signed)
At goal. Continue current medications. [employer]. order form filled out for labs.

## 2016-08-23 NOTE — Patient Instructions (Signed)
Nice to see you. Please get the lab work through [employer] [facility]. Please monitor your blood sugar. Please continue the Flonase and nasal saline rinses. If the room spinning returns please let us know.

## 2016-08-28 NOTE — Progress Notes (Signed)
Patient notified

## 2016-10-09 ENCOUNTER — Other Ambulatory Visit: Payer: Self-pay | Admitting: Family Medicine

## 2016-10-15 ENCOUNTER — Other Ambulatory Visit: Payer: Self-pay | Admitting: Family Medicine

## 2016-10-16 ENCOUNTER — Other Ambulatory Visit: Payer: Self-pay

## 2016-10-16 ENCOUNTER — Other Ambulatory Visit: Payer: Self-pay | Admitting: Family Medicine

## 2016-10-16 MED ORDER — FLUTICASONE PROPIONATE 50 MCG/ACT NA SUSP: 2.0000 | Freq: Every day | NASAL | 1 refills | Status: DC

## 2016-11-24 ENCOUNTER — Ambulatory Visit: Payer: 59 | Admitting: Family Medicine

## 2017-01-10 ENCOUNTER — Other Ambulatory Visit: Payer: Self-pay | Admitting: Family Medicine

## 2017-01-24 NOTE — Telephone Encounter (Signed)
Error

## 2017-02-05 ENCOUNTER — Other Ambulatory Visit: Payer: Self-pay | Admitting: Family Medicine

## 2017-02-05 ENCOUNTER — Other Ambulatory Visit: Payer: Self-pay | Admitting: Internal Medicine

## 2017-02-27 IMAGING — CR DG CHEST 2V
2 series · 2 of 2 positions shown · non-contrast
Comparison: PA and lateral chest x-ray April 20, 2011

CLINICAL DATA: Productive cough for 8 days. Fever for 5 days.
Dyspnea.

EXAM:
CHEST  2 VIEW

[chest pa]
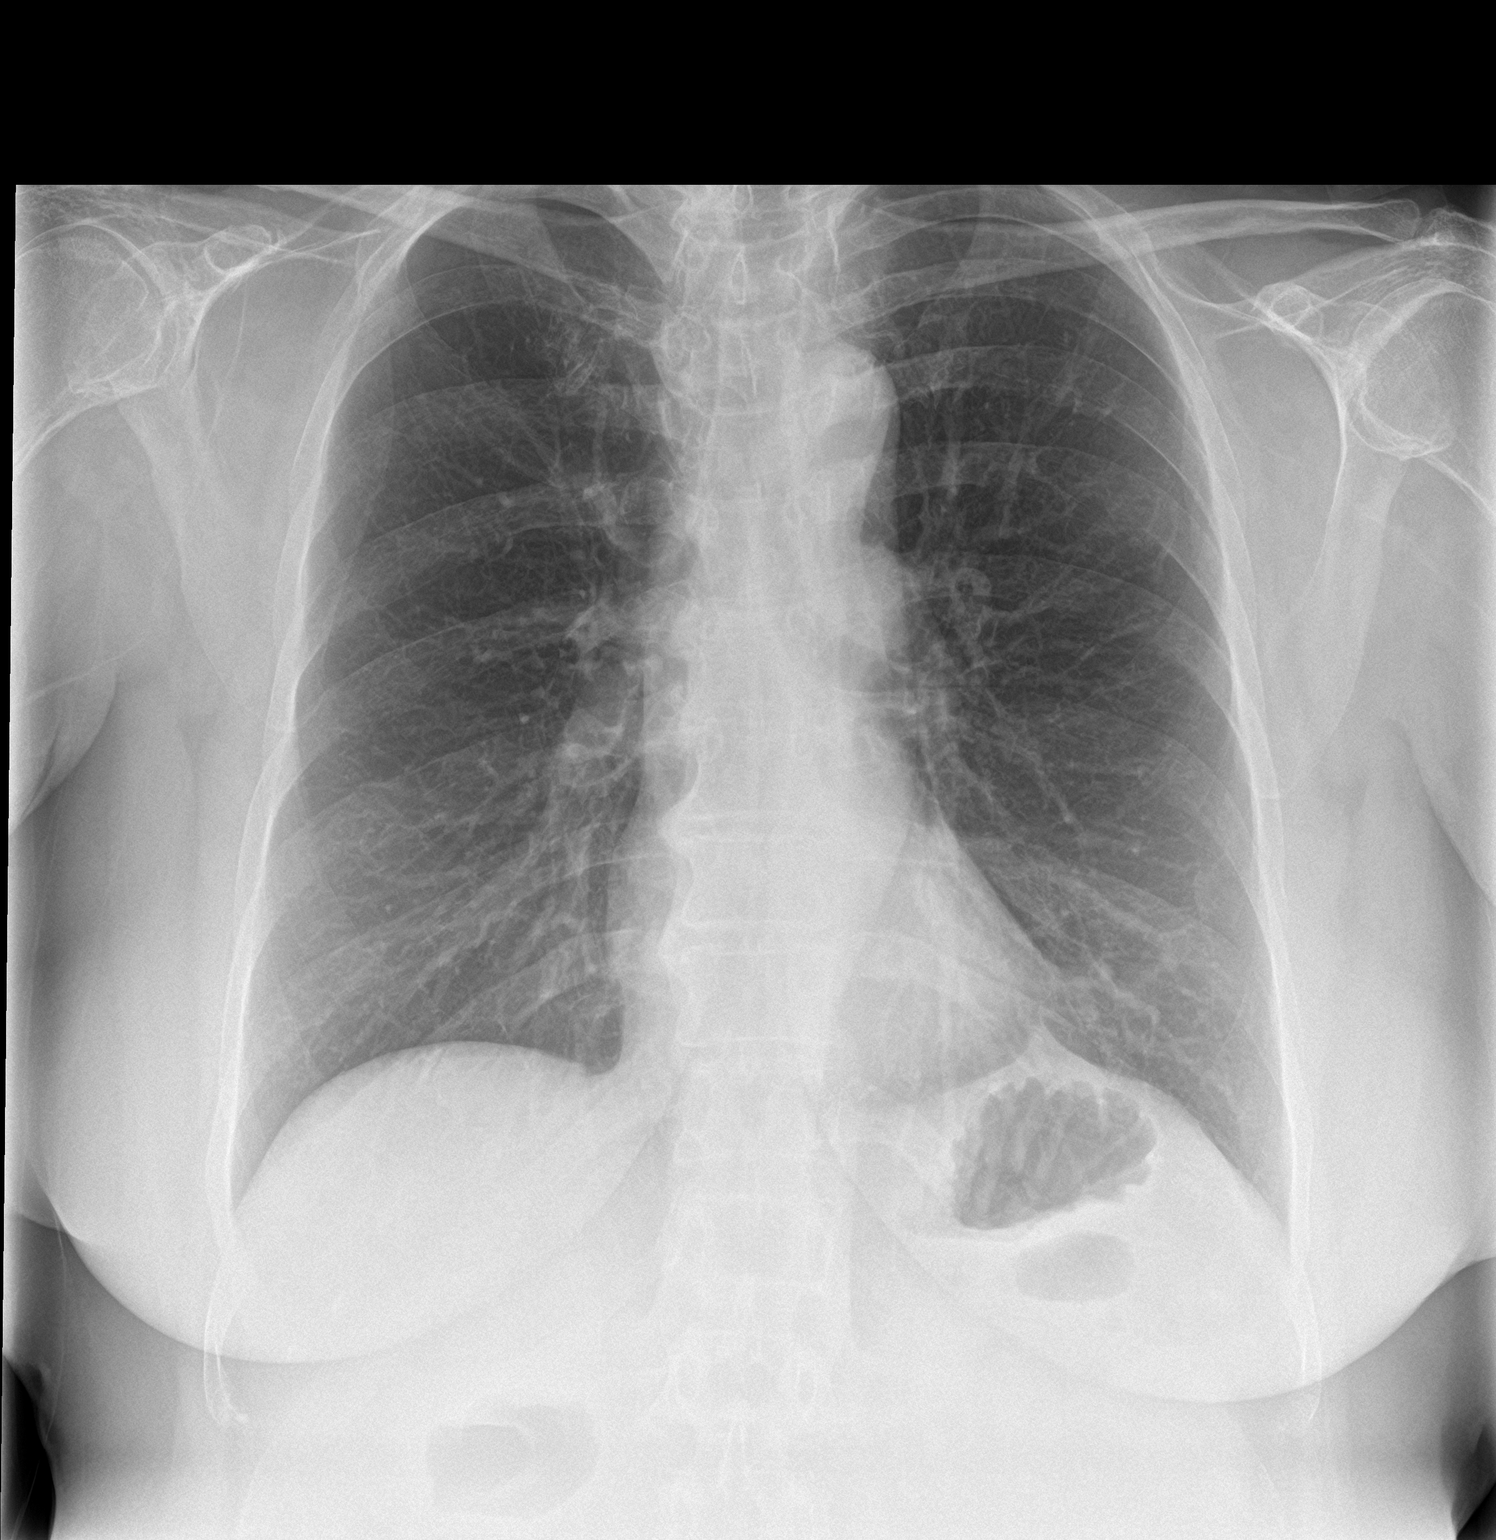

[chest lat]
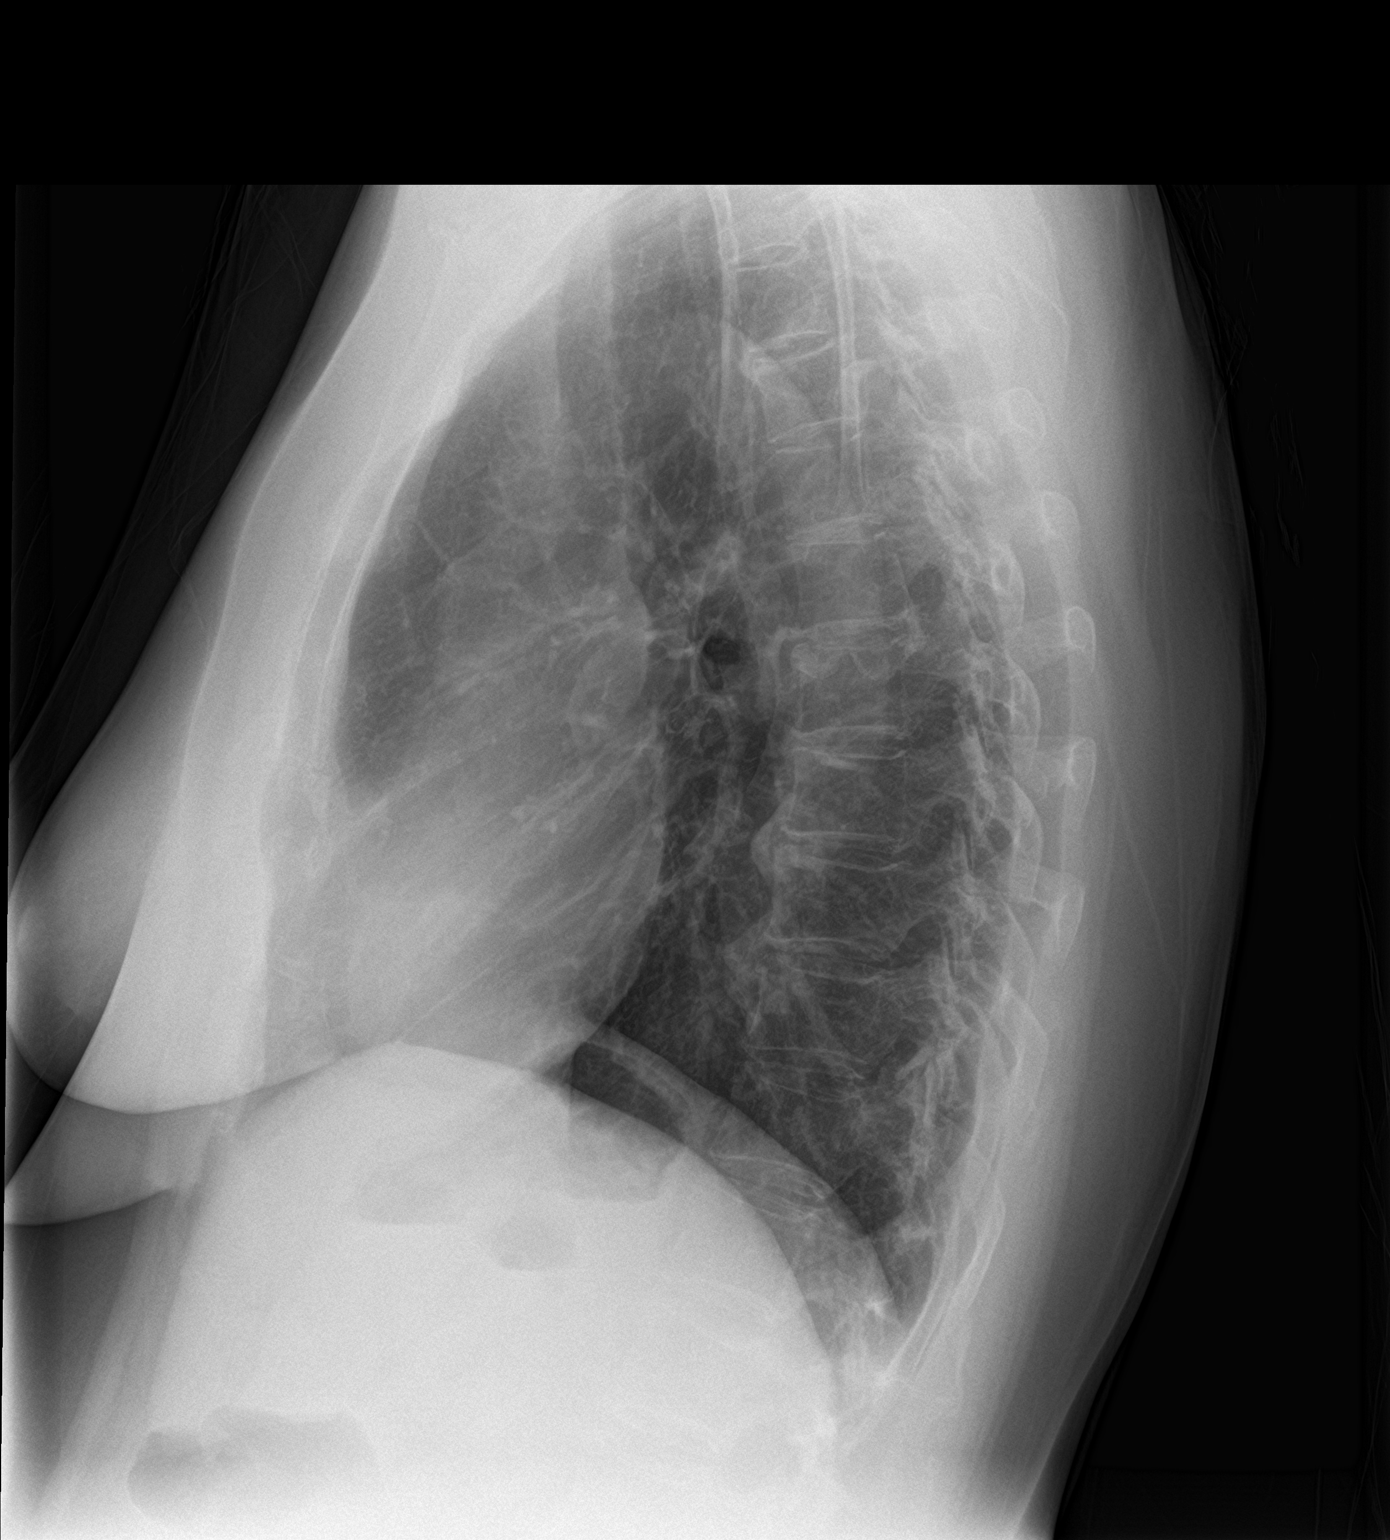

[2 of 2 positions shown; findings below may reference images not displayed]

FINDINGS: The lungs are well-expanded and clear. The heart and pulmonary
vascularity are normal. The mediastinum is normal in width. There is
no pleural effusion. The bony thorax exhibits no acute abnormality.
IMPRESSION: There is no pneumonia nor other acute cardiopulmonary abnormality.

## 2017-03-24 ENCOUNTER — Other Ambulatory Visit: Payer: Self-pay | Admitting: Family Medicine

## 2017-03-27 ENCOUNTER — Other Ambulatory Visit: Payer: Self-pay | Admitting: Family Medicine

## 2017-03-29 NOTE — Telephone Encounter (Signed)
Last OV 08/23/16 last filled  08/15/16 100g 0rf

## 2017-03-30 ENCOUNTER — Encounter: Payer: Self-pay | Admitting: Family Medicine

## 2017-03-30 NOTE — Telephone Encounter (Signed)
Please determine what this is for.  It has not been refilled since 2017.  Thanks.

## 2017-04-05 ENCOUNTER — Telehealth: Payer: Self-pay | Admitting: Family Medicine

## 2017-04-05 MED ORDER — BETAMETHASONE VALERATE 0.12 % EX FOAM: CUTANEOUS | 0 refills | Status: DC

## 2017-04-05 NOTE — Telephone Encounter (Signed)
Pt  Requesting  A  Refill   Of  betamethosone  Foam     She  Has  A  Rash   On  Back  Of  Scalp    Has  Had  The  Rash    approx  2   Weeks   The  Rash   Is  Itchy  And  flakey . Her  Last  flairup      Was   May   2018 . Pharmacy  Of   Choice  Is  [facility]  [address]  Lorina Rabon. The   Foam    Was   Initially  Prescribed  By  Dr  Derrel Nip

## 2017-04-05 NOTE — Telephone Encounter (Signed)
Left message to return call, ok for [facility] to speak to patient to see what she takes the betamethasone foam for it has not been filled since 2017

## 2017-04-05 NOTE — Telephone Encounter (Signed)
Sent to pharmacy. If not improving she needs to be evaluated.

## 2017-04-05 NOTE — Addendum Note (Signed)
Addended by: Leone Haven on: 04/05/2017 05:35 PM   Modules accepted: Orders

## 2017-04-05 NOTE — Telephone Encounter (Signed)
Please advise 

## 2017-04-06 NOTE — Telephone Encounter (Signed)
LMTCB

## 2017-04-06 NOTE — Telephone Encounter (Signed)
Pt aware rx sent to pharmacy, and if not better, needs to be seen

## 2017-04-11 NOTE — Telephone Encounter (Signed)
No return call 

## 2017-04-12 ENCOUNTER — Other Ambulatory Visit: Payer: Self-pay | Admitting: Internal Medicine

## 2017-04-12 ENCOUNTER — Other Ambulatory Visit: Payer: Self-pay | Admitting: Family Medicine

## 2017-05-12 ENCOUNTER — Encounter: Payer: Self-pay | Admitting: Family Medicine

## 2017-05-14 NOTE — Telephone Encounter (Signed)
Patient had a fall last Monday ribs are hurting patient on the right side near bra line and sore to touch anteriorly and sharpe pain posterior Patient stated she is in a lot of pain when she sneezes cough or if she moves a certain way fast, pain rated at a 8 or 9 with movement at rest is rated at a 6 . I have scheduled patient for 05/15/17 @ 9:15 am all I saw available .

## 2017-05-15 ENCOUNTER — Emergency Department
Admission: EM | Admit: 2017-05-15 | Discharge: 2017-05-15 | Disposition: A | Discharge status: Home / Self Care | Payer: 59 | Attending: Emergency Medicine | Admitting: Emergency Medicine

## 2017-05-15 ENCOUNTER — Ambulatory Visit: Payer: 59 | Admitting: Family Medicine

## 2017-05-15 ENCOUNTER — Telehealth: Payer: Self-pay | Admitting: Radiology

## 2017-05-15 ENCOUNTER — Other Ambulatory Visit: Payer: Self-pay

## 2017-05-15 ENCOUNTER — Encounter: Payer: Self-pay | Admitting: Family Medicine

## 2017-05-15 ENCOUNTER — Encounter: Payer: Self-pay | Admitting: *Deleted

## 2017-05-15 ENCOUNTER — Ambulatory Visit (INDEPENDENT_AMBULATORY_CARE_PROVIDER_SITE_OTHER): Payer: 59

## 2017-05-15 VITALS — BP 140/80 | HR 71 | Temp 98.0°F | Wt 162.8 lb

## 2017-05-15 DIAGNOSIS — E1165 Type 2 diabetes mellitus with hyperglycemia: Secondary | ICD-10-CM | POA: Diagnosis not present

## 2017-05-15 DIAGNOSIS — Y999 Unspecified external cause status: Secondary | ICD-10-CM | POA: Insufficient documentation

## 2017-05-15 DIAGNOSIS — Z79899 Other long term (current) drug therapy: Secondary | ICD-10-CM | POA: Insufficient documentation

## 2017-05-15 DIAGNOSIS — W19XXXA Unspecified fall, initial encounter: Secondary | ICD-10-CM | POA: Diagnosis not present

## 2017-05-15 DIAGNOSIS — S29091A Other injury of muscle and tendon of front wall of thorax, initial encounter: Secondary | ICD-10-CM | POA: Diagnosis present

## 2017-05-15 DIAGNOSIS — Y939 Activity, unspecified: Secondary | ICD-10-CM | POA: Insufficient documentation

## 2017-05-15 DIAGNOSIS — Z7982 Long term (current) use of aspirin: Secondary | ICD-10-CM | POA: Insufficient documentation

## 2017-05-15 DIAGNOSIS — Z794 Long term (current) use of insulin: Secondary | ICD-10-CM | POA: Insufficient documentation

## 2017-05-15 DIAGNOSIS — Y92481 Parking lot as the place of occurrence of the external cause: Secondary | ICD-10-CM | POA: Diagnosis not present

## 2017-05-15 DIAGNOSIS — S2241XA Multiple fractures of ribs, right side, initial encounter for closed fracture: Secondary | ICD-10-CM | POA: Insufficient documentation

## 2017-05-15 DIAGNOSIS — I10 Essential (primary) hypertension: Secondary | ICD-10-CM | POA: Insufficient documentation

## 2017-05-15 DIAGNOSIS — E119 Type 2 diabetes mellitus without complications: Secondary | ICD-10-CM | POA: Diagnosis not present

## 2017-05-15 DIAGNOSIS — M89311 Hypertrophy of bone, right shoulder: Secondary | ICD-10-CM | POA: Diagnosis not present

## 2017-05-15 DIAGNOSIS — R0781 Pleurodynia: Secondary | ICD-10-CM | POA: Diagnosis not present

## 2017-05-15 DIAGNOSIS — S2239XA Fracture of one rib, unspecified side, initial encounter for closed fracture: Secondary | ICD-10-CM | POA: Insufficient documentation

## 2017-05-15 MED ORDER — HYDROCODONE-ACETAMINOPHEN 5-325 MG PO TABS: 1.0000 | ORAL_TABLET | Freq: Four times a day (QID) | ORAL | 0 refills | Status: DC | PRN

## 2017-05-15 MED ORDER — CYCLOBENZAPRINE HCL 5 MG PO TABS: 5.0000 mg | ORAL_TABLET | Freq: Three times a day (TID) | ORAL | 0 refills | Status: AC | PRN

## 2017-05-15 NOTE — ED Notes (Signed)
See triage note states she fell last week   Having pain to right lateral rib area   Was told by pcp that she had 4 broken ribs  Has been going to chiropractor   Increased pain with cough and deep breathing

## 2017-05-15 NOTE — Progress Notes (Signed)
Tommi Rumps, MD Phone: [phone]  Whitney Knight is a 67 y.o. female who presents today for same-day visit.  Patient reports 1 week ago she fell and landed on her right side shoulder and ribs.  She had no loss of consciousness or head injury.  She notes since then most of her pain has been in her lateral right ribs and posterior right ribs with spasms.  She notes fairly significant pain.  She notes no fevers.  She notes she has had shallow breathing related to pain though no shortness of breath.  No cough though does note a little upper respiratory congestion.  She saw her chiropractor on 2 occasions and they did not do an x-ray.  She reports on the first occasion he picked her up.  Diabetes: Notes CBG was 105 this morning.  She had an A1c in August that was 7.8.  She is taking metformin and Victoza.  No polyuria or polydipsia.  No hypoglycemia.  Social History   Tobacco Use  Smoking Status Never Smoker  Smokeless Tobacco Never Used     ROS see history of present illness  Objective  Physical Exam Vitals:   05/15/17 0918  BP: 140/80  Pulse: 71  Temp: 98 F (36.7 C)  SpO2: 97%    BP Readings from Last 3 Encounters:  05/15/17 (!) 144/88  05/15/17 140/80  08/23/16 120/70   Wt Readings from Last 3 Encounters:  05/15/17 162 lb (73.5 kg)  05/15/17 162 lb 12.8 oz (73.8 kg)  08/23/16 157 lb 12.8 oz (71.6 kg)    Physical Exam  Constitutional: No distress.  Cardiovascular: Normal rate, regular rhythm and normal heart sounds.  Pulmonary/Chest: Breath sounds normal. No respiratory distress. She has no wheezes. She has no rales.  She is taking shallow breaths due to discomfort with deeper breaths, tender over right mid lateral and posterior ribs with no evidence of flail chest  Musculoskeletal: She exhibits no edema.  Prominent right clavicle in the midportion compared to the left, no palpable step-off or tenderness  Neurological: She is alert. Gait normal.  Skin: Skin  is warm and dry. She is not diaphoretic.     Assessment/Plan: Please see individual problem list.  Rib pain on right side She is status post fall 1 week ago with injury to right ribs.  History and exam concerning for rib fracture.  Will obtain rib films and chest x-ray.  Given asymmetry between clavicles we will obtain right clavicle film to rule out fracture there as well.  We will determine the next step in management once her x-rays return.  Diabetes mellitus type 2, uncontrolled Due for repeat A1c.  She will continue her current regimen.   Orders Placed This Encounter  Procedures  . DG Ribs Unilateral W/Chest Right    Standing Status:   Future    Number of Occurrences:   1    Standing Expiration Date:   07/14/2018    Order Specific Question:   Reason for Exam (SYMPTOM  OR DIAGNOSIS REQUIRED)    Answer:   right rib pain s/p fall and injury to right ribs    Order Specific Question:   Preferred imaging location?    Answer:   [facility] Specific Question:   Radiology Contrast Protocol - do NOT remove file path    Answer:   \\[email or web address]\[email or web address]\Radiant\DXFluoroContrastProtocols.pdf  . DG Clavicle Right    Standing Status:   Future    Number of Occurrences:  1    Standing Expiration Date:   07/14/2018    Order Specific Question:   Reason for Exam (SYMPTOM  OR DIAGNOSIS REQUIRED)    Answer:   right clavicular prominence s/p fall, nontender    Order Specific Question:   Preferred imaging location?    Answer:   [facility] Specific Question:   Radiology Contrast Protocol - do NOT remove file path    Answer:   \\[email or web address]\Radiant\DXFluoroContrastProtocols.pdf  . Comp Met (CMET)  . HgB A1c    No orders of the defined types were placed in this encounter.    Tommi Rumps, MD [place]

## 2017-05-15 NOTE — Patient Instructions (Signed)
Nice to see you. We will get x-rays and contact you with the results.  You need to try to take as deeper breaths as you are able to.  If you develop shortness of breath increasing cough, fevers, or new symptoms please be evaluated. We will check lab work today and contact you with the results.

## 2017-05-15 NOTE — Assessment & Plan Note (Signed)
Due for repeat A1c.  She will continue her current regimen.

## 2017-05-15 NOTE — ED Notes (Signed)
FN: pt was sent over by Dr Biagio Quint for further eval of broken ribs.

## 2017-05-15 NOTE — Telephone Encounter (Signed)
Pt was seen today by provider. Pt sat down in lab chair and stated that the only place that does not bruise is her left lateral vein. Felt vein and advised pt that did not feel the vein very well and asked if I could take a look at her other arm. Pt was adamant about using the left lateral vein. Advised pt I would try that vein but did explain that the vein was not very well felt. Pt stated that she had no water. Unsuccessful attempt on left lateral vein. Asked pt to look at right arm. Pt stated, "this has never happened before, I work at [employer] so". Successful attempt in right AC. Pt stated, "well I hope this one doesn't bruise." Pt was then taken to xray. During right rib films pt was rotated for oblique view of right ribs. Pt stated, " you do know it's not my left side that's hurt right?" Advised pt that correct side was being imaged and explained to her that reason for turning the body for the image.

## 2017-05-15 NOTE — Discharge Instructions (Signed)
Your x-ray shows rib fractures to the 4-7th ribs. Take the prescription meds as directed. Apply ice and moist heat to the ribs. Use the incentive spirometer every hour to practice deep breathing. Return to the ED for worsening symptoms as discussed. Follow-up with Dr. [name] as needed.

## 2017-05-15 NOTE — Assessment & Plan Note (Signed)
She is status post fall 1 week ago with injury to right ribs.  History and exam concerning for rib fracture.  Will obtain rib films and chest x-ray.  Given asymmetry between clavicles we will obtain right clavicle film to rule out fracture there as well.  We will determine the next step in management once her x-rays return.

## 2017-05-15 NOTE — ED Triage Notes (Signed)
Pt to ED reporting she had an xray performed at PCP after a fall in the [employer] parking lot last week. PTs PCP called her today and reported she had 4 "displaced ribs" and was told to come to ED for pain management. PT reports she has been managing for the past week but reports pain when she coughs. PCP did not give pt incentive spirometer.   Pt also reports feeling concerned about hurting her clavical as well.

## 2017-05-17 LAB — COMPREHENSIVE METABOLIC PANEL
A/G RATIO: 1.3 (ref 1.2–2.2)
ALK PHOS: 103 IU/L (ref 39–117)
ALT: 18 IU/L (ref 0–32)
AST: 24 IU/L (ref 0–40)
Albumin: 4.1 g/dL (ref 3.6–4.8)
BILIRUBIN TOTAL: 0.3 mg/dL (ref 0.0–1.2)
BUN/Creatinine Ratio: 16 (ref 12–28)
BUN: 20 mg/dL (ref 8–27)
CALCIUM: 9.6 mg/dL (ref 8.7–10.3)
CHLORIDE: 99 mmol/L (ref 96–106)
CO2: 22 mmol/L (ref 20–29)
Creatinine, Ser: 1.28 mg/dL — ABNORMAL HIGH (ref 0.57–1.00)
GFR calc Af Amer: 51 mL/min/{1.73_m2} — ABNORMAL LOW (ref >59)
GFR, EST NON AFRICAN AMERICAN: 44 mL/min/{1.73_m2} — AB (ref >59)
Globulin, Total: 3.2 g/dL (ref 1.5–4.5)
Glucose: 153 mg/dL — ABNORMAL HIGH (ref 65–99)
POTASSIUM: 4.2 mmol/L (ref 3.5–5.2)
Sodium: 137 mmol/L (ref 134–144)
Total Protein: 7.3 g/dL (ref 6.0–8.5)

## 2017-05-17 LAB — SPECIMEN STATUS REPORT

## 2017-05-17 LAB — HEMOGLOBIN A1C
ESTIMATED AVERAGE GLUCOSE: 166 mg/dL
HEMOGLOBIN A1C: 7.4 % — AB (ref 4.8–5.6)
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
## 2017-05-22 ENCOUNTER — Encounter: Payer: Self-pay | Admitting: Family Medicine

## 2017-05-23 ENCOUNTER — Other Ambulatory Visit: Payer: Self-pay | Admitting: Family Medicine

## 2017-05-23 MED ORDER — SITAGLIPTIN PHOSPHATE 50 MG PO TABS: 50.0000 mg | ORAL_TABLET | Freq: Every day | ORAL | 3 refills | Status: DC

## 2017-05-23 MED ORDER — METFORMIN HCL 1000 MG PO TABS: 1000.0000 mg | ORAL_TABLET | Freq: Two times a day (BID) | ORAL | 2 refills | Status: DC

## 2017-05-24 ENCOUNTER — Other Ambulatory Visit: Payer: Self-pay | Admitting: Family Medicine

## 2017-05-24 DIAGNOSIS — R7989 Other specified abnormal findings of blood chemistry: Secondary | ICD-10-CM

## 2017-05-24 NOTE — Progress Notes (Signed)
bmet  

## 2017-05-25 NOTE — Telephone Encounter (Signed)
See result and phone notes 

## 2017-05-30 NOTE — Addendum Note (Signed)
Addended by: Arby Barrette on: 05/30/2017 03:06 PM   Modules accepted: Orders

## 2017-05-31 ENCOUNTER — Other Ambulatory Visit (INDEPENDENT_AMBULATORY_CARE_PROVIDER_SITE_OTHER): Payer: 59

## 2017-05-31 ENCOUNTER — Encounter: Payer: Self-pay | Admitting: Family Medicine

## 2017-05-31 DIAGNOSIS — R7989 Other specified abnormal findings of blood chemistry: Secondary | ICD-10-CM

## 2017-06-01 LAB — BASIC METABOLIC PANEL
BUN / CREAT RATIO: 13 (ref 12–28)
BUN: 15 mg/dL (ref 8–27)
CO2: 27 mmol/L (ref 20–29)
CREATININE: 1.12 mg/dL — AB (ref 0.57–1.00)
Calcium: 9.5 mg/dL (ref 8.7–10.3)
Chloride: 95 mmol/L — ABNORMAL LOW (ref 96–106)
GFR, EST AFRICAN AMERICAN: 60 mL/min/{1.73_m2} (ref >59)
GFR, EST NON AFRICAN AMERICAN: 52 mL/min/{1.73_m2} — AB (ref >59)
Glucose: 156 mg/dL — ABNORMAL HIGH (ref 65–99)
Potassium: 4.1 mmol/L (ref 3.5–5.2)
SODIUM: 137 mmol/L (ref 134–144)

## 2017-06-05 ENCOUNTER — Encounter: Payer: Self-pay | Admitting: Family Medicine

## 2017-06-05 ENCOUNTER — Other Ambulatory Visit: Payer: Self-pay | Admitting: Family Medicine

## 2017-06-06 ENCOUNTER — Encounter: Payer: Self-pay | Admitting: Family Medicine

## 2017-06-06 DIAGNOSIS — Z5181 Encounter for therapeutic drug level monitoring: Secondary | ICD-10-CM

## 2017-06-10 ENCOUNTER — Encounter: Payer: Self-pay | Admitting: Family Medicine

## 2017-06-11 ENCOUNTER — Encounter: Payer: Self-pay | Admitting: Family Medicine

## 2017-06-11 MED ORDER — EMPAGLIFLOZIN 10 MG PO TABS: 10.0000 mg | ORAL_TABLET | Freq: Every day | ORAL | 3 refills | Status: DC

## 2017-06-18 ENCOUNTER — Encounter: Payer: Self-pay | Admitting: Family Medicine

## 2017-06-20 ENCOUNTER — Ambulatory Visit: Payer: 59 | Admitting: Family Medicine

## 2017-06-27 ENCOUNTER — Other Ambulatory Visit: Payer: Self-pay | Admitting: Internal Medicine

## 2017-06-27 ENCOUNTER — Other Ambulatory Visit: Payer: Self-pay | Admitting: Family Medicine

## 2017-06-27 ENCOUNTER — Telehealth: Payer: Self-pay

## 2017-06-27 MED ORDER — ONETOUCH DELICA LANCETS 33G MISC: 1 refills | Status: DC

## 2017-06-27 MED ORDER — INSULIN PEN NEEDLE 32G X 4 MM MISC: 3 refills | Status: DC

## 2017-06-27 MED ORDER — LOSARTAN POTASSIUM-HCTZ 100-12.5 MG PO TABS: 1.0000 | ORAL_TABLET | Freq: Every day | ORAL | 3 refills | Status: DC

## 2017-06-27 NOTE — Telephone Encounter (Signed)
FYI Received fax from [facility] to notify patients medication losartan-hctz was recalled. She was notified by [facility] and they sent her a new rx with a different lot.

## 2017-06-28 NOTE — Telephone Encounter (Signed)
Noted. Please confirm that this is the same medication though not one from the recall.

## 2017-06-28 NOTE — Telephone Encounter (Signed)
Yes [facility] has given patient new rx for same medication, one from a different lot that was not recalled

## 2017-06-29 LAB — HM DIABETES EYE EXAM

## 2017-07-02 ENCOUNTER — Encounter: Payer: Self-pay | Admitting: Family Medicine

## 2017-07-05 ENCOUNTER — Encounter: Payer: Self-pay | Admitting: Family Medicine

## 2017-07-06 NOTE — Telephone Encounter (Signed)
I do not see glimepiride in chart

## 2017-07-08 ENCOUNTER — Encounter: Payer: Self-pay | Admitting: Family Medicine

## 2017-07-09 ENCOUNTER — Encounter: Payer: Self-pay | Admitting: Family Medicine

## 2017-07-10 MED ORDER — GLIMEPIRIDE 2 MG PO TABS: 2.0000 mg | ORAL_TABLET | Freq: Every day | ORAL | 3 refills | Status: DC

## 2017-07-18 ENCOUNTER — Other Ambulatory Visit: Payer: Self-pay | Admitting: Internal Medicine

## 2017-07-18 ENCOUNTER — Other Ambulatory Visit: Payer: Self-pay | Admitting: Family Medicine

## 2017-07-19 ENCOUNTER — Telehealth: Payer: Self-pay | Admitting: Internal Medicine

## 2017-07-19 MED ORDER — OMEPRAZOLE 20 MG PO CPDR: DELAYED_RELEASE_CAPSULE | ORAL | 0 refills | Status: DC

## 2017-07-19 NOTE — Telephone Encounter (Signed)
Patient states she needs medication omeprazole refilled at [facility]. Patient scheduled for ov on 6.5.19.

## 2017-07-19 NOTE — Telephone Encounter (Signed)
Rx Sent  

## 2017-07-25 LAB — HM MAMMOGRAPHY

## 2017-07-25 LAB — HM PAP SMEAR: HM Pap smear: NEGATIVE

## 2017-08-20 ENCOUNTER — Other Ambulatory Visit: Payer: Self-pay | Admitting: Family Medicine

## 2017-08-23 ENCOUNTER — Other Ambulatory Visit: Payer: Self-pay

## 2017-08-23 ENCOUNTER — Ambulatory Visit: Payer: 59 | Admitting: Family Medicine

## 2017-08-23 ENCOUNTER — Encounter: Payer: Self-pay | Admitting: Family Medicine

## 2017-08-23 VITALS — BP 120/70 | HR 80 | Temp 98.1°F | Ht 64.0 in | Wt 161.4 lb

## 2017-08-23 DIAGNOSIS — E1165 Type 2 diabetes mellitus with hyperglycemia: Secondary | ICD-10-CM | POA: Diagnosis not present

## 2017-08-23 DIAGNOSIS — R252 Cramp and spasm: Secondary | ICD-10-CM

## 2017-08-23 DIAGNOSIS — Z1159 Encounter for screening for other viral diseases: Secondary | ICD-10-CM | POA: Diagnosis not present

## 2017-08-23 DIAGNOSIS — Z130 Encounter for screening for diseases of the blood and blood-forming organs and certain disorders involving the immune mechanism: Secondary | ICD-10-CM | POA: Diagnosis not present

## 2017-08-23 DIAGNOSIS — J302 Other seasonal allergic rhinitis: Secondary | ICD-10-CM | POA: Diagnosis not present

## 2017-08-23 DIAGNOSIS — E559 Vitamin D deficiency, unspecified: Secondary | ICD-10-CM

## 2017-08-23 DIAGNOSIS — I10 Essential (primary) hypertension: Secondary | ICD-10-CM

## 2017-08-23 DIAGNOSIS — S2241XA Multiple fractures of ribs, right side, initial encounter for closed fracture: Secondary | ICD-10-CM

## 2017-08-23 DIAGNOSIS — E039 Hypothyroidism, unspecified: Secondary | ICD-10-CM | POA: Diagnosis not present

## 2017-08-23 DIAGNOSIS — M810 Age-related osteoporosis without current pathological fracture: Secondary | ICD-10-CM | POA: Diagnosis not present

## 2017-08-23 NOTE — Assessment & Plan Note (Signed)
Check A1c.  Continue regimen.

## 2017-08-23 NOTE — Patient Instructions (Signed)
Nice to see you. We will check labs today and contact you with the results.  

## 2017-08-23 NOTE — Assessment & Plan Note (Signed)
This pain has been improving.  She will continue to monitor.

## 2017-08-23 NOTE — Assessment & Plan Note (Signed)
At goal. Continue current regimen. 

## 2017-08-23 NOTE — Progress Notes (Signed)
Tommi Rumps, MD Phone: [phone]  Whitney Knight is a 67 y.o. female who presents today for f/u.  DIABETES Disease Monitoring: Blood Sugar ranges-78-115      Optho- UTD Medications: Compliance- taking metformin, victoza, glimeperide 2 mg daily Hypoglycemic symptoms- no  HYPERTENSION  Disease Monitoring  Home BP Monitoring controlled Chest pain- no    Dyspnea- no Medications  Compliance-  Taking losartan/hctz.   Edema- no  She does note some mild leg cramps at times.  Had not had them in a long time.  She started taking potassium supplementation for this.  She does not drink very much water though has increased this.  Allergic rhinitis: Has postnasal drip and rhinorrhea with the pollen outside.  She uses Flonase.  Zyrtec has been helpful though it makes her sleepy.  Rib fractures: Notes this is improved quite a bit.  She does note some soreness.  Her gynecologist mentioned treatment for osteoporosis.  She does not take calcium supplementation.  She does take 2000 international units of vitamin D daily.     Social History   Tobacco Use  Smoking Status Never Smoker  Smokeless Tobacco Never Used     ROS see history of present illness  Objective  Physical Exam Vitals:   08/23/17 0802  BP: 120/70  Pulse: 80  Temp: 98.1 F (36.7 C)  SpO2: 96%    BP Readings from Last 3 Encounters:  08/23/17 120/70  05/15/17 (!) 144/88  05/15/17 140/80   Wt Readings from Last 3 Encounters:  08/23/17 161 lb 6.4 oz (73.2 kg)  05/15/17 162 lb (73.5 kg)  05/15/17 162 lb 12.8 oz (73.8 kg)    Physical Exam  Constitutional: No distress.  HENT:  Mouth/Throat: Oropharynx is clear and moist. No oropharyngeal exudate.  Cardiovascular: Normal rate, regular rhythm and normal heart sounds.  Pulmonary/Chest: Effort normal and breath sounds normal.  No rib tenderness on the right lower anterior ribs in the area of her prior fractures  Musculoskeletal: She exhibits no edema.    Neurological: She is alert.  Skin: Skin is warm and dry. She is not diaphoretic.     Assessment/Plan: Please see individual problem list.  Hypertension At goal.  Continue current regimen.  Hypothyroidism Check TSH.  Diabetes mellitus type 2, uncontrolled Check A1c.  Continue regimen.  Vitamin D deficiency Check vitamin D.  Discussed appropriate vitamin D and calcium supplementation.  Seasonal allergies She can trial Allegra to see if that is less drowsy for her.  Continue Flonase.  Closed rib fracture This pain has been improving.  She will continue to monitor.  Cramps of lower extremity Encouraged hydration.  Check electrolytes.  Age-related osteoporosis without current pathological fracture Check DEXA scan.   Health Maintenance: We have requested mammogram and Pap smear results.  She deferred tetanus vaccination today.  Orders Placed This Encounter  Procedures  . DG Bone Density    Standing Status:   Future    Standing Expiration Date:   10/24/2018    Order Specific Question:   Reason for Exam (SYMPTOM  OR DIAGNOSIS REQUIRED)    Answer:   osteoporosis follow-up    Order Specific Question:   Preferred imaging location?    Answer:   [facility]  . Hepatitis C Antibody  . CBC  . Comp Met (CMET)  . HgB A1c  . TSH  . Lipid panel  . Vitamin D (25 hydroxy)    No orders of the defined types were placed in this encounter.  Tommi Rumps, MD [place]

## 2017-08-23 NOTE — Assessment & Plan Note (Signed)
Check TSH 

## 2017-08-23 NOTE — Assessment & Plan Note (Signed)
Encouraged hydration.  Check electrolytes.

## 2017-08-23 NOTE — Assessment & Plan Note (Signed)
Check vitamin D.  Discussed appropriate vitamin D and calcium supplementation.

## 2017-08-23 NOTE — Assessment & Plan Note (Signed)
Check DEXA scan.  

## 2017-08-23 NOTE — Assessment & Plan Note (Signed)
She can trial Allegra to see if that is less drowsy for her.  Continue Flonase.

## 2017-08-24 LAB — LIPID PANEL
CHOL/HDL RATIO: 2.7 ratio (ref 0.0–4.4)
Cholesterol, Total: 169 mg/dL (ref 100–199)
HDL: 62 mg/dL (ref >39)
LDL CALC: 89 mg/dL (ref 0–99)
Triglycerides: 90 mg/dL (ref 0–149)
VLDL CHOLESTEROL CAL: 18 mg/dL (ref 5–40)

## 2017-08-24 LAB — HEMOGLOBIN A1C
Est. average glucose Bld gHb Est-mCnc: 163 mg/dL
Hgb A1c MFr Bld: 7.3 % — ABNORMAL HIGH (ref 4.8–5.6)

## 2017-08-24 LAB — COMPREHENSIVE METABOLIC PANEL
A/G RATIO: 1.6 (ref 1.2–2.2)
ALBUMIN: 4 g/dL (ref 3.6–4.8)
ALT: 21 IU/L (ref 0–32)
AST: 34 IU/L (ref 0–40)
Alkaline Phosphatase: 114 IU/L (ref 39–117)
BILIRUBIN TOTAL: 0.4 mg/dL (ref 0.0–1.2)
BUN / CREAT RATIO: 12 (ref 12–28)
BUN: 11 mg/dL (ref 8–27)
CALCIUM: 9.2 mg/dL (ref 8.7–10.3)
CO2: 26 mmol/L (ref 20–29)
Chloride: 98 mmol/L (ref 96–106)
Creatinine, Ser: 0.95 mg/dL (ref 0.57–1.00)
GFR, EST AFRICAN AMERICAN: 73 mL/min/{1.73_m2} (ref >59)
GFR, EST NON AFRICAN AMERICAN: 63 mL/min/{1.73_m2} (ref >59)
Globulin, Total: 2.5 g/dL (ref 1.5–4.5)
Glucose: 102 mg/dL — ABNORMAL HIGH (ref 65–99)
POTASSIUM: 4.7 mmol/L (ref 3.5–5.2)
SODIUM: 138 mmol/L (ref 134–144)
TOTAL PROTEIN: 6.5 g/dL (ref 6.0–8.5)

## 2017-08-24 LAB — CBC
HEMATOCRIT: 32.2 % — AB (ref 34.0–46.6)
Hemoglobin: 10.2 g/dL — ABNORMAL LOW (ref 11.1–15.9)
MCH: 24.9 pg — AB (ref 26.6–33.0)
MCHC: 31.7 g/dL (ref 31.5–35.7)
MCV: 79 fL (ref 79–97)
Platelets: 229 10*3/uL (ref 150–379)
RBC: 4.1 x10E6/uL (ref 3.77–5.28)
RDW: 15.4 % (ref 12.3–15.4)
WBC: 6.4 10*3/uL (ref 3.4–10.8)

## 2017-08-24 LAB — VITAMIN D 25 HYDROXY (VIT D DEFICIENCY, FRACTURES): VIT D 25 HYDROXY: 78.2 ng/mL (ref 30.0–100.0)

## 2017-08-24 LAB — HEPATITIS C ANTIBODY: Hep C Virus Ab: <0.1 s/co ratio (ref 0.0–0.9)

## 2017-08-24 LAB — TSH: TSH: 2.38 u[IU]/mL (ref 0.450–4.500)

## 2017-08-27 ENCOUNTER — Telehealth: Payer: Self-pay

## 2017-08-27 DIAGNOSIS — D649 Anemia, unspecified: Secondary | ICD-10-CM

## 2017-08-27 NOTE — Telephone Encounter (Signed)
-----   Message from Leone Haven, MD sent at 08/26/2017 12:47 PM EDT ----- Please let the patient know it appears she is slightly anemic.  Please see if she has been bleeding from anywhere.  We will need to recheck this in the next 1 to 2 weeks.  Please place an order for a CBC and iron, TIBC, and ferritin with a diagnosis of anemia.  Her A1c is essentially stable at 7.3.  I would like her to be less than 7.  The options at this point would be adding additional medication or having her work on dietary changes and exercise and rechecking in 3 months.  Please see what she would prefer to do.  Her other lab work is acceptable.  Thanks.

## 2017-08-29 ENCOUNTER — Encounter: Payer: Self-pay | Admitting: Family Medicine

## 2017-09-04 ENCOUNTER — Encounter: Payer: Self-pay | Admitting: Family Medicine

## 2017-09-05 NOTE — Telephone Encounter (Signed)
Dexa scan has been ordered. Could you get this set up for the patient? Thanks.

## 2017-09-12 ENCOUNTER — Other Ambulatory Visit (INDEPENDENT_AMBULATORY_CARE_PROVIDER_SITE_OTHER): Payer: 59

## 2017-09-12 DIAGNOSIS — D649 Anemia, unspecified: Secondary | ICD-10-CM | POA: Diagnosis not present

## 2017-09-12 NOTE — Addendum Note (Signed)
Addended by: Arby Barrette on: 09/12/2017 08:14 AM   Modules accepted: Orders

## 2017-09-12 NOTE — Addendum Note (Signed)
Addended by: Arby Barrette on: 09/12/2017 08:10 AM   Modules accepted: Orders

## 2017-09-13 ENCOUNTER — Telehealth: Payer: Self-pay

## 2017-09-13 ENCOUNTER — Other Ambulatory Visit: Payer: Self-pay | Admitting: Family Medicine

## 2017-09-13 DIAGNOSIS — D509 Iron deficiency anemia, unspecified: Secondary | ICD-10-CM

## 2017-09-13 LAB — CBC
Hematocrit: 30.7 % — ABNORMAL LOW (ref 34.0–46.6)
Hemoglobin: 9.9 g/dL — ABNORMAL LOW (ref 11.1–15.9)
MCH: 24.9 pg — AB (ref 26.6–33.0)
MCHC: 32.2 g/dL (ref 31.5–35.7)
MCV: 77 fL — ABNORMAL LOW (ref 79–97)
PLATELETS: 267 10*3/uL (ref 150–379)
RBC: 3.97 x10E6/uL (ref 3.77–5.28)
RDW: 16.1 % — ABNORMAL HIGH (ref 12.3–15.4)
WBC: 6.2 10*3/uL (ref 3.4–10.8)

## 2017-09-13 LAB — IRON,TIBC AND FERRITIN PANEL
Ferritin: 7 ng/mL — ABNORMAL LOW (ref 15–150)
IRON SATURATION: 6 % — AB (ref 15–55)
IRON: 24 ug/dL — AB (ref 27–139)
TIBC: 389 ug/dL (ref 250–450)
UIBC: 365 ug/dL (ref 118–369)

## 2017-09-13 MED ORDER — FERROUS SULFATE 325 (65 FE) MG PO TABS: 325.0000 mg | ORAL_TABLET | Freq: Every day | ORAL | 1 refills | Status: DC

## 2017-09-13 NOTE — Telephone Encounter (Signed)
Stool cards given to patients husband

## 2017-09-13 NOTE — Telephone Encounter (Signed)
Copied from [place] [phone]. Topic: General - Other >> Sep 13, 2017  2:24 PM Bea Graff, NT wrote: Reason for CRM: Karmin Kasprzak will be picking up patients forms today. Pt okayed this.

## 2017-09-13 NOTE — Progress Notes (Signed)
fobt

## 2017-09-17 ENCOUNTER — Encounter: Payer: Self-pay | Admitting: Internal Medicine

## 2017-09-17 ENCOUNTER — Telehealth: Payer: Self-pay | Admitting: Internal Medicine

## 2017-09-17 ENCOUNTER — Other Ambulatory Visit: Payer: Self-pay | Admitting: Family Medicine

## 2017-09-17 DIAGNOSIS — D509 Iron deficiency anemia, unspecified: Secondary | ICD-10-CM

## 2017-09-17 NOTE — Telephone Encounter (Signed)
Pt is requesting refill for omeprazole. She had an appt scheduled with Dr. Hilarie Fredrickson for 10/03/17 but just cancelled due to work conflict. Pt was not able to r/s appt because Dr. Hilarie Fredrickson is fully booked and his schedule for August is not yet available.

## 2017-09-19 NOTE — Telephone Encounter (Signed)
Unfortunately we have not seen patient since 05/2015 and have also been requesting her to schedule an appointment since 01/2017. We are unable to refill medications until we see her in office but her medication is readily available over the counter. I have gone ahead and scheduled her for 11/26/17 at 9:00 am while I do have a limited schedule availability. I have left a voicemail with this information for the patient since she was unavailable.

## 2017-09-21 ENCOUNTER — Other Ambulatory Visit (INDEPENDENT_AMBULATORY_CARE_PROVIDER_SITE_OTHER): Payer: 59

## 2017-09-21 DIAGNOSIS — D509 Iron deficiency anemia, unspecified: Secondary | ICD-10-CM | POA: Diagnosis not present

## 2017-09-21 LAB — FECAL OCCULT BLOOD, IMMUNOCHEMICAL: Fecal Occult Bld: NEGATIVE

## 2017-09-26 ENCOUNTER — Encounter: Payer: Self-pay | Admitting: Family Medicine

## 2017-10-03 ENCOUNTER — Ambulatory Visit: Payer: 59 | Admitting: Internal Medicine

## 2017-10-08 ENCOUNTER — Other Ambulatory Visit: Payer: 59

## 2017-10-10 ENCOUNTER — Other Ambulatory Visit (INDEPENDENT_AMBULATORY_CARE_PROVIDER_SITE_OTHER): Payer: 59

## 2017-10-10 DIAGNOSIS — D509 Iron deficiency anemia, unspecified: Secondary | ICD-10-CM | POA: Diagnosis not present

## 2017-10-10 NOTE — Addendum Note (Signed)
Addended by: Arby Barrette on: 10/10/2017 07:57 AM   Modules accepted: Orders

## 2017-10-10 NOTE — Addendum Note (Signed)
Addended by: Arby Barrette on: 10/10/2017 08:04 AM   Modules accepted: Orders

## 2017-10-11 LAB — IRON,TIBC AND FERRITIN PANEL
FERRITIN: 14 ng/mL — AB (ref 15–150)
IRON SATURATION: 52 % (ref 15–55)
Iron: 194 ug/dL — ABNORMAL HIGH (ref 27–139)
TIBC: 374 ug/dL (ref 250–450)
UIBC: 180 ug/dL (ref 118–369)

## 2017-10-11 LAB — CBC
HEMATOCRIT: 32.1 % — AB (ref 34.0–46.6)
HEMOGLOBIN: 10.2 g/dL — AB (ref 11.1–15.9)
MCH: 24.8 pg — ABNORMAL LOW (ref 26.6–33.0)
MCHC: 31.8 g/dL (ref 31.5–35.7)
MCV: 78 fL — AB (ref 79–97)
Platelets: 243 10*3/uL (ref 150–450)
RBC: 4.12 x10E6/uL (ref 3.77–5.28)
RDW: 17.7 % — ABNORMAL HIGH (ref 12.3–15.4)
WBC: 6.6 10*3/uL (ref 3.4–10.8)

## 2017-10-12 ENCOUNTER — Telehealth: Payer: Self-pay

## 2017-10-12 DIAGNOSIS — D509 Iron deficiency anemia, unspecified: Secondary | ICD-10-CM

## 2017-10-12 NOTE — Telephone Encounter (Signed)
-----   Message from Leone Haven, MD sent at 10/12/2017  1:47 PM EDT ----- Noted.  Did they set her up for a urinalysis?  If they did not please order a point-of-care urinalysis for iron deficiency anemia and get her set up for an appointment for this to be collected.  Thanks.

## 2017-10-15 ENCOUNTER — Other Ambulatory Visit (INDEPENDENT_AMBULATORY_CARE_PROVIDER_SITE_OTHER): Payer: 59

## 2017-10-15 DIAGNOSIS — D509 Iron deficiency anemia, unspecified: Secondary | ICD-10-CM | POA: Diagnosis not present

## 2017-10-15 DIAGNOSIS — Z5181 Encounter for therapeutic drug level monitoring: Secondary | ICD-10-CM

## 2017-10-15 LAB — POCT URINALYSIS DIPSTICK
BILIRUBIN UA: NEGATIVE
Blood, UA: NEGATIVE
GLUCOSE UA: NEGATIVE
KETONES UA: NEGATIVE
Leukocytes, UA: NEGATIVE
Nitrite, UA: NEGATIVE
Protein, UA: NEGATIVE
SPEC GRAV UA: 1.01 (ref 1.010–1.025)
Urobilinogen, UA: 0.2 E.U./dL
pH, UA: 6 (ref 5.0–8.0)

## 2017-10-15 NOTE — Addendum Note (Signed)
Addended by: Leeanne Rio on: 10/15/2017 08:48 AM   Modules accepted: Orders

## 2017-10-25 ENCOUNTER — Ambulatory Visit
Admission: RE | Admit: 2017-10-25 | Discharge: 2017-10-25 | Disposition: A | Discharge status: Home / Self Care | Payer: 59 | Source: Ambulatory Visit | Attending: Family Medicine | Admitting: Family Medicine

## 2017-10-25 DIAGNOSIS — M810 Age-related osteoporosis without current pathological fracture: Secondary | ICD-10-CM | POA: Diagnosis present

## 2017-10-27 ENCOUNTER — Encounter: Payer: Self-pay | Admitting: Family Medicine

## 2017-10-29 ENCOUNTER — Encounter: Payer: Self-pay | Admitting: Family Medicine

## 2017-10-30 ENCOUNTER — Encounter: Payer: Self-pay | Admitting: Family Medicine

## 2017-10-30 ENCOUNTER — Other Ambulatory Visit: Payer: Self-pay | Admitting: Family Medicine

## 2017-10-30 DIAGNOSIS — M810 Age-related osteoporosis without current pathological fracture: Secondary | ICD-10-CM

## 2017-10-30 NOTE — Progress Notes (Signed)
rheum 

## 2017-10-30 NOTE — Telephone Encounter (Signed)
I will forward to Whitney Knight to start working on getting this patient approved for Prolia for her osteoporosis.  She has had issues swallowing in the past and oral bisphosphonates would not be a great option.

## 2017-11-04 ENCOUNTER — Other Ambulatory Visit: Payer: Self-pay | Admitting: Family Medicine

## 2017-11-05 ENCOUNTER — Encounter: Payer: Self-pay | Admitting: Family Medicine

## 2017-11-05 ENCOUNTER — Telehealth: Payer: Self-pay | Admitting: *Deleted

## 2017-11-05 NOTE — Telephone Encounter (Signed)
Prolia

## 2017-11-07 ENCOUNTER — Encounter: Payer: Self-pay | Admitting: Family Medicine

## 2017-11-12 ENCOUNTER — Encounter: Payer: Self-pay | Admitting: Family Medicine

## 2017-11-14 DIAGNOSIS — M810 Age-related osteoporosis without current pathological fracture: Secondary | ICD-10-CM | POA: Diagnosis not present

## 2017-11-14 DIAGNOSIS — Z8719 Personal history of other diseases of the digestive system: Secondary | ICD-10-CM | POA: Diagnosis not present

## 2017-11-21 NOTE — Telephone Encounter (Signed)
In process 

## 2017-11-26 ENCOUNTER — Ambulatory Visit (INDEPENDENT_AMBULATORY_CARE_PROVIDER_SITE_OTHER): Payer: Medicare Other | Admitting: Internal Medicine

## 2017-11-26 ENCOUNTER — Encounter: Payer: Self-pay | Admitting: Internal Medicine

## 2017-11-26 VITALS — BP 124/70 | HR 76 | Ht 63.0 in | Wt 164.4 lb

## 2017-11-26 DIAGNOSIS — D509 Iron deficiency anemia, unspecified: Secondary | ICD-10-CM

## 2017-11-26 DIAGNOSIS — K219 Gastro-esophageal reflux disease without esophagitis: Secondary | ICD-10-CM | POA: Diagnosis not present

## 2017-11-26 DIAGNOSIS — Z79899 Other long term (current) drug therapy: Secondary | ICD-10-CM | POA: Diagnosis not present

## 2017-11-26 DIAGNOSIS — M810 Age-related osteoporosis without current pathological fracture: Secondary | ICD-10-CM | POA: Diagnosis not present

## 2017-11-26 MED ORDER — RANITIDINE HCL 150 MG PO TABS: 150.0000 mg | ORAL_TABLET | Freq: Two times a day (BID) | ORAL | 0 refills | Status: DC

## 2017-11-26 MED ORDER — SUPREP BOWEL PREP KIT 17.5-3.13-1.6 GM/177ML PO SOLN: 1.0000 | ORAL | 0 refills | Status: DC

## 2017-11-26 NOTE — Patient Instructions (Signed)
You have been scheduled for an endoscopy and colonoscopy. Please follow the written instructions given to you at your visit today. Please pick up your prep supplies at the pharmacy within the next 1-3 days. If you use inhalers (even only as needed), please bring them with you on the day of your procedure. Your physician has requested that you go to www.startemmi.com and enter the access code given to you at your visit today. This web site gives a general overview about your procedure. However, you should still follow specific instructions given to you by our office regarding your preparation for the procedure.  Discontinue omeprazole.   Please purchase the following medications over the counter and take as directed: Zantac 150 mg twice daily  If you are age 65 or older, your body mass index should be between 23-30. Your Body mass index is 29.12 kg/m. If this is out of the aforementioned range listed, please consider follow up with your Primary Care Provider.  If you are age 15 or younger, your body mass index should be between 19-25. Your Body mass index is 29.12 kg/m. If this is out of the aformentioned range listed, please consider follow up with your Primary Care Provider.

## 2017-11-26 NOTE — Telephone Encounter (Signed)
We do not need to order this.  The patient has been referred to endocrinology and has seen them and is undergoing treatment through them.

## 2017-11-26 NOTE — Telephone Encounter (Signed)
Pharmacist, community for [facility] on [address]. 11/12/17. I have received verification of Prolia from patient insurance, cost will be 20% plus injection fee with 185 deductible. OK to schedule patient?

## 2017-11-26 NOTE — Progress Notes (Signed)
Patient ID: Whitney Knight, female   DOB: Apr 03, 1951, 67 y.o.   MRN: [id number] HPI: Whitney Knight is a 67 year old female with a history of GERD, esophageal dysphagia responsive to dilation, colonic diverticulosis, diabetes, hypertension, hypothyroidism, recent diagnosis of osteoporosis who was seen in consultation at the request of Dr. [name] to evaluate IDA.  She is known to me from being seen to evaluate  dysphagia.  She underwent an upper endoscopy on 05/18/2015 which was largely unremarkable.  The esophagus was dilated on that day using a Savary dilator to 17 mm.  She had good result after this.  She has been on omeprazole 20 mg twice daily.  She also had a colonoscopy without polyps in 2014 with Dr. Sharlett Iles.  She states that she was diagnosed with an iron deficiency anemia earlier this year by primary care at her annual physical.  She has had no issues with visible blood in her stool or melena.  This is a new issue for her.  She does eat a fairly low red meat diet but is not vegetarian.  She did have a negative Hemoccult test by primary care.  For the last 30 to 40 days she is been on oral iron once daily.  Initially this caused loose stools but she is now tolerating this well.  She denies change in her bowel habit, issues with diarrhea or constipation recently.  No abdominal pain.  Again no visible bleeding.  Swallowing has been improved since dilation in 2017.  She does not have true heartburn symptoms.  She does notice cough and some regurgitation if she does not take omeprazole.   She had a fall in January in the [place] parking lot when she stumbled on a rock.  This led to 4 rib fractures on her right side.  She was then evaluated and diagnosed with osteoporosis, saw endocrinology but did not want to take Fosamax with her esophageal history.  She subsequently seen Dr. Jefm Bryant with rheumatology in [place] and is scheduled for Reclast next month.  There is no family history of GI tract  malignancy.  Past Medical History:  Diagnosis Date  . Allergy   . Chronic sinusitis   . Cough   . Diabetes mellitus   . Diverticulosis   . Headache(784.0)    [facility] in past  . Hypertension   . Osteoporosis   . Thyroid disease     Past Surgical History:  Procedure Laterality Date  . bone graft     for dental surgery  . CARPAL TUNNEL RELEASE Right 02/27/2013   [facility]  . COLONOSCOPY  2014  . dental implants    . DENTAL SURGERY    . NASAL SINUS SURGERY  06/2008  . TRIGGER FINGER RELEASE Right 02/27/2013   [facility]  . VAGINAL DELIVERY     2    Outpatient Medications Prior to Visit  Medication Sig Dispense Refill  . aspirin 81 MG tablet Take 81 mg by mouth daily.      . Betamethasone Valerate 0.12 % foam Apply to affected area once daily for up to 7 days 100 g 0  . Casanthranol-Docusate Sodium 30-100 MG CAPS Take 1 tablet by mouth daily. 90 each 3  . cholecalciferol (VITAMIN D) 1000 UNITS tablet Take 2,000 Units by mouth daily.     . ferrous sulfate 325 (65 FE) MG tablet TAKE 1 TABLET BY MOUTH EVERY DAY WITH BREAKFAST 30 tablet 1  . fluticasone (FLONASE) 50 MCG/ACT nasal spray USE 2 SPRAYS IN  EACH  NOSTRIL DAILY 48 g 1  . glimepiride (AMARYL) 2 MG tablet Take 1 tablet (2 mg total) by mouth daily with breakfast. 30 tablet 3  . Insulin Pen Needle (BD PEN NEEDLE NANO U/F) 32G X 4 MM MISC Inject daily as directed 90 each 3  . levothyroxine (SYNTHROID, LEVOTHROID) 75 MCG tablet TAKE 1 TABLET BY MOUTH  DAILY 90 tablet 3  . losartan-hydrochlorothiazide (HYZAAR) 100-12.5 MG tablet Take 1 tablet by mouth daily. 90 tablet 3  . metFORMIN (GLUCOPHAGE) 1000 MG tablet TAKE ONE-HALF TABLET BY  MOUTH 2 TIMES DAILY WITH A  MEAL. NEEDS APPT WITH PCP  FOR FURTHER REFILLS (Patient taking differently: Take one tablets twice a day) 90 tablet 1  . ONE TOUCH ULTRA TEST test strip USE AS INSTRUCTED TO TEST  BLOOD SUGAR TWICE DAILY. 200 each 2  . ONETOUCH DELICA LANCETS 16X MISC USE 2-3 TIMES  DAILY AS  DIRECTED 300 each 1  . oxybutynin (DITROPAN-XL) 5 MG 24 hr tablet TAKE 1 TABLET BY MOUTH  DAILY 90 tablet 1  . POTASSIUM CHLORIDE PO Take by mouth. Taking OTC, unsure mg    . Probiotic Product (PROBIOTIC DAILY PO) Take by mouth.    [name] VICTOZA 18 MG/3ML SOPN INJECT SUBCUTANEOUSLY 1.8MG  DAILY 27 mL 3  . Zoledronic Acid (RECLAST IV) Inject into the vein. Once a year    . omeprazole (PRILOSEC) 20 MG capsule TAKE 1 CAPSULE BY MOUTH TWO TIMES DAILY. NO FURTHER REFILLS UNTIL AFTER OFFICE VISIT 10/03/17 180 capsule 0  . HYDROcodone-acetaminophen (NORCO) 5-325 MG tablet Take 1 tablet by mouth every 6 (six) hours as needed. 15 tablet 0   No facility-administered medications prior to visit.     Allergies  Allergen Reactions  . Clarithromycin     REACTION: Nausea  . Jardiance [Empagliflozin] Nausea And Vomiting  . Penicillins     REACTION: Throat swelling    Family History  Problem Relation Age of Onset  . COPD Father   . Hypertension Father   . Heart disease Father   . Heart disease Mother   . Diabetes Maternal Grandmother     Social History   Tobacco Use  . Smoking status: Never Smoker  . Smokeless tobacco: Never Used  Substance Use Topics  . Alcohol use: Yes    Comment: occ  . Drug use: No    ROS: As per history of present illness, otherwise negative  BP 124/70 (BP Location: Left Arm, Patient Position: Sitting, Cuff Size: Normal)   Pulse 76   Ht 5\' 3"  (1.6 m) Comment: height measured without shoes  Wt 164 lb 6 oz (74.6 kg)   BMI 29.12 kg/m  Constitutional: Well-developed and well-nourished. No distress. HEENT: Normocephalic and atraumatic. Conjunctivae are normal.  No scleral icterus. Neck: Neck supple. Trachea midline. Cardiovascular: Normal rate, regular rhythm and intact distal pulses. No M/R/G Pulmonary/chest: Effort normal and breath sounds normal. No wheezing, rales or rhonchi. Abdominal: Soft, nontender, nondistended. Bowel sounds active throughout. There  are no masses palpable. No hepatosplenomegaly. Extremities: no clubbing, cyanosis, or edema Neurological: Alert and oriented to person place and time. Skin: Skin is warm and dry.  Psychiatric: Normal mood and affect. Behavior is normal.  RELEVANT LABS AND IMAGING: CBC    Component Value Date/Time   WBC 6.6 10/10/2017 0804   RBC 4.12 10/10/2017 0804   HGB 10.2 (L) 10/10/2017 0804   HCT 32.1 (L) 10/10/2017 0804   PLT 243 10/10/2017 0804   MCV 78 (L) 10/10/2017  0804   MCH 24.8 (L) 10/10/2017 0804   MCHC 31.8 10/10/2017 0804   RDW 17.7 (H) 10/10/2017 0804   LYMPHSABS 1.5 05/13/2012 0927   EOSABS 0.3 05/13/2012 0927   BASOSABS 0.0 05/13/2012 0927    CMP     Component Value Date/Time   NA 138 08/23/2017 0826   K 4.7 08/23/2017 0826   CL 98 08/23/2017 0826   CO2 26 08/23/2017 0826   GLUCOSE 102 (H) 08/23/2017 0826   BUN 11 08/23/2017 0826   CREATININE 0.95 08/23/2017 0826   CALCIUM 9.2 08/23/2017 0826   PROT 6.5 08/23/2017 0826   ALBUMIN 4.0 08/23/2017 0826   AST 34 08/23/2017 0826   ALT 21 08/23/2017 0826   ALKPHOS 114 08/23/2017 0826   BILITOT 0.4 08/23/2017 0826   GFRNONAA 63 08/23/2017 0826   GFRAA 73 08/23/2017 0826   Iron/TIBC/Ferritin/ %Sat    Component Value Date/Time   IRON 194 (H) 10/10/2017 0804   TIBC 374 10/10/2017 0804   FERRITIN 14 (L) 10/10/2017 0804   IRONPCTSAT 52 10/10/2017 0804    ASSESSMENT/PLAN:  67 year old female with a history of GERD, esophageal dysphagia responsive to dilation, colonic diverticulosis, diabetes, hypertension, hypothyroidism, recent diagnosis of osteoporosis who was seen in consultation at the request of Dr. [name] to evaluate IDA.   1. IDA --new issue for her and her blood count declined to as low as 9.9.  She is been on oral iron for about a month.  No visible bleeding.  We discussed this issue would need to rule out celiac disease and GI blood loss.  I recommended upper endoscopy and colonoscopy.  We discussed the risk,  benefits and alternatives and she is agreeable and wishes to proceed.  If the studies are negative my recommendation would be for video capsule endoscopy thereafter.  Continue oral iron and follow-up response to oral iron by following CBC and iron studies by primary care.  2.  GERD/history of dysphagia/osteoporosis--responsive to dilation.  We discussed that PPI medication can be associated with osteoporosis and interfere with calcium and magnesium metabolism.  she does have reflux disease but may be able to have symptoms well controlled with H2 blocker which should interfere less with calcium, magnesium and therefore osteoporosis.  We will change her omeprazole to ranitidine 150 mg twice daily.  She is asked to notify me if reflux or swallowing trouble returns when changing from PPI to H2 blocker   Cc:Whitney [place], Md [address] [place], [place] [address]

## 2017-11-27 ENCOUNTER — Other Ambulatory Visit: Payer: Self-pay

## 2017-11-27 MED ORDER — RANITIDINE HCL 150 MG PO TABS: 150.0000 mg | ORAL_TABLET | Freq: Two times a day (BID) | ORAL | 1 refills | Status: DC

## 2017-11-27 NOTE — Telephone Encounter (Signed)
D/C order

## 2017-11-27 NOTE — Progress Notes (Addendum)
rec'd fax from [facility] requesting clarification on quantity for ranitidine 150mg .  Rx was sent yesterday as #1. Resent as #180. Pt taking bid.

## 2017-11-30 ENCOUNTER — Encounter: Payer: Self-pay | Admitting: Family Medicine

## 2017-11-30 MED ORDER — GLIMEPIRIDE 2 MG PO TABS: 2.0000 mg | ORAL_TABLET | Freq: Every day | ORAL | 3 refills | Status: DC

## 2017-12-01 ENCOUNTER — Other Ambulatory Visit: Payer: Self-pay | Admitting: Family Medicine

## 2017-12-03 ENCOUNTER — Encounter: Payer: Self-pay | Admitting: Family Medicine

## 2017-12-04 ENCOUNTER — Encounter: Payer: Self-pay | Admitting: Family Medicine

## 2017-12-09 ENCOUNTER — Encounter: Payer: Self-pay | Admitting: Family Medicine

## 2017-12-10 ENCOUNTER — Encounter: Payer: Self-pay | Admitting: Family Medicine

## 2017-12-10 MED ORDER — OXYBUTYNIN CHLORIDE ER 5 MG PO TB24: 5.0000 mg | ORAL_TABLET | Freq: Every day | ORAL | 1 refills | Status: DC

## 2017-12-10 MED ORDER — LIRAGLUTIDE 18 MG/3ML ~~LOC~~ SOPN: PEN_INJECTOR | SUBCUTANEOUS | 3 refills | Status: DC

## 2017-12-10 MED ORDER — METFORMIN HCL 1000 MG PO TABS: 1000.0000 mg | ORAL_TABLET | Freq: Two times a day (BID) | ORAL | 1 refills | Status: DC

## 2017-12-14 DIAGNOSIS — M810 Age-related osteoporosis without current pathological fracture: Secondary | ICD-10-CM | POA: Diagnosis not present

## 2017-12-21 ENCOUNTER — Encounter: Payer: Self-pay | Admitting: Family Medicine

## 2017-12-21 ENCOUNTER — Other Ambulatory Visit: Payer: Self-pay

## 2017-12-21 MED ORDER — GLUCOSE BLOOD VI STRP: ORAL_STRIP | 2 refills | Status: DC

## 2017-12-24 NOTE — Telephone Encounter (Signed)
Please have pt increase ranitidine to 300 mg every 12 hours to see if heartburn and GERD come under control If not she needs to let me know Thanks

## 2017-12-25 ENCOUNTER — Other Ambulatory Visit: Payer: Self-pay | Admitting: Family Medicine

## 2018-01-10 ENCOUNTER — Encounter: Payer: Self-pay | Admitting: Internal Medicine

## 2018-01-10 ENCOUNTER — Encounter

## 2018-01-10 ENCOUNTER — Ambulatory Visit (AMBULATORY_SURGERY_CENTER): Payer: Medicare Other | Admitting: Internal Medicine

## 2018-01-10 VITALS — BP 125/59 | HR 66 | Temp 98.9°F | Resp 11 | Ht 63.0 in | Wt 164.0 lb

## 2018-01-10 DIAGNOSIS — D123 Benign neoplasm of transverse colon: Secondary | ICD-10-CM

## 2018-01-10 DIAGNOSIS — K219 Gastro-esophageal reflux disease without esophagitis: Secondary | ICD-10-CM | POA: Diagnosis not present

## 2018-01-10 DIAGNOSIS — R1310 Dysphagia, unspecified: Secondary | ICD-10-CM

## 2018-01-10 DIAGNOSIS — R1319 Other dysphagia: Secondary | ICD-10-CM

## 2018-01-10 DIAGNOSIS — D509 Iron deficiency anemia, unspecified: Secondary | ICD-10-CM | POA: Diagnosis not present

## 2018-01-10 DIAGNOSIS — D508 Other iron deficiency anemias: Secondary | ICD-10-CM | POA: Diagnosis not present

## 2018-01-10 DIAGNOSIS — K2950 Unspecified chronic gastritis without bleeding: Secondary | ICD-10-CM | POA: Diagnosis not present

## 2018-01-10 DIAGNOSIS — D125 Benign neoplasm of sigmoid colon: Secondary | ICD-10-CM | POA: Diagnosis not present

## 2018-01-10 MED ORDER — RANITIDINE HCL 150 MG PO TABS: 300.0000 mg | ORAL_TABLET | Freq: Every day | ORAL | 6 refills | Status: DC

## 2018-01-10 MED ORDER — SODIUM CHLORIDE 0.9 % IV SOLN: 500.0000 mL | Freq: Once | INTRAVENOUS | Status: DC

## 2018-01-10 MED ORDER — OMEPRAZOLE 40 MG PO CPDR: 40.0000 mg | DELAYED_RELEASE_CAPSULE | Freq: Every day | ORAL | 3 refills | Status: DC

## 2018-01-10 MED ORDER — DEXTROSE 5 % IV SOLN: INTRAVENOUS | Status: DC

## 2018-01-10 NOTE — Op Note (Signed)
[facility] Patient Name: Whitney Knight Procedure Date: 01/10/2018 3:36 PM MRN: [id number] Endoscopist: Jerene Bears , MD Age: 67 Referring MD:  Date of Birth: Apr 12, 1951 Gender: Female Account #: [id number] Procedure:                Upper GI endoscopy Indications:              Iron deficiency anemia, Dysphagia (responsive to                            dilation in Jan 2017), Gastro-esophageal reflux                            disease Medicines:                Monitored Anesthesia Care Procedure:                Pre-Anesthesia Assessment:                           - Prior to the procedure, a History and Physical                            was performed, and patient medications and                            allergies were reviewed. The patient's tolerance of                            previous anesthesia was also reviewed. The risks                            and benefits of the procedure and the sedation                            options and risks were discussed with the patient.                            All questions were answered, and informed consent                            was obtained. Prior Anticoagulants: The patient has                            taken no previous anticoagulant or antiplatelet                            agents. ASA Grade Assessment: II - A patient with                            mild systemic disease. After reviewing the risks                            and benefits, the patient was deemed in  satisfactory condition to undergo the procedure.                           After obtaining informed consent, the endoscope was                            passed under direct vision. Throughout the                            procedure, the patient's blood pressure, pulse, and                            oxygen saturations were monitored continuously. The                            Endoscope was introduced through the mouth, and                           advanced to the second part of duodenum. The upper                            GI endoscopy was accomplished without difficulty.                            The patient tolerated the procedure well. Scope In: Scope Out: Findings:                 The examined esophagus was normal. A guidewire was                            placed and the scope was withdrawn. Dilation was                            performed with a Savary dilator with mild                            resistance at 17 mm.                           Localized mild inflammation characterized by                            erythema was found in the gastric antrum. Biopsies                            were taken with a cold forceps for histology and                            Helicobacter pylori testing.                           The cardia and gastric fundus were normal on                            retroflexion.  The examined duodenum was normal. Biopsies for                            histology were taken with a cold forceps for                            evaluation of celiac disease. Complications:            No immediate complications. Estimated Blood Loss:     Estimated blood loss was minimal. Impression:               - Normal esophagus. Dilated to 17 with Savary.                           - Mild gastritis. Biopsied.                           - Normal examined duodenum. Biopsied. Recommendation:           - Patient has a contact number available for                            emergencies. The signs and symptoms of potential                            delayed complications were discussed with the                            patient. Return to normal activities tomorrow.                            Written discharge instructions were provided to the                            patient.                           - Resume previous diet.                           - Continue present  medications.                           - Add back omeprazole 40 mg each morning given                            non-response to ranitidine at 300 mg twice daily.                            Can continue with ranitidine 300 mg each evening                            (prior to trial of BID H2 blocker, symptoms were                            well controlled on omeprazole 40 mg  BID).                           - Await pathology results.                           - See the other procedure note for documentation of                            additional recommendations. Jerene Bears, MD 01/10/2018 4:09:05 PM This report has been signed electronically.

## 2018-01-10 NOTE — Progress Notes (Signed)
To PACU, VSS. Report to [email or web address] 

## 2018-01-10 NOTE — Patient Instructions (Signed)
Thank you for allowing [place] to care for you today.  Await pathology results by mail, approx 2 weeks.  Next colonoscopy will be determined after pathology results are final.  Resume current diet.    Return to normal activity tomorrow.  Add Back Omeprazole 40 mg by mouth each morning.  Continue Ranitidine 300 mg by mouth each evening.  Continue iron supplementation,  will monitor iron studies.  If persistent or recurrent anemia then video capsule endoscopy is recommended..     YOU HAD AN ENDOSCOPIC PROCEDURE TODAY AT [facility]:   Refer to the procedure report that was given to you for any specific questions about what was found during the examination.  If the procedure report does not answer your questions, please call your gastroenterologist to clarify.  If you requested that your care partner not be given the details of your procedure findings, then the procedure report has been included in a sealed envelope for you to review at your convenience later.  YOU SHOULD EXPECT: Some feelings of bloating in the abdomen. Passage of more gas than usual.  Walking can help get rid of the air that was put into your GI tract during the procedure and reduce the bloating. If you had a lower endoscopy (such as a colonoscopy or flexible sigmoidoscopy) you may notice spotting of blood in your stool or on the toilet paper. If you underwent a bowel prep for your procedure, you may not have a normal bowel movement for a few days.  Please Note:  You might notice some irritation and congestion in your nose or some drainage.  This is from the oxygen used during your procedure.  There is no need for concern and it should clear up in a day or so.  SYMPTOMS TO REPORT IMMEDIATELY:   Following lower endoscopy (colonoscopy or flexible sigmoidoscopy):  Excessive amounts of blood in the stool  Significant tenderness or worsening of abdominal pains  Swelling of the abdomen that is new, acute  Fever of  100F or higher   Following upper endoscopy (EGD)  Vomiting of blood or coffee ground material  New chest pain or pain under the shoulder blades  Painful or persistently difficult swallowing  New shortness of breath  Fever of 100F or higher  Black, tarry-looking stools  For urgent or emergent issues, a gastroenterologist can be reached at any hour by calling [phone].   DIET:  We do recommend a small meal at first, but then you may proceed to your regular diet.  Drink plenty of fluids but you should avoid alcoholic beverages for 24 hours.  ACTIVITY:  You should plan to take it easy for the rest of today and you should NOT DRIVE or use heavy machinery until tomorrow (because of the sedation medicines used during the test).    FOLLOW UP: Our staff will call the number listed on your records the next business day following your procedure to check on you and address any questions or concerns that you may have regarding the information given to you following your procedure. If we do not reach you, we will leave a message.  However, if you are feeling well and you are not experiencing any problems, there is no need to return our call.  We will assume that you have returned to your regular daily activities without incident.  If any biopsies were taken you will be contacted by phone or by letter within the next 1-3 weeks.  Please call us  at [phone] if you have not heard about the biopsies in 3 weeks.    SIGNATURES/CONFIDENTIALITY: You and/or your care partner have signed paperwork which will be entered into your electronic medical record.  These signatures attest to the fact that that the information above on your After Visit Summary has been reviewed and is understood.  Full responsibility of the confidentiality of this discharge information lies with you and/or your care-partner.

## 2018-01-10 NOTE — Op Note (Signed)
[facility] Patient Name: Whitney Knight Procedure Date: 01/10/2018 3:36 PM MRN: [id number] Endoscopist: Jerene Bears , MD Age: 67 Referring MD:  Date of Birth: 20-Jan-1951 Gender: Female Account #: [id number] Procedure:                Colonoscopy Indications:              Iron deficiency anemia Medicines:                Monitored Anesthesia Care Procedure:                Pre-Anesthesia Assessment:                           - Prior to the procedure, a History and Physical                            was performed, and patient medications and                            allergies were reviewed. The patient's tolerance of                            previous anesthesia was also reviewed. The risks                            and benefits of the procedure and the sedation                            options and risks were discussed with the patient.                            All questions were answered, and informed consent                            was obtained. Prior Anticoagulants: The patient has                            taken no previous anticoagulant or antiplatelet                            agents. ASA Grade Assessment: II - A patient with                            mild systemic disease. After reviewing the risks                            and benefits, the patient was deemed in                            satisfactory condition to undergo the procedure.                           After obtaining informed consent, the colonoscope  was passed under direct vision. Throughout the                            procedure, the patient's blood pressure, pulse, and                            oxygen saturations were monitored continuously. The                            Colonoscope was introduced through the anus and                            advanced to the cecum, identified by appendiceal                            orifice and ileocecal valve. The  colonoscopy was                            performed without difficulty. The patient tolerated                            the procedure well. The quality of the bowel                            preparation was good. The ileocecal valve,                            appendiceal orifice, and rectum were photographed. Scope In: 3:48:08 PM Scope Out: 4:04:02 PM Scope Withdrawal Time: 0 hours 12 minutes 20 seconds  Total Procedure Duration: 0 hours 15 minutes 54 seconds  Findings:                 The digital rectal exam was normal.                           Two sessile polyps were found in the sigmoid colon                            and transverse colon. The polyps were 4 to 6 mm in                            size. These polyps were removed with a cold snare.                            Resection and retrieval were complete.                           Multiple medium-mouthed diverticula were found in                            the sigmoid colon and distal descending colon.                           Internal hemorrhoids were found during  retroflexion. The hemorrhoids were small. Complications:            No immediate complications. Estimated Blood Loss:     Estimated blood loss was minimal. Impression:               - Two 4 to 6 mm polyps in the sigmoid colon and in                            the transverse colon, removed with a cold snare.                            Resected and retrieved.                           - Diverticulosis in the sigmoid colon and in the                            distal descending colon.                           - Small internal hemorrhoids. Recommendation:           - Patient has a contact number available for                            emergencies. The signs and symptoms of potential                            delayed complications were discussed with the                            patient. Return to normal activities tomorrow.                             Written discharge instructions were provided to the                            patient.                           - Resume previous diet.                           - Continue present medications.                           - Await pathology results.                           - Continue iron supplementation and close attention                            to iron studies. If persistent or recurrent IDA                            then video capsule endoscopy is recommended.                           -  Repeat colonoscopy is recommended. The                            colonoscopy date will be determined after pathology                            results from today's exam become available for                            review. Jerene Bears, MD 01/10/2018 4:18:04 PM This report has been signed electronically.

## 2018-01-11 ENCOUNTER — Telehealth: Payer: Self-pay

## 2018-01-11 NOTE — Telephone Encounter (Signed)
  Follow up Call-  Call back number 01/10/2018 05/18/2015  Post procedure Call Back phone  # [phone]  Permission to leave phone message Yes Yes  Some recent data might be hidden     Patient questions:  Do you have a fever, pain , or abdominal swelling? No. Pain Score  0 *  Have you tolerated food without any problems? Yes.    Have you been able to return to your normal activities? Yes.    Do you have any questions about your discharge instructions: Diet   No. Medications  No. Follow up visit  No.  Do you have questions or concerns about your Care? Yes.  Pt. Wanted confirmation re: strength of Omeprazole.   Reviewed physician directions that were included in her report.  Actions: * If pain score is 4 or above: No action needed, pain <4.

## 2018-01-15 ENCOUNTER — Encounter: Payer: Self-pay | Admitting: Internal Medicine

## 2018-01-16 ENCOUNTER — Other Ambulatory Visit: Payer: Self-pay | Admitting: Family Medicine

## 2018-01-20 ENCOUNTER — Other Ambulatory Visit: Payer: Self-pay | Admitting: Family Medicine

## 2018-01-21 ENCOUNTER — Encounter: Payer: Self-pay | Admitting: Family Medicine

## 2018-01-25 ENCOUNTER — Ambulatory Visit (INDEPENDENT_AMBULATORY_CARE_PROVIDER_SITE_OTHER): Payer: Medicare Other

## 2018-01-25 DIAGNOSIS — Z23 Encounter for immunization: Secondary | ICD-10-CM

## 2018-02-07 ENCOUNTER — Other Ambulatory Visit: Payer: Self-pay | Admitting: Family Medicine

## 2018-02-10 ENCOUNTER — Encounter: Payer: Self-pay | Admitting: Family Medicine

## 2018-02-11 ENCOUNTER — Other Ambulatory Visit: Payer: Self-pay | Admitting: Family Medicine

## 2018-02-12 ENCOUNTER — Encounter: Payer: Self-pay | Admitting: Family Medicine

## 2018-02-14 ENCOUNTER — Other Ambulatory Visit: Payer: Self-pay

## 2018-02-14 MED ORDER — LEVOTHYROXINE SODIUM 75 MCG PO TABS: 75.0000 ug | ORAL_TABLET | Freq: Every day | ORAL | 3 refills | Status: DC

## 2018-02-14 MED ORDER — GLUCOSE BLOOD VI STRP: ORAL_STRIP | 3 refills | Status: DC

## 2018-02-15 ENCOUNTER — Encounter: Payer: Self-pay | Admitting: Family Medicine

## 2018-02-15 MED ORDER — FLUTICASONE PROPIONATE 50 MCG/ACT NA SUSP: 2.0000 | Freq: Every day | NASAL | 1 refills | Status: DC

## 2018-02-16 ENCOUNTER — Encounter: Payer: Self-pay | Admitting: Family Medicine

## 2018-02-19 ENCOUNTER — Encounter: Payer: Self-pay | Admitting: Family Medicine

## 2018-02-19 ENCOUNTER — Telehealth: Payer: Self-pay

## 2018-02-19 MED ORDER — GLUCOSE BLOOD VI STRP: ORAL_STRIP | 2 refills | Status: DC

## 2018-02-19 NOTE — Telephone Encounter (Signed)
Resent test strips one touch ultra  With dx code Brand of test strips Directions Doctor's NPI   As requested by the pharmacy as to what is needed in order for insurance to fill Rx for patient.

## 2018-02-19 NOTE — Telephone Encounter (Signed)
[facility] and she states she is still using the needles. [facility] [address] due to dropping [employer]

## 2018-02-22 ENCOUNTER — Ambulatory Visit: Payer: 59 | Admitting: Family Medicine

## 2018-03-01 ENCOUNTER — Other Ambulatory Visit: Payer: Self-pay | Admitting: Family Medicine

## 2018-03-05 ENCOUNTER — Encounter: Payer: Self-pay | Admitting: Family Medicine

## 2018-03-18 ENCOUNTER — Other Ambulatory Visit: Payer: Self-pay | Admitting: Family Medicine

## 2018-04-05 DIAGNOSIS — M1712 Unilateral primary osteoarthritis, left knee: Secondary | ICD-10-CM | POA: Diagnosis not present

## 2018-04-05 DIAGNOSIS — M1711 Unilateral primary osteoarthritis, right knee: Secondary | ICD-10-CM | POA: Diagnosis not present

## 2018-04-05 DIAGNOSIS — M7051 Other bursitis of knee, right knee: Secondary | ICD-10-CM | POA: Diagnosis not present

## 2018-04-16 ENCOUNTER — Encounter: Payer: Self-pay | Admitting: Family Medicine

## 2018-04-16 MED ORDER — INSULIN PEN NEEDLE 32G X 4 MM MISC: 0 refills | Status: DC

## 2018-05-05 ENCOUNTER — Encounter: Payer: Self-pay | Admitting: Family Medicine

## 2018-05-06 ENCOUNTER — Other Ambulatory Visit: Payer: Self-pay

## 2018-05-06 ENCOUNTER — Encounter: Payer: Self-pay | Admitting: Family Medicine

## 2018-05-06 MED ORDER — LOSARTAN POTASSIUM-HCTZ 100-12.5 MG PO TABS: 1.0000 | ORAL_TABLET | Freq: Every day | ORAL | 0 refills | Status: DC

## 2018-05-06 MED ORDER — FERROUS SULFATE 325 (65 FE) MG PO TABS: ORAL_TABLET | ORAL | 1 refills | Status: DC

## 2018-05-06 NOTE — Telephone Encounter (Signed)
Copied from [place] [id number]. Topic: Quick Communication - Rx Refill/Question >> May 06, 2018  1:34 PM Carolyn Stare wrote: Medication   losartan-hydrochlorothiazide (HYZAAR) 100-12.5 MG tablet       [facility]    [facility]  [facility] [address] [place]    Agent: Please be advised that RX refills may take up to 3 business days. We ask that you follow-up with your pharmacy.

## 2018-05-17 DIAGNOSIS — M26609 Unspecified temporomandibular joint disorder, unspecified side: Secondary | ICD-10-CM | POA: Diagnosis not present

## 2018-05-17 DIAGNOSIS — H903 Sensorineural hearing loss, bilateral: Secondary | ICD-10-CM | POA: Diagnosis not present

## 2018-05-17 DIAGNOSIS — H9209 Otalgia, unspecified ear: Secondary | ICD-10-CM | POA: Diagnosis not present

## 2018-05-17 DIAGNOSIS — G501 Atypical facial pain: Secondary | ICD-10-CM | POA: Diagnosis not present

## 2018-06-26 ENCOUNTER — Ambulatory Visit (INDEPENDENT_AMBULATORY_CARE_PROVIDER_SITE_OTHER): Payer: Medicare Other | Admitting: Family Medicine

## 2018-06-26 ENCOUNTER — Other Ambulatory Visit: Payer: Self-pay

## 2018-06-26 ENCOUNTER — Encounter: Payer: Self-pay | Admitting: Family Medicine

## 2018-06-26 VITALS — BP 110/60 | HR 82 | Temp 98.3°F | Wt 168.8 lb

## 2018-06-26 DIAGNOSIS — D509 Iron deficiency anemia, unspecified: Secondary | ICD-10-CM

## 2018-06-26 DIAGNOSIS — I10 Essential (primary) hypertension: Secondary | ICD-10-CM | POA: Diagnosis not present

## 2018-06-26 DIAGNOSIS — E039 Hypothyroidism, unspecified: Secondary | ICD-10-CM

## 2018-06-26 DIAGNOSIS — E1165 Type 2 diabetes mellitus with hyperglycemia: Secondary | ICD-10-CM | POA: Diagnosis not present

## 2018-06-26 DIAGNOSIS — K219 Gastro-esophageal reflux disease without esophagitis: Secondary | ICD-10-CM

## 2018-06-26 MED ORDER — OMEPRAZOLE 40 MG PO CPDR: 40.0000 mg | DELAYED_RELEASE_CAPSULE | Freq: Every day | ORAL | 3 refills | Status: DC

## 2018-06-26 NOTE — Patient Instructions (Signed)
Nice to see you. We will get lab work today and contact you with the results. I am going to check with our clinical pharmacist regarding your Synthroid in addition to your other medications.

## 2018-06-27 ENCOUNTER — Encounter: Payer: Self-pay | Admitting: Family Medicine

## 2018-06-27 ENCOUNTER — Telehealth: Payer: Self-pay | Admitting: Family Medicine

## 2018-06-27 DIAGNOSIS — K219 Gastro-esophageal reflux disease without esophagitis: Secondary | ICD-10-CM | POA: Insufficient documentation

## 2018-06-27 DIAGNOSIS — D509 Iron deficiency anemia, unspecified: Secondary | ICD-10-CM | POA: Insufficient documentation

## 2018-06-27 NOTE — Assessment & Plan Note (Signed)
Check A1c.  Continue current regimen.  Monitor for hypoglycemia.  If this occurs more frequently she will let us know.

## 2018-06-27 NOTE — Assessment & Plan Note (Signed)
Adequately controlled.  Continue current regimen.  Check labs. 

## 2018-06-27 NOTE — Assessment & Plan Note (Signed)
Check TSH.  Will check with our clinical pharmacist regarding the best time to take the Synthroid and how to separate this from her omeprazole.

## 2018-06-27 NOTE — Assessment & Plan Note (Signed)
Undetermined cause.  She underwent GI evaluation.  Urinalysis negative for blood.  Noted no prior vaginal bleeding.  We will recheck iron studies and CBC.

## 2018-06-27 NOTE — Telephone Encounter (Signed)
Please let the patient know I heard back from our clinical pharmacist.  She noted that given that the patient is currently stable with regards to her Synthroid and TSH levels that the patient could continue to take the Synthroid and omeprazole as she has been.  If at any point the patient stops the omeprazole she will need to let us know as we will need to recheck her TSH a few weeks later.  Thanks.

## 2018-06-27 NOTE — Telephone Encounter (Signed)
-----   Message from De Hollingshead, [place] sent at 06/27/2018 11:58 AM EST ----- Ideally, agreed, I'd want them separate. So probably synthroid first thing, omeprazole before a separate meal? But, since you've already titrated the synthroid appropriately based on how she's taking it, then it's fine to continue taking them together.   If she stops the omeprazole, I'd recommend she let y'all know so that a TSH can be checked a few weeks later to see how much of an impact it might have had.   Catie ----- Message ----- From: Leone Haven, MD Sent: 06/27/2018  11:07 AM EST To: De Hollingshead, [facility]  Hey Catie,   This patient is currently on synthroid and omeprazole. She has been taking them together. I advised her against this today. Is it best to take the synthroid first thing in the morning and then the omeprazole 4+ hours later? Or given her normal TSH would it be ok to continue taking them at the same time in the morning?  Randall Hiss

## 2018-06-27 NOTE — Assessment & Plan Note (Signed)
Well-controlled.  Continue omeprazole.

## 2018-06-27 NOTE — Progress Notes (Signed)
  Tommi Rumps, MD Phone: [phone]  Whitney Knight is a 68 y.o. female who presents today for follow-up.  CC: Diabetes, hypertension, reflux, iron deficiency anemia  Diabetes: Typically fasting 110-135.  Nighttime CBG is 150-200.  Taking Victoza, metformin, and glimepiride.  No polyuria or polydipsia.  She did feel little low last night and her blood sugar was 78.  She had [facility] and felt better.  This typically occurs 1-2 times a month.  Hypertension: Taking losartan HCTZ.  No chest pain, shortness of breath, or edema.  GERD: No reflux, abdominal pain, blood in her stool.  She is taking omeprazole.   Iron deficiency anemia: She continues on iron supplementation for iron deficiency anemia.  She had evaluation with EGD and colonoscopy.  No cause found.  Patient additionally wonders if she can change the time she takes her Synthroid to nighttime.  Social History   Tobacco Use  Smoking Status Never Smoker  Smokeless Tobacco Never Used     ROS see history of present illness  Objective  Physical Exam Vitals:   06/26/18 1449  BP: 110/60  Pulse: 82  Temp: 98.3 F (36.8 C)  SpO2: 99%    BP Readings from Last 3 Encounters:  06/26/18 110/60  01/10/18 (!) 125/59  11/26/17 124/70   Wt Readings from Last 3 Encounters:  06/26/18 168 lb 12.8 oz (76.6 kg)  01/10/18 164 lb (74.4 kg)  11/26/17 164 lb 6 oz (74.6 kg)    Physical Exam Constitutional:      General: She is not in acute distress.    Appearance: She is not diaphoretic.  Cardiovascular:     Rate and Rhythm: Normal rate and regular rhythm.     Heart sounds: Normal heart sounds.  Pulmonary:     Effort: Pulmonary effort is normal.     Breath sounds: Normal breath sounds.  Musculoskeletal:     Right lower leg: No edema.     Left lower leg: No edema.  Skin:    General: Skin is warm and dry.  Neurological:     Mental Status: She is alert.      Assessment/Plan: Please see individual problem  list.  Hypertension Adequately controlled.  Continue current regimen.  Check labs.  Hypothyroidism Check TSH.  Will check with our clinical pharmacist regarding the best time to take the Synthroid and how to separate this from her omeprazole.  Diabetes mellitus type 2, uncontrolled Check A1c.  Continue current regimen.  Monitor for hypoglycemia.  If this occurs more frequently she will let us know.  GERD (gastroesophageal reflux disease) Well-controlled.  Continue omeprazole.  Iron deficiency anemia Undetermined cause.  She underwent GI evaluation.  Urinalysis negative for blood.  Noted no prior vaginal bleeding.  We will recheck iron studies and CBC.   Orders Placed This Encounter  Procedures  . CBC  . Comp Met (CMET)  . Iron, TIBC and Ferritin Panel  . TSH  . HgB A1c    No orders of the defined types were placed in this encounter.    Tommi Rumps, MD [place]

## 2018-06-28 ENCOUNTER — Other Ambulatory Visit: Payer: Self-pay

## 2018-06-28 ENCOUNTER — Other Ambulatory Visit: Payer: Self-pay | Admitting: Family Medicine

## 2018-06-28 LAB — CBC
Hematocrit: 37.9 % (ref 34.0–46.6)
Hemoglobin: 12.8 g/dL (ref 11.1–15.9)
MCH: 29.2 pg (ref 26.6–33.0)
MCHC: 33.8 g/dL (ref 31.5–35.7)
MCV: 86 fL (ref 79–97)
Platelets: 225 10*3/uL (ref 150–450)
RBC: 4.39 x10E6/uL (ref 3.77–5.28)
RDW: 12.6 % (ref 11.7–15.4)
WBC: 8.5 10*3/uL (ref 3.4–10.8)

## 2018-06-28 LAB — COMPREHENSIVE METABOLIC PANEL
ALBUMIN: 4.1 g/dL (ref 3.8–4.8)
ALT: 25 IU/L (ref 0–32)
AST: 24 IU/L (ref 0–40)
Albumin/Globulin Ratio: 1.9 (ref 1.2–2.2)
Alkaline Phosphatase: 92 IU/L (ref 39–117)
BUN / CREAT RATIO: 10 — AB (ref 12–28)
BUN: 10 mg/dL (ref 8–27)
Bilirubin Total: 0.3 mg/dL (ref 0.0–1.2)
CO2: 23 mmol/L (ref 20–29)
Calcium: 9 mg/dL (ref 8.7–10.3)
Chloride: 103 mmol/L (ref 96–106)
Creatinine, Ser: 0.96 mg/dL (ref 0.57–1.00)
GFR calc Af Amer: 72 mL/min/{1.73_m2} (ref >59)
GFR calc non Af Amer: 62 mL/min/{1.73_m2} (ref >59)
Globulin, Total: 2.2 g/dL (ref 1.5–4.5)
Glucose: 130 mg/dL — ABNORMAL HIGH (ref 65–99)
Potassium: 4.2 mmol/L (ref 3.5–5.2)
SODIUM: 143 mmol/L (ref 134–144)
Total Protein: 6.3 g/dL (ref 6.0–8.5)

## 2018-06-28 LAB — IRON,TIBC AND FERRITIN PANEL
Ferritin: 80 ng/mL (ref 15–150)
Iron Saturation: 19 % (ref 15–55)
Iron: 54 ug/dL (ref 27–139)
Total Iron Binding Capacity: 279 ug/dL (ref 250–450)
UIBC: 225 ug/dL (ref 118–369)

## 2018-06-28 LAB — HEMOGLOBIN A1C
Est. average glucose Bld gHb Est-mCnc: 154 mg/dL
HEMOGLOBIN A1C: 7 % — AB (ref 4.8–5.6)

## 2018-06-28 LAB — TSH: TSH: 1.56 u[IU]/mL (ref 0.450–4.500)

## 2018-06-28 MED ORDER — INSULIN PEN NEEDLE 32G X 4 MM MISC: 0 refills | Status: DC

## 2018-06-28 MED ORDER — OXYBUTYNIN CHLORIDE ER 5 MG PO TB24: 5.0000 mg | ORAL_TABLET | Freq: Every day | ORAL | 1 refills | Status: DC

## 2018-06-28 MED ORDER — FERROUS SULFATE 325 (65 FE) MG PO TABS: ORAL_TABLET | ORAL | 1 refills | Status: DC

## 2018-06-28 MED ORDER — GLIMEPIRIDE 2 MG PO TABS: 2.0000 mg | ORAL_TABLET | Freq: Every day | ORAL | 1 refills | Status: DC

## 2018-06-28 MED ORDER — LOSARTAN POTASSIUM-HCTZ 100-12.5 MG PO TABS: 1.0000 | ORAL_TABLET | Freq: Every day | ORAL | 0 refills | Status: DC

## 2018-06-28 MED ORDER — METFORMIN HCL 1000 MG PO TABS: 1000.0000 mg | ORAL_TABLET | Freq: Two times a day (BID) | ORAL | 1 refills | Status: DC

## 2018-06-28 MED ORDER — LEVOTHYROXINE SODIUM 75 MCG PO TABS: 75.0000 ug | ORAL_TABLET | Freq: Every day | ORAL | 3 refills | Status: DC

## 2018-06-28 NOTE — Telephone Encounter (Signed)
Spoke with pt and she understood, she states she was [facility] and she needed some refills. Ill take care of them.  Just a fyi that I had spoke with her.  Gae Bon, CMA

## 2018-07-01 ENCOUNTER — Other Ambulatory Visit: Payer: Self-pay | Admitting: Family Medicine

## 2018-07-01 MED ORDER — FLUTICASONE PROPIONATE 50 MCG/ACT NA SUSP: 2.0000 | Freq: Every day | NASAL | 1 refills | Status: DC

## 2018-07-01 MED ORDER — OXYBUTYNIN CHLORIDE ER 5 MG PO TB24: 5.0000 mg | ORAL_TABLET | Freq: Every day | ORAL | 1 refills | Status: DC

## 2018-07-01 MED ORDER — GLUCOSE BLOOD VI STRP: ORAL_STRIP | 2 refills | Status: DC

## 2018-07-01 MED ORDER — LOSARTAN POTASSIUM-HCTZ 100-12.5 MG PO TABS: 1.0000 | ORAL_TABLET | Freq: Every day | ORAL | 0 refills | Status: DC

## 2018-07-01 MED ORDER — ONETOUCH DELICA LANCETS 33G MISC: 1 refills | Status: DC

## 2018-07-01 MED ORDER — GLIMEPIRIDE 2 MG PO TABS: 2.0000 mg | ORAL_TABLET | Freq: Every day | ORAL | 1 refills | Status: DC

## 2018-07-01 MED ORDER — INSULIN PEN NEEDLE 32G X 4 MM MISC: 0 refills | Status: DC

## 2018-07-01 MED ORDER — LEVOTHYROXINE SODIUM 75 MCG PO TABS: 75.0000 ug | ORAL_TABLET | Freq: Every day | ORAL | 3 refills | Status: DC

## 2018-07-01 MED ORDER — LIRAGLUTIDE 18 MG/3ML ~~LOC~~ SOPN: PEN_INJECTOR | SUBCUTANEOUS | 3 refills | Status: DC

## 2018-07-01 MED ORDER — METFORMIN HCL 1000 MG PO TABS: 1000.0000 mg | ORAL_TABLET | Freq: Two times a day (BID) | ORAL | 1 refills | Status: DC

## 2018-07-01 MED ORDER — FERROUS SULFATE 325 (65 FE) MG PO TABS: ORAL_TABLET | ORAL | 1 refills | Status: DC

## 2018-07-19 ENCOUNTER — Other Ambulatory Visit: Payer: Self-pay | Admitting: Family Medicine

## 2018-07-27 ENCOUNTER — Other Ambulatory Visit: Payer: Self-pay | Admitting: Family Medicine

## 2018-08-03 ENCOUNTER — Other Ambulatory Visit: Payer: Self-pay | Admitting: Family Medicine

## 2018-08-28 DIAGNOSIS — M79644 Pain in right finger(s): Secondary | ICD-10-CM | POA: Diagnosis not present

## 2018-09-20 ENCOUNTER — Other Ambulatory Visit: Payer: Self-pay | Admitting: Family Medicine

## 2018-09-20 ENCOUNTER — Encounter: Payer: Self-pay | Admitting: Family Medicine

## 2018-09-20 MED ORDER — HYDROCHLOROTHIAZIDE 12.5 MG PO CAPS: 12.5000 mg | ORAL_CAPSULE | Freq: Every day | ORAL | 0 refills | Status: DC

## 2018-09-20 MED ORDER — METFORMIN HCL 1000 MG PO TABS: 1000.0000 mg | ORAL_TABLET | Freq: Two times a day (BID) | ORAL | 1 refills | Status: DC

## 2018-09-27 ENCOUNTER — Other Ambulatory Visit: Payer: Self-pay

## 2018-09-27 ENCOUNTER — Encounter: Payer: Self-pay | Admitting: Family Medicine

## 2018-09-27 MED ORDER — OMEPRAZOLE 40 MG PO CPDR: 40.0000 mg | DELAYED_RELEASE_CAPSULE | Freq: Every day | ORAL | 3 refills | Status: DC

## 2018-09-30 MED ORDER — OXYBUTYNIN CHLORIDE ER 5 MG PO TB24: 5.0000 mg | ORAL_TABLET | Freq: Every day | ORAL | 1 refills | Status: DC

## 2018-10-10 ENCOUNTER — Other Ambulatory Visit: Payer: Self-pay | Admitting: Family Medicine

## 2018-10-12 ENCOUNTER — Other Ambulatory Visit: Payer: Self-pay | Admitting: Family Medicine

## 2018-10-14 ENCOUNTER — Other Ambulatory Visit: Payer: Self-pay

## 2018-10-14 MED ORDER — OMEPRAZOLE 40 MG PO CPDR: 40.0000 mg | DELAYED_RELEASE_CAPSULE | Freq: Every day | ORAL | 3 refills | Status: DC

## 2018-10-15 MED ORDER — FAMOTIDINE 20 MG PO TABS: 20.0000 mg | ORAL_TABLET | Freq: Every day | ORAL | 2 refills | Status: DC

## 2018-10-24 LAB — HM DIABETES EYE EXAM

## 2018-10-28 DIAGNOSIS — Z01419 Encounter for gynecological examination (general) (routine) without abnormal findings: Secondary | ICD-10-CM | POA: Diagnosis not present

## 2018-10-28 DIAGNOSIS — Z1231 Encounter for screening mammogram for malignant neoplasm of breast: Secondary | ICD-10-CM | POA: Diagnosis not present

## 2018-10-28 DIAGNOSIS — Z6830 Body mass index (BMI) 30.0-30.9, adult: Secondary | ICD-10-CM | POA: Diagnosis not present

## 2018-11-10 ENCOUNTER — Encounter: Payer: Self-pay | Admitting: Family Medicine

## 2018-11-11 MED ORDER — GLUCOSE BLOOD VI STRP: ORAL_STRIP | 2 refills | Status: DC

## 2018-11-12 NOTE — Progress Notes (Signed)
Abstraction entry for Diabetic retinopathy.  Nina,cma

## 2018-11-20 ENCOUNTER — Telehealth: Payer: Self-pay

## 2018-11-20 DIAGNOSIS — M810 Age-related osteoporosis without current pathological fracture: Secondary | ICD-10-CM | POA: Diagnosis not present

## 2018-11-21 ENCOUNTER — Other Ambulatory Visit: Payer: Self-pay

## 2018-11-21 MED ORDER — VICTOZA 18 MG/3ML ~~LOC~~ SOPN: PEN_INJECTOR | SUBCUTANEOUS | 3 refills | Status: DC

## 2018-11-21 MED ORDER — BD PEN NEEDLE NANO U/F 32G X 4 MM MISC: 0 refills | Status: DC

## 2018-11-21 NOTE — Telephone Encounter (Signed)
Sent to pharmacy 

## 2018-11-21 NOTE — Progress Notes (Signed)
Refill for needled sent to pharmacy per patient request.  Nina,cma

## 2018-11-21 NOTE — Telephone Encounter (Signed)
Called and informed the patient her medication was sent to pharmacy.  Jaxtyn [place],cma

## 2018-11-25 ENCOUNTER — Encounter: Payer: Self-pay | Admitting: *Deleted

## 2018-11-26 ENCOUNTER — Ambulatory Visit (INDEPENDENT_AMBULATORY_CARE_PROVIDER_SITE_OTHER): Payer: Medicare Other | Admitting: Internal Medicine

## 2018-11-26 ENCOUNTER — Encounter: Payer: Self-pay | Admitting: Internal Medicine

## 2018-11-26 VITALS — Ht 63.5 in | Wt 171.0 lb

## 2018-11-26 DIAGNOSIS — D509 Iron deficiency anemia, unspecified: Secondary | ICD-10-CM | POA: Diagnosis not present

## 2018-11-26 DIAGNOSIS — Z86010 Personal history of colonic polyps: Secondary | ICD-10-CM

## 2018-11-26 DIAGNOSIS — K219 Gastro-esophageal reflux disease without esophagitis: Secondary | ICD-10-CM

## 2018-11-26 NOTE — Progress Notes (Signed)
Subjective:    Patient ID: Whitney Knight, female    DOB: 05/10/1950, 68 y.o.   MRN: [id number]  This service was provided via telemedicine.  Telephone encounter The patient was located at home The provider was located in provider's GI office. The patient did consent to this telephone visit and is aware of possible charges through their insurance for this visit.   The persons participating in this telemedicine service were the patient and I. Time spent on call: 12 minutes   HPI Whitney Knight is a 68 year old female with a history of GERD, esophageal dysphagia responsive to dilation, history of adenoma of the colon, colonic diverticulosis, history of iron deficiency anemia, diabetes, hypertension, hypothyroidism and osteoporosis who is seen for follow-up.  She was last seen at the time of upper endoscopy and colonoscopy in September 2019 performed for IDA.  She is seen by telephone encounter today in the setting of COVID-19 pandemic.  Upper endoscopy and colonoscopy performed in September 2019. EGD with empiric Savary dilation to 17 mm.  Mild antral erythema biopsied and showed mild chronic gastritis without H. pylori.  Duodenal biopsies were normal without evidence for celiac disease Colonoscopy 2 polyps removed from the left colon both less than a centimeter.  One tubular adenoma, one hyperplastic.  Left-sided diverticulosis.  Small internal hemorrhoids.  She reports she has been doing well.  Her reflux is well controlled.  She has noticed some mild issues with swallowing just in the last few weeks which she attributes to allergies and also the heat and humidity.  Had not been an issue before and definitely got better after dilation in September.  She is using omeprazole in the morning and famotidine 10 mg in the evening.  She will occasionally use a chewing Tums for breakthrough symptoms.  She is remained on oral iron.  Occasionally will have IBS symptoms of loose stools.  No blood in her  stool or melena.  She will be seeing Dr. Pasty Arch with endocrinology soon to discuss her osteoporosis and consider repeat Reclast.  She had a Reclast infusion in August 2019.  She had blood counts and iron studies in February with primary care.  Her anemia has resolved and her iron studies normalized.  She continues daily oral iron.   Review of Systems As per HPI, otherwise negative     Objective:   Physical Exam No PE, virtual visit  CBC    Component Value Date/Time   WBC 8.5 06/26/2018 1521   RBC 4.39 06/26/2018 1521   HGB 12.8 06/26/2018 1521   HCT 37.9 06/26/2018 1521   PLT 225 06/26/2018 1521   MCV 86 06/26/2018 1521   MCH 29.2 06/26/2018 1521   MCHC 33.8 06/26/2018 1521   RDW 12.6 06/26/2018 1521   LYMPHSABS 1.5 05/13/2012 0927   EOSABS 0.3 05/13/2012 0927   BASOSABS 0.0 05/13/2012 0927   Iron/TIBC/Ferritin/ %Sat    Component Value Date/Time   IRON 54 06/26/2018 1521   TIBC 279 06/26/2018 1521   FERRITIN 80 06/26/2018 1521   IRONPCTSAT 19 06/26/2018 1521   CMP     Component Value Date/Time   NA 143 06/26/2018 1521   K 4.2 06/26/2018 1521   CL 103 06/26/2018 1521   CO2 23 06/26/2018 1521   GLUCOSE 130 (H) 06/26/2018 1521   BUN 10 06/26/2018 1521   CREATININE 0.96 06/26/2018 1521   CALCIUM 9.0 06/26/2018 1521   PROT 6.3 06/26/2018 1521   ALBUMIN 4.1 06/26/2018 1521   AST  24 06/26/2018 1521   ALT 25 06/26/2018 1521   ALKPHOS 92 06/26/2018 1521   BILITOT 0.3 06/26/2018 1521   GFRNONAA 62 06/26/2018 1521   GFRAA 72 06/26/2018 1521       Assessment & Plan:  68 year old female with a history of GERD, esophageal dysphagia responsive to dilation, history of adenoma of the colon, colonic diverticulosis, history of iron deficiency anemia, diabetes, hypertension, hypothyroidism and osteoporosis who is seen for follow-up.    1.  GERD with history of dysphagia responsive to dilation --symptoms well controlled at present.  She will be continue omeprazole 40 mg  in the morning and famotidine 10 mg in the evening.  Repeat EGD for dilation can be performed as symptoms warrant  2.  IDA --resolved.  Unremarkable upper and lower endoscopy.  We will continue oral iron daily and primary care can monitor blood counts and iron studies.  If she becomes anemic again despite oral iron then I would recommend video capsule endoscopy.  3.  History of tubular adenoma --subcentimeter adenoma in 2019, repeat in 5 to 7 years.  Annual follow-up, sooner if needed

## 2018-11-26 NOTE — Patient Instructions (Addendum)
Continue omeprazole 40 mg in the morning and famotidine 10 mg in the evening.    Please follow-up with Dr Hilarie Fredrickson in 1 year; sooner if needed.  If you are age 66 or older, your body mass index should be between 23-30. Your Body mass index is 29.82 kg/m. If this is out of the aforementioned range listed, please consider follow up with your Primary Care Provider.  If you are age 72 or younger, your body mass index should be between 19-25. Your Body mass index is 29.82 kg/m. If this is out of the aformentioned range listed, please consider follow up with your Primary Care Provider.

## 2018-11-27 DIAGNOSIS — M810 Age-related osteoporosis without current pathological fracture: Secondary | ICD-10-CM | POA: Diagnosis not present

## 2018-12-03 ENCOUNTER — Other Ambulatory Visit: Payer: Self-pay | Admitting: Internal Medicine

## 2018-12-04 ENCOUNTER — Encounter: Payer: Self-pay | Admitting: Family Medicine

## 2018-12-04 DIAGNOSIS — E119 Type 2 diabetes mellitus without complications: Secondary | ICD-10-CM

## 2018-12-05 NOTE — Telephone Encounter (Signed)
I did a prior authorization on the one touch test strips and it was denied and there was no option to write for anything else, I called the pharmacy after the patient sent a message asking about the denial and the pharmacist states she did not know why it was denied but she could get her a new monitor by [employer] that is cheaper that her insurance would cover.  I called and explained this to the patient and informed her to call the pharmacy and they will setup everything up with a new monitor patient understood.  Candler Ginsberg,cma

## 2018-12-06 NOTE — Addendum Note (Signed)
Addended by: Leone Haven on: 12/06/2018 05:31 PM   Modules accepted: Orders

## 2018-12-09 ENCOUNTER — Ambulatory Visit: Payer: Self-pay | Admitting: Pharmacist

## 2018-12-09 DIAGNOSIS — E119 Type 2 diabetes mellitus without complications: Secondary | ICD-10-CM

## 2018-12-09 NOTE — Chronic Care Management (AMB) (Signed)
  Chronic Care Management   Note  12/09/2018 Name: Whitney Knight MRN: [id number] DOB: Oct 03, 1950  Whitney Knight is a 68 y.o. year old female who is a primary care patient of [name], [name], MD. The CCM team was consulted for assistance with chronic disease management and care coordination needs.    Contacted patient, reviewed that she has hit the Medicare Coverage Gap and Victoza copay was >$200. Reviewed income criteria for [employer] and/or [employer]; patient's total household income is over this limit.   Follow up plan: - Scheduled phone call visit next week for comprehensive medication review.   Catie Darnelle Maffucci, PharmD, [place] Pharmacist [facility] [place] [phone]

## 2018-12-09 NOTE — Patient Instructions (Signed)
Visit Information  The patient verbalized understanding of instructions provided today and declined a print copy of patient instruction materials.   Follow up plan: - Scheduled phone call visit next week for comprehensive medication review.   Catie Darnelle Maffucci, PharmD, [place] Pharmacist [facility] [place] [phone]

## 2018-12-16 ENCOUNTER — Telehealth: Payer: Medicare Other

## 2018-12-19 ENCOUNTER — Ambulatory Visit: Payer: Medicare Other | Admitting: Pharmacist

## 2018-12-19 ENCOUNTER — Telehealth: Payer: Self-pay | Admitting: Pharmacist

## 2018-12-19 DIAGNOSIS — E119 Type 2 diabetes mellitus without complications: Secondary | ICD-10-CM

## 2018-12-19 MED ORDER — ONETOUCH DELICA LANCETS 33G MISC: 1 refills | Status: DC

## 2018-12-19 MED ORDER — OZEMPIC (0.25 OR 0.5 MG/DOSE) 2 MG/1.5ML ~~LOC~~ SOPN: 0.5000 mg | PEN_INJECTOR | SUBCUTANEOUS | 2 refills | Status: DC

## 2018-12-19 MED ORDER — GLUCOSE BLOOD VI STRP: ORAL_STRIP | 2 refills | Status: DC

## 2018-12-19 NOTE — Chronic Care Management (AMB) (Signed)
Chronic Care Management   Note  12/19/2018 Name: CHARISMA CHARLOT MRN: [id number] DOB: 10-01-1950   Subjective:  STARNISHA BATREZ is a 68 y.o. year old female who is a primary care patient of [name], [name], MD. The CCM team was consulted for assistance with chronic disease management and care coordination needs.     Ms. Kobler was given information about Chronic Care Management services today including:  1. CCM service includes personalized support from designated clinical staff supervised by her physician, including individualized plan of care and coordination with other care providers 2. 24/7 contact phone numbers for assistance for urgent and routine care needs. 3. Service will only be billed when office clinical staff spend 20 minutes or more in a month to coordinate care. 4. Only one practitioner may furnish and bill the service in a calendar month. 5. The patient may stop CCM services at any time (effective at the end of the month) by phone call to the office staff. 6. The patient will be responsible for cost sharing (co-pay) of up to 20% of the service fee (after annual deductible is met).  Patient agreed to services and verbal consent obtained.   Review of patient status, including review of consultants reports, laboratory and other test data, was performed as part of comprehensive evaluation and provision of chronic care management services.   Objective:  Lab Results  Component Value Date   CREATININE 0.96 06/26/2018   CREATININE 0.95 08/23/2017   CREATININE 1.12 (H) 05/31/2017    Lab Results  Component Value Date   HGBA1C 7.0 (H) 06/26/2018       Component Value Date/Time   CHOL 169 08/23/2017 0826   TRIG 90 08/23/2017 0826   HDL 62 08/23/2017 0826   CHOLHDL 2.7 08/23/2017 0826   LDLCALC 89 08/23/2017 0826    Clinical ASCVD: No  The 10-year ASCVD risk score Mikey Bussing DC Jr., et al., 2013) is: 10.3%   Values used to calculate the score:     Age: 82 years    Sex: Female     Is Non-Hispanic African American: No     Diabetic: Yes     Tobacco smoker: No     Systolic Blood Pressure: 892 mmHg     Is BP treated: Yes     HDL Cholesterol: 62 mg/dL     Total Cholesterol: 169 mg/dL    BP Readings from Last 3 Encounters:  06/26/18 110/60  01/10/18 (!) 125/59  11/26/17 124/70    Allergies  Allergen Reactions  . Clarithromycin     REACTION: Nausea  . Jardiance [Empagliflozin] Nausea And Vomiting  . Penicillins     REACTION: Throat swelling    Medications Reviewed Today    Reviewed by De Hollingshead, [facility] (Pharmacist) on 12/19/18 at [phone]  Med List Status: <None>  Medication Order Taking? Sig Documenting Provider Last Dose Status Informant  Betamethasone Valerate 0.12 % foam [id number] Yes Apply to affected area once daily for up to 7 days  Patient taking differently: Apply to affected area once daily for up to 7 days-- as needed   Leone Haven, MD Taking Active   calcium carbonate (TUMS EX) 750 MG chewable tablet [id number] Yes Chew by mouth as needed.  [provider] Taking Active   Casanthranol-Docusate Sodium 30-100 MG CAPS [id number] Yes Take 1 tablet by mouth daily. Lucille Passy, MD Taking Active            Med Note (TRAVIS,  E  Thu Dec 19, 2018  8:35 AM) Occasionally every night  cholecalciferol (VITAMIN D) 1000 UNITS tablet [id number] Yes Take 1,000 Units by mouth daily.  [provider] Taking Active   Emollient (COLLAGEN EX) [id number] Yes liquid [provider] Taking Active   famotidine (PEPCID) 20 MG tablet [id number] Yes Take 1/2 tablet by mouth daily at bedtime Jerene Bears, MD Taking Active   FEROSUL 325 (65 Fe) MG tablet [id number] Yes TAKE ONE TABLET EVERY DAY WITH BREAKFAST Leone Haven, MD Taking Active   fluticasone [facility]) 50 MCG/ACT nasal spray [id number] Yes Place 2 sprays into both nostrils daily. Leone Haven, MD Taking Active   glimepiride (AMARYL) 2 MG tablet  [id number] Yes Take 1 tablet (2 mg total) by mouth daily with breakfast.  Patient taking differently: Take 2 mg by mouth at bedtime as needed.    Leone Haven, MD Taking Active   glucose blood (ONE TOUCH ULTRA TEST) test strip [id number] Yes CHECK BLOOD SUGAR TWICE DAILY. Dx code E11.9, Brand: One touch Ultra, NPI: [id number] Leone Haven, MD Taking Active   hydrochlorothiazide (MICROZIDE) 12.5 MG capsule [id number] Yes Take 1 capsule (12.5 mg total) by mouth daily. Leone Haven, MD Taking Active   Insulin Pen Needle (BD PEN NEEDLE NANO U/F) 32G X 4 MM MISC [id number] Yes Inject daily as directed Leone Haven, MD Taking Active   levothyroxine (SYNTHROID, LEVOTHROID) 75 MCG tablet [id number] Yes Take 1 tablet (75 mcg total) by mouth daily. Leone Haven, MD Taking Active   liraglutide (VICTOZA) 18 MG/3ML SOPN [id number] Yes INJECT SUBCUTANEOUSLY 1.8MG DAILY Leone Haven, MD Taking Active   losartan (COZAAR) 100 MG tablet [id number] Yes TAKE ONE TABLET EVERY DAY Leone Haven, MD Taking Active   metFORMIN (GLUCOPHAGE) 1000 MG tablet [id number] Yes Take 1 tablet (1,000 mg total) by mouth 2 (two) times daily with a meal. Leone Haven, MD Taking Active   Multiple Vitamins-Minerals (WOMENS MULTIVITAMIN PO) [id number] Yes Take by mouth. [provider] Taking Active   omeprazole (PRILOSEC) 40 MG capsule [id number] Yes Take 1 capsule (40 mg total) by mouth daily for 30 days. Jerene Bears, MD Taking Active   [facility] LANCETS [address] [place] [id number] Yes USE 2-3 TIMES DAILY AS  DIRECTED Leone Haven, MD Taking Active   oxybutynin (DITROPAN-XL) 5 MG 24 hr tablet [id number] Yes Take 1 tablet (5 mg total) by mouth daily. Leone Haven, MD Taking Active   POTASSIUM CHLORIDE PO [id number] Yes Take 99 mg by mouth. Taking OTC, unsure mg  [provider] Taking Active   Probiotic Product (PROBIOTIC DAILY PO) [id number] Yes Take by mouth. [provider] Taking Active   Zoledronic Acid (RECLAST IV) [id number]  Inject into the vein. Once a year [provider]  Active   Med List Note Arby Barrette, [place] 09/12/17 0813): SEND LABS TO [facility]           Assessment:   Goals Addressed            This Visit's Progress     Patient Stated   . "I want to work on my blood sugars" (pt-stated)       Current Barriers:  . Diabetes: uncontrolled; most recent A1c 7.0%  o Reports that she just started on Medicare insurance in July, and is confused about her benefits, deductible, and copayments o Previously spoke about financial requirements, she is over income for [employer] patient assistance  .  Current antihyperglycemic regimen: metformin 1000 mg BID, Victoza 1.8 mg once daily; glimepiride ~1 mg PRN higher sugars at bedtime  . Denies hypoglycemic episodes, even when taking . Current meal patterns: o Breakfast: Little bites blueberry muffins (15 g CHO); sometimes yogurt (Dannactive probiotic daily); or light and fit greek yogurt (9 g CHO) o Midmorning snack: GC Control protein shake (1 g CHO, 11 g protein) (though hasn't been doing this as much since being at home) o Lunch: 1/2 peanut butter sandwich + low carb chips or cheese straws; sometimes 1/2 pimiento cheese sandwich; occasionally vegetable + meat plate o Supper: Depends; generally eats out o Snacks: Occasionally pretzel crisps;  o Drinks: Diet pepsi; water; sugar free koolaid;  . Current exercise: Has a treadmill, but hasn't been using because she tore meniscus in knee last year; ortho recommends getting a bike; tracks steps with apple watch; allergies are too bad to exercise outside . Current blood glucose readings: Fasting: 97; 123, HS: 140-170 . Cardiovascular risk reduction: o Current hypertensive regimen: losartan 100 mg daily, HCTZ 12.5 mg daily  o Current hyperlipidemia regimen: none; 10 year ASCVD risk 10.3%  Pharmacist Clinical Goal(s):  [name] Over the  next 90 days, patient with work with PharmD and primary care provider to address optimized diabetes management  Interventions: . Comprehensive medication review performed, medication list updated in electronic medical record . DUE for follow up with Dr. [name], as well as labwork. Will alert Dr. [name] to this to determine when he would like these scheduled . Reviewed Medicare Coverage Gap principals and drug spend totals for 2020.  [name] Extensive review of dietary/lifestyle choices . Discussed that patient may have a deductible to meet if her coverage just started in July. She did research on Trulicity/Ozempic, and is interested if either of these would be cheaper. Discussed that there is no efficacy comparison between Victoza and weekly options, but that Ozempic is more effective than Trulicity, so would recommend Ozempic. Sent Rx to [facility], however, copay is just as expensive as Victoza. She has elected to continue Victoza and focus on lifestyle modifications.  . Notes that she is having concerns getting a glucometer covered on her new plan. Contacted [facility]; they cannot bill under Part B. She would like to continue to use One Touch meter, so sent prescriptions to [facility] per her request.  . Moving forward, will discuss statin therapy, given hx DM and ASCVD risk  Patient Self Care Activities:  . Patient will check blood glucose BID , document, and provide at future appointments . Patient will take medications as prescribed . Patient will report any questions or concerns to provider   Initial goal documentation        Plan: - Will outreach patient in 4 weeks for continued medication management support   Catie Darnelle Maffucci, PharmD, [place] Pharmacist [place] [facility] [phone]

## 2018-12-19 NOTE — Progress Notes (Signed)
I have reviewed the above note and agree. I was available to the pharmacist for consultation.  Eric Sonnenberg, MD  

## 2018-12-19 NOTE — Patient Instructions (Signed)
Visit Information  Goals Addressed            This Visit's Progress     Patient Stated   . "I want to work on my blood sugars" (pt-stated)       Current Barriers:  . Diabetes: uncontrolled; most recent A1c 7.0%  o Reports that she just started on Medicare insurance in July, and is confused about her benefits, deductible, and copayments o Previously spoke about financial requirements, she is over income for [employer] patient assistance  . Current antihyperglycemic regimen: metformin 1000 mg BID, Victoza 1.8 mg once daily; glimepiride ~1 mg PRN higher sugars at bedtime  . Denies hypoglycemic episodes, even when taking . Current meal patterns: o Breakfast: Little bites blueberry muffins (15 g CHO); sometimes yogurt (Dannactive probiotic daily); or light and fit greek yogurt (9 g CHO) o Midmorning snack: GC Control protein shake (1 g CHO, 11 g protein) (though hasn't been doing this as much since being at home) o Lunch: 1/2 peanut butter sandwich + low carb chips or cheese straws; sometimes 1/2 pimiento cheese sandwich; occasionally vegetable + meat plate o Supper: Depends; generally eats out o Snacks: Occasionally pretzel crisps;  o Drinks: Diet pepsi; water; sugar free koolaid;  . Current exercise: Has a treadmill, but hasn't been using because she tore meniscus in knee last year; ortho recommends getting a bike; tracks steps with apple watch; allergies are too bad to exercise outside . Current blood glucose readings: Fasting: 97; 123, HS: 140-170 . Cardiovascular risk reduction: o Current hypertensive regimen: losartan 100 mg daily, HCTZ 12.5 mg daily  o Current hyperlipidemia regimen: none; 10 year ASCVD risk 10.3%  Pharmacist Clinical Goal(s):  [name] Over the next 90 days, patient with work with PharmD and primary care provider to address optimized diabetes management  Interventions: . Comprehensive medication review performed, medication list updated in electronic medical  record . DUE for follow up with Dr. [name], as well as labwork. Will alert Dr. [name] to this to determine when he would like these scheduled . Reviewed Medicare Coverage Gap principals and drug spend totals for 2020.  [name] Extensive review of dietary/lifestyle choices . Discussed that patient may have a deductible to meet if her coverage just started in July. She did research on Trulicity/Ozempic, and is interested if either of these would be cheaper. Discussed that there is no efficacy comparison between Victoza and weekly options, but that Ozempic is more effective than Trulicity, so would recommend Ozempic. Sent Rx to [facility], however, copay is just as expensive as Victoza. She has elected to continue Victoza and focus on lifestyle modifications.  . Notes that she is having concerns getting a glucometer covered on her new plan. Contacted [facility]; they cannot bill under Part B. She would like to continue to use One Touch meter, so sent prescriptions to [facility] per her request.  . Moving forward, will discuss statin therapy, given hx DM and ASCVD risk  Patient Self Care Activities:  . Patient will check blood glucose BID , document, and provide at future appointments . Patient will take medications as prescribed . Patient will report any questions or concerns to provider   Initial goal documentation        Ms. Stankiewicz was given information about Chronic Care Management services today including:  1. CCM service includes personalized support from designated clinical staff supervised by her physician, including individualized plan of care and coordination with other care providers 2. 24/7 contact  phone numbers for assistance for urgent and routine care needs. 3. Service will only be billed when office clinical staff spend 20 minutes or more in a month to coordinate care. 4. Only one practitioner may furnish and bill the service in a calendar month. 5. The patient  may stop CCM services at any time (effective at the end of the month) by phone call to the office staff. 6. The patient will be responsible for cost sharing (co-pay) of up to 20% of the service fee (after annual deductible is met).  Patient agreed to services and verbal consent obtained.   The patient verbalized understanding of instructions provided today and declined a print copy of patient instruction materials.   Plan: - Will outreach patient in 4 weeks for continued medication management support   Catie Darnelle Maffucci, PharmD, [place] Pharmacist [facility] [phone]

## 2018-12-19 NOTE — Telephone Encounter (Signed)
Patient due for follow up with Dr. Biagio Quint. Can you outreach her and help schedule this? Thanks!

## 2018-12-19 NOTE — Progress Notes (Deleted)
Clinical ASCVD: {YES/NO:21197} The 10-year ASCVD risk score Mikey Bussing DC Jr., et al., 2013) is: 10.3%   Values used to calculate the score:     Age: 68 years     Sex: Female     Is Non-Hispanic African American: No     Diabetic: Yes     Tobacco smoker: No     Systolic Blood Pressure: A999333 mmHg     Is BP treated: Yes     HDL Cholesterol: 62 mg/dL     Total Cholesterol: 169 mg/dL

## 2018-12-20 NOTE — Telephone Encounter (Signed)
Pt scheduled  

## 2018-12-26 ENCOUNTER — Other Ambulatory Visit: Payer: Self-pay

## 2018-12-26 DIAGNOSIS — E119 Type 2 diabetes mellitus without complications: Secondary | ICD-10-CM

## 2018-12-26 MED ORDER — BD PEN NEEDLE NANO U/F 32G X 4 MM MISC: 0 refills | Status: DC

## 2018-12-27 ENCOUNTER — Other Ambulatory Visit: Payer: Self-pay | Admitting: Family Medicine

## 2018-12-27 DIAGNOSIS — M810 Age-related osteoporosis without current pathological fracture: Secondary | ICD-10-CM | POA: Diagnosis not present

## 2018-12-30 ENCOUNTER — Other Ambulatory Visit: Payer: Self-pay | Admitting: Internal Medicine

## 2019-01-08 ENCOUNTER — Other Ambulatory Visit: Payer: Self-pay | Admitting: Family Medicine

## 2019-01-08 ENCOUNTER — Other Ambulatory Visit: Payer: Self-pay | Admitting: Internal Medicine

## 2019-01-10 ENCOUNTER — Other Ambulatory Visit: Payer: Self-pay | Admitting: Family Medicine

## 2019-01-23 ENCOUNTER — Telehealth: Payer: Medicare Other

## 2019-01-23 ENCOUNTER — Ambulatory Visit: Payer: Self-pay | Admitting: Pharmacist

## 2019-01-23 NOTE — Chronic Care Management (AMB) (Signed)
  Chronic Care Management   Note  01/23/2019 Name: ROBERT DESIRE MRN: [id number] DOB: [birth year]  Whitney Knight is a 68 y.o. year old female who is a primary care patient of [name], [name], MD. The CCM team was consulted for assistance with chronic disease management and care coordination needs.   Attempted to contact patient for medication management review. Left HIPAA compliant message for patient to return my call at her convenience.     Follow up plan: - If I do not hear back, will outreach patient in the next 3-5 weeks for continued medication management support  Catie Darnelle Maffucci, PharmD, [place] Pharmacist [place] [phone]

## 2019-02-03 ENCOUNTER — Ambulatory Visit: Payer: Medicare Other | Admitting: Family Medicine

## 2019-02-12 ENCOUNTER — Encounter: Payer: Self-pay | Admitting: Family Medicine

## 2019-02-14 ENCOUNTER — Ambulatory Visit (INDEPENDENT_AMBULATORY_CARE_PROVIDER_SITE_OTHER): Payer: Medicare Other | Admitting: Family Medicine

## 2019-02-14 ENCOUNTER — Other Ambulatory Visit: Payer: Self-pay

## 2019-02-14 ENCOUNTER — Encounter: Payer: Self-pay | Admitting: Family Medicine

## 2019-02-14 VITALS — Ht 63.5 in | Wt 167.0 lb

## 2019-02-14 DIAGNOSIS — E663 Overweight: Secondary | ICD-10-CM

## 2019-02-14 DIAGNOSIS — R252 Cramp and spasm: Secondary | ICD-10-CM | POA: Diagnosis not present

## 2019-02-14 DIAGNOSIS — I10 Essential (primary) hypertension: Secondary | ICD-10-CM | POA: Diagnosis not present

## 2019-02-14 DIAGNOSIS — J309 Allergic rhinitis, unspecified: Secondary | ICD-10-CM | POA: Diagnosis not present

## 2019-02-14 DIAGNOSIS — E039 Hypothyroidism, unspecified: Secondary | ICD-10-CM

## 2019-02-14 DIAGNOSIS — E1165 Type 2 diabetes mellitus with hyperglycemia: Secondary | ICD-10-CM

## 2019-02-14 MED ORDER — TETANUS-DIPHTHERIA TOXOIDS TD 5-2 LFU IM INJ: 0.5000 mL | INJECTION | Freq: Once | INTRAMUSCULAR | 0 refills | Status: AC

## 2019-02-14 NOTE — Progress Notes (Signed)
Virtual Visit via telephone Note  This visit type was conducted due to national recommendations for restrictions regarding the COVID-19 pandemic (e.g. social distancing).  This format is felt to be most appropriate for this patient at this time.  All issues noted in this document were discussed and addressed.  No physical exam was performed (except for noted visual exam findings with Video Visits).   I connected with Whitney Knight today at  4:00 PM EDT by telephone and verified that I am speaking with the correct person using two identifiers. Location patient: home Location provider: work  Persons participating in the virtual visit: patient, provider  I discussed the limitations, risks, security and privacy concerns of performing an evaluation and management service by telephone and the availability of in person appointments. I also discussed with the patient that there may be a patient responsible charge related to this service. The patient expressed understanding and agreed to proceed.  Interactive audio and video telecommunications were attempted between this provider and patient, however failed, due to patient having technical difficulties OR patient did not have access to video capability.  We continued and completed visit with audio only.  Reason for visit: follow-up  HPI: DIABETES Disease Monitoring: Blood Sugar ranges-101-140 Polyuria/phagia/dipsia- no      Optho- utd Medications: Compliance- taking metformin, victoza, glimeperide prn at night for elevated CBGs hypoglycemic symptoms- occasional if does not eat  HYPERTENSION  Disease Monitoring  Home BP Monitoring 128/71 Chest pain- no    Dyspnea- no Medications  Compliance-  Taking HCTZ/lisinopril.   Edema- no  Allergic rhinitis: Patient notes chronic issues with rhinorrhea in the morning and occasional sneezing.  She is on Flonase and Zyrtec.  She started nasal saline rinse that is been quite beneficial.  HYPOTHYROIDISM  Disease Monitoring Weight changes: no  Skin Changes: no Heat/Cold intolerance: no  Medication Monitoring Compliance:  Taking synthroid   Last TSH:   Lab Results  Component Value Date   TSH 1.560 06/26/2018   Leg cramps: These have resolved with starting on an over-the-counter potassium supplement.  Discussion had regarding Shingrix vaccine, tetanus vaccine, and flu vaccine.  She will have the flu vaccine in our office when she comes for labs.  She will get the Shingrix vaccine and tetanus vaccine at the pharmacy.  ROS: See pertinent positives and negatives per HPI.  Past Medical History:  Diagnosis Date  . Allergy   . Anemia   . Arthritis    KNEES  . Cataract   . Chronic sinusitis   . Cough   . Diabetes mellitus   . Diverticulosis   . GERD (gastroesophageal reflux disease)   . Headache(784.0)    [facility] in past  . Hypertension   . Osteoporosis   . Thyroid disease     Past Surgical History:  Procedure Laterality Date  . bone graft     for dental surgery  . CARPAL TUNNEL RELEASE Right 02/27/2013   [facility]  . COLONOSCOPY  2014  . dental implants    . DENTAL SURGERY    . NASAL SINUS SURGERY  06/2008  . TRIGGER FINGER RELEASE Right 02/27/2013   [facility]  . UPPER GASTROINTESTINAL ENDOSCOPY    . VAGINAL DELIVERY     2    Family History  Problem Relation Age of Onset  . COPD Father   . Hypertension Father   . Heart disease Father   . Heart disease Mother   . Diabetes Maternal Grandmother   . Colon  cancer Neg Hx   . Esophageal cancer Neg Hx   . Pancreatic cancer Neg Hx   . Stomach cancer Neg Hx   . Liver disease Neg Hx     SOCIAL HX: Non-smoker.   Current Outpatient Medications:  .  Betamethasone Valerate 0.12 % foam, Apply to affected area once daily for up to 7 days (Patient taking differently: Apply to affected area once daily for up to 7 days-- as needed), Disp: 100 g, Rfl: 0 .  calcium carbonate (TUMS EX) 750 MG chewable tablet, Chew by mouth  as needed. , Disp: , Rfl:  .  Casanthranol-Docusate Sodium 30-100 MG CAPS, Take 1 tablet by mouth daily., Disp: 90 each, Rfl: 3 .  cetirizine (ZYRTEC) 10 MG tablet, Take 10 mg by mouth daily., Disp: , Rfl:  .  cholecalciferol (VITAMIN D) 1000 UNITS tablet, Take 1,000 Units by mouth daily. , Disp: , Rfl:  .  Emollient (COLLAGEN EX), liquid, Disp: , Rfl:  .  famotidine (PEPCID) 20 MG tablet, Take 1/2 tablet by mouth daily at bedtime, Disp: 30 tablet, Rfl: 1 .  FEROSUL 325 (65 Fe) MG tablet, TAKE ONE TABLET BY MOUTH EVERY DAY WITH BREAKFAST, Disp: 30 tablet, Rfl: 1 .  fluticasone (FLONASE) 50 MCG/ACT nasal spray, Place 2 sprays into both nostrils daily., Disp: 48 g, Rfl: 1 .  glimepiride (AMARYL) 2 MG tablet, Take 1 tablet (2 mg total) by mouth daily with breakfast. (Patient taking differently: Take 2 mg by mouth at bedtime as needed. ), Disp: 90 tablet, Rfl: 1 .  glucose blood (ONE TOUCH ULTRA TEST) test strip, CHECK BLOOD SUGAR TWICE DAILY. Dx code E11.9, Brand: One touch Ultra, NPI: [id number], Disp: 200 each, Rfl: 2 .  hydrochlorothiazide (MICROZIDE) 12.5 MG capsule, TAKE 1 CAPSULE EVERY DAY, Disp: 90 capsule, Rfl: 0 .  Insulin Pen Needle (BD PEN NEEDLE NANO U/F) 32G X 4 MM MISC, Inject daily as directed, Disp: 90 each, Rfl: 0 .  levothyroxine (SYNTHROID) 75 MCG tablet, TAKE ONE TABLET EVERY DAY, Disp: 90 tablet, Rfl: 1 .  liraglutide (VICTOZA) 18 MG/3ML SOPN, INJECT SUBCUTANEOUSLY 1.8MG  DAILY, Disp: 81 mL, Rfl: 3 .  losartan (COZAAR) 100 MG tablet, TAKE ONE TABLET EVERY DAY, Disp: 90 tablet, Rfl: 1 .  metFORMIN (GLUCOPHAGE) 1000 MG tablet, Take 1 tablet (1,000 mg total) by mouth 2 (two) times daily with a meal., Disp: 90 tablet, Rfl: 1 .  Multiple Vitamins-Minerals (WOMENS MULTIVITAMIN PO), Take by mouth., Disp: , Rfl:  .  omeprazole (PRILOSEC) 40 MG capsule, Take 1 capsule (40 mg total) by mouth daily for 30 days., Disp: 90 capsule, Rfl: 3 .  OneTouch [employer] Lancets [id number] MISC, USE 2 TIMES DAILY  AS  DIRECTED, Disp: 300 each, Rfl: 1 .  oxybutynin (DITROPAN-XL) 5 MG 24 hr tablet, Take 1 tablet (5 mg total) by mouth daily., Disp: 90 tablet, Rfl: 1 .  POTASSIUM CHLORIDE PO, Take 99 mg by mouth. Taking OTC, unsure mg , Disp: , Rfl:  .  Probiotic Product (PROBIOTIC DAILY PO), Take by mouth., Disp: , Rfl:  .  Zoledronic Acid (RECLAST IV), Inject into the vein. Once a year, Disp: , Rfl:  .  tetanus & diphtheria toxoids, adult, (TENIVAC) 5-2 LFU injection, Inject 0.5 mLs into the muscle once for 1 dose., Disp: 0.5 mL, Rfl: 0  EXAM: THIS WAS A TELEHEALTH TELEPHONE VISIT AND THUS NO PHYSICAL EXAM WAS COMPLETED.    ASSESSMENT AND PLAN:  Discussed the following assessment and plan:  Hypertension  Adequately controlled.  Continue current regimen.  She will come in for lab work.  Diabetes mellitus type 2, uncontrolled Seems to be well controlled.  We will have her come in for lab work.  She will continue her current regimen.  Hypothyroidism Continue Synthroid.  Check TSH with labs.  Cramps of lower extremity Check electrolytes and magnesium.  Allergic rhinitis Well-controlled with current regimen.  She will continue this and monitor.  If worsening we could add Singulair.  Overweight (BMI 25.0-29.9) Patient asked about the optavia diet and plexus supplement.  I discussed that I typically do not recommend any supplements as they are unregulated and it is difficult to know what exactly is in them as well as how they interact with medications.  I also do not usually recommend fad diets as they are hard to stick with the people typically gaining weight back after stopping the diet.    I discussed the assessment and treatment plan with the patient. The patient was provided an opportunity to ask questions and all were answered. The patient agreed with the plan and demonstrated an understanding of the instructions.   The patient was advised to call back or seek an in-person evaluation if the  symptoms worsen or if the condition fails to improve as anticipated.  I provided 19 minutes of non-face-to-face time during this encounter.   Tommi Rumps, MD

## 2019-02-14 NOTE — Assessment & Plan Note (Signed)
Adequately controlled.  Continue current regimen.  She will come in for lab work. 

## 2019-02-14 NOTE — Assessment & Plan Note (Signed)
Patient asked about the optavia diet and plexus supplement.  I discussed that I typically do not recommend any supplements as they are unregulated and it is difficult to know what exactly is in them as well as how they interact with medications.  I also do not usually recommend fad diets as they are hard to stick with the people typically gaining weight back after stopping the diet.

## 2019-02-14 NOTE — Assessment & Plan Note (Signed)
Well-controlled with current regimen.  She will continue this and monitor.  If worsening we could add Singulair.

## 2019-02-14 NOTE — Assessment & Plan Note (Signed)
Continue Synthroid.  Check TSH with labs.

## 2019-02-14 NOTE — Assessment & Plan Note (Signed)
Seems to be well controlled.  We will have her come in for lab work.  She will continue her current regimen.

## 2019-02-14 NOTE — Assessment & Plan Note (Signed)
Check electrolytes and magnesium. 

## 2019-02-17 ENCOUNTER — Ambulatory Visit (INDEPENDENT_AMBULATORY_CARE_PROVIDER_SITE_OTHER): Payer: Medicare Other | Admitting: Pharmacist

## 2019-02-17 DIAGNOSIS — E1165 Type 2 diabetes mellitus with hyperglycemia: Secondary | ICD-10-CM | POA: Diagnosis not present

## 2019-02-17 NOTE — Chronic Care Management (AMB) (Signed)
Chronic Care Management   Follow Up Note   02/17/2019 Name: Whitney Knight MRN: [id number] DOB: 1950-08-30  Referred by: Leone Haven, MD Reason for referral : Chronic Care Management (Medication Management)   Whitney WHEELAND is a 68 y.o. year old female who is a primary care patient of [name], [name], MD. The CCM team was consulted for assistance with chronic disease management and care coordination needs.    Contacted patient for medication management review today.   Review of patient status, including review of consultants reports, relevant laboratory and other test results, and collaboration with appropriate care team members and the patient's provider was performed as part of comprehensive patient evaluation and provision of chronic care management services.    SDOH (Social Determinants of Health) screening performed today: Physical Activity. See Care Plan for related entries.   Advanced Directives Status: N See Care Plan and Vynca application for related entries.  Outpatient Encounter Medications as of 02/17/2019  Medication Sig Note  . cetirizine (ZYRTEC) 10 MG tablet Take 10 mg by mouth daily.   . fluticasone (FLONASE) 50 MCG/ACT nasal spray Place 2 sprays into both nostrils daily.   Whitney Knight glimepiride (AMARYL) 2 MG tablet Take 1 tablet (2 mg total) by mouth daily with breakfast. (Patient taking differently: Take 2 mg by mouth at bedtime as needed. )   . liraglutide (VICTOZA) 18 MG/3ML SOPN INJECT SUBCUTANEOUSLY 1.8MG DAILY   . metFORMIN (GLUCOPHAGE) 1000 MG tablet Take 1 tablet (1,000 mg total) by mouth 2 (two) times daily with a meal.   . Zoledronic Acid (RECLAST IV) Inject into the vein. Once a year   . Betamethasone Valerate 0.12 % foam Apply to affected area once daily for up to 7 days (Patient taking differently: Apply to affected area once daily for up to 7 days-- as needed)   . calcium carbonate (TUMS EX) 750 MG chewable tablet Chew by mouth as needed.    [name] Sodium 30-100 MG CAPS Take 1 tablet by mouth daily. 12/19/2018: Occasionally every night  . cholecalciferol (VITAMIN D) 1000 UNITS tablet Take 1,000 Units by mouth daily.    . Emollient (COLLAGEN EX) liquid   . famotidine (PEPCID) 20 MG tablet Take 1/2 tablet by mouth daily at bedtime   . FEROSUL 325 (65 Fe) MG tablet TAKE ONE TABLET BY MOUTH EVERY DAY WITH BREAKFAST   . glucose blood (ONE TOUCH ULTRA TEST) test strip CHECK BLOOD SUGAR TWICE DAILY. Dx code E11.9, Brand: One touch Ultra, NPI: [id number]   . hydrochlorothiazide (MICROZIDE) 12.5 MG capsule TAKE 1 CAPSULE EVERY DAY   . Insulin Pen Needle (BD PEN NEEDLE NANO U/F) 32G X 4 MM MISC Inject daily as directed   . levothyroxine (SYNTHROID) 75 MCG tablet TAKE ONE TABLET EVERY DAY   . losartan (COZAAR) 100 MG tablet TAKE ONE TABLET EVERY DAY   . Multiple Vitamins-Minerals (WOMENS MULTIVITAMIN PO) Take by mouth.   Whitney Knight omeprazole (PRILOSEC) 40 MG capsule Take 1 capsule (40 mg total) by mouth daily for 30 days.   Whitney Knight Delica Lancets 75Z MISC USE 2 TIMES DAILY AS  DIRECTED   . oxybutynin (DITROPAN-XL) 5 MG 24 hr tablet Take 1 tablet (5 mg total) by mouth daily.   Whitney Knight POTASSIUM CHLORIDE PO Take 99 mg by mouth. Taking OTC, unsure mg    . Probiotic Product (PROBIOTIC DAILY PO) Take by mouth.    No facility-administered encounter medications on file as of 02/17/2019.  Goals Addressed            This Visit's Progress     Patient Stated   . "I want to work on my blood sugars" (pt-stated)       Current Barriers:  . Diabetes; uncontrolled; last A1c 7.0% o Notes that work has been extremely stressful, and she is tired of working from home. Reports multiple days of working 9-10 + hour days o Reports that she is evaluating drug plans for next year regarding coverage of Victoza . Current antihyperglycemic regimen: metformin 1000 mg BID, Victoza 1.8 mg once daily; glimepiride ~1 mg PRN higher sugars at bedtime  . Denies  hypoglycemic episodes . Dietary choices:  o Reiterates about 2 fad diets that she discussed with Dr. [name]. Notes that her daughter has lost ~20-30 lbs with a diet recently  o Notes that she has met with dieticians in the past, and didn't feel like she experienced much benefit. . Current exercise: Admits she has not been exercising; notes she has been having some swelling her in legs, treated w/ compression stockings and elevation o Reports allergies improved w/ a Vicks saline sinus rinse  . Current blood glucose readings: Fasting: 100-110s;  o Reports 1 episode of fasting 150, but had a snack before bed . Cardiovascular risk reduction: o Current hypertensive regimen: losartan 100 mg daily, HCTZ 12.5 mg daily  o Current hyperlipidemia regimen: none; 10 year ASCVD risk 10.3%  Pharmacist Clinical Goal(s):  Whitney Knight Over the next 90 days, patient with work with PharmD and primary care provider to address optimized diabetes management  Interventions: . Congratulated patient on continued maintenance of BG  . Discussed need to remain active, even while working at home. Discussed strategies of taking breaks throughout the day and going on walks. . Reiterated that we do not have evidence to back any "fad" diets, and that generally, they do not result in maintained weight loss. Patient verbalizes understanding . Discussed statin therapy for primary prevention in diabetes. Patient will have lipid panel checked in next lab work; recommend at least moderate intensity statin    Patient Self Care Activities:  . Patient will check blood glucose BID , document, and provide at future appointments . Patient will take medications as prescribed . Patient will report any questions or concerns to provider   Please see past updates related to this goal by clicking on the "Past Updates" button in the selected goal          Plan:  - Will outreach patient in the next 4-6 weeks for continued medication  management support  Catie Darnelle Maffucci, PharmD, [place] Pharmacist [place] [phone]

## 2019-02-17 NOTE — Patient Instructions (Signed)
Visit Information  Goals Addressed            This Visit's Progress     Patient Stated   . "I want to work on my blood sugars" (pt-stated)       Current Barriers:  . Diabetes; uncontrolled; last A1c 7.0% o Notes that work has been extremely stressful, and she is tired of working from home. Reports multiple days of working 9-10 + hour days o Reports that she is evaluating drug plans for next year regarding coverage of Victoza . Current antihyperglycemic regimen: metformin 1000 mg BID, Victoza 1.8 mg once daily; glimepiride ~1 mg PRN higher sugars at bedtime  . Denies hypoglycemic episodes . Dietary choices:  o Reiterates about 2 fad diets that she discussed with Dr. [name]. Notes that her daughter has lost ~20-30 lbs with a diet recently  o Notes that she has met with dieticians in the past, and didn't feel like she experienced much benefit. . Current exercise: Admits she has not been exercising; notes she has been having some swelling her in legs, treated w/ compression stockings and elevation o Reports allergies improved w/ a Vicks saline sinus rinse  . Current blood glucose readings: Fasting: 100-110s;  o Reports 1 episode of fasting 150, but had a snack before bed . Cardiovascular risk reduction: o Current hypertensive regimen: losartan 100 mg daily, HCTZ 12.5 mg daily  o Current hyperlipidemia regimen: none; 10 year ASCVD risk 10.3%  Pharmacist Clinical Goal(s):  [name] Over the next 90 days, patient with work with PharmD and primary care provider to address optimized diabetes management  Interventions: . Congratulated patient on continued maintenance of BG  . Discussed need to remain active, even while working at home. Discussed strategies of taking breaks throughout the day and going on walks. . Reiterated that we do not have evidence to back any "fad" diets, and that generally, they do not result in maintained weight loss. Patient verbalizes understanding . Discussed statin  therapy for primary prevention in diabetes. Patient will have lipid panel checked in next lab work; recommend at least moderate intensity statin    Patient Self Care Activities:  . Patient will check blood glucose BID , document, and provide at future appointments . Patient will take medications as prescribed . Patient will report any questions or concerns to provider   Please see past updates related to this goal by clicking on the "Past Updates" button in the selected goal         The patient verbalized understanding of instructions provided today and declined a print copy of patient instruction materials.   Plan:  - Will outreach patient in the next 4-6 weeks for continued medication management support  Catie Darnelle Maffucci, PharmD, [place] Pharmacist [place] [phone]

## 2019-03-05 ENCOUNTER — Encounter: Payer: Self-pay | Admitting: Family Medicine

## 2019-03-05 ENCOUNTER — Other Ambulatory Visit: Payer: Self-pay

## 2019-03-05 MED ORDER — FLUTICASONE PROPIONATE 50 MCG/ACT NA SUSP: 2.0000 | Freq: Every day | NASAL | 1 refills | Status: DC

## 2019-03-05 NOTE — Telephone Encounter (Signed)
Patient sent a my chart message asking for a refill on flonase, this was done and sent to pharmacy.  Nina,cma

## 2019-03-10 ENCOUNTER — Encounter: Payer: Self-pay | Admitting: Family Medicine

## 2019-03-10 ENCOUNTER — Other Ambulatory Visit: Payer: Self-pay | Admitting: Family Medicine

## 2019-03-11 MED ORDER — FERROUS SULFATE 325 (65 FE) MG PO TABS: ORAL_TABLET | ORAL | 1 refills | Status: DC

## 2019-03-13 ENCOUNTER — Ambulatory Visit (INDEPENDENT_AMBULATORY_CARE_PROVIDER_SITE_OTHER): Payer: Medicare Other

## 2019-03-13 ENCOUNTER — Other Ambulatory Visit (INDEPENDENT_AMBULATORY_CARE_PROVIDER_SITE_OTHER): Payer: Medicare Other

## 2019-03-13 ENCOUNTER — Other Ambulatory Visit: Payer: Self-pay

## 2019-03-13 DIAGNOSIS — I10 Essential (primary) hypertension: Secondary | ICD-10-CM | POA: Diagnosis not present

## 2019-03-13 DIAGNOSIS — E039 Hypothyroidism, unspecified: Secondary | ICD-10-CM

## 2019-03-13 DIAGNOSIS — E1165 Type 2 diabetes mellitus with hyperglycemia: Secondary | ICD-10-CM

## 2019-03-13 DIAGNOSIS — Z23 Encounter for immunization: Secondary | ICD-10-CM

## 2019-03-13 DIAGNOSIS — R252 Cramp and spasm: Secondary | ICD-10-CM | POA: Diagnosis not present

## 2019-03-14 LAB — COMPREHENSIVE METABOLIC PANEL
ALT: 33 IU/L — ABNORMAL HIGH (ref 0–32)
AST: 22 IU/L (ref 0–40)
Albumin/Globulin Ratio: 1.6 (ref 1.2–2.2)
Albumin: 4.4 g/dL (ref 3.8–4.8)
Alkaline Phosphatase: 78 IU/L (ref 39–117)
BUN/Creatinine Ratio: 9 — ABNORMAL LOW (ref 12–28)
BUN: 11 mg/dL (ref 8–27)
Bilirubin Total: 0.5 mg/dL (ref 0.0–1.2)
CO2: 28 mmol/L (ref 20–29)
Calcium: 9.9 mg/dL (ref 8.7–10.3)
Chloride: 98 mmol/L (ref 96–106)
Creatinine, Ser: 1.2 mg/dL — ABNORMAL HIGH (ref 0.57–1.00)
GFR calc Af Amer: 54 mL/min/{1.73_m2} — ABNORMAL LOW (ref >59)
GFR calc non Af Amer: 47 mL/min/{1.73_m2} — ABNORMAL LOW (ref >59)
Globulin, Total: 2.8 g/dL (ref 1.5–4.5)
Glucose: 90 mg/dL (ref 65–99)
Potassium: 4.8 mmol/L (ref 3.5–5.2)
Sodium: 140 mmol/L (ref 134–144)
Total Protein: 7.2 g/dL (ref 6.0–8.5)

## 2019-03-14 LAB — TSH: TSH: 2.13 u[IU]/mL (ref 0.450–4.500)

## 2019-03-14 LAB — LIPID PANEL
Chol/HDL Ratio: 7.6 ratio — ABNORMAL HIGH (ref 0.0–4.4)
Cholesterol, Total: 251 mg/dL — ABNORMAL HIGH (ref 100–199)
HDL: 33 mg/dL — ABNORMAL LOW (ref >39)
LDL Chol Calc (NIH): 166 mg/dL — ABNORMAL HIGH (ref 0–99)
Triglycerides: 275 mg/dL — ABNORMAL HIGH (ref 0–149)
VLDL Cholesterol Cal: 52 mg/dL — ABNORMAL HIGH (ref 5–40)

## 2019-03-14 LAB — HEMOGLOBIN A1C
Est. average glucose Bld gHb Est-mCnc: 169 mg/dL
Hgb A1c MFr Bld: 7.5 % — ABNORMAL HIGH (ref 4.8–5.6)

## 2019-03-14 LAB — MAGNESIUM: Magnesium: 1.8 mg/dL (ref 1.6–2.3)

## 2019-03-17 ENCOUNTER — Other Ambulatory Visit: Payer: Self-pay

## 2019-03-22 ENCOUNTER — Other Ambulatory Visit: Payer: Self-pay | Admitting: Family Medicine

## 2019-03-22 DIAGNOSIS — E119 Type 2 diabetes mellitus without complications: Secondary | ICD-10-CM

## 2019-03-24 ENCOUNTER — Telehealth: Payer: Medicare Other

## 2019-03-24 ENCOUNTER — Ambulatory Visit: Payer: Self-pay | Admitting: Pharmacist

## 2019-03-24 MED ORDER — BD PEN NEEDLE NANO U/F 32G X 4 MM MISC: 0 refills | Status: DC

## 2019-03-24 NOTE — Chronic Care Management (AMB) (Signed)
  Chronic Care Management   Note  03/24/2019 Name: Whitney Knight MRN: [id number] DOB: 03/16/1951  [name] is a 68 y.o. year old female who is a primary care patient of [name], [name], MD. The CCM team was consulted for assistance with chronic disease management and care coordination needs.    Attempted to contact patient for medication management review. Left HIPAA compliant message for patient to return my call at her convenience.   Follow up plan: - If I do not hear back, will attempt outreach again in the next 2-3 weeks  Catie Darnelle Maffucci, PharmD, [place] Pharmacist [place] [phone]

## 2019-03-24 NOTE — Progress Notes (Signed)
Reviewed information.  Agree with plan.    Dr Lottie Siska 

## 2019-04-05 ENCOUNTER — Encounter: Payer: Self-pay | Admitting: Family Medicine

## 2019-04-10 ENCOUNTER — Telehealth: Payer: Medicare Other

## 2019-04-11 ENCOUNTER — Other Ambulatory Visit: Payer: Medicare Other

## 2019-04-14 ENCOUNTER — Ambulatory Visit (INDEPENDENT_AMBULATORY_CARE_PROVIDER_SITE_OTHER): Payer: Medicare Other | Admitting: Pharmacist

## 2019-04-14 DIAGNOSIS — E1165 Type 2 diabetes mellitus with hyperglycemia: Secondary | ICD-10-CM | POA: Diagnosis not present

## 2019-04-14 DIAGNOSIS — E119 Type 2 diabetes mellitus without complications: Secondary | ICD-10-CM

## 2019-04-14 MED ORDER — GLUCOSE BLOOD VI STRP: ORAL_STRIP | 3 refills | Status: DC

## 2019-04-14 NOTE — Patient Instructions (Addendum)
Visit Information  Ms. Banken,   It was great talking to you today!  We talked about a few things:   1) Continue metformin 1000 mg twice daily and Victoza 1.8 mg daily.  2) Check blood sugars 1) fasting and 2) 2 hours after a meal. Goal fasting glucose is <130 and goal 2 hour after meal glucose is <180. These correlate with our goal A1c of <7%  3) Enclosed is the [place] [employer] handout we discussed. Take a look at the last page, which reviews appropriate serving sizes of multiple carbohydrates.   We strongly urge you to consider starting a statin medication to reduce your risk of heart disease (heart attacks, stokes) due to diabetes and uncontrolled cholesterol. Most of my patients do not have any side effects of this class of medications; However, if you are concerned, I would recommend [place] starting with rosuvastatin - this statin tends to have a lower risk of muscle symptoms.   Feel free to give me a call if you have any questions or concerns!   Visit Information  Goals Addressed            This Visit's Progress     Patient Stated   . "I want to work on my blood sugars" (pt-stated)       Current Barriers:  . Diabetes; uncontrolled; last A1c 7.5% . Current antihyperglycemic regimen: metformin 1000 mg BID, Victoza 1.8 mg once daily; glimepiride ~1 mg PRN higher sugars at bedtime  . Denies hypoglycemic episodes; does note some readings in the 60s . Dietary choices:  o Breakfast: protein shake;  o Snack at 10 am: yogurt . Current exercise: Notes that she has not been exercising, but has been walking up and down the steps  . Current blood glucose readings:  o Fastings: 99-130s o Late night: 180s, 230s 121  . Cardiovascular risk reduction: o Current hypertensive regimen: losartan 100 mg daily, HCTZ 12.5 mg daily  o Current hyperlipidemia regimen: OTC omega 3 fatty acids; 10 year ASCVD risk 18.1%; last LDL 166, last TG 275  Pharmacist Clinical Goal(s):  [name] Over the next 90  days, patient with work with PharmD and primary care provider to address optimized diabetes management  Interventions: . Comprehensive medication management review performed, medication list updated in electronic medical record . Reviewed goal A1c, goal fasting, and goal 2 hour post prandial glucose readings. Encouraged patient to check readings after breakfast, lunch, supper, alternating meals, to evaluate carbohydrate content of meals. She requested I increase the frequency of testing for her test strips- have increased to allow to check up to 4 times daily.  . Mailing Healthy Meal Planning handout to patient. Extensive discussion of the importance of focus on diet and exercise.  . Reviewed her documented hx of "nausea & vomiting" with Jardiance. Consider trial of alternative SGLT2 as next steps, if next A1c not at goal w/ focus on diet/exercise.  . Patient extremely resistant to statin therapy. Explained risks of elevated LDL, and additional risks of ACSVD d/t her diagnosis of DM. Explained that diabetes alone gives her indication for statin therapy. Discussed that some statins (eg rosuvastatin) have a lower incidence of muscle symptoms. Will continue to review benefits of statin therapy w/ patient moving forward.    Patient Self Care Activities:  . Patient will check blood glucose up to QID , document, and provide at future appointments . Patient will take medications as prescribed . Patient will report any questions or concerns to provider  Please see past updates related to this goal by clicking on the "Past Updates" button in the selected goal         Print copy of patient instructions provided.   Plan: - Scheduled f/u phone call in ~ 4 weeks (05/15/2019 at 2:30 pm)  Catie Darnelle Maffucci, PharmD, [place], [place] [phone]

## 2019-04-14 NOTE — Chronic Care Management (AMB) (Signed)
Chronic Care Management   Follow Up Note   04/14/2019 Name: Whitney Knight MRN: [id number] DOB: 1950-09-25  Referred by: Leone Haven, MD Reason for referral : Chronic Care Management (Medication Management)   Whitney Knight is a 68 y.o. year old female who is a primary care patient of [name], [name], MD. The CCM team was consulted for assistance with chronic disease management and care coordination needs.    Contacted patient for medication management review.   Review of patient status, including review of consultants reports, relevant laboratory and other test results, and collaboration with appropriate care team members and the patient's provider was performed as part of comprehensive patient evaluation and provision of chronic care management services.    SDOH (Social Determinants of Health) screening performed today: Physical Activity. See Care Plan for related entries.   Outpatient Encounter Medications as of 04/14/2019  Medication Sig Note  . cetirizine (ZYRTEC) 10 MG tablet Take 10 mg by mouth daily.   . cholecalciferol (VITAMIN D) 1000 UNITS tablet Take 1,000 Units by mouth daily.    . famotidine (PEPCID) 20 MG tablet Take 1/2 tablet by mouth daily at bedtime   . ferrous sulfate (FEROSUL) 325 (65 FE) MG tablet TAKE ONE TABLET BY MOUTH EVERY DAY WITH BREAKFAST   . fluticasone (FLONASE) 50 MCG/ACT nasal spray Place 2 sprays into both nostrils daily.   [name] glimepiride (AMARYL) 2 MG tablet Take 1 tablet (2 mg total) by mouth daily with breakfast. (Patient taking differently: Take 2 mg by mouth at bedtime as needed. )   . glucose blood (ONE TOUCH ULTRA TEST) test strip CHECK BLOOD SUGAR up . Dx code E11.9, Brand: One touch Ultra, NPI: [id number]   . hydrochlorothiazide (MICROZIDE) 12.5 MG capsule TAKE 1 CAPSULE EVERY DAY   . Insulin Pen Needle (BD PEN NEEDLE NANO U/F) 32G X 4 MM MISC Inject daily as directed   . levothyroxine (SYNTHROID) 75 MCG tablet TAKE ONE TABLET  EVERY DAY   . liraglutide (VICTOZA) 18 MG/3ML SOPN INJECT SUBCUTANEOUSLY 1.8MG  DAILY   . losartan (COZAAR) 100 MG tablet TAKE ONE TABLET EVERY DAY   . metFORMIN (GLUCOPHAGE) 1000 MG tablet Take 1 tablet (1,000 mg total) by mouth 2 (two) times daily with a meal.   . Multiple Vitamins-Minerals (WOMENS MULTIVITAMIN PO) Take by mouth.   . Omega-3 Fatty Acids (FISH OIL) 500 MG CAPS Take 500 tablets by mouth.   [name] omeprazole (PRILOSEC) 40 MG capsule Take 1 capsule (40 mg total) by mouth daily for 30 days.   Whitney Knight [id number] MISC USE 2 TIMES DAILY AS  DIRECTED   . oxybutynin (DITROPAN-XL) 5 MG 24 hr tablet Take 1 tablet (5 mg total) by mouth daily.   [name] POTASSIUM CHLORIDE PO Take 99 mg by mouth. Taking OTC, unsure mg    . Probiotic Product (PROBIOTIC DAILY PO) Take by mouth.   . Zoledronic Acid (RECLAST IV) Inject into the vein. Once a year   . [DISCONTINUED] glucose blood (ONE TOUCH ULTRA TEST) test strip CHECK BLOOD SUGAR TWICE DAILY. Dx code E11.9, Brand: One touch Ultra, NPI: [id number]   . Betamethasone Valerate 0.12 % foam Apply to affected area once daily for up to 7 days (Patient not taking: Reported on 04/14/2019)   . calcium carbonate (TUMS EX) 750 MG chewable tablet Chew by mouth as needed.    [name] Sodium 30-100 MG CAPS Take 1 tablet by mouth daily. (Patient not taking: Reported on 04/14/2019) 12/19/2018:  Occasionally every night  . Emollient (COLLAGEN EX) liquid    No facility-administered encounter medications on file as of 04/14/2019.     Goals Addressed            This Visit's Progress     Patient Stated   . "I want to work on my blood sugars" (pt-stated)       Current Barriers:  . Diabetes; uncontrolled; last A1c 7.5% . Current antihyperglycemic regimen: metformin 1000 mg BID, Victoza 1.8 mg once daily; glimepiride ~1 mg PRN higher sugars at bedtime  . Denies hypoglycemic episodes; does note some readings in the 60s . Dietary choices:   o Breakfast: protein shake;  o Snack at 10 am: yogurt . Current exercise: Notes that she has not been exercising, but has been walking up and down the steps  . Current blood glucose readings:  o Fastings: 99-130s o Late night: 180s, 230s 121  . Cardiovascular risk reduction: o Current hypertensive regimen: losartan 100 mg daily, HCTZ 12.5 mg daily  o Current hyperlipidemia regimen: OTC omega 3 fatty acids; 10 year ASCVD risk 18.1%; last LDL 166, last TG 275  Pharmacist Clinical Goal(s):  [name] Over the next 90 days, patient with work with PharmD and primary care provider to address optimized diabetes management  Interventions: . Comprehensive medication management review performed, medication list updated in electronic medical record . Reviewed goal A1c, goal fasting, and goal 2 hour post prandial glucose readings. Encouraged patient to check readings after breakfast, lunch, supper, alternating meals, to evaluate carbohydrate content of meals. She requested I increase the frequency of testing for her test strips- have increased to allow to check up to 4 times daily.  . Mailing Healthy Meal Planning handout to patient. Extensive discussion of the importance of focus on diet and exercise.  . Reviewed her documented hx of "nausea & vomiting" with Jardiance. Consider trial of alternative SGLT2 as next steps, if next A1c not at goal w/ focus on diet/exercise.  . Patient extremely resistant to statin therapy. Explained risks of elevated LDL, and additional risks of ACSVD d/t her diagnosis of DM. Explained that diabetes alone gives her indication for statin therapy. Discussed that some statins (eg rosuvastatin) have a lower incidence of muscle symptoms. Will continue to review benefits of statin therapy w/ patient moving forward.    Patient Self Care Activities:  . Patient will check blood glucose up to QID , document, and provide at future appointments . Patient will take medications as  prescribed . Patient will report any questions or concerns to provider   Please see past updates related to this goal by clicking on the "Past Updates" button in the selected goal         Plan: - Scheduled f/u phone call in ~ 4 weeks (05/15/2019 at 2:30 pm)  Catie Darnelle Maffucci, PharmD, [place], [place] Pharmacist [place] [phone]

## 2019-04-15 ENCOUNTER — Other Ambulatory Visit: Payer: Self-pay | Admitting: Family Medicine

## 2019-04-24 ENCOUNTER — Other Ambulatory Visit: Payer: Medicare Other

## 2019-05-04 ENCOUNTER — Other Ambulatory Visit: Payer: Self-pay | Admitting: Family Medicine

## 2019-05-05 ENCOUNTER — Other Ambulatory Visit: Payer: Self-pay | Admitting: Family Medicine

## 2019-05-05 ENCOUNTER — Encounter: Payer: Self-pay | Admitting: Family Medicine

## 2019-05-05 MED ORDER — HYDROCHLOROTHIAZIDE 12.5 MG PO CAPS: 12.5000 mg | ORAL_CAPSULE | Freq: Every day | ORAL | 0 refills | Status: DC

## 2019-05-08 ENCOUNTER — Telehealth: Payer: Self-pay | Admitting: *Deleted

## 2019-05-08 DIAGNOSIS — N179 Acute kidney failure, unspecified: Secondary | ICD-10-CM

## 2019-05-08 NOTE — Telephone Encounter (Signed)
Please place future orders for lab appt.  

## 2019-05-08 NOTE — Telephone Encounter (Signed)
Ordered

## 2019-05-12 ENCOUNTER — Telehealth: Payer: Medicare Other

## 2019-05-12 ENCOUNTER — Other Ambulatory Visit (INDEPENDENT_AMBULATORY_CARE_PROVIDER_SITE_OTHER): Payer: Medicare Other

## 2019-05-12 ENCOUNTER — Other Ambulatory Visit: Payer: Self-pay

## 2019-05-12 DIAGNOSIS — N179 Acute kidney failure, unspecified: Secondary | ICD-10-CM | POA: Diagnosis not present

## 2019-05-13 LAB — COMPREHENSIVE METABOLIC PANEL
ALT: 34 IU/L — ABNORMAL HIGH (ref 0–32)
AST: 41 IU/L — ABNORMAL HIGH (ref 0–40)
Albumin/Globulin Ratio: 1.7 (ref 1.2–2.2)
Albumin: 4.3 g/dL (ref 3.8–4.8)
Alkaline Phosphatase: 100 IU/L (ref 39–117)
BUN/Creatinine Ratio: 9 — ABNORMAL LOW (ref 12–28)
BUN: 9 mg/dL (ref 8–27)
Bilirubin Total: 0.5 mg/dL (ref 0.0–1.2)
CO2: 25 mmol/L (ref 20–29)
Calcium: 9.3 mg/dL (ref 8.7–10.3)
Chloride: 96 mmol/L (ref 96–106)
Creatinine, Ser: 0.98 mg/dL (ref 0.57–1.00)
GFR calc Af Amer: 70 mL/min/{1.73_m2} (ref >59)
GFR calc non Af Amer: 60 mL/min/{1.73_m2} (ref >59)
Globulin, Total: 2.6 g/dL (ref 1.5–4.5)
Glucose: 124 mg/dL — ABNORMAL HIGH (ref 65–99)
Potassium: 3.9 mmol/L (ref 3.5–5.2)
Sodium: 137 mmol/L (ref 134–144)
Total Protein: 6.9 g/dL (ref 6.0–8.5)

## 2019-05-15 ENCOUNTER — Ambulatory Visit (INDEPENDENT_AMBULATORY_CARE_PROVIDER_SITE_OTHER): Payer: Medicare Other | Admitting: Pharmacist

## 2019-05-15 DIAGNOSIS — E119 Type 2 diabetes mellitus without complications: Secondary | ICD-10-CM

## 2019-05-15 DIAGNOSIS — E663 Overweight: Secondary | ICD-10-CM

## 2019-05-15 DIAGNOSIS — E1165 Type 2 diabetes mellitus with hyperglycemia: Secondary | ICD-10-CM | POA: Diagnosis not present

## 2019-05-15 MED ORDER — GLUCOSE BLOOD VI STRP: ORAL_STRIP | 3 refills | Status: DC

## 2019-05-15 NOTE — Chronic Care Management (AMB) (Signed)
Chronic Care Management   Follow Up Note   05/15/2019 Name: Whitney Knight MRN: [id number] DOB: [birth year]  Referred by: Leone Haven, MD Reason for referral : Chronic Care Management (Medication Management)   Whitney Knight is a 69 y.o. year old female who is a primary care patient of [name], [name], MD. The CCM team was consulted for assistance with chronic disease management and care coordination needs.    Contacted patient for medication management review today.   Review of patient status, including review of consultants reports, relevant laboratory and other test results, and collaboration with appropriate care team members and the patient's provider was performed as part of comprehensive patient evaluation and provision of chronic care management services.    SDOH (Social Determinants of Health) screening performed today: Physical Activity. See Care Plan for related entries.   Outpatient Encounter Medications as of 05/15/2019  Medication Sig Note  . calcium carbonate (TUMS EX) 750 MG chewable tablet Chew by mouth as needed.    [name] Sodium 30-100 MG CAPS Take 1 tablet by mouth daily. 12/19/2018: Occasionally every night  . cetirizine (ZYRTEC) 10 MG tablet Take 10 mg by mouth daily.   . cholecalciferol (VITAMIN D) 1000 UNITS tablet Take 1,000 Units by mouth daily.    . famotidine (PEPCID) 20 MG tablet Take 1/2 tablet by mouth daily at bedtime   . fluticasone (FLONASE) 50 MCG/ACT nasal spray Place 2 sprays into both nostrils daily.   [name] glimepiride (AMARYL) 2 MG tablet Take 1 tablet (2 mg total) by mouth daily with breakfast. (Patient taking differently: Take 2 mg by mouth at bedtime as needed. )   . glucose blood (ONE TOUCH ULTRA TEST) test strip CHECK BLOOD SUGAR up to four times daily. Dx code E11.9, Brand: One touch Ultra, NPI: [id number]   . hydrochlorothiazide (MICROZIDE) 12.5 MG capsule Take 1 capsule (12.5 mg total) by mouth daily.   . Insulin  Pen Needle (BD PEN NEEDLE NANO U/F) 32G X 4 MM MISC Inject daily as directed   . levothyroxine (SYNTHROID) 75 MCG tablet TAKE ONE TABLET EVERY DAY   . liraglutide (VICTOZA) 18 MG/3ML SOPN INJECT SUBCUTANEOUSLY 1.8MG  DAILY   . losartan (COZAAR) 100 MG tablet TAKE ONE TABLET BY MOUTH EVERY DAY   . metFORMIN (GLUCOPHAGE) 1000 MG tablet Take 1 tablet (1,000 mg total) by mouth 2 (two) times daily with a meal.   . Multiple Vitamins-Minerals (WOMENS MULTIVITAMIN PO) Take by mouth.   . Omega-3 Fatty Acids (FISH OIL) 500 MG CAPS Take 500 tablets by mouth.   [name] omeprazole (PRILOSEC) 40 MG capsule Take 1 capsule (40 mg total) by mouth daily for 30 days.   [name] 99991111 MISC USE 2 TIMES DAILY AS  DIRECTED   . oxybutynin (DITROPAN-XL) 5 MG 24 hr tablet Take 1 tablet (5 mg total) by mouth daily.   [name] POTASSIUM CHLORIDE PO Take 99 mg by mouth. Taking OTC, unsure mg    . Probiotic Product (PROBIOTIC DAILY PO) Take by mouth.   . Zoledronic Acid (RECLAST IV) Inject into the vein. Once a year   . Betamethasone Valerate 0.12 % foam Apply to affected area once daily for up to 7 days (Patient not taking: Reported on 04/14/2019)   . Emollient (COLLAGEN EX) liquid   . FEROSUL 325 (65 Fe) MG tablet TAKE ONE TABLET EACH MORNING WITH BREAKFAST (Patient not taking: Reported on 05/15/2019)   . [DISCONTINUED] glucose blood (ONE TOUCH ULTRA TEST) test  strip CHECK BLOOD SUGAR up . Dx code E11.9, Brand: One touch Ultra, NPI: [id number]    No facility-administered encounter medications on file as of 05/15/2019.     Goals Addressed            This Visit's Progress     Patient Stated   . "I want to work on my blood sugars" (pt-stated)       Current Barriers:  . Diabetes; uncontrolled; last A1c 7.5% o Has focused significantly on lifestyle changes over the past few weeks. Notes that her daughter is the Glass blower/designer for [employer], and is helping her remain accountable to reducing carbohydrates . Current  antihyperglycemic regimen: metformin 1000 mg BID, Victoza 1.8 mg once daily; glimepiride ~1 mg PRN higher sugars at bedtime  . Dietary choices: o Baked chips instead of potato chips o Breakfast: protein shake, sometimes little bite blueberry muffins or pecan twirls;  o Lunch: chicken nuggets and baked lays;  o Supper: protein, vegetables; if her husband and son get take out, she eats something she has around the house. Sits separately from them to avoid being jealous of what they are eating. . Current exercise: Got new tennis shoes for Christmas; since it's cold outside, she has been walking around the house, 10-15 minutes 2-3 times daily. Plans to move it outside and for longer when the weather improves  . Cardiovascular risk reduction: o Current hypertensive regimen: losartan 100 mg daily, HCTZ 12.5 mg daily; has not checked BP at home recently.   o Current hyperlipidemia regimen: OTC omega 3 fatty acids; 10 year ASCVD risk 18.1%; last LDL 166, last TG 275; resistant to statin therapy, as she wants to see the impact of lifestyle changes on her lab work first  Pharmacist Clinical Goal(s):  [name] Over the next 90 days, patient with work with PharmD and primary care provider to address optimized diabetes management  Interventions: . Comprehensive medication management review performed, medication list updated in electronic medical record . Praised patient for commitment to lifestyle changes over the past few weeks, including focus on reducing processed foods, carbs, and snacks and moving more during the day, instead of staying seated at her desk all day. Encouraged continued incremental increases in exercise with each week.  . Will defer any changes until after A1c result at next appt w/ Dr. [name] in April.   Patient Self Care Activities:  . Patient will check blood glucose up to QID , document, and provide at future appointments . Patient will take medications as prescribed . Patient will  report any questions or concerns to provider   Please see past updates related to this goal by clicking on the "Past Updates" button in the selected goal          Plan: - Scheduled f/u call 07/10/19 @ 2 pm  Catie Darnelle Maffucci, PharmD, [place], [place] Pharmacist [place] [phone]

## 2019-05-15 NOTE — Patient Instructions (Signed)
Visit Information  Goals Addressed            This Visit's Progress     Patient Stated   . "I want to work on my blood sugars" (pt-stated)       Current Barriers:  . Diabetes; uncontrolled; last A1c 7.5% o Has focused significantly on lifestyle changes over the past few weeks. Notes that her daughter is the Glass blower/designer for [employer], and is helping her remain accountable to reducing carbohydrates . Current antihyperglycemic regimen: metformin 1000 mg BID, Victoza 1.8 mg once daily; glimepiride ~1 mg PRN higher sugars at bedtime  . Dietary choices: o Baked chips instead of potato chips o Breakfast: protein shake, sometimes little bite blueberry muffins or pecan twirls;  o Lunch: chicken nuggets and baked lays;  o Supper: protein, vegetables; if her husband and son get take out, she eats something she has around the house. Sits separately from them to avoid being jealous of what they are eating. . Current exercise: Got new tennis shoes for Christmas; since it's cold outside, she has been walking around the house, 10-15 minutes 2-3 times daily. Plans to move it outside and for longer when the weather improves  . Cardiovascular risk reduction: o Current hypertensive regimen: losartan 100 mg daily, HCTZ 12.5 mg daily; has not checked BP at home recently.   o Current hyperlipidemia regimen: OTC omega 3 fatty acids; 10 year ASCVD risk 18.1%; last LDL 166, last TG 275; resistant to statin therapy, as she wants to see the impact of lifestyle changes on her lab work first  Pharmacist Clinical Goal(s):  [name] Over the next 90 days, patient with work with PharmD and primary care provider to address optimized diabetes management  Interventions: . Comprehensive medication management review performed, medication list updated in electronic medical record . Praised patient for commitment to lifestyle changes over the past few weeks, including focus on reducing processed foods, carbs, and snacks and  moving more during the day, instead of staying seated at her desk all day. Encouraged continued incremental increases in exercise with each week.  . Will defer any changes until after A1c result at next appt w/ Dr. [name] in April.   Patient Self Care Activities:  . Patient will check blood glucose up to QID , document, and provide at future appointments . Patient will take medications as prescribed . Patient will report any questions or concerns to provider   Please see past updates related to this goal by clicking on the "Past Updates" button in the selected goal         The patient verbalized understanding of instructions provided today and declined a print copy of patient instruction materials.   Plan: - Scheduled f/u call 07/10/19 @ 2 pm  Catie Darnelle Maffucci, PharmD, [place], CPP Clinical Pharmacist [place] [phone]

## 2019-05-20 ENCOUNTER — Other Ambulatory Visit: Payer: Self-pay | Admitting: Family Medicine

## 2019-05-20 DIAGNOSIS — R945 Abnormal results of liver function studies: Secondary | ICD-10-CM

## 2019-05-20 DIAGNOSIS — R7989 Other specified abnormal findings of blood chemistry: Secondary | ICD-10-CM

## 2019-05-27 ENCOUNTER — Other Ambulatory Visit: Payer: Self-pay

## 2019-05-27 DIAGNOSIS — E1165 Type 2 diabetes mellitus with hyperglycemia: Secondary | ICD-10-CM

## 2019-05-27 MED ORDER — VICTOZA 18 MG/3ML ~~LOC~~ SOPN: PEN_INJECTOR | SUBCUTANEOUS | 3 refills | Status: DC

## 2019-05-27 NOTE — Telephone Encounter (Signed)
Received a fax from [facility] asking for a 90 supply of Victoza and this was sent to the pharmacy electronically.  Gitel Beste,cma

## 2019-06-03 ENCOUNTER — Encounter: Payer: Self-pay | Admitting: Family Medicine

## 2019-06-03 DIAGNOSIS — E119 Type 2 diabetes mellitus without complications: Secondary | ICD-10-CM

## 2019-06-03 MED ORDER — OMEPRAZOLE 40 MG PO CPDR: 40.0000 mg | DELAYED_RELEASE_CAPSULE | Freq: Every day | ORAL | 0 refills | Status: DC

## 2019-06-03 MED ORDER — FAMOTIDINE 20 MG PO TABS: ORAL_TABLET | ORAL | 0 refills | Status: DC

## 2019-06-04 ENCOUNTER — Encounter: Payer: Self-pay | Admitting: Family Medicine

## 2019-06-04 ENCOUNTER — Other Ambulatory Visit: Payer: Self-pay | Admitting: Family Medicine

## 2019-06-10 MED ORDER — GLUCOSE BLOOD VI STRP: ORAL_STRIP | 3 refills | Status: DC

## 2019-06-10 NOTE — Telephone Encounter (Signed)
Patient called and said that Medicare has cancelled her test stripe prescription and Patient's provider needs to send a new prescription for her test stripes to [facility] fax number [phone]. Prescription needs patient's name, [facility] Plan # [id number] and DOB. Patient only has 8 stripes remaining.

## 2019-06-13 ENCOUNTER — Other Ambulatory Visit: Payer: Self-pay

## 2019-06-13 ENCOUNTER — Other Ambulatory Visit (INDEPENDENT_AMBULATORY_CARE_PROVIDER_SITE_OTHER): Payer: Medicare Other

## 2019-06-13 ENCOUNTER — Telehealth: Payer: Self-pay | Admitting: Family Medicine

## 2019-06-13 DIAGNOSIS — R945 Abnormal results of liver function studies: Secondary | ICD-10-CM | POA: Diagnosis not present

## 2019-06-13 DIAGNOSIS — R7989 Other specified abnormal findings of blood chemistry: Secondary | ICD-10-CM

## 2019-06-13 NOTE — Telephone Encounter (Signed)
Form was faxed to pharmacy for patient.  Keddrick Wyne,cma

## 2019-06-13 NOTE — Telephone Encounter (Signed)
Pt called to speak with you about test strips. She states that they arent covered through mail order so it needs to be called into [facility] on [address] asap. Pt is out of strips.

## 2019-06-14 ENCOUNTER — Encounter: Payer: Self-pay | Admitting: Family Medicine

## 2019-06-14 LAB — HEPATIC FUNCTION PANEL
ALT: 32 IU/L (ref 0–32)
AST: 36 IU/L (ref 0–40)
Albumin: 3.9 g/dL (ref 3.8–4.8)
Alkaline Phosphatase: 115 IU/L (ref 39–117)
Bilirubin Total: 0.4 mg/dL (ref 0.0–1.2)
Bilirubin, Direct: 0.15 mg/dL (ref 0.00–0.40)
Total Protein: 6.3 g/dL (ref 6.0–8.5)

## 2019-06-17 ENCOUNTER — Encounter: Payer: Self-pay | Admitting: Family Medicine

## 2019-06-17 NOTE — Telephone Encounter (Signed)
Whitney Knight from [facility] called and states that she has been trying for a few weeks to get the CMN form filled out for test strips? Please advise [phone]

## 2019-06-18 ENCOUNTER — Encounter: Payer: Self-pay | Admitting: Family Medicine

## 2019-06-18 ENCOUNTER — Other Ambulatory Visit: Payer: Self-pay | Admitting: Family Medicine

## 2019-06-18 DIAGNOSIS — E119 Type 2 diabetes mellitus without complications: Secondary | ICD-10-CM

## 2019-06-18 MED ORDER — BD PEN NEEDLE NANO U/F 32G X 4 MM MISC: 0 refills | Status: DC

## 2019-06-19 ENCOUNTER — Encounter: Payer: Self-pay | Admitting: Family Medicine

## 2019-06-19 ENCOUNTER — Other Ambulatory Visit: Payer: Self-pay

## 2019-06-19 DIAGNOSIS — E119 Type 2 diabetes mellitus without complications: Secondary | ICD-10-CM

## 2019-06-19 MED ORDER — BD PEN NEEDLE NANO U/F 32G X 4 MM MISC: 5 refills | Status: DC

## 2019-06-20 ENCOUNTER — Other Ambulatory Visit: Payer: Self-pay | Admitting: Family Medicine

## 2019-07-08 ENCOUNTER — Other Ambulatory Visit: Payer: Self-pay | Admitting: Family Medicine

## 2019-07-10 ENCOUNTER — Ambulatory Visit (INDEPENDENT_AMBULATORY_CARE_PROVIDER_SITE_OTHER): Payer: Medicare Other | Admitting: Pharmacist

## 2019-07-10 DIAGNOSIS — E1165 Type 2 diabetes mellitus with hyperglycemia: Secondary | ICD-10-CM

## 2019-07-10 DIAGNOSIS — J302 Other seasonal allergic rhinitis: Secondary | ICD-10-CM

## 2019-07-10 NOTE — Patient Instructions (Signed)
Visit Information  Goals Addressed            This Visit's Progress     Patient Stated   . "I want to work on my blood sugars" (pt-stated)       [place] (see longtitudinal plan of care for additional care plan information)  Current Barriers:  . Diabetes; uncontrolled; last A1c 7.5% o Discusses recent concerns with filling test strips to test QID. Insurance plan told her Medicare would only page for TID testing. However, pharmacy last filled to test BID.  [name] Current antihyperglycemic regimen: metformin 1000 mg BID, Victoza 1.8 mg once daily; glimepiride ~1 mg PRN higher sugars at bedtime  o Intolerance: Jardiance, nausea/vomiting . Dietary choices:  o Using One Bar as meal replacement PRN . Current glucose readings:  o Does note some episodes of hypoglycemia in the evenings if she doesn't eat well during the day, even if she hasn't taken glimepiride o Fastings: 98-130s  o 2 hour post prandial: most are in range <180, notes occasional issues w/ higher readings, will take glimepiride to correct . Current exercise: Walking on the treadmill; goal is 15-20 minutes, 3-4 times weekly  . Cardiovascular risk reduction: o Current hypertensive regimen: losartan 100 mg daily, HCTZ 12.5 mg daily;  o Current hyperlipidemia regimen: OTC omega 3 fatty acids; 10 year ASCVD risk 18.1%; last LDL 166, last TG 275; resistant to statin therapy, as she wants to see the impact of lifestyle changes on her lab work first . Allergies: patient reports she is significantly troubled by allergies during this time of year. Currently taking cetirizine QPM + Flonase QAM, though notes that these medications dry out her nose. Uses saline nasal spray to rehydrate. She is thinking about switching to levocetirizine to see if this offers any improvement. Endorses post-nasal drip as most bothersome symptom.   Pharmacist Clinical Goal(s):  [name] Over the next 90 days, patient with work with PharmD and primary care provider  to address optimized diabetes management  Interventions: . Comprehensive medication review performed, medication list updated in electronic medical record . Reviewed goal A1c, goal fasting glucose, and goal 2 hour post prandial glucose . Discussed that given current regimen, BID testing is appropriate. Unsure if Medicare will pay for >2 times per day testing. Encouraged her to discuss with pharmacy the next time she fills.  . Appt w/ PCP next month. Will be due for A1c, also updated lipid panel. Discussed recommendation for statin therapy in DM, especially if 10 year ASCVD risk remains >7.5%. Encouraged to discuss w/ Dr. [name] next month.  . Discussed current allergy issues on daily PO antihistamine + daily nasal steroid. Could consider addition of azelastine nasal spray, though noted that it could cause more dryness. Will collaborate w/ Dr. [name] for his recommendations.   Patient Self Care Activities:  . Patient will check blood glucose BID , document, and provide at future appointments . Patient will take medications as prescribed . Patient will report any questions or concerns to provider   Please see past updates related to this goal by clicking on the "Past Updates" button in the selected goal         Patient verbalizes understanding of instructions provided today.   Plan:  - Scheduled f/u call 09/15/19  Catie Darnelle Maffucci, PharmD, BCACP, CPP Clinical Pharmacist [place] [phone]

## 2019-07-10 NOTE — Chronic Care Management (AMB) (Signed)
Chronic Care Management   Follow Up Note   07/10/2019 Name: Whitney Knight MRN: [id number] DOB: 1950-05-24  Referred by: Leone Haven, MD Reason for referral : Chronic Care Management (Medication Management)   Whitney ENNEN is a 69 y.o. year old female who is a primary care patient of [name], [name], MD. The CCM team was consulted for assistance with chronic disease management and care coordination needs.    Contacted patient for medication management review.   Review of patient status, including review of consultants reports, relevant laboratory and other test results, and collaboration with appropriate care team members and the patient's provider was performed as part of comprehensive patient evaluation and provision of chronic care management services.    SDOH (Social Determinants of Health) assessments performed: No See Care Plan activities for detailed interventions related to [facility])     Outpatient Encounter Medications as of 07/10/2019  Medication Sig Note  . fluticasone (FLONASE) 50 MCG/ACT nasal spray Place 2 sprays into both nostrils daily.   [name] glimepiride (AMARYL) 2 MG tablet Take 1 tablet (2 mg total) by mouth daily with breakfast. (Patient taking differently: Take 2 mg by mouth at bedtime as needed. )   . glucose blood (ONE TOUCH ULTRA TEST) test strip CHECK BLOOD SUGAR up to four times daily. Dx code E11.9, Brand: One Rolan Lipa Plan # [id number]   . hydrochlorothiazide (MICROZIDE) 12.5 MG capsule Take 1 capsule (12.5 mg total) by mouth daily.   [name] liraglutide (VICTOZA) 18 MG/3ML SOPN INJECT SUBCUTANEOUSLY 1.8MG  DAILY   . losartan (COZAAR) 100 MG tablet TAKE ONE TABLET BY MOUTH EVERY DAY   . metFORMIN (GLUCOPHAGE) 1000 MG tablet TAKE ONE TABLET TWICE DAILY WITH A MEAL   . Betamethasone Valerate 0.12 % foam Apply to affected area once daily for up to 7 days (Patient not taking: Reported on 04/14/2019)   . calcium carbonate (TUMS EX) 750 MG chewable  tablet Chew by mouth as needed.    [name] Sodium 30-100 MG CAPS Take 1 tablet by mouth daily. 12/19/2018: Occasionally every night  . cetirizine (ZYRTEC) 10 MG tablet Take 10 mg by mouth daily.   . cholecalciferol (VITAMIN D) 1000 UNITS tablet Take 1,000 Units by mouth daily.    . Emollient (COLLAGEN EX) liquid   . famotidine (PEPCID) 20 MG tablet Take 1/2 tablet by mouth daily at bedtime. MUST HAVE OFFICE VISIT FOR FURTHER REFILLS   . FEROSUL 325 (65 Fe) MG tablet TAKE ONE TABLET EACH MORNING WITH BREAKFAST   . Insulin Pen Needle (BD PEN NEEDLE NANO U/F) 32G X 4 MM MISC Inject daily as directed   . levothyroxine (SYNTHROID) 75 MCG tablet TAKE ONE TABLET EVERY DAY   . Multiple Vitamins-Minerals (WOMENS MULTIVITAMIN PO) Take by mouth.   . Omega-3 Fatty Acids (FISH OIL) 500 MG CAPS Take 500 tablets by mouth.   [name] omeprazole (PRILOSEC) 40 MG capsule Take 1 capsule (40 mg total) by mouth daily. MUST HAVE OFFICE VISIT FOR FURTHER REFILLS   . OneTouch Delica Lancets 99991111 MISC USE 2 TIMES DAILY AS  DIRECTED   . oxybutynin (DITROPAN-XL) 5 MG 24 hr tablet TAKE ONE TABLET EVERY DAY   . POTASSIUM CHLORIDE PO Take 99 mg by mouth. Taking OTC, unsure mg    . Probiotic Product (PROBIOTIC DAILY PO) Take by mouth.   . Zoledronic Acid (RECLAST IV) Inject into the vein. Once a year    No facility-administered encounter medications on file as of 07/10/2019.  Objective:   Goals Addressed            This Visit's Progress     Patient Stated   . "I want to work on my blood sugars" (pt-stated)       [place] (see longtitudinal plan of care for additional care plan information)  Current Barriers:  . Diabetes; uncontrolled; last A1c 7.5% o Discusses recent concerns with filling test strips to test QID. Insurance plan told her Medicare would only page for TID testing. However, pharmacy last filled to test BID.  [name] Current antihyperglycemic regimen: metformin 1000 mg BID, Victoza 1.8  mg once daily; glimepiride ~1 mg PRN higher sugars at bedtime  o Intolerance: Jardiance, nausea/vomiting . Dietary choices:  o Using One Bar as meal replacement PRN . Current glucose readings:  o Does note some episodes of hypoglycemia in the evenings if she doesn't eat well during the day, even if she hasn't taken glimepiride o Fastings: 98-130s  o 2 hour post prandial: most are in range <180, notes occasional issues w/ higher readings, will take glimepiride to correct . Current exercise: Walking on the treadmill; goal is 15-20 minutes, 3-4 times weekly  . Cardiovascular risk reduction: o Current hypertensive regimen: losartan 100 mg daily, HCTZ 12.5 mg daily;  o Current hyperlipidemia regimen: OTC omega 3 fatty acids; 10 year ASCVD risk 18.1%; last LDL 166, last TG 275; resistant to statin therapy, as she wants to see the impact of lifestyle changes on her lab work first . Allergies: patient reports she is significantly troubled by allergies during this time of year. Currently taking cetirizine QPM + Flonase QAM, though notes that these medications dry out her nose. Uses saline nasal spray to rehydrate. She is thinking about switching to levocetirizine to see if this offers any improvement. Endorses post-nasal drip as most bothersome symptom.   Pharmacist Clinical Goal(s):  [name] Over the next 90 days, patient with work with PharmD and primary care provider to address optimized diabetes management  Interventions: . Comprehensive medication review performed, medication list updated in electronic medical record . Reviewed goal A1c, goal fasting glucose, and goal 2 hour post prandial glucose . Discussed that given current regimen, BID testing is appropriate. Unsure if Medicare will pay for >2 times per day testing. Encouraged her to discuss with pharmacy the next time she fills.  . Appt w/ PCP next month. Will be due for A1c, also updated lipid panel. Discussed recommendation for statin therapy in  DM, especially if 10 year ASCVD risk remains >7.5%. Encouraged to discuss w/ Dr. [name] next month.  . Discussed current allergy issues on daily PO antihistamine + daily nasal steroid. Could consider addition of azelastine nasal spray, though noted that it could cause more dryness. Will collaborate w/ Dr. [name] for his recommendations.   Patient Self Care Activities:  . Patient will check blood glucose BID , document, and provide at future appointments . Patient will take medications as prescribed . Patient will report any questions or concerns to provider   Please see past updates related to this goal by clicking on the "Past Updates" button in the selected goal          Plan:  - Scheduled f/u call 09/15/19  Catie Darnelle Maffucci, PharmD, BCACP, [place] Pharmacist [facility] [phone]

## 2019-07-11 ENCOUNTER — Ambulatory Visit: Payer: Self-pay | Admitting: Pharmacist

## 2019-07-11 NOTE — Chronic Care Management (AMB) (Signed)
  Chronic Care Management   Note  07/11/2019 Name: Whitney Knight MRN: [id number] DOB: 06/25/1950  Whitney Knight is a 69 y.o. year old female who is a primary care patient of [name], [name], MD. The CCM team was consulted for assistance with chronic disease management and care coordination needs.    Contacted patient to discuss possibility of adding montelukast as suggested by PCP. She noted that she purchased levocetirizine and tried yesterday, and did not have any post nasal drip this morning. She will try this nightly until our next follow up call where we will reassess.   Catie Darnelle Maffucci, PharmD, [place], CPP Clinical Pharmacist [place] [phone]

## 2019-07-16 ENCOUNTER — Other Ambulatory Visit: Payer: Self-pay | Admitting: Family Medicine

## 2019-07-27 ENCOUNTER — Encounter: Payer: Self-pay | Admitting: Family Medicine

## 2019-07-28 ENCOUNTER — Ambulatory Visit (INDEPENDENT_AMBULATORY_CARE_PROVIDER_SITE_OTHER): Payer: Medicare Other

## 2019-07-28 ENCOUNTER — Other Ambulatory Visit: Payer: Self-pay

## 2019-07-28 VITALS — Ht 63.5 in | Wt 167.0 lb

## 2019-07-28 DIAGNOSIS — Z0000 Encounter for general adult medical examination without abnormal findings: Secondary | ICD-10-CM | POA: Diagnosis not present

## 2019-07-28 NOTE — Patient Instructions (Addendum)
[name] , Thank you for taking time to come for your Medicare Wellness Visit. I appreciate your ongoing commitment to your health goals. Please review the following plan we discussed and let me know if I can assist you in the future.   These are the goals we discussed: Goals      Patient Stated   . "I want to work on my blood sugars" (pt-stated)     [place] (see longtitudinal plan of care for additional care plan information)  Current Barriers:  . Diabetes; uncontrolled; last A1c 7.5% o Discusses recent concerns with filling test strips to test QID. Insurance plan told her Medicare would only page for TID testing. However, pharmacy last filled to test BID.  [name] Current antihyperglycemic regimen: metformin 1000 mg BID, Victoza 1.8 mg once daily; glimepiride ~1 mg PRN higher sugars at bedtime  o Intolerance: Jardiance, nausea/vomiting . Dietary choices:  o Using One Bar as meal replacement PRN . Current glucose readings:  o Does note some episodes of hypoglycemia in the evenings if she doesn't eat well during the day, even if she hasn't taken glimepiride o Fastings: 98-130s  o 2 hour post prandial: most are in range <180, notes occasional issues w/ higher readings, will take glimepiride to correct . Current exercise: Walking on the treadmill; goal is 15-20 minutes, 3-4 times weekly  . Cardiovascular risk reduction: o Current hypertensive regimen: losartan 100 mg daily, HCTZ 12.5 mg daily;  o Current hyperlipidemia regimen: OTC omega 3 fatty acids; 10 year ASCVD risk 18.1%; last LDL 166, last TG 275; resistant to statin therapy, as she wants to see the impact of lifestyle changes on her lab work first . Allergies: patient reports she is significantly troubled by allergies during this time of year. Currently taking cetirizine QPM + Flonase QAM, though notes that these medications dry out her nose. Uses saline nasal spray to rehydrate. She is thinking about switching to  levocetirizine to see if this offers any improvement. Endorses post-nasal drip as most bothersome symptom.   Pharmacist Clinical Goal(s):  [name] Over the next 90 days, patient with work with PharmD and primary care provider to address optimized diabetes management  Interventions: . Comprehensive medication review performed, medication list updated in electronic medical record . Reviewed goal A1c, goal fasting glucose, and goal 2 hour post prandial glucose . Discussed that given current regimen, BID testing is appropriate. Unsure if Medicare will pay for >2 times per day testing. Encouraged her to discuss with pharmacy the next time she fills.  . Appt w/ PCP next month. Will be due for A1c, also updated lipid panel. Discussed recommendation for statin therapy in DM, especially if 10 year ASCVD risk remains >7.5%. Encouraged to discuss w/ Dr. [name] next month.  . Discussed current allergy issues on daily PO antihistamine + daily nasal steroid. Could consider addition of azelastine nasal spray, though noted that it could cause more dryness. Will collaborate w/ Dr. [name] for his recommendations.   Patient Self Care Activities:  . Patient will check blood glucose BID , document, and provide at future appointments . Patient will take medications as prescribed . Patient will report any questions or concerns to provider   Please see past updates related to this goal by clicking on the "Past Updates" button in the selected goal      . I want to walk more for exercise by using the treadmill (pt-stated)    . I'd like to drink more water (pt-stated)  Notes adding IV health water (in stores) to encourage more water intake.        This is a list of the screening recommended for you and due dates:  Health Maintenance  Topic Date Due  . Tetanus Vaccine  01/02/2017  . Pneumonia vaccines (1 of 2 - PCV13) 11/25/2017  . Complete foot exam   08/24/2018  . Mammogram  07/26/2019  . Hemoglobin A1C   09/10/2019  . Eye exam for diabetics  10/24/2019  . Colon Cancer Screening  01/11/2023  . Flu Shot  Completed  . DEXA scan (bone density measurement)  Completed  .  Hepatitis C: One time screening is recommended by Center for Disease Control  (CDC) for  adults born from [birth year] through 1963.   Completed

## 2019-07-28 NOTE — Progress Notes (Signed)
Subjective:   AIREN GOLDRICK is a 69 y.o. female who presents for an Initial Medicare Annual Wellness Visit.  Review of Systems    No ROS.  Medicare Wellness Virtual Visit.  Visual/audio telehealth visit, UTA vital signs.   Ht/Wt provided.  See social history for additional risk factors.    Cardiac Risk Factors include: advanced age (>65men, >28 women);hypertension;diabetes mellitus     Objective:    Today's Vitals   07/28/19 1036  Weight: 167 lb (75.8 kg)  Height: 5' 3.5" (1.613 m)   Body mass index is 29.12 kg/m.  Advanced Directives 07/28/2019 05/15/2017 05/18/2015  Does Patient Have a Medical Advance Directive? Yes No Yes  Type of Paramedic of [place];Living will - [place];Living will  Does patient want to make changes to medical advance directive? No - Patient declined - -  Copy of [place] in Chart? No - copy requested - -  Would patient like information on creating a medical advance directive? - No - Patient declined -    Current Medications (verified) Outpatient Encounter Medications as of 07/28/2019  Medication Sig  . calcium carbonate (TUMS EX) 750 MG chewable tablet Chew by mouth as needed.   . cholecalciferol (VITAMIN D) 1000 UNITS tablet Take 1,000 Units by mouth daily.   . Emollient (COLLAGEN EX) liquid  . famotidine (PEPCID) 20 MG tablet Take 1/2 tablet by mouth daily at bedtime. MUST HAVE OFFICE VISIT FOR FURTHER REFILLS  . FEROSUL 325 (65 Fe) MG tablet TAKE ONE TABLET EACH MORNING WITH BREAKFAST  . fluticasone (FLONASE) 50 MCG/ACT nasal spray Place 2 sprays into both nostrils daily.  [name] glimepiride (AMARYL) 2 MG tablet TAKE ONE TABLET EVERY DAY WITH BREAKFAST  . hydrochlorothiazide (MICROZIDE) 12.5 MG capsule Take 1 capsule (12.5 mg total) by mouth daily.  . Insulin Pen Needle (BD PEN NEEDLE NANO U/F) 32G X 4 MM MISC Inject daily as directed  . levocetirizine (XYZAL) 5 MG tablet Take 5 mg  by mouth every evening.  [name] levothyroxine (SYNTHROID) 75 MCG tablet TAKE ONE TABLET EVERY DAY  . liraglutide (VICTOZA) 18 MG/3ML SOPN INJECT SUBCUTANEOUSLY 1.8MG  DAILY  . losartan (COZAAR) 100 MG tablet TAKE ONE TABLET BY MOUTH EVERY DAY  . metFORMIN (GLUCOPHAGE) 1000 MG tablet TAKE ONE TABLET TWICE DAILY WITH A MEAL  . Multiple Vitamins-Minerals (WOMENS MULTIVITAMIN PO) Take by mouth.  . Omega-3 Fatty Acids (FISH OIL) 500 MG CAPS Take 500 tablets by mouth.  [name] [id number] MISC USE 2 TIMES DAILY AS  DIRECTED  . oxybutynin (DITROPAN-XL) 5 MG 24 hr tablet TAKE ONE TABLET EVERY DAY  . POTASSIUM CHLORIDE PO Take 99 mg by mouth. Taking OTC, unsure mg   . Probiotic Product (PROBIOTIC DAILY PO) Take by mouth.  . Zoledronic Acid (RECLAST IV) Inject into the vein. Once a year  . Betamethasone Valerate 0.12 % foam Apply to affected area once daily for up to 7 days (Patient not taking: Reported on 04/14/2019)  . Casanthranol-Docusate Sodium 30-100 MG CAPS Take 1 tablet by mouth daily.  [name] glucose blood (ONE TOUCH ULTRA TEST) test strip CHECK BLOOD SUGAR up to four times daily. Dx code E11.9, Brand: One Rolan Lipa Plan # [id number]  . omeprazole (PRILOSEC) 40 MG capsule Take 1 capsule (40 mg total) by mouth daily. MUST HAVE OFFICE VISIT FOR FURTHER REFILLS  . [DISCONTINUED] cetirizine (ZYRTEC) 10 MG tablet Take 10 mg by mouth daily.   No  facility-administered encounter medications on file as of 07/28/2019.    Allergies (verified) Clarithromycin, Jardiance [empagliflozin], and Penicillins   History: Past Medical History:  Diagnosis Date  . Allergy   . Anemia   . Arthritis    KNEES  . Cataract   . Chronic sinusitis   . Cough   . Diabetes mellitus   . Diverticulosis   . GERD (gastroesophageal reflux disease)   . Headache(784.0)    [facility] in past  . Hypertension   . Osteoporosis   . Thyroid disease    Past Surgical History:  Procedure Laterality Date    . bone graft     for dental surgery  . CARPAL TUNNEL RELEASE Right 02/27/2013   [facility]  . COLONOSCOPY  2014  . dental implants    . DENTAL SURGERY    . NASAL SINUS SURGERY  06/2008  . TRIGGER FINGER RELEASE Right 02/27/2013   [facility]  . UPPER GASTROINTESTINAL ENDOSCOPY    . VAGINAL DELIVERY     2   Family History  Problem Relation Age of Onset  . COPD Father   . Hypertension Father   . Heart disease Father   . Heart disease Mother   . Diabetes Maternal Grandmother   . Colon cancer Neg Hx   . Esophageal cancer Neg Hx   . Pancreatic cancer Neg Hx   . Stomach cancer Neg Hx   . Liver disease Neg Hx    Social History   Socioeconomic History  . Marital status: Married    Spouse name: Not on file  . Number of children: 2  . Years of education: Not on file  . Highest education level: Not on file  Occupational History  . Occupation: [employer]    Employer: [employer]  Tobacco Use  . Smoking status: Never Smoker  . Smokeless tobacco: Never Used  Substance and Sexual Activity  . Alcohol use: Yes    Comment: occ  . Drug use: No  . Sexual activity: Not on file  Other Topics Concern  . Not on file  Social History Narrative   Lives with husband. No pets, 2 children.      Work - [employer]      Diet - regular   Exercise - none presently   Social Determinants of Radio broadcast assistant Strain:   . Difficulty of Paying Living Expenses:   Food Insecurity:   . Worried About Charity fundraiser in the Last Year:   . Arboriculturist in the Last Year:   Transportation Needs:   . Film/video editor (Medical):   [name] Lack of Transportation (Non-Medical):   Physical Activity:   . Days of Exercise per Week:   . Minutes of Exercise per Session:   Stress:   . Feeling of Stress :   Social Connections:   . Frequency of Communication with Friends and Family:   . Frequency of Social Gatherings with Friends and Family:   . Attends Religious Services:   . Active Member  of Clubs or Organizations:   . Attends Archivist Meetings:   [name] Marital Status:     Tobacco Counseling Counseling given: Not Answered   Clinical Intake:  Pre-visit preparation completed: Yes        Diabetes: Yes(Followed by pcp)  How often do you need to have someone help you when you read instructions, pamphlets, or other written materials from your doctor or pharmacy?: 1 - Never  Interpreter  Needed?: No      Activities of Daily Living In your present state of health, do you have any difficulty performing the following activities: 07/28/2019  Hearing? Y  Comment Hearing aid  Vision? N  Difficulty concentrating or making decisions? N  Walking or climbing stairs? Y  Dressing or bathing? N  Doing errands, shopping? N  Preparing Food and eating ? N  Using the Toilet? N  In the past six months, have you accidently leaked urine? N  Do you have problems with loss of bowel control? N  Managing your Medications? N  Managing your Finances? N  Housekeeping or managing your Housekeeping? N  Some recent data might be hidden     Immunizations and Health Maintenance Immunization History  Administered Date(s) Administered  . Fluad Quad(high Dose 65+) 03/13/2019  . Influenza Split 03/01/2014  . Influenza,inj,Quad PF,6+ Mos 01/31/2013, 01/25/2015, 01/25/2018  . Influenza-Unspecified 04/22/2012  . Pneumococcal Polysaccharide-23 10/31/2012  . Td 01/03/2007   Health Maintenance Due  Topic Date Due  . TETANUS/TDAP  01/02/2017  . PNA vac Low Risk Adult (1 of 2 - PCV13) 11/25/2017  . FOOT EXAM  08/24/2018  . MAMMOGRAM  07/26/2019    Patient Care Team: Leone Haven, MD as PCP - General (Family Medicine) De Hollingshead, [facility] as Pharmacist (Pharmacist)  Indicate any recent Medical Services you may have received from other than [facility] providers in the past year (date may be approximate).     Assessment:   This is a routine wellness examination for  [place].  Nurse connected with patient 07/28/19 at 10:30 AM EDT by a telephone enabled telemedicine application and verified that I am speaking with the correct person using two identifiers. Patient stated full name and DOB. Patient gave permission to continue with virtual visit. Patient's location was at home and Nurse's location was at [facility] office.   Patient is alert and oriented x3. Patient denies difficulty focusing or concentrating. 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   Health Maintenance Due: -PNA, Shingles and Tdap vaccine- discussed; to be completed with doctor in visit or local pharmacy.  -Mammogram- scheduled with [facility] in [place]; record to be sent to pcp upon completion.  -Foot Exam- followed by pcp. Notes no changes.  -Hgb A1c- 03/13/19 (7.5) See completed HM at the end of note.   Eye: Visual acuity not assessed. Virtual visit. Followed by their ophthalmologist. Retinopathy- none reported.  Dental: Dentures Implant  Hearing: Demonstrates normal hearing during visit. Hearing aids- yes  Safety:  Patient feels safe at home- yes Patient does have smoke detectors at home- yes Patient does wear sunscreen or protective clothing when in direct sunlight - yes Patient does wear seat belt when in a moving vehicle - yes Patient drives- yes Adequate lighting in walkways free from debris- yes Grab bars and handrails used as appropriate- yes Ambulates with an assistive device- yes   Social: Alcohol intake - yes      Smoking history- never   Smokers in home? none Illicit drug use? none  Medication: Taking as directed and without issues.  Pill box in use -yes  Self managed - yes   Covid-19: Precautions and sickness symptoms discussed. Wears mask, social distancing, hand hygiene as appropriate.   Activities of Daily Living Patient denies needing assistance with: household chores, feeding themselves, getting from  bed to chair, getting to the toilet, bathing/showering, dressing, managing money, or preparing meals.   Discussed the  importance of a healthy diet, water intake and the benefits of aerobic exercise.   Physical activity- walking  Diet:  Modified Water: fair intake  Other Providers Patient Care Team: Leone Haven, MD as PCP - General (Family Medicine) De Hollingshead, [facility] as Pharmacist (Pharmacist) Hearing/Vision screen  Hearing Screening   125Hz  250Hz  500Hz  1000Hz  2000Hz  3000Hz  4000Hz  6000Hz  8000Hz   Right ear:           Left ear:           Comments: Hearing aids  Vision Screening Comments: Visual acuity not assessed, virtual visit.  They have seen their ophthalmologist in the last 12 months.     Dietary issues and exercise activities discussed: Current Exercise Habits: Home exercise routine, Type of exercise: walking, Intensity: Mild  Goals      Patient Stated   . "I want to work on my blood sugars" (pt-stated)     [place] (see longtitudinal plan of care for additional care plan information)  Current Barriers:  . Diabetes; uncontrolled; last A1c 7.5% o Discusses recent concerns with filling test strips to test QID. Insurance plan told her Medicare would only page for TID testing. However, pharmacy last filled to test BID.  [name] Current antihyperglycemic regimen: metformin 1000 mg BID, Victoza 1.8 mg once daily; glimepiride ~1 mg PRN higher sugars at bedtime  o Intolerance: Jardiance, nausea/vomiting . Dietary choices:  o Using One Bar as meal replacement PRN . Current glucose readings:  o Does note some episodes of hypoglycemia in the evenings if she doesn't eat well during the day, even if she hasn't taken glimepiride o Fastings: 98-130s  o 2 hour post prandial: most are in range <180, notes occasional issues w/ higher readings, will take glimepiride to correct . Current exercise: Walking on the treadmill; goal is 15-20 minutes, 3-4 times weekly   . Cardiovascular risk reduction: o Current hypertensive regimen: losartan 100 mg daily, HCTZ 12.5 mg daily;  o Current hyperlipidemia regimen: OTC omega 3 fatty acids; 10 year ASCVD risk 18.1%; last LDL 166, last TG 275; resistant to statin therapy, as she wants to see the impact of lifestyle changes on her lab work first . Allergies: patient reports she is significantly troubled by allergies during this time of year. Currently taking cetirizine QPM + Flonase QAM, though notes that these medications dry out her nose. Uses saline nasal spray to rehydrate. She is thinking about switching to levocetirizine to see if this offers any improvement. Endorses post-nasal drip as most bothersome symptom.   Pharmacist Clinical Goal(s):  [name] Over the next 90 days, patient with work with PharmD and primary care provider to address optimized diabetes management  Interventions: . Comprehensive medication review performed, medication list updated in electronic medical record . Reviewed goal A1c, goal fasting glucose, and goal 2 hour post prandial glucose . Discussed that given current regimen, BID testing is appropriate. Unsure if Medicare will pay for >2 times per day testing. Encouraged her to discuss with pharmacy the next time she fills.  . Appt w/ PCP next month. Will be due for A1c, also updated lipid panel. Discussed recommendation for statin therapy in DM, especially if 10 year ASCVD risk remains >7.5%. Encouraged to discuss w/ Dr. [name] next month.  . Discussed current allergy issues on daily PO antihistamine + daily nasal steroid. Could consider addition of azelastine nasal spray, though noted that it could cause more dryness. Will collaborate w/ Dr. [name] for his recommendations.   Patient  Self Care Activities:  . Patient will check blood glucose BID , document, and provide at future appointments . Patient will take medications as prescribed . Patient will report any questions or concerns to  provider   Please see past updates related to this goal by clicking on the "Past Updates" button in the selected goal      . I want to walk more for exercise by using the treadmill (pt-stated)    . I'd like to drink more water (pt-stated)     Notes adding IV health water (in stores) to encourage more water intake.       Depression Screen PHQ 2/9 Scores 07/28/2019 02/14/2019  PHQ - 2 Score 0 0    Fall Risk Fall Risk  07/28/2019 02/14/2019  Falls in the past year? 0 0  Number falls in past yr: - 0  Follow up Falls evaluation completed Falls evaluation completed    Timed Get Up and Go Performed no, virtual visit  Cognitive Function:     6CIT Screen 07/28/2019  What Year? 0 points  What month? 0 points  What time? 0 points    Screening Tests Health Maintenance  Topic Date Due  . TETANUS/TDAP  01/02/2017  . PNA vac Low Risk Adult (1 of 2 - PCV13) 11/25/2017  . FOOT EXAM  08/24/2018  . MAMMOGRAM  07/26/2019  . HEMOGLOBIN A1C  09/10/2019  . OPHTHALMOLOGY EXAM  10/24/2019  . COLONOSCOPY  01/11/2023  . INFLUENZA VACCINE  Completed  . DEXA SCAN  Completed  . Hepatitis C Screening  Completed     Plan:   Keep all routine maintenance appointments.   Follow up 08/18/19 @ 8:30   Follow up 09/15/19 @ 2:00 [facility]CM   Reports the need for one touch ultra strips TID be sent to [facility]. Medicare will not pay for 4 times daily. Last received on 2/27 as BID and paid out of pocket for the additional amount needed. Deferred to [facility] pharmacist for follow up as she has been working with patient to resolve this matter. Notes to follow up with pcp as needed.   Medicare Attestation I have personally reviewed: The patient's medical and social history Their use of alcohol, tobacco or illicit drugs Their current medications and supplements The patient's functional ability including ADLs,fall risks, home safety risks, cognitive, and hearing and visual impairment Diet and physical  activities Evidence for depression   I have reviewed and discussed with patient certain preventive protocols, quality metrics, and best practice recommendations.     Varney Biles, LPN   [id number]

## 2019-07-28 NOTE — Progress Notes (Signed)
I have reviewed the above note and agree.  Furqan Gosselin, M.D.  

## 2019-08-01 ENCOUNTER — Other Ambulatory Visit: Payer: Self-pay | Admitting: Family Medicine

## 2019-08-02 ENCOUNTER — Other Ambulatory Visit: Payer: Self-pay | Admitting: Family Medicine

## 2019-08-02 MED ORDER — HYDROCHLOROTHIAZIDE 12.5 MG PO CAPS: 12.5000 mg | ORAL_CAPSULE | Freq: Every day | ORAL | 0 refills | Status: DC

## 2019-08-13 ENCOUNTER — Encounter: Payer: Self-pay | Admitting: Family Medicine

## 2019-08-14 ENCOUNTER — Encounter: Payer: Self-pay | Admitting: Family Medicine

## 2019-08-18 ENCOUNTER — Other Ambulatory Visit: Payer: Self-pay

## 2019-08-18 ENCOUNTER — Telehealth (INDEPENDENT_AMBULATORY_CARE_PROVIDER_SITE_OTHER): Payer: Medicare Other | Admitting: Family Medicine

## 2019-08-18 VITALS — BP 127/68 | Ht 63.5 in | Wt 167.5 lb

## 2019-08-18 DIAGNOSIS — Z98811 Dental restoration status: Secondary | ICD-10-CM | POA: Insufficient documentation

## 2019-08-18 DIAGNOSIS — E1165 Type 2 diabetes mellitus with hyperglycemia: Secondary | ICD-10-CM

## 2019-08-18 DIAGNOSIS — E119 Type 2 diabetes mellitus without complications: Secondary | ICD-10-CM

## 2019-08-18 DIAGNOSIS — E782 Mixed hyperlipidemia: Secondary | ICD-10-CM | POA: Diagnosis not present

## 2019-08-18 DIAGNOSIS — D509 Iron deficiency anemia, unspecified: Secondary | ICD-10-CM

## 2019-08-18 DIAGNOSIS — I10 Essential (primary) hypertension: Secondary | ICD-10-CM

## 2019-08-18 DIAGNOSIS — E785 Hyperlipidemia, unspecified: Secondary | ICD-10-CM | POA: Insufficient documentation

## 2019-08-18 DIAGNOSIS — J309 Allergic rhinitis, unspecified: Secondary | ICD-10-CM | POA: Diagnosis not present

## 2019-08-18 MED ORDER — TETANUS-DIPHTHERIA TOXOIDS TD 5-2 LFU IM INJ: 0.5000 mL | INJECTION | Freq: Once | INTRAMUSCULAR | 0 refills | Status: AC

## 2019-08-18 MED ORDER — GLUCOSE BLOOD VI STRP: ORAL_STRIP | 3 refills | Status: DC

## 2019-08-18 NOTE — Assessment & Plan Note (Signed)
Adequately controlled.  Continue current regimen.  She will come in for labs.

## 2019-08-18 NOTE — Assessment & Plan Note (Signed)
Improved.  Continue Flonase and Xyzal.

## 2019-08-18 NOTE — Assessment & Plan Note (Signed)
Check lipid panel  

## 2019-08-18 NOTE — Assessment & Plan Note (Signed)
She is going to have this replaced and have a bone graft.  She is aware of the risk of osteonecrosis of the jaw and reports she signed a waiver for the oral surgeon.

## 2019-08-18 NOTE — Progress Notes (Signed)
Virtual Visit via video Note  This visit type was conducted due to national recommendations for restrictions regarding the COVID-19 pandemic (e.g. social distancing).  This format is felt to be most appropriate for this patient at this time.  All issues noted in this document were discussed and addressed.  No physical exam was performed (except for noted visual exam findings with Video Visits).   I connected with Whitney Knight today at  8:30 AM EDT by a video enabled telemedicine application and verified that I am speaking with the correct person using two identifiers. Location patient: home Location provider: work  Persons participating in the virtual visit: patient, provider  I discussed the limitations, risks, security and privacy concerns of performing an evaluation and management service by telephone and the availability of in person appointments. I also discussed with the patient that there may be a patient responsible charge related to this service. The patient expressed understanding and agreed to proceed.  Reason for visit: follow-up  HPI: Allergic rhinitis: Continues on Flonase.  Switch to Xyzal which has been beneficial.  Rhinorrhea has improved.  She uses a sinus rinse occasionally.  Diabetes: Typically 110-125 fasting.  Can be up to 180 prior to dinner.  Taking Metformin and glimepiride.  Victoza is too expensive now that she is in the donut hole.  She has several pens left.  No polyuria or polydipsia.  She was having some hypoglycemia when she was trying to limit her food intake though she has adjusted that and has not had as many issues.  She would typically eat something and symptoms would resolve.  Hypertension: Last check was 127/68.  Taking HCTZ and losartan.  No chest pain, shortness breath, or edema.  Dental issue: She is going to have surgery with removal of a tooth and crown and then a bone graft.  She is on Reclast.  She notes the dentist is aware of this and she had  to sign a waiver stating that she is aware that she could have osteonecrosis of the jaw.  Iron deficiency anemia: She continues on ferrous sulfate.  She declines COVID-19 vaccine.  Discussed considering this.  She will get her tetanus vaccine at the pharmacy.  Discussed pneumonia vaccine though she is unsure of this.  Reports last mammogram was last July or Whitney.  Through her gynecologist.   ROS: See pertinent positives and negatives per HPI.  Past Medical History:  Diagnosis Date  . Allergy   . Anemia   . Arthritis    KNEES  . Cataract   . Chronic sinusitis   . Cough   . Diabetes mellitus   . Diverticulosis   . GERD (gastroesophageal reflux disease)   . Headache(784.0)    [facility] in past  . Hypertension   . Osteoporosis   . Thyroid disease     Past Surgical History:  Procedure Laterality Date  . bone graft     for dental surgery  . CARPAL TUNNEL RELEASE Right 02/27/2013   [facility]  . COLONOSCOPY  2014  . dental implants    . DENTAL SURGERY    . NASAL SINUS SURGERY  06/2008  . TRIGGER FINGER RELEASE Right 02/27/2013   [facility]  . UPPER GASTROINTESTINAL ENDOSCOPY    . VAGINAL DELIVERY     2    Family History  Problem Relation Age of Onset  . COPD Father   . Hypertension Father   . Heart disease Father   . Heart disease Mother   .  Diabetes Maternal Grandmother   . Colon cancer Neg Hx   . Esophageal cancer Neg Hx   . Pancreatic cancer Neg Hx   . Stomach cancer Neg Hx   . Liver disease Neg Hx     SOCIAL HX: Non-smoker   Current Outpatient Medications:  .  Betamethasone Valerate 0.12 % foam, Apply to affected area once daily for up to 7 days, Disp: 100 g, Rfl: 0 .  calcium carbonate (TUMS EX) 750 MG chewable tablet, Chew by mouth as needed. , Disp: , Rfl:  .  Casanthranol-Docusate Sodium 30-100 MG CAPS, Take 1 tablet by mouth daily., Disp: 90 each, Rfl: 3 .  cholecalciferol (VITAMIN D) 1000 UNITS tablet, Take 1,000 Units by mouth daily. , Disp: ,  Rfl:  .  Emollient (COLLAGEN EX), liquid, Disp: , Rfl:  .  famotidine (PEPCID) 20 MG tablet, Take 1/2 tablet by mouth daily at bedtime. MUST HAVE OFFICE VISIT FOR FURTHER REFILLS, Disp: 45 tablet, Rfl: 0 .  FEROSUL 325 (65 Fe) MG tablet, TAKE ONE TABLET EACH MORNING WITH BREAKFAST, Disp: 30 tablet, Rfl: 1 .  fluticasone (FLONASE) 50 MCG/ACT nasal spray, Place 2 sprays into both nostrils daily., Disp: 48 g, Rfl: 1 .  glimepiride (AMARYL) 2 MG tablet, TAKE ONE TABLET EVERY DAY WITH BREAKFAST, Disp: 90 tablet, Rfl: 1 .  hydrochlorothiazide (MICROZIDE) 12.5 MG capsule, Take 1 capsule (12.5 mg total) by mouth daily., Disp: 90 capsule, Rfl: 0 .  Insulin Pen Needle (BD PEN NEEDLE NANO U/F) 32G X 4 MM MISC, Inject daily as directed, Disp: 100 each, Rfl: 5 .  levocetirizine (XYZAL) 5 MG tablet, Take 5 mg by mouth every evening., Disp: , Rfl:  .  levothyroxine (SYNTHROID) 75 MCG tablet, TAKE ONE TABLET EVERY DAY, Disp: 90 tablet, Rfl: 1 .  liraglutide (VICTOZA) 18 MG/3ML SOPN, INJECT SUBCUTANEOUSLY 1.8MG DAILY, Disp: 81 mL, Rfl: 3 .  losartan (COZAAR) 100 MG tablet, TAKE ONE TABLET BY MOUTH EVERY DAY, Disp: 90 tablet, Rfl: 1 .  metFORMIN (GLUCOPHAGE) 1000 MG tablet, TAKE ONE TABLET TWICE DAILY WITH A MEAL, Disp: 90 tablet, Rfl: 1 .  Multiple Vitamins-Minerals (WOMENS MULTIVITAMIN PO), Take by mouth., Disp: , Rfl:  .  Omega-3 Fatty Acids (FISH OIL) 500 MG CAPS, Take 500 tablets by mouth., Disp: , Rfl:  .  OneTouch Delica Lancets 37G MISC, USE 2 TIMES DAILY AS  DIRECTED, Disp: 300 each, Rfl: 1 .  oxybutynin (DITROPAN-XL) 5 MG 24 hr tablet, TAKE ONE TABLET EVERY DAY, Disp: 90 tablet, Rfl: 1 .  POTASSIUM CHLORIDE PO, Take 99 mg by mouth. Taking OTC, unsure mg , Disp: , Rfl:  .  Probiotic Product (PROBIOTIC DAILY PO), Take by mouth., Disp: , Rfl:  .  Zoledronic Acid (RECLAST IV), Inject into the vein. Once a year, Disp: , Rfl:  .  glucose blood (ONE TOUCH ULTRA TEST) test strip, CHECK BLOOD SUGAR up to three  times daily. Dx code E11.9, Brand: One touch Corey Skains Plan # [id number], Disp: 400 each, Rfl: 3 .  omeprazole (PRILOSEC) 40 MG capsule, Take 1 capsule (40 mg total) by mouth daily. MUST HAVE OFFICE VISIT FOR FURTHER REFILLS, Disp: 90 capsule, Rfl: 0 .  tetanus & diphtheria toxoids, adult, (TENIVAC) 5-2 LFU injection, Inject 0.5 mLs into the muscle once for 1 dose., Disp: 0.5 mL, Rfl: 0  EXAM:  VITALS per patient if applicable:  GENERAL: alert, oriented, appears well and in no acute distress  HEENT: atraumatic, conjunttiva clear, no obvious abnormalities  on inspection of external nose and ears  NECK: normal movements of the head and neck  LUNGS: on inspection no signs of respiratory distress, breathing rate appears normal, no obvious gross SOB, gasping or wheezing  CV: no obvious cyanosis  MS: moves all visible extremities without noticeable abnormality  PSYCH/NEURO: pleasant and cooperative, no obvious depression or anxiety, speech and thought processing grossly intact  ASSESSMENT AND PLAN:  Discussed the following assessment and plan:  Hypertension Adequately controlled.  Continue current regimen.  She will come in for labs.  Allergic rhinitis Improved.  Continue Flonase and Xyzal.  Diabetes mellitus type 2, uncontrolled Uncontrolled.  She will continue Metformin and glimepiride.  We will have her discuss her options for her Victoza with our clinical pharmacist.  Iron deficiency anemia Check labs.  Continue iron at this time.  Dental crown present She is going to have this replaced and have a bone graft.  She is aware of the risk of osteonecrosis of the jaw and reports she signed a waiver for the oral surgeon.  Hyperlipidemia Check lipid panel.   Orders Placed This Encounter  Procedures  . Comp Met (CMET)    Standing Status:   Future    Standing Expiration Date:   08/17/2020  . Lipid panel    Standing Status:   Future    Standing Expiration Date:   08/17/2020    . CBC    Standing Status:   Future    Standing Expiration Date:   08/17/2020  . Ferritin    Standing Status:   Future    Standing Expiration Date:   08/17/2020  . Iron Binding Cap (TIBC)([facility])    Standing Status:   Future    Standing Expiration Date:   08/17/2020  . Iron    Standing Status:   Future    Standing Expiration Date:   08/17/2020    Meds ordered this encounter  Medications  . tetanus & diphtheria toxoids, adult, (TENIVAC) 5-2 LFU injection    Sig: Inject 0.5 mLs into the muscle once for 1 dose.    Dispense:  0.5 mL    Refill:  0  . glucose blood (ONE TOUCH ULTRA TEST) test strip    Sig: CHECK BLOOD SUGAR up to three times daily. Dx code E11.9, BrandLorina Knight Plan # [id number]    Dispense:  400 each    Refill:  3     I discussed the assessment and treatment plan with the patient. The patient was provided an opportunity to ask questions and all were answered. The patient agreed with the plan and demonstrated an understanding of the instructions.   The patient was advised to call back or seek an in-person evaluation if the symptoms worsen or if the condition fails to improve as anticipated.    Tommi Rumps, MD

## 2019-08-18 NOTE — Assessment & Plan Note (Signed)
Uncontrolled.  She will continue Metformin and glimepiride.  We will have her discuss her options for her Victoza with our clinical pharmacist.

## 2019-08-18 NOTE — Assessment & Plan Note (Signed)
Check labs.  Continue iron at this time.

## 2019-08-21 ENCOUNTER — Encounter: Payer: Self-pay | Admitting: Pharmacist

## 2019-08-21 ENCOUNTER — Ambulatory Visit: Payer: Self-pay | Admitting: Pharmacist

## 2019-08-21 NOTE — Chronic Care Management (AMB) (Signed)
  Chronic Care Management   Note  08/21/2019 Name: Whitney Knight MRN: [id number] DOB: 03-09-1951  [name] is a 69 y.o. year old female who is a primary care patient of [name], [name], MD. The CCM team was consulted for assistance with chronic disease management and care coordination needs.    Attempted to contact patient for medication access needs. Left HIPAA compliant message for patient to return my call at her convenience. Sent MyChart message w/ income requirement to qualify for Victoza assistance.   Follow up plan: - Will await call back from patient  Catie Darnelle Maffucci, PharmD, [place], [place] Pharmacist [place] [phone]

## 2019-08-22 ENCOUNTER — Ambulatory Visit: Payer: Self-pay | Admitting: Pharmacist

## 2019-08-22 DIAGNOSIS — E1165 Type 2 diabetes mellitus with hyperglycemia: Secondary | ICD-10-CM

## 2019-08-22 NOTE — Patient Instructions (Signed)
Visit Information  Goals Addressed            This Visit's Progress     Patient Stated   . "I want to work on my blood sugars" (pt-stated)       [place] (see longtitudinal plan of care for additional care plan information)  Current Barriers:  . Diabetes; uncontrolled; last A1c 7.5% o Reported to Dr. [name] that she had hit the Medicare Coverage Gap and had stopped taking Victoza.  . Current antihyperglycemic regimen: metformin 1000 mg BID, Victoza 1.8 mg once daily (not taking right now) glimepiride ~1 mg PRN higher sugars at bedtime  o Intolerance: Jardiance, nausea/vomiting . Cardiovascular risk reduction: o Current hypertensive regimen: losartan 100 mg daily, HCTZ 12.5 mg daily;  o Current hyperlipidemia regimen: OTC omega 3 fatty acids; 10 year ASCVD risk 18.1%; last LDL 166, last TG 275; resistant to statin therapy, as she wants to see the impact of lifestyle changes on her lab work first . Allergies: levocetirizine 10 mg daily, fluticasone nasal daily. Allergies improved since switch to levocetirizine  Pharmacist Clinical Goal(s):  [name] Over the next 90 days, patient with work with PharmD and primary care provider to address optimized diabetes management  Interventions: . Comprehensive medication review performed, medication list updated in electronic medical record . Inter-disciplinary care team collaboration (see longitudinal plan of care) . Reviewed income criteria for Victoza assistance through [employer]. Patient reports that she is over income for assistance. Encouraged to consider if she could afford copay for Victoza until she hits catastrophic coverage, as weight loss is one of her goals. . Encouraged BID blood sugar checks until our next follow up call in May.  Patient Self Care Activities:  . Patient will check blood glucose BID , document, and provide at future appointments . Patient will take medications as prescribed . Patient will report any  questions or concerns to provider   Please see past updates related to this goal by clicking on the "Past Updates" button in the selected goal         Patient verbalizes understanding of instructions provided today.    Plan:  - Will outreach as previously scheduled  Catie Darnelle Maffucci, PharmD, [place], [place] Pharmacist [place] [phone]

## 2019-08-22 NOTE — Chronic Care Management (AMB) (Signed)
Chronic Care Management   Follow Up Note   08/22/2019 Name: Whitney Knight MRN: [id number] DOB: Jul 11, 1950  Referred by: Whitney Haven, MD Reason for referral : Chronic Care Management (Medication Management)   Whitney Knight is a 69 y.o. year old female who is a primary care patient of Whitney Knight, Whitney Adam, MD. The CCM team was consulted for assistance with chronic disease management and care coordination needs.    Contacted patient for medication management review.   Review of patient status, including review of consultants reports, relevant laboratory and other test results, and collaboration with appropriate care team members and the patient's provider was performed as part of comprehensive patient evaluation and provision of chronic care management services.    SDOH (Social Determinants of Health) assessments performed: Yes See Care Plan activities for detailed interventions related to Whitney Knight)     Outpatient Encounter Medications as of 08/22/2019  Medication Sig Note  . Betamethasone Valerate 0.12 % foam Apply to affected area once daily for up to 7 days   . calcium carbonate (TUMS EX) 750 MG chewable tablet Chew by mouth as needed.    Whitney Knight Sodium 30-100 MG CAPS Take 1 tablet by mouth daily. 12/19/2018: Occasionally every night  . cholecalciferol (VITAMIN D) 1000 UNITS tablet Take 1,000 Units by mouth daily.    . Emollient (COLLAGEN EX) liquid   . famotidine (PEPCID) 20 MG tablet Take 1/2 tablet by mouth daily at bedtime. MUST HAVE OFFICE VISIT FOR FURTHER REFILLS   . FEROSUL 325 (65 Fe) MG tablet TAKE ONE TABLET EACH MORNING WITH BREAKFAST   . fluticasone (FLONASE) 50 MCG/ACT nasal spray Place 2 sprays into both nostrils daily.   [name] glimepiride (AMARYL) 2 MG tablet TAKE ONE TABLET EVERY DAY WITH BREAKFAST   . glucose blood (ONE TOUCH ULTRA TEST) test strip CHECK BLOOD SUGAR up to three times daily. Dx code E11.9, Brand: One Whitney Knight Plan #  [id number]   . hydrochlorothiazide (MICROZIDE) 12.5 MG capsule Take 1 capsule (12.5 mg total) by mouth daily.   . Insulin Pen Needle (BD PEN NEEDLE NANO U/F) 32G X 4 MM MISC Inject daily as directed   . levocetirizine (XYZAL) 5 MG tablet Take 5 mg by mouth every evening.   [name] levothyroxine (SYNTHROID) 75 MCG tablet TAKE ONE TABLET EVERY DAY   . liraglutide (VICTOZA) 18 MG/3ML SOPN INJECT SUBCUTANEOUSLY 1.8MG  DAILY   . losartan (COZAAR) 100 MG tablet TAKE ONE TABLET BY MOUTH EVERY DAY   . metFORMIN (GLUCOPHAGE) 1000 MG tablet TAKE ONE TABLET TWICE DAILY WITH A MEAL   . Multiple Vitamins-Minerals (WOMENS MULTIVITAMIN PO) Take by mouth.   . Omega-3 Fatty Acids (FISH OIL) 500 MG CAPS Take 500 tablets by mouth.   [name] omeprazole (PRILOSEC) 40 MG capsule Take 1 capsule (40 mg total) by mouth daily. MUST HAVE OFFICE VISIT FOR FURTHER REFILLS   . OneTouch Delica Lancets [id number] MISC USE 2 TIMES DAILY AS  DIRECTED   . oxybutynin (DITROPAN-XL) 5 MG 24 hr tablet TAKE ONE TABLET EVERY DAY   . POTASSIUM CHLORIDE PO Take 99 mg by mouth. Taking OTC, unsure mg    . Probiotic Product (PROBIOTIC DAILY PO) Take by mouth.   . Zoledronic Acid (RECLAST IV) Inject into the vein. Once a year    No facility-administered encounter medications on file as of 08/22/2019.     Objective:   Goals Addressed            This Visit's  Progress     Patient Stated   . "I want to work on my blood sugars" (pt-stated)       Whitney Knight (see longtitudinal plan of care for additional care plan information)  Current Barriers:  . Diabetes; uncontrolled; last A1c 7.5% o Reported to Whitney Knight that she had hit the Medicare Coverage Gap and had stopped taking Victoza.  . Current antihyperglycemic regimen: metformin 1000 mg BID, Victoza 1.8 mg once daily (not taking right now) glimepiride ~1 mg PRN higher sugars at bedtime  o Intolerance: Jardiance, nausea/vomiting . Cardiovascular risk reduction: o Current hypertensive regimen:  losartan 100 mg daily, HCTZ 12.5 mg daily;  o Current hyperlipidemia regimen: OTC omega 3 fatty acids; 10 year ASCVD risk 18.1%; last LDL 166, last TG 275; resistant to statin therapy, as she wants to see the impact of lifestyle changes on her lab work first . Allergies: levocetirizine 10 mg daily, fluticasone nasal daily. Allergies improved since switch to levocetirizine  Pharmacist Clinical Goal(s):  [name] Over the next 90 days, patient with work with PharmD and primary care provider to address optimized diabetes management  Interventions: . Comprehensive medication review performed, medication list updated in electronic medical record . Inter-disciplinary care team collaboration (see longitudinal plan of care) . Reviewed income criteria for Victoza assistance through [employer]. Patient reports that she is over income for assistance. Encouraged to consider if she could afford copay for Victoza until she hits catastrophic coverage, as weight loss is one of her goals. . Encouraged BID blood sugar checks until our next follow up call in May.  Patient Self Care Activities:  . Patient will check blood glucose BID , document, and provide at future appointments . Patient will take medications as prescribed . Patient will report any questions or concerns to provider   Please see past updates related to this goal by clicking on the "Past Updates" button in the selected goal          Plan:  - Will outreach as previously scheduled  Whitney Knight, PharmD, [place], [place] Pharmacist [place] [phone]

## 2019-09-03 ENCOUNTER — Other Ambulatory Visit: Payer: Self-pay | Admitting: Internal Medicine

## 2019-09-03 ENCOUNTER — Other Ambulatory Visit: Payer: Self-pay | Admitting: Family Medicine

## 2019-09-11 ENCOUNTER — Encounter: Payer: Self-pay | Admitting: Family Medicine

## 2019-09-12 ENCOUNTER — Ambulatory Visit: Payer: Self-pay | Admitting: Pharmacist

## 2019-09-12 NOTE — Chronic Care Management (AMB) (Signed)
  Chronic Care Management   Note  09/12/2019 Name: EVANI BURDI MRN: [id number] DOB: [birth year]  SHAQUANIA KEELAN is a 69 y.o. year old female who is a primary care patient of [name], [name], MD. The CCM team was consulted for assistance with chronic disease management and care coordination needs.    Received notice from the patient that she is no longer interested in CCM services. Closing case now.   Follow up plan: - Patient has my contact information for future questions or concerns.   Catie Darnelle Maffucci, PharmD, [place], CPP Clinical Pharmacist [place] [phone]

## 2019-09-15 ENCOUNTER — Telehealth: Payer: Medicare Other

## 2019-09-21 ENCOUNTER — Other Ambulatory Visit: Payer: Self-pay

## 2019-09-22 ENCOUNTER — Other Ambulatory Visit: Payer: Self-pay | Admitting: Family Medicine

## 2019-09-22 ENCOUNTER — Other Ambulatory Visit: Payer: Self-pay

## 2019-10-03 ENCOUNTER — Other Ambulatory Visit: Payer: Self-pay | Admitting: Internal Medicine

## 2019-10-05 ENCOUNTER — Other Ambulatory Visit: Payer: Self-pay | Admitting: Family Medicine

## 2019-10-06 MED ORDER — HYDROCHLOROTHIAZIDE 12.5 MG PO CAPS: 12.5000 mg | ORAL_CAPSULE | Freq: Every day | ORAL | 0 refills | Status: DC

## 2019-10-10 ENCOUNTER — Other Ambulatory Visit: Payer: Self-pay | Admitting: Family Medicine

## 2019-10-12 ENCOUNTER — Other Ambulatory Visit: Payer: Self-pay | Admitting: Family Medicine

## 2019-10-13 ENCOUNTER — Other Ambulatory Visit: Payer: Self-pay | Admitting: Family Medicine

## 2019-10-13 MED ORDER — HYDROCHLOROTHIAZIDE 12.5 MG PO CAPS: 12.5000 mg | ORAL_CAPSULE | Freq: Every day | ORAL | 0 refills | Status: DC

## 2019-10-23 ENCOUNTER — Other Ambulatory Visit: Payer: Self-pay | Admitting: Family Medicine

## 2019-10-26 ENCOUNTER — Other Ambulatory Visit: Payer: Self-pay | Admitting: Family Medicine

## 2019-10-27 ENCOUNTER — Other Ambulatory Visit: Payer: Self-pay

## 2019-10-27 DIAGNOSIS — D509 Iron deficiency anemia, unspecified: Secondary | ICD-10-CM

## 2019-10-27 LAB — HM DIABETES EYE EXAM

## 2019-10-27 MED ORDER — FERROUS SULFATE 325 (65 FE) MG PO TABS: ORAL_TABLET | ORAL | 0 refills | Status: DC

## 2019-10-27 NOTE — Progress Notes (Signed)
I called and spoke with the patient about her refill on iron pills she stated she is having labs at [facility] clinic in July she needled only a 30 day supply until she has her labs.  I ordered 30 days with no refills until her lab results are done.  Rafay Dahan,cma

## 2019-11-06 ENCOUNTER — Other Ambulatory Visit: Payer: Self-pay | Admitting: Internal Medicine

## 2019-11-06 ENCOUNTER — Other Ambulatory Visit: Payer: Self-pay | Admitting: Family Medicine

## 2019-11-07 ENCOUNTER — Other Ambulatory Visit: Payer: Self-pay

## 2019-11-07 ENCOUNTER — Other Ambulatory Visit: Payer: Self-pay | Admitting: Internal Medicine

## 2019-11-07 MED ORDER — FAMOTIDINE 40 MG PO TABS: 20.0000 mg | ORAL_TABLET | Freq: Every day | ORAL | 0 refills | Status: DC

## 2019-11-07 MED ORDER — FAMOTIDINE 40 MG PO TABS: 20.0000 mg | ORAL_TABLET | Freq: Every day | ORAL | 1 refills | Status: DC

## 2019-11-07 NOTE — Telephone Encounter (Signed)
Prescription sent to patient's pharmacy until scheduled appt with Dr. Hilarie Fredrickson.

## 2019-11-11 ENCOUNTER — Other Ambulatory Visit: Payer: Self-pay | Admitting: Family Medicine

## 2019-11-25 ENCOUNTER — Other Ambulatory Visit: Payer: Self-pay | Admitting: Family Medicine

## 2019-11-25 DIAGNOSIS — M810 Age-related osteoporosis without current pathological fracture: Secondary | ICD-10-CM | POA: Diagnosis not present

## 2019-11-25 DIAGNOSIS — D509 Iron deficiency anemia, unspecified: Secondary | ICD-10-CM

## 2019-12-02 ENCOUNTER — Telehealth: Payer: Self-pay | Admitting: Family Medicine

## 2019-12-02 DIAGNOSIS — E1165 Type 2 diabetes mellitus with hyperglycemia: Secondary | ICD-10-CM

## 2019-12-02 NOTE — Telephone Encounter (Signed)
Patient stated she just had labs at her endocrinologist her name is Whitney Knight, she stated you should see the results in her chart.  Suhaas Agena,cma

## 2019-12-02 NOTE — Telephone Encounter (Signed)
This patient was supposed to have lab work done after her most recent telehealth visit.  Please see if we can get this completed.  Thanks.

## 2019-12-03 ENCOUNTER — Encounter: Payer: Self-pay | Admitting: Family Medicine

## 2019-12-03 NOTE — Telephone Encounter (Signed)
They did not check all the labs that I would want to check.  I would like for her to come in to have the labs if she is willing.  Thanks.

## 2019-12-05 ENCOUNTER — Telehealth (INDEPENDENT_AMBULATORY_CARE_PROVIDER_SITE_OTHER): Payer: Medicare Other | Admitting: Internal Medicine

## 2019-12-05 ENCOUNTER — Encounter: Payer: Self-pay | Admitting: Internal Medicine

## 2019-12-05 VITALS — BP 116/70 | HR 78 | Ht 63.5 in | Wt 168.0 lb

## 2019-12-05 DIAGNOSIS — J0111 Acute recurrent frontal sinusitis: Secondary | ICD-10-CM | POA: Diagnosis not present

## 2019-12-05 DIAGNOSIS — E663 Overweight: Secondary | ICD-10-CM

## 2019-12-05 MED ORDER — DOXYCYCLINE HYCLATE 100 MG PO TABS: 100.0000 mg | ORAL_TABLET | Freq: Two times a day (BID) | ORAL | 0 refills | Status: DC

## 2019-12-05 NOTE — Progress Notes (Signed)
Chief Complaint  Patient presents with  . Cough    Pt c/o nasal drainage and congestion, sneezing, cough, sore throat. No loss of taste or smell. Has attempted to be covid tested but the test was cancelled.  . medication question    Pt asks if she should still take tylenol and Advil because she does not have a fever. Is also taking mucinex and    Sick visit  Sunday had low grade fever 99 F and pnd and nose stuffy, nasal congestion, sore throat and cough dry hackey cough similar to when she had bronchitis in 2013 for 92 days. She has also been sneezing x 2 days bad no wheezing/ no sob. She has tried to schedule covid 19 tests but has been canceled 2x at [facility] and 2 x at [facility] She had tried delsym otc but raised her sugar b/c was not sugar free so doing Robitussin She will try to use nasal saline rinse, zinc, elderberry, vit C, echinancea and nasacort, afrin moisture formulation and xyzal w/o relief  Her ENT is Dr. Tami Ribas and she has had sinus surgery in the past    Review of Systems  Constitutional: Negative for weight loss.  HENT: Negative for hearing loss.   Eyes: Negative for blurred vision.  Respiratory: Negative for shortness of breath.   Cardiovascular: Negative for chest pain.  Gastrointestinal: Negative for abdominal pain.  Musculoskeletal: Negative for falls.  Skin: Negative for rash.  Neurological: Negative for headaches.  Psychiatric/Behavioral: Negative for depression.   Past Medical History:  Diagnosis Date  . Allergy   . Anemia   . Arthritis    KNEES  . Cataract   . Chronic sinusitis   . Cough   . Diabetes mellitus   . Diverticulosis   . GERD (gastroesophageal reflux disease)   . Headache(784.0)    [facility] in past  . Hypertension   . Osteoporosis   . Thyroid disease    Past Surgical History:  Procedure Laterality Date  . bone graft     for dental surgery  . CARPAL TUNNEL RELEASE Right 02/27/2013   [facility]  . COLONOSCOPY  2014  . dental  implants    . DENTAL SURGERY    . NASAL SINUS SURGERY  06/2008   Dr. Tami Ribas  . TRIGGER FINGER RELEASE Right 02/27/2013   [facility]  . UPPER GASTROINTESTINAL ENDOSCOPY    . VAGINAL DELIVERY     2   Family History  Problem Relation Age of Onset  . COPD Father   . Hypertension Father   . Heart disease Father   . Heart disease Mother   . Diabetes Maternal Grandmother   . Colon cancer Neg Hx   . Esophageal cancer Neg Hx   . Pancreatic cancer Neg Hx   . Stomach cancer Neg Hx   . Liver disease Neg Hx    Social History   Socioeconomic History  . Marital status: Married    Spouse name: Not on file  . Number of children: 2  . Years of education: Not on file  . Highest education level: Not on file  Occupational History  . Occupation: [employer]    Employer: [employer]  Tobacco Use  . Smoking status: Never Smoker  . Smokeless tobacco: Never Used  Substance and Sexual Activity  . Alcohol use: Yes    Comment: occ  . Drug use: No  . Sexual activity: Not on file  Other Topics Concern  . Not on file  Social  History Narrative   Lives with husband. No pets, 2 children.      Work - [employer]      Diet - regular   Exercise - none presently   Social Determinants of Radio broadcast assistant Strain:   . Difficulty of Paying Living Expenses:   Food Insecurity:   . Worried About Charity fundraiser in the Last Year:   . Arboriculturist in the Last Year:   Transportation Needs:   . Film/video editor (Medical):   [name] Lack of Transportation (Non-Medical):   Physical Activity:   . Days of Exercise per Week:   . Minutes of Exercise per Session:   Stress:   . Feeling of Stress :   Social Connections:   . Frequency of Communication with Friends and Family:   . Frequency of Social Gatherings with Friends and Family:   . Attends Religious Services:   . Active Member of Clubs or Organizations:   . Attends Archivist Meetings:   [name] Marital Status:   Intimate  Partner Violence:   . Fear of Current or Ex-Partner:   . Emotionally Abused:   [name] Physically Abused:   . Sexually Abused:    Current Meds  Medication Sig  . Betamethasone Valerate 0.12 % foam Apply to affected area once daily for up to 7 days  . calcium carbonate (TUMS EX) 750 MG chewable tablet Chew by mouth as needed.   [name] Sodium 30-100 MG CAPS Take 1 tablet by mouth daily.  . cholecalciferol (VITAMIN D) 1000 UNITS tablet Take 1,000 Units by mouth daily.   . Emollient (COLLAGEN EX) liquid  . famotidine (PEPCID) 40 MG tablet Take 0.5 tablets (20 mg total) by mouth at bedtime. NEEDS TO KEEP UPCOMING OFFICE VISIT FOR ANY FURTHER REFILLS  . FEROSUL 325 (65 Fe) MG tablet TAKE 1 TABLET EACH MORNING WITH BREAKFAST  . fluticasone (FLONASE) 50 MCG/ACT nasal spray USE TWO SPRAYS IN EACH NOSTRIL DAILY  . glimepiride (AMARYL) 2 MG tablet TAKE ONE TABLET EVERY DAY WITH BREAKFAST  . glucose blood (ONE TOUCH ULTRA TEST) test strip CHECK BLOOD SUGAR up to three times daily. Dx code E11.9, Brand: One Rolan Lipa Plan # [id number]  . hydrochlorothiazide (MICROZIDE) 12.5 MG capsule Take 1 capsule (12.5 mg total) by mouth daily.  . Insulin Pen Needle (BD PEN NEEDLE NANO U/F) 32G X 4 MM MISC Inject daily as directed  . levocetirizine (XYZAL) 5 MG tablet Take 5 mg by mouth every evening.  [name] levothyroxine (SYNTHROID) 75 MCG tablet TAKE ONE TABLET EVERY DAY  . losartan (COZAAR) 100 MG tablet TAKE ONE TABLET BY MOUTH EVERY DAY  . metFORMIN (GLUCOPHAGE) 1000 MG tablet TAKE ONE TABLET TWICE DAILY WITH A MEAL  . Multiple Vitamins-Minerals (WOMENS MULTIVITAMIN PO) Take by mouth.  . Omega-3 Fatty Acids (FISH OIL) 500 MG CAPS Take 500 tablets by mouth.  [name] 23J MISC USE 2 TIMES DAILY AS  DIRECTED  . oxybutynin (DITROPAN-XL) 5 MG 24 hr tablet TAKE ONE TABLET EVERY DAY  . POTASSIUM CHLORIDE PO Take 99 mg by mouth. Taking OTC, unsure mg   . Probiotic Product (PROBIOTIC  DAILY PO) Take by mouth.  . Zoledronic Acid (RECLAST IV) Inject into the vein. Once a year   Allergies  Allergen Reactions  . Clarithromycin     REACTION: Nausea  . Jardiance [Empagliflozin] Nausea And Vomiting  . Penicillins     REACTION: Throat swelling  Recent Results (from the past 2160 hour(s))  HM DIABETES EYE EXAM     Status: None   Collection Time: 10/27/19 12:00 AM  Result Value Ref Range   HM Diabetic Eye Exam No Retinopathy No Retinopathy   Objective  Body mass index is 29.29 kg/m. Wt Readings from Last 3 Encounters:  12/05/19 168 lb (76.2 kg)  08/18/19 167 lb 8 oz (76 kg)  07/28/19 167 lb (75.8 kg)   Temp Readings from Last 3 Encounters:  06/26/18 98.3 F (36.8 C) (Oral)  01/10/18 98.9 F (37.2 C) (Other (Comment))  08/23/17 98.1 F (36.7 C) (Oral)   BP Readings from Last 3 Encounters:  12/05/19 116/70  08/18/19 127/68  06/26/18 110/60   Pulse Readings from Last 3 Encounters:  12/05/19 78  06/26/18 82  01/10/18 66    Physical Exam Vitals and nursing note reviewed.  Constitutional:      Appearance: Normal appearance. She is well-groomed and overweight.  HENT:     Head: Normocephalic and atraumatic.  Eyes:     Conjunctiva/sclera: Conjunctivae normal.     Pupils: Pupils are equal, round, and reactive to light.  Cardiovascular:     Rate and Rhythm: Normal rate and regular rhythm.     Heart sounds: Normal heart sounds. No murmur heard.   Pulmonary:     Effort: Pulmonary effort is normal.     Breath sounds: Normal breath sounds.  Skin:    General: Skin is warm and dry.  Neurological:     General: No focal deficit present.     Mental Status: She is alert and oriented to person, place, and time. Mental status is at baseline.     Gait: Gait normal.  Psychiatric:        Attention and Perception: Attention and perception normal.        Mood and Affect: Mood and affect normal.        Speech: Speech normal.        Behavior: Behavior normal.  Behavior is cooperative.        Thought Content: Thought content normal.        Cognition and Memory: Cognition and memory normal.        Judgment: Judgment normal.     Assessment  Plan  Acute recurrent frontal sinusitis - Plan: doxycycline (VIBRA-TABS) 100 MG tablet bid #20  Supportive care otc robitussin dm otc for cough  Ns, nasacort, sparingly use afrin  Mvt  rec covid 19 testing  If not better f/u Dr. Tami Ribas  Warm salt water gargles    Provider: Dr. Olivia Mackie McLean-Scocuzza-[facility]

## 2019-12-06 DIAGNOSIS — U071 COVID-19: Secondary | ICD-10-CM | POA: Diagnosis not present

## 2019-12-08 ENCOUNTER — Telehealth: Payer: Self-pay | Admitting: Family Medicine

## 2019-12-08 NOTE — Telephone Encounter (Signed)
Pt was returning call 

## 2019-12-08 NOTE — Telephone Encounter (Signed)
LMTCB

## 2019-12-08 NOTE — Telephone Encounter (Signed)
Patient scheduled for fasting labs on 12/24/19 at 8:15 am

## 2019-12-08 NOTE — Telephone Encounter (Signed)
Spoke with patient and scheduled her for fasting labs on 12/24/19 at 8:15 am

## 2019-12-19 ENCOUNTER — Other Ambulatory Visit: Payer: Self-pay | Admitting: Internal Medicine

## 2019-12-24 ENCOUNTER — Other Ambulatory Visit (INDEPENDENT_AMBULATORY_CARE_PROVIDER_SITE_OTHER): Payer: Medicare Other

## 2019-12-24 ENCOUNTER — Other Ambulatory Visit: Payer: Self-pay

## 2019-12-24 DIAGNOSIS — E782 Mixed hyperlipidemia: Secondary | ICD-10-CM | POA: Diagnosis not present

## 2019-12-24 DIAGNOSIS — D509 Iron deficiency anemia, unspecified: Secondary | ICD-10-CM | POA: Diagnosis not present

## 2019-12-24 DIAGNOSIS — E1165 Type 2 diabetes mellitus with hyperglycemia: Secondary | ICD-10-CM

## 2019-12-24 DIAGNOSIS — I10 Essential (primary) hypertension: Secondary | ICD-10-CM

## 2019-12-25 LAB — COMPREHENSIVE METABOLIC PANEL
ALT: 32 IU/L (ref 0–32)
AST: 31 IU/L (ref 0–40)
Albumin/Globulin Ratio: 1.5 (ref 1.2–2.2)
Albumin: 3.9 g/dL (ref 3.8–4.8)
Alkaline Phosphatase: 98 IU/L (ref 48–121)
BUN/Creatinine Ratio: 16 (ref 12–28)
BUN: 15 mg/dL (ref 8–27)
Bilirubin Total: 0.6 mg/dL (ref 0.0–1.2)
CO2: 29 mmol/L (ref 20–29)
Calcium: 9.2 mg/dL (ref 8.7–10.3)
Chloride: 99 mmol/L (ref 96–106)
Creatinine, Ser: 0.94 mg/dL (ref 0.57–1.00)
GFR calc Af Amer: 73 mL/min/{1.73_m2} (ref >59)
GFR calc non Af Amer: 63 mL/min/{1.73_m2} (ref >59)
Globulin, Total: 2.6 g/dL (ref 1.5–4.5)
Glucose: 114 mg/dL — ABNORMAL HIGH (ref 65–99)
Potassium: 3.9 mmol/L (ref 3.5–5.2)
Sodium: 139 mmol/L (ref 134–144)
Total Protein: 6.5 g/dL (ref 6.0–8.5)

## 2019-12-25 LAB — CBC
Hematocrit: 39.6 % (ref 34.0–46.6)
Hemoglobin: 13.3 g/dL (ref 11.1–15.9)
MCH: 29.5 pg (ref 26.6–33.0)
MCHC: 33.6 g/dL (ref 31.5–35.7)
MCV: 88 fL (ref 79–97)
Platelets: 213 10*3/uL (ref 150–450)
RBC: 4.51 x10E6/uL (ref 3.77–5.28)
RDW: 12.5 % (ref 11.7–15.4)
WBC: 7.2 10*3/uL (ref 3.4–10.8)

## 2019-12-25 LAB — IRON AND TIBC
Iron Saturation: 23 % (ref 15–55)
Iron: 52 ug/dL (ref 27–139)
Total Iron Binding Capacity: 222 ug/dL — ABNORMAL LOW (ref 250–450)
UIBC: 170 ug/dL (ref 118–369)

## 2019-12-25 LAB — LIPID PANEL
Chol/HDL Ratio: 2.5 ratio (ref 0.0–4.4)
Cholesterol, Total: 148 mg/dL (ref 100–199)
HDL: 60 mg/dL (ref >39)
LDL Chol Calc (NIH): 73 mg/dL (ref 0–99)
Triglycerides: 77 mg/dL (ref 0–149)
VLDL Cholesterol Cal: 15 mg/dL (ref 5–40)

## 2019-12-25 LAB — FERRITIN: Ferritin: 184 ng/mL — ABNORMAL HIGH (ref 15–150)

## 2019-12-25 LAB — HEMOGLOBIN A1C
Est. average glucose Bld gHb Est-mCnc: 166 mg/dL
Hgb A1c MFr Bld: 7.4 % — ABNORMAL HIGH (ref 4.8–5.6)

## 2019-12-30 ENCOUNTER — Other Ambulatory Visit: Payer: Self-pay | Admitting: Family Medicine

## 2019-12-30 DIAGNOSIS — D509 Iron deficiency anemia, unspecified: Secondary | ICD-10-CM

## 2020-01-07 ENCOUNTER — Other Ambulatory Visit: Payer: Self-pay | Admitting: Family Medicine

## 2020-01-20 ENCOUNTER — Ambulatory Visit (INDEPENDENT_AMBULATORY_CARE_PROVIDER_SITE_OTHER): Payer: Medicare Other | Admitting: Internal Medicine

## 2020-01-20 ENCOUNTER — Encounter: Payer: Self-pay | Admitting: Internal Medicine

## 2020-01-20 VITALS — BP 110/78 | HR 85 | Ht 63.5 in | Wt 171.5 lb

## 2020-01-20 DIAGNOSIS — K219 Gastro-esophageal reflux disease without esophagitis: Secondary | ICD-10-CM | POA: Diagnosis not present

## 2020-01-20 DIAGNOSIS — Z862 Personal history of diseases of the blood and blood-forming organs and certain disorders involving the immune mechanism: Secondary | ICD-10-CM | POA: Diagnosis not present

## 2020-01-20 DIAGNOSIS — Z86010 Personal history of colonic polyps: Secondary | ICD-10-CM | POA: Diagnosis not present

## 2020-01-20 NOTE — Patient Instructions (Signed)
We have sent the following medications to your pharmacy for you to pick up at your convenience: Omeprazole 40 mg daily  Discontinue Pepcid daily dosing. You may use pepcid on an as needed basis.  Discontinue iron.  If you are age 67 or older, your body mass index should be between 23-30. Your Body mass index is 29.9 kg/m. If this is out of the aforementioned range listed, please consider follow up with your Primary Care Provider.  If you are age 56 or younger, your body mass index should be between 19-25. Your Body mass index is 29.9 kg/m. If this is out of the aformentioned range listed, please consider follow up with your Primary Care Provider.   Due to recent changes in healthcare laws, you may see the results of your imaging and laboratory studies on MyChart before your provider has had a chance to review them.  We understand that in some cases there may be results that are confusing or concerning to you. Not all laboratory results come back in the same time frame and the provider may be waiting for multiple results in order to interpret others.  Please give [place] 48 hours in order for your provider to thoroughly review all the results before contacting the office for clarification of your results.

## 2020-01-20 NOTE — Progress Notes (Signed)
Subjective:    Patient ID: Whitney Knight, female    DOB: 11/11/1950, 69 y.o.   MRN: [id number]  HPI Whitney Knight is a 69 year old female with a history of GERD, esophageal dysphagia responsive to dilation, history of adenoma of the colon, colonic diverticulosis, history of IDA, diabetes, hypertension, hypothyroidism and osteoporosis who is here for follow-up.  She is here alone today and was last seen by virtual visit in July 2020.  She reports that she is doing and feeling well.  She has been maintained on omeprazole 40 mg daily and is taking famotidine 20 mg most evenings.  She reports her reflux has been well controlled.  She is not having any recurrent dysphagia.  Her bowel movements have been regular.  No abdominal pain.  She has been taking iron supplementation but was recently told by primary care that she likely does not need this.  Her ferritin was slightly elevated.  She has had no blood in her stools or melena.  She is recovering from COVID-19 which she developed in very early August of this year.  Fortunately her case was mild.  Her husband also had symptoms consistent with Covid.  She had a positive home test and then [facility]  She has been recently undergoing dental implants with Dr. Norman Herrlich in [place].   Review of Systems As per HPI, otherwise negative  Current Medications, Allergies, Past Medical History, Past Surgical History, Family History and Social History were reviewed in [employer] record.     Objective:   Physical Exam BP 110/78 (BP Location: Left Arm, Patient Position: Sitting, Cuff Size: Normal)   Pulse 85   Ht 5' 3.5" (1.613 m)   Wt 171 lb 8 oz (77.8 kg)   SpO2 95%   BMI 29.90 kg/m  Gen: awake, alert, NAD HEENT: anicteric CV: RRR, no mrg Pulm: CTA b/l Abd: soft, NT/ND, +BS throughout Ext: no c/c/e Neuro: nonfocal  CBC    Component Value Date/Time   WBC 7.2 12/24/2019 0807   RBC 4.51 12/24/2019 0807    HGB 13.3 12/24/2019 0807   HCT 39.6 12/24/2019 0807   PLT 213 12/24/2019 0807   MCV 88 12/24/2019 0807   MCH 29.5 12/24/2019 0807   MCHC 33.6 12/24/2019 0807   RDW 12.5 12/24/2019 0807   LYMPHSABS 1.5 05/13/2012 0927   EOSABS 0.3 05/13/2012 0927   BASOSABS 0.0 05/13/2012 0927   Iron/TIBC/Ferritin/ %Sat    Component Value Date/Time   IRON 52 12/24/2019 0807   TIBC 222 (L) 12/24/2019 0807   FERRITIN 184 (H) 12/24/2019 0807   IRONPCTSAT 23 12/24/2019 0807   CMP     Component Value Date/Time   NA 139 12/24/2019 0807   K 3.9 12/24/2019 0807   CL 99 12/24/2019 0807   CO2 29 12/24/2019 0807   GLUCOSE 114 (H) 12/24/2019 0807   BUN 15 12/24/2019 0807   CREATININE 0.94 12/24/2019 0807   CALCIUM 9.2 12/24/2019 0807   PROT 6.5 12/24/2019 0807   ALBUMIN 3.9 12/24/2019 0807   AST 31 12/24/2019 0807   ALT 32 12/24/2019 0807   ALKPHOS 98 12/24/2019 0807   BILITOT 0.6 12/24/2019 0807   GFRNONAA 63 12/24/2019 0807   GFRAA 73 12/24/2019 0807        Assessment & Plan:   69 year old female with a history of GERD, esophageal dysphagia responsive to dilation, history of adenoma of the colon, colonic diverticulosis, history of IDA, diabetes, hypertension, hypothyroidism and  osteoporosis who is here for follow-up.   1.  GERD with history of dysphagia responsive to dilatation --symptoms well controlled at present.  I am not sure she needs the Pepcid in the evening.  I Georgina Peer have her stop that though she could still use 20 to 40 mg in the evening if needed for breakthrough symptoms --Continue omeprazole 40 mg once daily --Pepcid only if needed as above  2.  IDA --resolved with previously unremarkable upper and lower endoscopy.  Her ferritin is actually high now and so I would like her to stop oral iron.  Primary care can repeat blood counts and iron studies in about 6 months off of oral iron.  3.  History of tubular adenoma --subcentimeter adenoma in 2019, repeat in 5 to 7 years  Annual  follow-up, sooner if needed  20 minutes total spent today including patient facing time, coordination of care, reviewing medical history/procedures/pertinent radiology studies, and documentation of the encounter.

## 2020-01-28 DIAGNOSIS — Z1231 Encounter for screening mammogram for malignant neoplasm of breast: Secondary | ICD-10-CM | POA: Diagnosis not present

## 2020-01-28 DIAGNOSIS — Z6829 Body mass index (BMI) 29.0-29.9, adult: Secondary | ICD-10-CM | POA: Diagnosis not present

## 2020-01-28 DIAGNOSIS — Z01419 Encounter for gynecological examination (general) (routine) without abnormal findings: Secondary | ICD-10-CM | POA: Diagnosis not present

## 2020-02-04 ENCOUNTER — Other Ambulatory Visit: Payer: Self-pay | Admitting: Family Medicine

## 2020-02-17 ENCOUNTER — Other Ambulatory Visit: Payer: Self-pay | Admitting: Family Medicine

## 2020-02-19 ENCOUNTER — Encounter: Payer: Self-pay | Admitting: Family Medicine

## 2020-02-19 DIAGNOSIS — E119 Type 2 diabetes mellitus without complications: Secondary | ICD-10-CM

## 2020-02-26 MED ORDER — BLOOD GLUCOSE MONITOR KIT: PACK | 0 refills | Status: DC

## 2020-02-26 NOTE — Addendum Note (Signed)
Addended by: Leone Haven on: 02/26/2020 10:04 AM   Modules accepted: Orders

## 2020-03-01 MED ORDER — GLUCOSE BLOOD VI STRP: ORAL_STRIP | 3 refills | Status: DC

## 2020-03-01 MED ORDER — BLOOD GLUCOSE MONITOR KIT: PACK | 0 refills | Status: DC

## 2020-03-01 NOTE — Addendum Note (Signed)
Addended by: Fulton Mole D on: 03/01/2020 09:00 AM   Modules accepted: Orders

## 2020-03-02 ENCOUNTER — Other Ambulatory Visit: Payer: Self-pay | Admitting: Family Medicine

## 2020-03-02 DIAGNOSIS — E119 Type 2 diabetes mellitus without complications: Secondary | ICD-10-CM

## 2020-03-04 ENCOUNTER — Other Ambulatory Visit: Payer: Self-pay | Admitting: Family Medicine

## 2020-03-19 DIAGNOSIS — L814 Other melanin hyperpigmentation: Secondary | ICD-10-CM | POA: Diagnosis not present

## 2020-03-19 DIAGNOSIS — X32XXXA Exposure to sunlight, initial encounter: Secondary | ICD-10-CM | POA: Diagnosis not present

## 2020-03-19 DIAGNOSIS — Z1283 Encounter for screening for malignant neoplasm of skin: Secondary | ICD-10-CM | POA: Diagnosis not present

## 2020-03-19 DIAGNOSIS — L821 Other seborrheic keratosis: Secondary | ICD-10-CM | POA: Diagnosis not present

## 2020-03-19 DIAGNOSIS — D2271 Melanocytic nevi of right lower limb, including hip: Secondary | ICD-10-CM | POA: Diagnosis not present

## 2020-03-19 DIAGNOSIS — D225 Melanocytic nevi of trunk: Secondary | ICD-10-CM | POA: Diagnosis not present

## 2020-03-19 DIAGNOSIS — D2262 Melanocytic nevi of left upper limb, including shoulder: Secondary | ICD-10-CM | POA: Diagnosis not present

## 2020-03-19 DIAGNOSIS — D2272 Melanocytic nevi of left lower limb, including hip: Secondary | ICD-10-CM | POA: Diagnosis not present

## 2020-03-19 DIAGNOSIS — D2261 Melanocytic nevi of right upper limb, including shoulder: Secondary | ICD-10-CM | POA: Diagnosis not present

## 2020-03-20 ENCOUNTER — Other Ambulatory Visit: Payer: Self-pay

## 2020-03-22 MED ORDER — OMEPRAZOLE 40 MG PO CPDR
40.0000 mg | DELAYED_RELEASE_CAPSULE | Freq: Every day | ORAL | 2 refills | Status: DC
Start: 2020-03-22 — End: 2021-04-04

## 2020-03-24 ENCOUNTER — Encounter: Payer: Self-pay | Admitting: Family Medicine

## 2020-03-24 ENCOUNTER — Ambulatory Visit (INDEPENDENT_AMBULATORY_CARE_PROVIDER_SITE_OTHER): Payer: Medicare Other | Admitting: Family Medicine

## 2020-03-24 ENCOUNTER — Other Ambulatory Visit: Payer: Self-pay

## 2020-03-24 VITALS — BP 110/70 | HR 79 | Temp 97.9°F | Ht 63.5 in | Wt 175.0 lb

## 2020-03-24 DIAGNOSIS — E782 Mixed hyperlipidemia: Secondary | ICD-10-CM

## 2020-03-24 DIAGNOSIS — E669 Obesity, unspecified: Secondary | ICD-10-CM | POA: Diagnosis not present

## 2020-03-24 DIAGNOSIS — E1165 Type 2 diabetes mellitus with hyperglycemia: Secondary | ICD-10-CM

## 2020-03-24 DIAGNOSIS — K219 Gastro-esophageal reflux disease without esophagitis: Secondary | ICD-10-CM

## 2020-03-24 DIAGNOSIS — I10 Essential (primary) hypertension: Secondary | ICD-10-CM | POA: Diagnosis not present

## 2020-03-24 NOTE — Assessment & Plan Note (Signed)
Recently started with dietary changes.  She will continue with those.

## 2020-03-24 NOTE — Assessment & Plan Note (Signed)
Check LDL. 

## 2020-03-24 NOTE — Assessment & Plan Note (Signed)
Seems to be uncontrolled.  She will continue Metformin and glimepiride.  Check A1c.  Consider Ozempic pending A1c.  Discussed making sure she eats adequately to prevent low blood sugars.

## 2020-03-24 NOTE — Patient Instructions (Signed)
Nice to see you. We will get labs today and contact you with the results. Please try to make sure you get enough to eat in the morning to help prevent low blood sugars.

## 2020-03-24 NOTE — Assessment & Plan Note (Signed)
Well-controlled.  Continue losartan and HCTZ.  Check labs.

## 2020-03-24 NOTE — Progress Notes (Signed)
Tommi Rumps, MD Phone: [phone]  Whitney Knight is a 69 y.o. female who presents today for f/u.  HYPERTENSION  Disease Monitoring  Home BP Monitoring similar to today Chest pain- no    Dyspnea- no Medications  Compliance-  Taking losartan 100 mg daily, HCTZ 12.5 mg daily.   DIABETES Disease Monitoring: Blood Sugar ranges-150-250 Polyuria/phagia/dipsia- no      Optho- UTD Medications: Compliance- taking glimeperide prn at night, metformin [phone] mg daily  Hypoglycemic symptoms- a couple of time, sounds like she had not eaten enough  Obesity: Has been working on her diet.  Has a protein shake for breakfast and then has a bar or blueberry bites for a snack.  Eats a sandwich and popcorn for lunch.  Has a cheese stick and crackers or carb control yogurt for snack in the afternoon.  Dinner is typically a meat and vegetable though does vary if she is out.  GERD:   Reflux symptoms: no   EGD: 2019 with stenosis  Medication: omeprazole 40 mg daily Follows with GI.   Social History   Tobacco Use  Smoking Status Never Smoker  Smokeless Tobacco Never Used     ROS see history of present illness  Objective  Physical Exam Vitals:   03/24/20 1100  BP: 110/70  Pulse: 79  Temp: 97.9 F (36.6 C)  SpO2: 98%    BP Readings from Last 3 Encounters:  03/24/20 110/70  01/20/20 110/78  12/05/19 116/70   Wt Readings from Last 3 Encounters:  03/24/20 175 lb (79.4 kg)  01/20/20 171 lb 8 oz (77.8 kg)  12/05/19 168 lb (76.2 kg)    Physical Exam Constitutional:      General: She is not in acute distress.    Appearance: She is not diaphoretic.  Cardiovascular:     Rate and Rhythm: Normal rate and regular rhythm.     Heart sounds: Normal heart sounds.  Pulmonary:     Effort: Pulmonary effort is normal.     Breath sounds: Normal breath sounds.  Musculoskeletal:     Right lower leg: No edema.     Left lower leg: No edema.  Skin:    General: Skin is warm and dry.   Neurological:     Mental Status: She is alert.      Assessment/Plan: Please see individual problem list.  Problem List Items Addressed This Visit    Diabetes mellitus type 2, uncontrolled ([place])    Seems to be uncontrolled.  She will continue Metformin and glimepiride.  Check A1c.  Consider Ozempic pending A1c.  Discussed making sure she eats adequately to prevent low blood sugars.      Relevant Orders   HgB A1c   GERD (gastroesophageal reflux disease)    Well-controlled.  She will continue omeprazole 40 mg once daily.      Hyperlipidemia    Check LDL.      Relevant Orders   Direct LDL   Hypertension - Primary    Well-controlled.  Continue losartan and HCTZ.  Check labs.      Relevant Orders   Basic Metabolic Panel (BMET)   Obesity (BMI 30.0-34.9)    Recently started with dietary changes.  She will continue with those.          Health Maintenance: Patient received her mammogram through her gynecologist last January.  She declines Covid vaccine and flu vaccine.  She notes there is no way I can change her mind.   This visit occurred during  the SARS-CoV-2 public health emergency.  Safety protocols were in place, including screening questions prior to the visit, additional usage of staff PPE, and extensive cleaning of exam room while observing appropriate contact time as indicated for disinfecting solutions.    Tommi Rumps, MD [place]

## 2020-03-24 NOTE — Assessment & Plan Note (Signed)
Well-controlled.  She will continue omeprazole 40 mg once daily.

## 2020-03-25 LAB — BASIC METABOLIC PANEL
BUN/Creatinine Ratio: 23 (ref 12–28)
BUN: 20 mg/dL (ref 8–27)
CO2: 27 mmol/L (ref 20–29)
Calcium: 9.8 mg/dL (ref 8.7–10.3)
Chloride: 97 mmol/L (ref 96–106)
Creatinine, Ser: 0.88 mg/dL (ref 0.57–1.00)
GFR calc Af Amer: 79 mL/min/{1.73_m2} (ref >59)
GFR calc non Af Amer: 68 mL/min/{1.73_m2} (ref >59)
Glucose: 139 mg/dL — ABNORMAL HIGH (ref 65–99)
Potassium: 3.9 mmol/L (ref 3.5–5.2)
Sodium: 137 mmol/L (ref 134–144)

## 2020-03-25 LAB — LDL CHOLESTEROL, DIRECT: LDL Direct: 89 mg/dL (ref 0–99)

## 2020-03-25 LAB — HEMOGLOBIN A1C
Est. average glucose Bld gHb Est-mCnc: 177 mg/dL
Hgb A1c MFr Bld: 7.8 % — ABNORMAL HIGH (ref 4.8–5.6)

## 2020-04-02 ENCOUNTER — Encounter: Payer: Self-pay | Admitting: Family Medicine

## 2020-04-03 ENCOUNTER — Other Ambulatory Visit: Payer: Self-pay | Admitting: Family Medicine

## 2020-04-06 MED ORDER — OZEMPIC (0.25 OR 0.5 MG/DOSE) 2 MG/1.5ML ~~LOC~~ SOPN: PEN_INJECTOR | SUBCUTANEOUS | 1 refills | Status: DC

## 2020-04-20 ENCOUNTER — Other Ambulatory Visit: Payer: Self-pay | Admitting: Internal Medicine

## 2020-04-20 ENCOUNTER — Other Ambulatory Visit: Payer: Self-pay | Admitting: Family Medicine

## 2020-04-23 ENCOUNTER — Other Ambulatory Visit: Payer: Self-pay | Admitting: Family Medicine

## 2020-05-18 DIAGNOSIS — H905 Unspecified sensorineural hearing loss: Secondary | ICD-10-CM | POA: Diagnosis not present

## 2020-06-07 ENCOUNTER — Encounter: Payer: Self-pay | Admitting: Family Medicine

## 2020-06-10 MED ORDER — VICTOZA 18 MG/3ML ~~LOC~~ SOPN: PEN_INJECTOR | SUBCUTANEOUS | 3 refills | Status: DC

## 2020-06-15 ENCOUNTER — Other Ambulatory Visit: Payer: Self-pay

## 2020-06-15 DIAGNOSIS — E1165 Type 2 diabetes mellitus with hyperglycemia: Secondary | ICD-10-CM

## 2020-06-15 MED ORDER — BD PEN NEEDLE NANO U/F 32G X 4 MM MISC: 3 refills | Status: DC

## 2020-06-19 ENCOUNTER — Other Ambulatory Visit: Payer: Self-pay | Admitting: Family Medicine

## 2020-07-02 ENCOUNTER — Ambulatory Visit: Payer: Medicare Other | Admitting: Family Medicine

## 2020-07-07 ENCOUNTER — Ambulatory Visit (INDEPENDENT_AMBULATORY_CARE_PROVIDER_SITE_OTHER): Payer: Medicare Other | Admitting: Family Medicine

## 2020-07-07 ENCOUNTER — Other Ambulatory Visit: Payer: Self-pay

## 2020-07-07 ENCOUNTER — Encounter: Payer: Self-pay | Admitting: Family Medicine

## 2020-07-07 ENCOUNTER — Telehealth: Payer: Self-pay

## 2020-07-07 VITALS — BP 130/70 | HR 81 | Temp 98.1°F | Ht 63.0 in | Wt 174.0 lb

## 2020-07-07 DIAGNOSIS — E039 Hypothyroidism, unspecified: Secondary | ICD-10-CM

## 2020-07-07 DIAGNOSIS — I10 Essential (primary) hypertension: Secondary | ICD-10-CM

## 2020-07-07 DIAGNOSIS — E669 Obesity, unspecified: Secondary | ICD-10-CM

## 2020-07-07 DIAGNOSIS — K219 Gastro-esophageal reflux disease without esophagitis: Secondary | ICD-10-CM | POA: Diagnosis not present

## 2020-07-07 DIAGNOSIS — E1165 Type 2 diabetes mellitus with hyperglycemia: Secondary | ICD-10-CM

## 2020-07-07 DIAGNOSIS — I7300 Raynaud's syndrome without gangrene: Secondary | ICD-10-CM | POA: Diagnosis not present

## 2020-07-07 DIAGNOSIS — E66811 Obesity, class 1: Secondary | ICD-10-CM

## 2020-07-07 MED ORDER — AMLODIPINE BESYLATE 5 MG PO TABS: 5.0000 mg | ORAL_TABLET | Freq: Every day | ORAL | 3 refills | Status: DC

## 2020-07-07 MED ORDER — FREESTYLE LIBRE 14 DAY SENSOR MISC: 1 refills | Status: DC

## 2020-07-07 MED ORDER — FREESTYLE LIBRE 14 DAY READER DEVI
0 refills | Status: DC
Start: 2020-07-07 — End: 2020-10-11

## 2020-07-07 NOTE — Patient Instructions (Signed)
Nice to see you. Please start on the amlodipine.  We will have you stop the losartan and see if the cough improves. Please continue with the omeprazole given your history of esophageal stenosis.  If you have worsening reflux symptoms please let us know. We will check lab work today and contact you with the results. Please see if you can get me a copy of your mammogram.

## 2020-07-07 NOTE — Assessment & Plan Note (Signed)
Patient's describes symptoms of blue fingertips when it is cold are consistent with Raynaud's phenomenon.  I discussed that the amlodipine may help with this.

## 2020-07-07 NOTE — Telephone Encounter (Signed)
Pt states that [facility] did not receive rx for the glucometer. Freestyle Libre? They did get the amlodopine though. Please send and call pt

## 2020-07-07 NOTE — Assessment & Plan Note (Signed)
The patient is concerned that losartan is contributing to her chronic cough.  We will try switching this to amlodipine 5 mg once daily and continue her on HCTZ 12.5 mg daily.  She will follow up in about a month for recheck.  She will let us know if her blood pressure trends up with this change.

## 2020-07-07 NOTE — Assessment & Plan Note (Signed)
Check A1c.  She will continue Victoza 1.2 mg once daily.  We will see about freestyle libre though I did advise that Medicare may not cover this as she is not on insulin.

## 2020-07-07 NOTE — Progress Notes (Signed)
Whitney Rumps, MD Phone: [phone]  Whitney Knight is a 70 y.o. female who presents today for f/u.  DIABETES Disease Monitoring: Blood Sugar ranges-fasting 93-120 Polyuria/phagia/dipsia- no      Optho- UTD Medications: Compliance- taking victoza 1.2 mg daily, prn use of glimeperide Hypoglycemic symptoms- none recently  HYPERTENSION  Disease Monitoring  Home BP Monitoring notes it is good at home Chest pain- no    Dyspnea- no Medications  Compliance-  Taking HCTZ, losartan, feels that a chronic dry cough is related to her losartan. Started after her pill was changed from the combination tablet to the individual tablet.    GERD:   Reflux symptoms: no   Abd pain: no   Blood in stool: no  Dysphagia: no   EGD: in past with stenosis dilated, most recently had an EGD on 01/10/2018, recommended to be on omeprazole by GI at that time Medication: omeprazole and pepcid  Obesity: Patient started a diet called upper tibia.  She has bars and shakes.  She also eats a sensible meal at night.  She joined the gym and is can start exercising.  She is down 3.8 pounds in the last 5 days.  Raynaud's syndrome: Patient notes the tips of her fingers turn blue only when she is cold.  No other issues when she is warm.   Social History   Tobacco Use  Smoking Status Never Smoker  Smokeless Tobacco Never Used    Current Outpatient Medications on File Prior to Visit  Medication Sig Dispense Refill  . Betamethasone Valerate 0.12 % foam Apply to affected area once daily for up to 7 days 100 g 0  . blood glucose meter kit and supplies KIT Dispense based on patient and insurance preference. Use once daily as directed. (FOR ICD-10 E11.9). 1 each 0  . calcium carbonate (TUMS EX) 750 MG chewable tablet Chew by mouth as needed.     [name] Sodium 30-100 MG CAPS Take 1 tablet by mouth daily. 90 each 3  . cholecalciferol (VITAMIN D) 1000 UNITS tablet Take 1,000 Units by mouth daily.     [name]  doxycycline (VIBRA-TABS) 100 MG tablet Take 1 tablet (100 mg total) by mouth 2 (two) times daily. X 7-10 days 20 tablet 0  . Emollient (COLLAGEN EX) liquid    . fluticasone (FLONASE) 50 MCG/ACT nasal spray USE TWO SPRAYS IN EACH NOSTRIL DAILY 16 g 1  . glimepiride (AMARYL) 2 MG tablet TAKE ONE TABLET EVERY DAY WITH BREAKFAST 90 tablet 1  . glucose blood (ONE TOUCH ULTRA TEST) test strip CHECK BLOOD SUGAR up to three times daily. Dx code E11.9, Brand: One touch Corey Skains Plan # [id number] 400 each 3  . hydrochlorothiazide (MICROZIDE) 12.5 MG capsule Take 1 capsule (12.5 mg total) by mouth daily. 90 capsule 0  . Insulin Pen Needle (BD PEN NEEDLE NANO U/F) 32G X 4 MM MISC Inject daily as directed. 90 each 3  . Lancets (ONETOUCH DELICA PLUS [id number]) MISC USE ONCE DAILY AS DIRECTED 100 each 1  . levocetirizine (XYZAL) 5 MG tablet Take 5 mg by mouth every evening.    [name] levothyroxine (SYNTHROID) 75 MCG tablet TAKE ONE TABLET EVERY DAY 90 tablet 1  . liraglutide (VICTOZA) 18 MG/3ML SOPN Inject 0.6 mg into the skin daily for 7 days, THEN 1.2 mg daily. Start 0.38m SQ once a day for 7 days, then increase to 1.22monce a day. 6 mL 3  . metFORMIN (GLUCOPHAGE) 1000 MG tablet TAKE ONE TABLET  TWICE DAILY WITH A MEAL 90 tablet 1  . Multiple Vitamins-Minerals (WOMENS MULTIVITAMIN PO) Take 1 tablet by mouth in the morning and at bedtime.     . Omega-3 Fatty Acids (FISH OIL) 500 MG CAPS Take 500 tablets by mouth.    [name] omeprazole (PRILOSEC) 40 MG capsule Take 1 capsule (40 mg total) by mouth daily. 90 capsule 2  . oxybutynin (DITROPAN-XL) 5 MG 24 hr tablet TAKE ONE TABLET EVERY DAY 90 tablet 1  . POTASSIUM CHLORIDE PO Take 99 mg by mouth. Taking OTC, unsure mg    . Probiotic Product (PROBIOTIC DAILY PO) Take by mouth.    . Zoledronic Acid (RECLAST IV) Inject into the vein. Once a year     No current facility-administered medications on file prior to visit.     ROS see history of present  illness  Objective  Physical Exam Vitals:   07/07/20 1341  BP: 130/70  Pulse: 81  Temp: 98.1 F (36.7 C)  SpO2: 95%    BP Readings from Last 3 Encounters:  07/07/20 130/70  03/24/20 110/70  01/20/20 110/78   Wt Readings from Last 3 Encounters:  07/07/20 174 lb (78.9 kg)  03/24/20 175 lb (79.4 kg)  01/20/20 171 lb 8 oz (77.8 kg)    Physical Exam Constitutional:      General: She is not in acute distress.    Appearance: She is not diaphoretic.  Cardiovascular:     Rate and Rhythm: Normal rate and regular rhythm.     Heart sounds: Normal heart sounds.  Pulmonary:     Effort: Pulmonary effort is normal.     Breath sounds: Normal breath sounds.  Musculoskeletal:        General: No edema.     Right lower leg: No edema.     Left lower leg: No edema.  Skin:    General: Skin is warm and dry.  Neurological:     Mental Status: She is alert.      Assessment/Plan: Please see individual problem list.  Problem List Items Addressed This Visit    Diabetes mellitus type 2, uncontrolled ([place])    Check A1c.  She will continue Victoza 1.2 mg once daily.  We will see about freestyle libre though I did advise that Medicare may not cover this as she is not on insulin.      Relevant Medications   Continuous Blood Gluc Receiver (FREESTYLE LIBRE 14 DAY READER) DEVI   Continuous Blood Gluc Sensor (FREESTYLE LIBRE 14 DAY SENSOR) MISC   Other Relevant Orders   HgB A1c   GERD (gastroesophageal reflux disease)    Given history of esophageal stenosis I encouraged her to continue on her omeprazole.  Discussed that she could also continue on Pepcid at night.      Hypertension - Primary    The patient is concerned that losartan is contributing to her chronic cough.  We will try switching this to amlodipine 5 mg once daily and continue her on HCTZ 12.5 mg daily.  She will follow up in about a month for recheck.  She will let us know if her blood pressure trends up with this change.       Relevant Medications   amLODipine (NORVASC) 5 MG tablet   Hypothyroidism   Relevant Orders   TSH   Obesity (BMI 30.0-34.9)    She will start exercising.  Discussed healthy diet.      Raynaud's phenomenon    Patient's describes symptoms of  blue fingertips when it is cold are consistent with Raynaud's phenomenon.  I discussed that the amlodipine may help with this.          Health Maintenance: She will try to get [place] a copy of her mammogram.   This visit occurred during the SARS-CoV-2 public health emergency.  Safety protocols were in place, including screening questions prior to the visit, additional usage of staff PPE, and extensive cleaning of exam room while observing appropriate contact time as indicated for disinfecting solutions.   I have spent 41 minutes in the care of this patient regarding history taking, documentation, physical exam, discussion of plan, review of most recent EGD report.   Whitney Rumps, MD [place]

## 2020-07-07 NOTE — Assessment & Plan Note (Signed)
She will start exercising.  Discussed healthy diet.

## 2020-07-07 NOTE — Assessment & Plan Note (Signed)
Given history of esophageal stenosis I encouraged her to continue on her omeprazole.  Discussed that she could also continue on Pepcid at night.

## 2020-07-08 ENCOUNTER — Encounter: Payer: Self-pay | Admitting: Family Medicine

## 2020-07-08 ENCOUNTER — Other Ambulatory Visit: Payer: Self-pay

## 2020-07-08 LAB — HEMOGLOBIN A1C
Est. average glucose Bld gHb Est-mCnc: 163 mg/dL
Hgb A1c MFr Bld: 7.3 % — ABNORMAL HIGH (ref 4.8–5.6)

## 2020-07-08 LAB — TSH: TSH: 1.39 u[IU]/mL (ref 0.450–4.500)

## 2020-07-08 NOTE — Telephone Encounter (Signed)
I called [facility] and they gave me the number to [facility] [phone] to get the freestyle libre because the insurance stated that this is who they used.  I called to [facility] and they created the patient a new account and took the information and they stated they would call the patient to inform her of the outcome of her insurance and I notified the patient that she would receive a  call and she understood.  Whitney Knight,cma

## 2020-07-14 ENCOUNTER — Other Ambulatory Visit: Payer: Self-pay | Admitting: Family Medicine

## 2020-07-14 ENCOUNTER — Encounter: Payer: Self-pay | Admitting: Family Medicine

## 2020-07-15 MED ORDER — VICTOZA 18 MG/3ML ~~LOC~~ SOPN: 1.8000 mg | PEN_INJECTOR | Freq: Every day | SUBCUTANEOUS | 1 refills | Status: DC

## 2020-07-19 ENCOUNTER — Encounter: Payer: Self-pay | Admitting: Internal Medicine

## 2020-07-22 DIAGNOSIS — M810 Age-related osteoporosis without current pathological fracture: Secondary | ICD-10-CM | POA: Diagnosis not present

## 2020-07-22 DIAGNOSIS — E559 Vitamin D deficiency, unspecified: Secondary | ICD-10-CM | POA: Diagnosis not present

## 2020-07-22 DIAGNOSIS — E569 Vitamin deficiency, unspecified: Secondary | ICD-10-CM | POA: Diagnosis not present

## 2020-07-28 ENCOUNTER — Ambulatory Visit: Payer: Medicare Other

## 2020-07-29 DIAGNOSIS — M810 Age-related osteoporosis without current pathological fracture: Secondary | ICD-10-CM | POA: Diagnosis not present

## 2020-08-05 DIAGNOSIS — Z794 Long term (current) use of insulin: Secondary | ICD-10-CM | POA: Diagnosis not present

## 2020-08-05 DIAGNOSIS — E119 Type 2 diabetes mellitus without complications: Secondary | ICD-10-CM | POA: Diagnosis not present

## 2020-08-10 ENCOUNTER — Ambulatory Visit: Payer: Medicare Other | Admitting: Family Medicine

## 2020-08-11 ENCOUNTER — Other Ambulatory Visit: Payer: Self-pay | Admitting: Family Medicine

## 2020-08-16 ENCOUNTER — Other Ambulatory Visit: Payer: Self-pay | Admitting: Family Medicine

## 2020-09-04 DIAGNOSIS — Z794 Long term (current) use of insulin: Secondary | ICD-10-CM | POA: Diagnosis not present

## 2020-09-04 DIAGNOSIS — E119 Type 2 diabetes mellitus without complications: Secondary | ICD-10-CM | POA: Diagnosis not present

## 2020-09-17 ENCOUNTER — Ambulatory Visit (INDEPENDENT_AMBULATORY_CARE_PROVIDER_SITE_OTHER): Payer: Medicare Other

## 2020-09-17 ENCOUNTER — Other Ambulatory Visit: Payer: Self-pay

## 2020-09-17 VITALS — BP 117/75 | HR 77 | Temp 97.0°F | Ht 63.0 in | Wt 167.5 lb

## 2020-09-17 DIAGNOSIS — Z0000 Encounter for general adult medical examination without abnormal findings: Secondary | ICD-10-CM | POA: Diagnosis not present

## 2020-09-17 DIAGNOSIS — E119 Type 2 diabetes mellitus without complications: Secondary | ICD-10-CM | POA: Diagnosis not present

## 2020-09-17 NOTE — Progress Notes (Addendum)
Subjective:   Whitney Knight is a 70 y.o. female who presents for Medicare Annual (Subsequent) preventive examination.  Review of Systems    No ROS.  Medicare Wellness    Cardiac Risk Factors include: advanced age (>63mn, >>62women);hypertension;diabetes mellitus     Objective:    Today's Vitals   09/17/20 1006  BP: 117/75  Pulse: 77  Temp: (!) 97 F (36.1 C)  SpO2: 99%  Weight: 167 lb 8 oz (76 kg)  Height: _0  (1.6 m)   Body mass index is 29.67 kg/m.  Advanced Directives 09/17/2020 07/28/2019 05/15/2017 05/18/2015  Does Patient Have a Medical Advance Directive? Yes Yes No Yes  Type of [place]amed[place]of [place] will [place] - [place] will  Does patient want to make changes to medical advance directive? No - Patient declined No - Patient declined - -  Copy of [place]? No - copy requested No - copy requested - -  Would patient like information on creating a medical advance directive? - - No - Patient declined -    Current Medications (verified) Outpatient Encounter Medications as of 09/17/2020  Medication Sig  . amLODipine (NORVASC) 5 MG tablet Take 1 tablet (5 mg total) by mouth daily.  . Betamethasone Valerate 0.12 % foam Apply to affected area once daily for up to 7 days  . blood glucose meter kit and supplies KIT Dispense based on patient and insurance preference. Use once daily as directed. (FOR ICD-10 E11.9).  . calcium carbonate (TUMS EX) 750 MG chewable tablet Chew by mouth as needed.   .Sarajane MarekSodium 30-100 MG CAPS Take 1 tablet by mouth daily.  . cholecalciferol (VITAMIN D) 1000 UNITS tablet Take 1,000 Units by mouth daily.   . Continuous Blood Gluc Receiver (FREESTYLE LIBRE 14 DAY READER) DEVI Use to check glucose 3-6 times daily.  Diagnosis code E11.65.  .Marland KitchenContinuous Blood Gluc Sensor (FREESTYLE LIBRE 14 DAY SENSOR) MISC Apply  once every 14 days.  .Marland Kitchendoxycycline (VIBRA-TABS) 100 MG tablet Take 1 tablet (100 mg total) by mouth 2 (two) times daily. X 7-10 days  . Emollient (COLLAGEN EX) liquid  . fluticasone (FLONASE) 50 MCG/ACT nasal spray USE TWO SPRAYS IN EACH NOSTRIL DAILY  . glimepiride (AMARYL) 2 MG tablet TAKE ONE TABLET EVERY DAY WITH BREAKFAST  . glucose blood (ONE TOUCH ULTRA TEST) test strip CHECK BLOOD SUGAR up to three times daily. Dx code E11.9, Brand: One tRolan LipaPlan # [id number] . hydrochlorothiazide (MICROZIDE) 12.5 MG capsule TAKE 1 CAPSULE BY MOUTH ONCE DAILY  . Insulin Pen Needle (BD PEN NEEDLE NANO U/F) 32G X 4 MM MISC Inject daily as directed.  . Lancets (ONETOUCH DELICA PLUS [id number] MISC USE ONCE DAILY AS DIRECTED  . levocetirizine (XYZAL) 5 MG tablet Take 5 mg by mouth every evening.  .Marland Kitchenlevothyroxine (SYNTHROID) 75 MCG tablet TAKE ONE TABLET EVERY DAY  . liraglutide (VICTOZA) 18 MG/3ML SOPN Inject 1.8 mg into the skin daily.  .Marland Kitchenlosartan (COZAAR) 100 MG tablet TAKE 1 TABLET BY MOUTH EVERY DAY.  . metFORMIN (GLUCOPHAGE) 1000 MG tablet TAKE ONE TABLET TWICE DAILY WITH A MEAL (Patient not taking: Reported on 09/17/2020)  . Multiple Vitamins-Minerals (WOMENS MULTIVITAMIN PO) Take 1 tablet by mouth in the morning and at bedtime.   . Omega-3 Fatty Acids (FISH OIL) 500 MG CAPS Take 500 tablets by mouth.  .Marland Kitchenomeprazole (PRILOSEC) 40 MG  capsule Take 1 capsule (40 mg total) by mouth daily.  [name] oxybutynin (DITROPAN-XL) 5 MG 24 hr tablet TAKE ONE TABLET EVERY DAY  . POTASSIUM CHLORIDE PO Take 99 mg by mouth. Taking OTC, unsure mg  . Probiotic Product (PROBIOTIC DAILY PO) Take by mouth.  . Zoledronic Acid (RECLAST IV) Inject into the vein. Once a year   No facility-administered encounter medications on file as of 09/17/2020.    Allergies (verified) Clarithromycin, Jardiance [empagliflozin], and Penicillins   History: Past Medical History:  Diagnosis Date  . Allergy   . Anemia   .  Arthritis    KNEES  . Cataract   . Chronic sinusitis   . Cough   . Diabetes mellitus   . Diverticulosis   . GERD (gastroesophageal reflux disease)   . Headache(784.0)    [facility] in past  . Hypertension   . Osteoporosis   . Thyroid disease    Past Surgical History:  Procedure Laterality Date  . bone graft     for dental surgery  . CARPAL TUNNEL RELEASE Right 02/27/2013   [facility]  . COLONOSCOPY  2014  . dental implants    . DENTAL SURGERY    . DENTAL SURGERY     dental implant  . NASAL SINUS SURGERY  06/2008   Dr. Tami Ribas  . TRIGGER FINGER RELEASE Right 02/27/2013   [facility]  . UPPER GASTROINTESTINAL ENDOSCOPY    . VAGINAL DELIVERY     2   Family History  Problem Relation Age of Onset  . COPD Father   . Hypertension Father   . Heart disease Father   . Heart disease Mother   . Diabetes Maternal Grandmother   . Colon cancer Neg Hx   . Esophageal cancer Neg Hx   . Pancreatic cancer Neg Hx   . Stomach cancer Neg Hx   . Liver disease Neg Hx    Social History   Socioeconomic History  . Marital status: Married    Spouse name: Not on file  . Number of children: 2  . Years of education: Not on file  . Highest education level: Not on file  Occupational History  . Occupation: [employer]    Employer: [employer]  Tobacco Use  . Smoking status: Never Smoker  . Smokeless tobacco: Never Used  Vaping Use  . Vaping Use: Never used  Substance and Sexual Activity  . Alcohol use: Yes    Comment: occ  . Drug use: No  . Sexual activity: Not on file  Other Topics Concern  . Not on file  Social History Narrative   Lives with husband. No pets, 2 children.      Work - [employer]      Diet - regular   Exercise - none presently   Social Determinants of Radio broadcast assistant Strain: Low Risk   . Difficulty of Paying Living Expenses: Not hard at all  Food Insecurity: No Food Insecurity  . Worried About Charity fundraiser in the Last Year: Never true   . Ran Out of Food in the Last Year: Never true  Transportation Needs: No Transportation Needs  . Lack of Transportation (Medical): No  . Lack of Transportation (Non-Medical): No  Physical Activity: Insufficiently Active  . Days of Exercise per Week: 2 days  . Minutes of Exercise per Session: 30 min  Stress: No Stress Concern Present  . Feeling of Stress : Not at all  Social Connections: Unknown  . Frequency  of Communication with Friends and Family: Not on file  . Frequency of Social Gatherings with Friends and Family: Not on file  . Attends Religious Services: Not on file  . Active Member of Clubs or Organizations: Not on file  . Attends Archivist Meetings: Not on file  . Marital Status: Married    Tobacco Counseling Counseling given: Not Answered   Clinical Intake:  Pre-visit preparation completed: Yes        Diabetes: Yes (Follwed by pcp)  How often do you need to have someone help you when you read instructions, pamphlets, or other written materials from your doctor or pharmacy?: 1 - Never  Nutrition Risk Assessment: Does the patient have any non-healing wounds?  No  Has the patient had any unintentional weight loss or weight gain?  No   Diabetes: If diabetic, was a CBG obtained today?  Yes  FBS 140 Did the patient bring in their glucometer from home?  No  How often do you monitor your CBG's? Daily.   Financial Strains and Diabetes Management: Are you having any financial strains with the device, your supplies or your medication? No .  Does the patient want to be seen by Chronic Care Management for management of their diabetes?  Yes  Would the patient like to be referred to a Nutritionist or for Diabetic Management?  No   Interpreter Needed?: No      Activities of Daily Living In your present state of health, do you have any difficulty performing the following activities: 09/17/2020  Hearing? Y  Comment Hearing aids  Vision? N  Difficulty  concentrating or making decisions? N  Walking or climbing stairs? N  Dressing or bathing? N  Doing errands, shopping? N  Preparing Food and eating ? N  Using the Toilet? N  In the past six months, have you accidently leaked urine? N  Do you have problems with loss of bowel control? N  Managing your Medications? N  Managing your Finances? N  Housekeeping or managing your Housekeeping? N  Some recent data might be hidden    Patient Care Team: Leone Haven, MD as PCP - General (Family Medicine)  Indicate any recent Medical Services you may have received from other than [facility] providers in the past year (date may be approximate).     Assessment:   This is a routine wellness examination for [place].  Hearing/Vision screen  Hearing Screening   _0  _1  _2  _3  _4  _5  _6  _7  _8   Right ear:           Left ear:           Comments: Hearing aids  Vision Screening Comments: Wears corrective lenses They have seen their ophthalmologist in the last 12 months.     Dietary issues and exercise activities discussed: Current Exercise Habits: Home exercise routine, Type of exercise: walking;calisthenics;treadmill, Time (Minutes): 30, Frequency (Times/Week): 2, Weekly Exercise (Minutes/Week): 60, Intensity: Mild  Optivia diet Good water intake  Goals Addressed              This Visit's Progress     Patient Stated   .  COMPLETED: increase water intake (pt-stated)        Notes adding IV health water (in stores) to encourage more water intake.       Depression Screen PHQ 2/9 Scores 09/17/2020 07/07/2020 03/24/2020 12/05/2019 07/28/2019 02/14/2019  PHQ - 2 Score 0 0 0 0 0 0    Fall Risk Fall Risk  09/17/2020 07/07/2020 03/24/2020 12/05/2019 07/28/2019  Falls in the past year? 0 0 0 0 0  Number falls in past yr: 0 0 0 0 -  Injury with Fall? 0 - - 0 -  Follow up Falls evaluation completed Falls evaluation completed Falls evaluation completed Falls evaluation  completed Falls evaluation completed    [place]: Handrails in use when climbing stairs? Yes Home free of loose throw rugs in walkways, pet beds, electrical cords, etc? Yes  Adequate lighting in your home to reduce risk of falls? Yes   TIMED UP AND GO: Was the test performed? Yes .  Length of time to ambulate 10 feet: 10 sec.   Gait steady and fast without use of assistive device  Cognitive Function:  Patient is alert and oriented x3.  MMSE/6CIT deferred. Normal by direct communication/observaton.    6CIT Screen 07/28/2019  What Year? 0 points  What month? 0 points  What time? 0 points    Immunizations Immunization History  Administered Date(s) Administered  . Fluad Quad(high Dose 65+) 03/13/2019  . Influenza Split 03/01/2014  . Influenza,inj,Quad PF,6+ Mos 01/31/2013, 01/25/2015, 01/25/2018  . Influenza-Unspecified 04/22/2012  . Pneumococcal Polysaccharide-23 10/31/2012  . Td 01/03/2007    Health Maintenance Health Maintenance  Topic Date Due  . MAMMOGRAM  07/26/2019  . OPHTHALMOLOGY EXAM  10/26/2020  . INFLUENZA VACCINE  11/29/2020  . HEMOGLOBIN A1C  01/07/2021  . FOOT EXAM  07/07/2021  . COLONOSCOPY (Pts 45-6yr Insurance coverage will need to be confirmed)  01/11/2023  . DEXA SCAN  Completed  . Hepatitis C Screening  Completed  . HPV VACCINES  Aged Out  . TETANUS/TDAP  Discontinued  . COVID-19 Vaccine  Discontinued  . PNA vac Low Risk Adult  Discontinued   Lung Cancer Screening: (Low Dose CT Chest recommended if Age 68-80years, 30 pack-year currently smoking OR have quit w/in 15years.) does not qualify.   Vision Screening: Recommended annual ophthalmology exams for early detection of glaucoma and other disorders of the eye. Is the patient up to date with their annual eye exam?  Yes   Dental Screening: Recommended annual dental exams for proper oral hygiene. Visits every 6 month.  Community Resource Referral / Chronic  Care Management: CRR required this visit?  No   CCM required this visit?  Yes . Diabetes medication management.     Plan:    Keep all routine maintenance appointments.   I have personally reviewed and noted the following in the patient's chart:   . Medical and social history . Use of alcohol, tobacco or illicit drugs  . Current medications and supplements including opioid prescriptions.  . Functional ability and status . Nutritional status . Physical activity . Advanced directives . List of other physicians . Hospitalizations, surgeries, and ER visits in previous 12 months . Vitals . Screenings to include cognitive, depression, and falls . Referrals and appointments  In addition, I have reviewed and discussed with patient certain preventive protocols, quality metrics, and best practice recommendations. A written personalized care plan for preventive services as well as general preventive health recommendations were provided to patient.     OVarney Biles LPN   [id number]      Agree with plan. MMable Paris NP

## 2020-09-17 NOTE — Patient Instructions (Addendum)
[name] , Thank you for taking time to come for your Medicare Wellness Visit. I appreciate your ongoing commitment to your health goals. Please review the following plan we discussed and let me know if I can assist you in the future.   These are the goals we discussed: Goals      Patient Stated   .  I want to walk more for exercise by using the treadmill (pt-stated)       This is a list of the screening recommended for you and due dates:  Health Maintenance  Topic Date Due  . Mammogram  07/26/2019  . Eye exam for diabetics  10/26/2020  . Flu Shot  11/29/2020  . Hemoglobin A1C  01/07/2021  . Complete foot exam   07/07/2021  . Colon Cancer Screening  01/11/2023  . DEXA scan (bone density measurement)  Completed  . Hepatitis C Screening: USPSTF Recommendation to screen - Ages 3-79 yo.  Completed  . HPV Vaccine  Aged Out  . Tetanus Vaccine  Discontinued  . COVID-19 Vaccine  Discontinued  . Pneumonia vaccines  Discontinued   Immunizations Immunization History  Administered Date(s) Administered  . Fluad Quad(high Dose 65+) 03/13/2019  . Influenza Split 03/01/2014  . Influenza,inj,Quad PF,6+ Mos 01/31/2013, 01/25/2015, 01/25/2018  . Influenza-Unspecified 04/22/2012  . Pneumococcal Polysaccharide-23 10/31/2012  . Td 01/03/2007   Advanced directives: End of life planning; Advance aging; Advanced directives discussed.  Copy of current HCPOA/Living Will requested.    Conditions/risks identified: none new  Follow up in one year for your annual wellness visit    Preventive Care 65 Years and Older, Female Preventive care refers to lifestyle choices and visits with your health care provider that can promote health and wellness. What does preventive care include?  A yearly physical exam. This is also called an annual well check.  Dental exams once or twice a year.  Routine eye exams. Ask your health care provider how often you should have your eyes checked.  Personal  lifestyle choices, including:  Daily care of your teeth and gums.  Regular physical activity.  Eating a healthy diet.  Avoiding tobacco and drug use.  Limiting alcohol use.  Practicing safe sex.  Taking low-dose aspirin every day.  Taking vitamin and mineral supplements as recommended by your health care provider. What happens during an annual well check? The services and screenings done by your health care provider during your annual well check will depend on your age, overall health, lifestyle risk factors, and family history of disease. Counseling  Your health care provider may ask you questions about your:  Alcohol use.  Tobacco use.  Drug use.  Emotional well-being.  Home and relationship well-being.  Sexual activity.  Eating habits.  History of falls.  Memory and ability to understand (cognition).  Work and work Statistician.  Reproductive health. Screening  You may have the following tests or measurements:  Height, weight, and BMI.  Blood pressure.  Lipid and cholesterol levels. These may be checked every 5 years, or more frequently if you are over 56 years old.  Skin check.  Lung cancer screening. You may have this screening every year starting at age 39 if you have a 30-pack-year history of smoking and currently smoke or have quit within the past 15 years.  Fecal occult blood test (FOBT) of the stool. You may have this test every year starting at age 4.  Flexible sigmoidoscopy or colonoscopy. You may have a sigmoidoscopy every 5 years or  a colonoscopy every 10 years starting at age 26.  Hepatitis C blood test.  Hepatitis B blood test.  Sexually transmitted disease (STD) testing.  Diabetes screening. This is done by checking your blood sugar (glucose) after you have not eaten for a while (fasting). You may have this done every 1-3 years.  Bone density scan. This is done to screen for osteoporosis. You may have this done starting at age  38.  Mammogram. This may be done every 1-2 years. Talk to your health care provider about how often you should have regular mammograms. Talk with your health care provider about your test results, treatment options, and if necessary, the need for more tests. Vaccines  Your health care provider may recommend certain vaccines, such as:  Influenza vaccine. This is recommended every year.  Tetanus, diphtheria, and acellular pertussis (Tdap, Td) vaccine. You may need a Td booster every 10 years.  Zoster vaccine. You may need this after age 45.  Pneumococcal 13-valent conjugate (PCV13) vaccine. One dose is recommended after age 67.  Pneumococcal polysaccharide (PPSV23) vaccine. One dose is recommended after age 69. Talk to your health care provider about which screenings and vaccines you need and how often you need them. This information is not intended to replace advice given to you by your health care provider. Make sure you discuss any questions you have with your health care provider. Document Released: 05/14/2015 Document Revised: 01/05/2016 Document Reviewed: 02/16/2015 Elsevier Interactive Patient Education  2017 [employer] Prevention in the Home Falls can cause injuries. They can happen to people of all ages. There are many things you can do to make your home safe and to help prevent falls. What can I do on the outside of my home?  Regularly fix the edges of walkways and driveways and fix any cracks.  Remove anything that might make you trip as you walk through a door, such as a raised step or threshold.  Trim any bushes or trees on the path to your home.  Use bright outdoor lighting.  Clear any walking paths of anything that might make someone trip, such as rocks or tools.  Regularly check to see if handrails are loose or broken. Make sure that both sides of any steps have handrails.  Any raised decks and porches should have guardrails on the edges.  Have any  leaves, snow, or ice cleared regularly.  Use sand or salt on walking paths during winter.  Clean up any spills in your garage right away. This includes oil or grease spills. What can I do in the bathroom?  Use night lights.  Install grab bars by the toilet and in the tub and shower. Do not use towel bars as grab bars.  Use non-skid mats or decals in the tub or shower.  If you need to sit down in the shower, use a plastic, non-slip stool.  Keep the floor dry. Clean up any water that spills on the floor as soon as it happens.  Remove soap buildup in the tub or shower regularly.  Attach bath mats securely with double-sided non-slip rug tape.  Do not have throw rugs and other things on the floor that can make you trip. What can I do in the bedroom?  Use night lights.  Make sure that you have a light by your bed that is easy to reach.  Do not use any sheets or blankets that are too big for your bed. They should not hang down onto  the floor.  Have a firm chair that has side arms. You can use this for support while you get dressed.  Do not have throw rugs and other things on the floor that can make you trip. What can I do in the kitchen?  Clean up any spills right away.  Avoid walking on wet floors.  Keep items that you use a lot in easy-to-reach places.  If you need to reach something above you, use a strong step stool that has a grab bar.  Keep electrical cords out of the way.  Do not use floor polish or wax that makes floors slippery. If you must use wax, use non-skid floor wax.  Do not have throw rugs and other things on the floor that can make you trip. What can I do with my stairs?  Do not leave any items on the stairs.  Make sure that there are handrails on both sides of the stairs and use them. Fix handrails that are broken or loose. Make sure that handrails are as long as the stairways.  Check any carpeting to make sure that it is firmly attached to the stairs.  Fix any carpet that is loose or worn.  Avoid having throw rugs at the top or bottom of the stairs. If you do have throw rugs, attach them to the floor with carpet tape.  Make sure that you have a light switch at the top of the stairs and the bottom of the stairs. If you do not have them, ask someone to add them for you. What else can I do to help prevent falls?  Wear shoes that:  Do not have high heels.  Have rubber bottoms.  Are comfortable and fit you well.  Are closed at the toe. Do not wear sandals.  If you use a stepladder:  Make sure that it is fully opened. Do not climb a closed stepladder.  Make sure that both sides of the stepladder are locked into place.  Ask someone to hold it for you, if possible.  Clearly mark and make sure that you can see:  Any grab bars or handrails.  First and last steps.  Where the edge of each step is.  Use tools that help you move around (mobility aids) if they are needed. These include:  Canes.  Walkers.  Scooters.  Crutches.  Turn on the lights when you go into a dark area. Replace any light bulbs as soon as they burn out.  Set up your furniture so you have a clear path. Avoid moving your furniture around.  If any of your floors are uneven, fix them.  If there are any pets around you, be aware of where they are.  Review your medicines with your doctor. Some medicines can make you feel dizzy. This can increase your chance of falling. Ask your doctor what other things that you can do to help prevent falls. This information is not intended to replace advice given to you by your health care provider. Make sure you discuss any questions you have with your health care provider. Document Released: 02/11/2009 Document Revised: 09/23/2015 Document Reviewed: 05/22/2014 Elsevier Interactive Patient Education  2017 [employer].

## 2020-09-17 NOTE — Addendum Note (Signed)
Addended by: Dia Crawford on: 09/17/2020 01:18 PM   Modules accepted: Orders

## 2020-09-20 ENCOUNTER — Telehealth: Payer: Self-pay

## 2020-09-20 NOTE — Chronic Care Management (AMB) (Signed)
  Chronic Care Management   Note  09/20/2020 Name: AIVAH PUTMAN MRN: [id number] DOB: [birth year]  AMERIA SANJURJO is a 69 y.o. year old female who is a primary care patient of [name], [name], MD. I reached out to Theodis Sato by phone today in response to a referral sent by Ms. Benancio Deeds Dory's patient's AWV (annual wellness visit) nurse, O'Brien-Blaney, Denisa L, LPN.      Ms. Curiale was given information about Chronic Care Management services today including:  1. CCM service includes personalized support from designated clinical staff supervised by her physician, including individualized plan of care and coordination with other care providers 2. 24/7 contact phone numbers for assistance for urgent and routine care needs. 3. Service will only be billed when office clinical staff spend 20 minutes or more in a month to coordinate care. 4. Only one practitioner may furnish and bill the service in a calendar month. 5. The patient may stop CCM services at any time (effective at the end of the month) by phone call to the office staff. 6. The patient will be responsible for cost sharing (co-pay) of up to 20% of the service fee (after annual deductible is met).  Patient agreed to services and verbal consent obtained.   Follow up plan: Telephone appointment with care management team member scheduled for:09/30/2020  Noreene Larsson, [place], [place], [place] [address] Direct Dial: [phone] [email or web address]: [email or web address]

## 2020-09-30 ENCOUNTER — Ambulatory Visit: Payer: Medicare Other | Admitting: Pharmacist

## 2020-09-30 DIAGNOSIS — E119 Type 2 diabetes mellitus without complications: Secondary | ICD-10-CM

## 2020-09-30 DIAGNOSIS — I10 Essential (primary) hypertension: Secondary | ICD-10-CM

## 2020-09-30 NOTE — Chronic Care Management (AMB) (Signed)
  Chronic Care Management   Note  09/30/2020 Name: Whitney Knight MRN: [id number] DOB: 1951/01/30  Contacted patient for CCM visit. She previously requested to discontinue CCM services due to a copay. She would like to confirm that she would not have a copay for CCM services under her current insurance plan prior to engaging with me. She notes the only thing she wanted to talk to me about was removing metformin from her medication list.   Will collaborate with CCM program leadership to investigate copay concerns and will report back to patient about this. Appears she had a different combination of insurance plans in 2021 when she was charged a CCM copay, but will see if I can confirm that.   Will notify PCP that patient stopped metformin and "feels better without it". Reports improvement in fatigue.   Catie Darnelle Maffucci, PharmD, [place], [place] Clinical Pharmacist [employer] [facility] [place]

## 2020-10-05 ENCOUNTER — Other Ambulatory Visit: Payer: Self-pay | Admitting: Family Medicine

## 2020-10-05 DIAGNOSIS — Z794 Long term (current) use of insulin: Secondary | ICD-10-CM | POA: Diagnosis not present

## 2020-10-05 DIAGNOSIS — E119 Type 2 diabetes mellitus without complications: Secondary | ICD-10-CM | POA: Diagnosis not present

## 2020-10-11 ENCOUNTER — Other Ambulatory Visit: Payer: Self-pay | Admitting: Family Medicine

## 2020-10-11 ENCOUNTER — Ambulatory Visit (INDEPENDENT_AMBULATORY_CARE_PROVIDER_SITE_OTHER): Payer: Medicare Other | Admitting: Pharmacist

## 2020-10-11 DIAGNOSIS — E119 Type 2 diabetes mellitus without complications: Secondary | ICD-10-CM | POA: Diagnosis not present

## 2020-10-11 DIAGNOSIS — E1165 Type 2 diabetes mellitus with hyperglycemia: Secondary | ICD-10-CM | POA: Diagnosis not present

## 2020-10-11 DIAGNOSIS — E782 Mixed hyperlipidemia: Secondary | ICD-10-CM

## 2020-10-11 DIAGNOSIS — I10 Essential (primary) hypertension: Secondary | ICD-10-CM

## 2020-10-11 NOTE — Patient Instructions (Signed)
Visit Information   PATIENT GOALS:   Goals Addressed               This Visit's Progress     Disease Progression Prevention (pt-stated)        Patient Goals/Self-Care Activities Over the next 90 days, patient will:  - take medications as prescribed check glucose twice daily, document, and provide at future appointments check blood pressure daily, document, and provide at future appointments          Consent to CCM Services: Whitney Knight was given information about Chronic Care Management services today including:  CCM service includes personalized support from designated clinical staff supervised by her physician, including individualized plan of care and coordination with other care providers 24/7 contact phone numbers for assistance for urgent and routine care needs. Service will only be billed when office clinical staff spend 20 minutes or more in a month to coordinate care. Only one practitioner may furnish and bill the service in a calendar month. The patient may stop CCM services at any time (effective at the end of the month) by phone call to the office staff. The patient will be responsible for cost sharing (co-pay) of up to 20% of the service fee (after annual deductible is met).  Patient agreed to services and verbal consent obtained.   Patient verbalizes understanding of instructions provided today and agrees to view in [place].   Telephone follow up appointment with care management team member scheduled for: ~4 months  Whitney Knight, PharmD, BCPS PGY2 Ambulatory Care Resident [place] CARE PLAN: Patient Care Plan: Medication Management     Problem Identified: T2DM, HTN, HLD, Hypothyroidism, GERD   Priority: High     Long-Range Goal: Patient Stated   Start Date: 10/11/2020  This Visit's Progress: On track  Priority: High  Note:   Current Barriers:  Unable to achieve control of diabetes   Pharmacist Clinical Goal(s):  Over the  next 90 days, patient will achieve control of diabetes as evidenced by A1c <7%  through collaboration with PharmD and provider.   Interventions: 1:1 collaboration with Leone Haven, MD regarding development and update of comprehensive plan of care as evidenced by provider attestation and co-signature Inter-disciplinary care team collaboration (see longitudinal plan of care) Comprehensive medication review performed; medication list updated in electronic medical record   Diabetes: Uncontrolled; current treatment:Victoza 1.8 mg daily, glimepiride 2 mg daily (taking PRN or 0.5 tablet), metformin 1000 mg BID (not taking)  Hx of Jardiance - N/V Hx of Ozempic - constipation Reports self-discontinuing metformin due to feeling "foggy," tried reducing to 0.5 tablet however symptoms did not resolve. Since self-discontinuing metformin, pt reports "feeling much better now" Reports skipping glimepiride or reducing to 0.5 tablet daily to prevent hypoglycemia  Denies GI issues with Victoza Current glucose readings: fasting glucose: 99 - 185 (BG>140 does not occur often), post prandial glucose: 155-210 Reports hypoglycemic symptoms - two low episodes ~3 weeks ago and self reduced glimepiride to either 0.5 tablet PRN and hypoglycemia has since resolved Weight loss: Baseline line weight 174.8 lbs; current weight 164.8 lbs (has lost 10 lbs); goal is to lose additional ~10-15 lbs  Current meal patterns: [employer] diet - eats every 2-3 hours; breakfast: coffee; lunch/dinner: lean meat and greens (salads, grilled chickn, hamburger pattie, green beans); snacks: 1 snack per day ; drinks: water with MiO lemonade; has limited diet drinks to 2/day (diet mountain dew caffeine-free) Current exercise: Walking 1.5 mile  daily  Counseled to continue to monitor BG at least twice daily (fasting and 2-hours after largest meal) Will discontinue metformin due to pt reported intolerance, and continue Victoza 1.8 mg daily and  glimepiride 2 mg daily/PRN  Hypertension: Controlled per last clinic readings; current treatment: amlodipine 5 mg daily, HCTZ 12.5 mg daily Intolerances: losartan - dry hacking cough  Current home readings: not checking Denies hypotensive symptoms Recommended to continue current regimen  Hyperlipidemia: Uncontrolled; current treatment: OTC fish oil 500 mg daily;  Medications previously tried: none Discussed clinical benefits of statin therapy in patients with history of diabetes, however patient remains resistant to statin therapy  Hypothyroidism Controlled; current treatment: levothyroxine 75 mcg daily Recommended to continue current regimen  Urinary Frequency Symptoms Controlled; current treatment: oxybutynin XL 5 mg daily Recommended to continue current regimen  Osteoporosis Controlled; current treatment: Zoledronic (Reclast) IV once yearly Recommended to continue current regimen  Allergic Rhinitis Moderately controlled; current treatment: cetirizine 10 mg daily, fluticasone 50 mcg nasal spray daily Recommended to continue current regimen  GERD Controlled: current treatment: omeprazole 40 mg daily, famotidine 10 mg daily PRN, TUMS PRN Recommended to continue current regimen  Supplements/OTC Current treatment: Vitamin D, Casanthranol-Docusate Sodium 30-100 mg daily, magnesium, melatonin, multivitamin, OTC fish oil, oxybutynin 5 mg tablet daily, potassium, probiotic,   Patient Goals/Self-Care Activities Over the next 90 days, patient will:  - take medications as prescribed check glucose twice daily, document, and provide at future appointments check blood pressure daily, document, and provide at future appointments  Follow Up Plan: Telephone follow up appointment with care management team member scheduled for: ~4 months
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
## 2020-10-15 DIAGNOSIS — M1711 Unilateral primary osteoarthritis, right knee: Secondary | ICD-10-CM | POA: Diagnosis not present

## 2020-10-27 ENCOUNTER — Other Ambulatory Visit: Payer: Self-pay | Admitting: Family Medicine

## 2020-10-27 MED ORDER — HYDROCHLOROTHIAZIDE 12.5 MG PO CAPS: 12.5000 mg | ORAL_CAPSULE | Freq: Every day | ORAL | 0 refills | Status: DC

## 2020-11-06 ENCOUNTER — Encounter: Payer: Self-pay | Admitting: Family Medicine

## 2020-11-06 DIAGNOSIS — E1165 Type 2 diabetes mellitus with hyperglycemia: Secondary | ICD-10-CM

## 2020-11-08 MED ORDER — GLIMEPIRIDE 2 MG PO TABS: ORAL_TABLET | ORAL | 1 refills | Status: DC

## 2020-12-08 ENCOUNTER — Other Ambulatory Visit: Payer: Self-pay | Admitting: Family Medicine

## 2020-12-25 ENCOUNTER — Encounter: Payer: Self-pay | Admitting: Family Medicine

## 2020-12-29 MED ORDER — DEXCOM G6 SENSOR MISC: 3 refills | Status: DC

## 2020-12-29 MED ORDER — DEXCOM G6 TRANSMITTER MISC: 0 refills | Status: DC

## 2020-12-29 MED ORDER — DEXCOM G6 RECEIVER DEVI: 0 refills | Status: DC

## 2021-01-01 ENCOUNTER — Other Ambulatory Visit: Payer: Self-pay | Admitting: Internal Medicine

## 2021-01-03 DIAGNOSIS — S0181XA Laceration without foreign body of other part of head, initial encounter: Secondary | ICD-10-CM | POA: Diagnosis not present

## 2021-01-06 ENCOUNTER — Telehealth: Payer: Self-pay

## 2021-01-06 NOTE — Telephone Encounter (Signed)
Prior authorization for Dexcom G6 transmitter has been submitted to covermymeds.   Key: [id number]

## 2021-01-21 ENCOUNTER — Other Ambulatory Visit: Payer: Self-pay | Admitting: Family Medicine

## 2021-01-25 ENCOUNTER — Ambulatory Visit: Payer: Medicare Other | Admitting: Pharmacist

## 2021-01-25 DIAGNOSIS — E1165 Type 2 diabetes mellitus with hyperglycemia: Secondary | ICD-10-CM

## 2021-01-25 DIAGNOSIS — I10 Essential (primary) hypertension: Secondary | ICD-10-CM

## 2021-01-25 DIAGNOSIS — E782 Mixed hyperlipidemia: Secondary | ICD-10-CM

## 2021-01-25 NOTE — Patient Instructions (Signed)
Visit Information  PATIENT GOALS:  Goals Addressed               This Visit's Progress     Patient Stated     Disease Progression Prevention (pt-stated)        Patient Goals/Self-Care Activities Over the next 90 days, patient will:  - take medications as prescribed check glucose twice daily, document, and provide at future appointments check blood pressure daily, document, and provide at future appointments        Patient verbalizes understanding of instructions provided today and agrees to view in [place].   Plan: Telephone follow up appointment with care management team member scheduled for:  4 weeks  Catie Darnelle Maffucci, PharmD, [place], [place] Clinical Pharmacist [employer] at [employer] [phone]
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
## 2021-01-27 LAB — HM DIABETES EYE EXAM

## 2021-02-01 DIAGNOSIS — Z1231 Encounter for screening mammogram for malignant neoplasm of breast: Secondary | ICD-10-CM | POA: Diagnosis not present

## 2021-02-01 LAB — HM MAMMOGRAPHY

## 2021-02-02 ENCOUNTER — Other Ambulatory Visit: Payer: Self-pay | Admitting: Obstetrics and Gynecology

## 2021-02-02 DIAGNOSIS — Z8249 Family history of ischemic heart disease and other diseases of the circulatory system: Secondary | ICD-10-CM

## 2021-02-04 DIAGNOSIS — E1165 Type 2 diabetes mellitus with hyperglycemia: Secondary | ICD-10-CM | POA: Diagnosis not present

## 2021-02-08 ENCOUNTER — Other Ambulatory Visit (HOSPITAL_COMMUNITY): Payer: Self-pay | Admitting: Obstetrics and Gynecology

## 2021-02-10 ENCOUNTER — Ambulatory Visit (INDEPENDENT_AMBULATORY_CARE_PROVIDER_SITE_OTHER): Payer: Medicare Other | Admitting: Pharmacist

## 2021-02-10 DIAGNOSIS — E1165 Type 2 diabetes mellitus with hyperglycemia: Secondary | ICD-10-CM

## 2021-02-10 DIAGNOSIS — E782 Mixed hyperlipidemia: Secondary | ICD-10-CM

## 2021-02-10 DIAGNOSIS — I10 Essential (primary) hypertension: Secondary | ICD-10-CM

## 2021-02-10 NOTE — Patient Instructions (Signed)
Visit Information  PATIENT GOALS:  Goals Addressed               This Visit's Progress     Patient Stated     Disease Progression Prevention (pt-stated)        Patient Goals/Self-Care Activities Over the next 90 days, patient will:  - take medications as prescribed check glucose twice daily, document, and provide at future appointments check blood pressure daily, document, and provide at future appointments         Patient verbalizes understanding of instructions provided today and agrees to view in [place].   Plan: Patient will outreach if need to schedule follow up moving forward  Catie Darnelle Maffucci, PharmD, [place], CPP [employer] at [facility]
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
## 2021-02-15 ENCOUNTER — Other Ambulatory Visit: Payer: Self-pay | Admitting: Family Medicine

## 2021-02-24 ENCOUNTER — Ambulatory Visit
Admission: RE | Admit: 2021-02-24 | Discharge: 2021-02-24 | Disposition: A | Discharge status: Home / Self Care | Payer: Medicare Other | Source: Ambulatory Visit | Attending: Obstetrics and Gynecology | Admitting: Obstetrics and Gynecology

## 2021-02-24 ENCOUNTER — Other Ambulatory Visit: Payer: Self-pay

## 2021-02-24 DIAGNOSIS — Z8249 Family history of ischemic heart disease and other diseases of the circulatory system: Secondary | ICD-10-CM | POA: Insufficient documentation

## 2021-02-26 ENCOUNTER — Other Ambulatory Visit: Payer: Self-pay | Admitting: Family Medicine

## 2021-02-28 DIAGNOSIS — E782 Mixed hyperlipidemia: Secondary | ICD-10-CM

## 2021-02-28 DIAGNOSIS — I10 Essential (primary) hypertension: Secondary | ICD-10-CM | POA: Diagnosis not present

## 2021-02-28 DIAGNOSIS — E1165 Type 2 diabetes mellitus with hyperglycemia: Secondary | ICD-10-CM

## 2021-03-02 ENCOUNTER — Telehealth: Payer: Self-pay | Admitting: Pharmacist

## 2021-03-02 NOTE — Telephone Encounter (Signed)
Patient identified as failing Statin Use In Diabetes gap due to diagnosis of diabetes and no statin therapy and no contraindication for statin therapy.   Patient recently had coronary artery CT scoring. Results noted by Dr. Garen Lah suggest statin therapy for primary prevention. This is also supported by her diagnosis of diabetes. Patient had previously told me that she would be interested to a referral to Dr. Fletcher Anon if results suggested need for treatment.  Contacted patient. She notes that she started taking red yeast rice and declines referral at this time, plans to discuss with Dr. [name] at upcoming appointment in DEcember and request updated lipid panel at that time.   Routing to PCP for FYI. Consider statin therapy at that time, regardless of impact of red yeast rice on lipids given lack of cardiovascular data with this therapy.

## 2021-03-07 DIAGNOSIS — E1165 Type 2 diabetes mellitus with hyperglycemia: Secondary | ICD-10-CM | POA: Diagnosis not present

## 2021-03-16 ENCOUNTER — Encounter: Payer: Self-pay | Admitting: Family Medicine

## 2021-03-16 DIAGNOSIS — E119 Type 2 diabetes mellitus without complications: Secondary | ICD-10-CM

## 2021-03-16 MED ORDER — ONETOUCH ULTRA VI STRP: ORAL_STRIP | 1 refills | Status: DC

## 2021-03-19 ENCOUNTER — Encounter: Payer: Self-pay | Admitting: Family Medicine

## 2021-03-21 ENCOUNTER — Other Ambulatory Visit: Payer: Self-pay | Admitting: Family Medicine

## 2021-03-21 DIAGNOSIS — E1165 Type 2 diabetes mellitus with hyperglycemia: Secondary | ICD-10-CM

## 2021-03-21 MED ORDER — VICTOZA 18 MG/3ML ~~LOC~~ SOPN: 1.2000 mg | PEN_INJECTOR | Freq: Every day | SUBCUTANEOUS | 1 refills | Status: DC

## 2021-03-31 ENCOUNTER — Other Ambulatory Visit: Payer: Self-pay | Admitting: Family Medicine

## 2021-04-04 ENCOUNTER — Encounter: Payer: Self-pay | Admitting: Family Medicine

## 2021-04-04 ENCOUNTER — Ambulatory Visit (INDEPENDENT_AMBULATORY_CARE_PROVIDER_SITE_OTHER): Payer: Medicare Other | Admitting: Family Medicine

## 2021-04-04 ENCOUNTER — Other Ambulatory Visit: Payer: Self-pay

## 2021-04-04 ENCOUNTER — Other Ambulatory Visit: Payer: Self-pay | Admitting: Family Medicine

## 2021-04-04 VITALS — BP 120/80 | HR 87 | Temp 98.2°F | Ht 63.0 in | Wt 165.6 lb

## 2021-04-04 DIAGNOSIS — I10 Essential (primary) hypertension: Secondary | ICD-10-CM | POA: Diagnosis not present

## 2021-04-04 DIAGNOSIS — W19XXXA Unspecified fall, initial encounter: Secondary | ICD-10-CM | POA: Insufficient documentation

## 2021-04-04 DIAGNOSIS — E1165 Type 2 diabetes mellitus with hyperglycemia: Secondary | ICD-10-CM | POA: Diagnosis not present

## 2021-04-04 DIAGNOSIS — E782 Mixed hyperlipidemia: Secondary | ICD-10-CM

## 2021-04-04 DIAGNOSIS — N179 Acute kidney failure, unspecified: Secondary | ICD-10-CM | POA: Diagnosis not present

## 2021-04-04 MED ORDER — HYDROCHLOROTHIAZIDE 12.5 MG PO CAPS: ORAL_CAPSULE | ORAL | 3 refills | Status: DC

## 2021-04-04 MED ORDER — FAMOTIDINE 10 MG PO TABS: 10.0000 mg | ORAL_TABLET | Freq: Every evening | ORAL | 1 refills | Status: DC | PRN

## 2021-04-04 MED ORDER — DEXCOM G6 RECEIVER DEVI
0 refills | Status: DC
  Filled 2021-04-04: qty 1, 90d supply, fill #0

## 2021-04-04 MED ORDER — DEXCOM G6 TRANSMITTER MISC
0 refills | Status: DC
  Filled 2021-04-04: qty 1, 90d supply, fill #0

## 2021-04-04 MED ORDER — OMEPRAZOLE 40 MG PO CPDR: 40.0000 mg | DELAYED_RELEASE_CAPSULE | Freq: Every day | ORAL | 2 refills | Status: DC

## 2021-04-04 MED ORDER — LEVOTHYROXINE SODIUM 75 MCG PO TABS: 75.0000 ug | ORAL_TABLET | Freq: Every day | ORAL | 1 refills | Status: DC

## 2021-04-04 MED ORDER — VICTOZA 18 MG/3ML ~~LOC~~ SOPN: 1.2000 mg | PEN_INJECTOR | Freq: Every day | SUBCUTANEOUS | 1 refills | Status: DC

## 2021-04-04 NOTE — Assessment & Plan Note (Signed)
Well-controlled.  She will continue amlodipine 5 mg once daily and hydrochlorothiazide 12.5 mg twice daily.  Check lab work.

## 2021-04-04 NOTE — Patient Instructions (Addendum)
Nice to see you. We will get lab work today. Please consider starting on a statin to help reduce your risk of stroke and heart attack. You are due for a mammogram.  This will be ordered for you.

## 2021-04-04 NOTE — Progress Notes (Signed)
Tommi Rumps, MD Phone: [phone]  Whitney Knight is a 70 y.o. female who presents today for f/u.  DIABETES Disease Monitoring: Blood Sugar ranges-60-300 Polyuria/phagia/dipsia- no      Optho- UTD Medications: Compliance- taking glimeperide, victoza Hypoglycemic symptoms- down to 60 when she took a whole glimeperide She feels great off of the metformin.  HYPERTENSION Disease Monitoring Home BP Monitoring similar to today Chest pain- no    Dyspnea- no Medications Compliance-  taking amlodipine, HCTZ.  Edema- no BMET    Component Value Date/Time   NA 137 03/24/2020 1129   K 3.9 03/24/2020 1129   CL 97 03/24/2020 1129   CO2 27 03/24/2020 1129   GLUCOSE 139 (H) 03/24/2020 1129   BUN 20 03/24/2020 1129   CREATININE 0.88 03/24/2020 1129   CALCIUM 9.8 03/24/2020 1129   GFRNONAA 68 03/24/2020 1129   GFRAA 79 03/24/2020 1129   Coronary calcium score: Patient had this completed through GYN.  She was in the 64th percentile for age and sex matched control.  The imaging report recommended to consider statin therapy given her age.  She has been on red yeast rice instead at this time.  Fall: Patient was tripped by some dogs around Labor Day and fell and hit her head.  She went to urgent care and had a Steri-Strip placed.  She notes she is doing well now.  She had no loss of consciousness.   Social History   Tobacco Use  Smoking Status Never  Smokeless Tobacco Never    Current Outpatient Medications on File Prior to Visit  Medication Sig Dispense Refill   amLODipine (NORVASC) 5 MG tablet TAKE 1 TABLET BY MOUTH DAILY. 90 tablet 3   Betamethasone Valerate 0.12 % foam Apply to affected area once daily for up to 7 days 100 g 0   blood glucose meter kit and supplies KIT Dispense based on patient and insurance preference. Use once daily as directed. (FOR ICD-10 E11.9). 1 each 0   calcium carbonate (TUMS EX) 750 MG chewable tablet Chew by mouth as needed.     Casanthranol-Docusate  Sodium 30-100 MG CAPS Take 1 tablet by mouth daily. 90 each 3   cetirizine (ZYRTEC) 10 MG tablet Take 10 mg by mouth daily.     cholecalciferol (VITAMIN D) 1000 UNITS tablet Take 1,000 Units by mouth daily.      Continuous Blood Gluc Receiver (DEXCOM G6 RECEIVER) DEVI Use to check glucose at least every 8 hours.  Diagnosis code E11.65. 1 each 0   Continuous Blood Gluc Sensor (DEXCOM G6 SENSOR) MISC Apply once every 10 days.  Diagnosis code E11.65. 3 each 3   Continuous Blood Gluc Transmit (DEXCOM G6 TRANSMITTER) MISC Use to check glucose at least every 8 hours.  Diagnosis code E11.65. 1 each 0   famotidine (PEPCID) 10 MG tablet Take 10 mg by mouth at bedtime as needed for heartburn or indigestion.     fluticasone (FLONASE) 50 MCG/ACT nasal spray USE TWO SPRAYS IN EACH NOSTRIL DAILY 16 g 1   glimepiride (AMARYL) 2 MG tablet TAKE 1 TABLET BY MOUTH ONCE DAILY WITH BREAKFAST 90 tablet 1   glucose blood (ONETOUCH ULTRA) test strip USE TO CHECK BLOOD SUGAR UP TO THREE TIMES DAILY 400 strip 1   Insulin Pen Needle (BD PEN NEEDLE NANO U/F) 32G X 4 MM MISC Inject daily as directed. 90 each 3   Lancets (ONETOUCH DELICA PLUS [id number]) MISC USE ONCE DAILY AS DIRECTED 100 each 1  Magnesium 250 MG TABS Take 1 tablet by mouth daily.     melatonin 5 MG TABS Take 5 mg by mouth at bedtime as needed.     Multiple Vitamins-Minerals (WOMENS MULTIVITAMIN PO) Take 1 tablet by mouth in the morning and at bedtime.      Omega-3 Fatty Acids (FISH OIL) 500 MG CAPS Take 500 tablets by mouth.     Potassium 99 MG TABS Take 1 tablet by mouth daily. OTC     Probiotic Product (PROBIOTIC DAILY PO) Take by mouth.     Zoledronic Acid (RECLAST IV) Inject into the vein. Once a year     No current facility-administered medications on file prior to visit.     ROS see history of present illness  Objective  Physical Exam Vitals:   04/04/21 0958  BP: 120/80  Pulse: 87  Temp: 98.2 F (36.8 C)  SpO2: 97%    BP Readings  from Last 3 Encounters:  04/04/21 120/80  09/17/20 117/75  07/07/20 130/70   Wt Readings from Last 3 Encounters:  04/04/21 165 lb 9.6 oz (75.1 kg)  09/17/20 167 lb 8 oz (76 kg)  07/07/20 174 lb (78.9 kg)    Physical Exam Constitutional:      General: She is not in acute distress.    Appearance: She is not diaphoretic.  Cardiovascular:     Rate and Rhythm: Normal rate and regular rhythm.     Heart sounds: Normal heart sounds.  Pulmonary:     Effort: Pulmonary effort is normal.     Breath sounds: Normal breath sounds.  Musculoskeletal:     Right lower leg: No edema.     Left lower leg: No edema.  Skin:    General: Skin is warm and dry.  Neurological:     Mental Status: She is alert.     Assessment/Plan: Please see individual problem list.  Problem List Items Addressed This Visit     Fall    The patient was tripped by several dogs.  Generally she has no issues with falling.  She has recovered quite well.  Encouraged her to be careful particularly when she is around dogs.      Hyperlipidemia    Discussed that the patient met criteria for statin particularly given her history of diabetes.  Discussed that a statin would help reduce her risk of stroke and heart attack.  She is hesitant to start on 1 of these given side effects that her husband has been having from his statin.  We will check a lipid panel today and consider sending him Crestor once the lipid panel returns.  I did discuss that there is not any data that shows red yeast rice to provide benefit with cardiovascular risk reduction.      Relevant Medications   hydrochlorothiazide (MICROZIDE) 12.5 MG capsule   Other Relevant Orders   Lipid panel   Hypertension    Well-controlled.  She will continue amlodipine 5 mg once daily and hydrochlorothiazide 12.5 mg twice daily.  Check lab work.      Relevant Medications   hydrochlorothiazide (MICROZIDE) 12.5 MG capsule   Other Relevant Orders   Comp Met (CMET)    Uncontrolled type 2 diabetes mellitus with hyperglycemia (HCC)    Check A1c.  She will continue glimepiride 2 mg once daily and Victoza 1.2 mg daily.  Check urine microalbumin as well.      Relevant Medications   liraglutide (VICTOZA) 18 MG/3ML SOPN   Other Relevant  Orders   Urine Microalbumin w/creat. ratio   HgB A1c     Health Maintenance: requesting mammogram result. Patient reports she completed this recently through GYN.   Return in about 3 months (around 07/03/2021) for Diabetes/hyperlipidemia.  This visit occurred during the SARS-CoV-2 public health emergency.  Safety protocols were in place, including screening questions prior to the visit, additional usage of staff PPE, and extensive cleaning of exam room while observing appropriate contact time as indicated for disinfecting solutions.    Tommi Rumps, MD [place]

## 2021-04-04 NOTE — Assessment & Plan Note (Signed)
Check A1c.  She will continue glimepiride 2 mg once daily and Victoza 1.2 mg daily.  Check urine microalbumin as well.

## 2021-04-04 NOTE — Assessment & Plan Note (Addendum)
Discussed that the patient met criteria for statin particularly given her history of diabetes.  Discussed that a statin would help reduce her risk of stroke and heart attack.  She is hesitant to start on 1 of these given side effects that her husband has been having from his statin.  We will check a lipid panel today and consider sending him Crestor once the lipid panel returns.  I did discuss that there is not any data that shows red yeast rice to provide benefit with cardiovascular risk reduction.

## 2021-04-04 NOTE — Assessment & Plan Note (Signed)
The patient was tripped by several dogs.  Generally she has no issues with falling.  She has recovered quite well.  Encouraged her to be careful particularly when she is around dogs.

## 2021-04-05 ENCOUNTER — Other Ambulatory Visit: Payer: Self-pay

## 2021-04-05 LAB — COMPREHENSIVE METABOLIC PANEL
ALT: 31 IU/L (ref 0–32)
AST: 33 IU/L (ref 0–40)
Albumin/Globulin Ratio: 1.5 (ref 1.2–2.2)
Albumin: 4.2 g/dL (ref 3.8–4.8)
Alkaline Phosphatase: 133 IU/L — ABNORMAL HIGH (ref 44–121)
BUN/Creatinine Ratio: 18 (ref 12–28)
BUN: 19 mg/dL (ref 8–27)
Bilirubin Total: 0.4 mg/dL (ref 0.0–1.2)
CO2: 27 mmol/L (ref 20–29)
Calcium: 9.4 mg/dL (ref 8.7–10.3)
Chloride: 98 mmol/L (ref 96–106)
Creatinine, Ser: 1.03 mg/dL — ABNORMAL HIGH (ref 0.57–1.00)
Globulin, Total: 2.8 g/dL (ref 1.5–4.5)
Glucose: 143 mg/dL — ABNORMAL HIGH (ref 70–99)
Potassium: 3.9 mmol/L (ref 3.5–5.2)
Sodium: 139 mmol/L (ref 134–144)
Total Protein: 7 g/dL (ref 6.0–8.5)
eGFR: 59 mL/min/{1.73_m2} — ABNORMAL LOW (ref >59)

## 2021-04-05 LAB — HEMOGLOBIN A1C
Est. average glucose Bld gHb Est-mCnc: 166 mg/dL
Hgb A1c MFr Bld: 7.4 % — ABNORMAL HIGH (ref 4.8–5.6)

## 2021-04-05 LAB — LIPID PANEL
Chol/HDL Ratio: 2.8 ratio (ref 0.0–4.4)
Cholesterol, Total: 202 mg/dL — ABNORMAL HIGH (ref 100–199)
HDL: 72 mg/dL (ref >39)
LDL Chol Calc (NIH): 113 mg/dL — ABNORMAL HIGH (ref 0–99)
Triglycerides: 95 mg/dL (ref 0–149)
VLDL Cholesterol Cal: 17 mg/dL (ref 5–40)

## 2021-04-06 ENCOUNTER — Other Ambulatory Visit: Payer: Self-pay

## 2021-04-06 LAB — MICROALBUMIN / CREATININE URINE RATIO
Creatinine, Urine: 39.3 mg/dL
Microalb/Creat Ratio: 14 mg/g creat (ref 0–29)
Microalbumin, Urine: 5.4 ug/mL

## 2021-04-07 DIAGNOSIS — E1165 Type 2 diabetes mellitus with hyperglycemia: Secondary | ICD-10-CM | POA: Diagnosis not present

## 2021-04-11 ENCOUNTER — Other Ambulatory Visit: Payer: Self-pay

## 2021-04-12 ENCOUNTER — Encounter: Payer: Self-pay | Admitting: Family Medicine

## 2021-04-12 NOTE — Addendum Note (Signed)
Addended by: Nanci Pina on: 04/12/2021 02:47 PM   Modules accepted: Orders

## 2021-04-13 ENCOUNTER — Other Ambulatory Visit: Payer: Self-pay | Admitting: Family Medicine

## 2021-04-13 MED ORDER — TIRZEPATIDE 5 MG/0.5ML ~~LOC~~ SOAJ: 5.0000 mg | SUBCUTANEOUS | 1 refills | Status: DC

## 2021-04-13 MED ORDER — TIRZEPATIDE 2.5 MG/0.5ML ~~LOC~~ SOAJ: 2.5000 mg | SUBCUTANEOUS | 0 refills | Status: DC

## 2021-04-15 ENCOUNTER — Other Ambulatory Visit: Payer: Self-pay | Admitting: Family Medicine

## 2021-04-18 ENCOUNTER — Encounter: Payer: Self-pay | Admitting: Family Medicine

## 2021-04-18 DIAGNOSIS — R748 Abnormal levels of other serum enzymes: Secondary | ICD-10-CM

## 2021-04-30 ENCOUNTER — Other Ambulatory Visit: Payer: Self-pay | Admitting: Family Medicine

## 2021-05-03 MED ORDER — TIRZEPATIDE 2.5 MG/0.5ML ~~LOC~~ SOAJ: 2.5000 mg | SUBCUTANEOUS | 0 refills | Status: DC

## 2021-05-04 ENCOUNTER — Other Ambulatory Visit: Payer: Self-pay

## 2021-05-04 ENCOUNTER — Other Ambulatory Visit (INDEPENDENT_AMBULATORY_CARE_PROVIDER_SITE_OTHER): Payer: Medicare Other

## 2021-05-04 DIAGNOSIS — R748 Abnormal levels of other serum enzymes: Secondary | ICD-10-CM | POA: Diagnosis not present

## 2021-05-04 DIAGNOSIS — N179 Acute kidney failure, unspecified: Secondary | ICD-10-CM | POA: Diagnosis not present

## 2021-05-04 NOTE — Addendum Note (Signed)
Addended by: Leeanne Rio on: 05/04/2021 10:17 AM   Modules accepted: Orders

## 2021-05-05 LAB — HEPATIC FUNCTION PANEL
ALT: 33 IU/L — ABNORMAL HIGH (ref 0–32)
AST: 42 IU/L — ABNORMAL HIGH (ref 0–40)
Albumin: 4.2 g/dL (ref 3.8–4.8)
Alkaline Phosphatase: 147 IU/L — ABNORMAL HIGH (ref 44–121)
Bilirubin Total: 0.4 mg/dL (ref 0.0–1.2)
Bilirubin, Direct: 0.12 mg/dL (ref 0.00–0.40)
Total Protein: 6.8 g/dL (ref 6.0–8.5)

## 2021-05-05 LAB — BASIC METABOLIC PANEL
BUN/Creatinine Ratio: 18 (ref 12–28)
BUN: 19 mg/dL (ref 8–27)
CO2: 28 mmol/L (ref 20–29)
Calcium: 9.2 mg/dL (ref 8.7–10.3)
Chloride: 97 mmol/L (ref 96–106)
Creatinine, Ser: 1.05 mg/dL — ABNORMAL HIGH (ref 0.57–1.00)
Glucose: 243 mg/dL — ABNORMAL HIGH (ref 70–99)
Potassium: 4.1 mmol/L (ref 3.5–5.2)
Sodium: 138 mmol/L (ref 134–144)
eGFR: 58 mL/min/{1.73_m2} — ABNORMAL LOW (ref >59)

## 2021-05-06 ENCOUNTER — Other Ambulatory Visit: Payer: Self-pay | Admitting: Family

## 2021-05-06 ENCOUNTER — Other Ambulatory Visit: Payer: Self-pay | Admitting: Family Medicine

## 2021-05-10 DIAGNOSIS — M810 Age-related osteoporosis without current pathological fracture: Secondary | ICD-10-CM | POA: Diagnosis not present

## 2021-05-11 DIAGNOSIS — E1165 Type 2 diabetes mellitus with hyperglycemia: Secondary | ICD-10-CM | POA: Diagnosis not present

## 2021-05-16 MED ORDER — TIRZEPATIDE 5 MG/0.5ML ~~LOC~~ SOAJ: 5.0000 mg | SUBCUTANEOUS | 1 refills | Status: DC

## 2021-05-18 ENCOUNTER — Encounter: Payer: Self-pay | Admitting: Family Medicine

## 2021-05-18 DIAGNOSIS — M65332 Trigger finger, left middle finger: Secondary | ICD-10-CM | POA: Diagnosis not present

## 2021-05-20 ENCOUNTER — Other Ambulatory Visit: Payer: Self-pay

## 2021-05-20 DIAGNOSIS — N179 Acute kidney failure, unspecified: Secondary | ICD-10-CM

## 2021-06-06 ENCOUNTER — Other Ambulatory Visit: Payer: Self-pay | Admitting: Family Medicine

## 2021-06-06 DIAGNOSIS — E119 Type 2 diabetes mellitus without complications: Secondary | ICD-10-CM

## 2021-06-11 DIAGNOSIS — E1165 Type 2 diabetes mellitus with hyperglycemia: Secondary | ICD-10-CM | POA: Diagnosis not present

## 2021-06-21 ENCOUNTER — Other Ambulatory Visit (INDEPENDENT_AMBULATORY_CARE_PROVIDER_SITE_OTHER): Payer: Medicare Other

## 2021-06-21 ENCOUNTER — Other Ambulatory Visit: Payer: Self-pay

## 2021-06-21 DIAGNOSIS — N179 Acute kidney failure, unspecified: Secondary | ICD-10-CM

## 2021-06-22 LAB — HEPATIC FUNCTION PANEL
ALT: 30 IU/L (ref 0–32)
AST: 37 IU/L (ref 0–40)
Albumin: 4.3 g/dL (ref 3.8–4.8)
Alkaline Phosphatase: 150 IU/L — ABNORMAL HIGH (ref 44–121)
Bilirubin Total: 0.5 mg/dL (ref 0.0–1.2)
Bilirubin, Direct: 0.15 mg/dL (ref 0.00–0.40)
Total Protein: 6.8 g/dL (ref 6.0–8.5)

## 2021-06-23 ENCOUNTER — Encounter: Payer: Self-pay | Admitting: Family Medicine

## 2021-06-23 DIAGNOSIS — R748 Abnormal levels of other serum enzymes: Secondary | ICD-10-CM

## 2021-06-24 NOTE — Telephone Encounter (Signed)
That's fine

## 2021-07-04 ENCOUNTER — Ambulatory Visit (INDEPENDENT_AMBULATORY_CARE_PROVIDER_SITE_OTHER): Payer: Medicare Other | Admitting: Family Medicine

## 2021-07-04 ENCOUNTER — Encounter: Payer: Self-pay | Admitting: Family Medicine

## 2021-07-04 ENCOUNTER — Other Ambulatory Visit: Payer: Self-pay

## 2021-07-04 VITALS — BP 138/82 | HR 84 | Temp 98.3°F | Ht 63.0 in | Wt 170.0 lb

## 2021-07-04 DIAGNOSIS — E559 Vitamin D deficiency, unspecified: Secondary | ICD-10-CM

## 2021-07-04 DIAGNOSIS — E782 Mixed hyperlipidemia: Secondary | ICD-10-CM | POA: Diagnosis not present

## 2021-07-04 DIAGNOSIS — I10 Essential (primary) hypertension: Secondary | ICD-10-CM

## 2021-07-04 DIAGNOSIS — R748 Abnormal levels of other serum enzymes: Secondary | ICD-10-CM | POA: Diagnosis not present

## 2021-07-04 DIAGNOSIS — E1165 Type 2 diabetes mellitus with hyperglycemia: Secondary | ICD-10-CM | POA: Diagnosis not present

## 2021-07-04 HISTORY — DX: Abnormal levels of other serum enzymes: R74.8

## 2021-07-04 MED ORDER — DOXYCYCLINE HYCLATE 100 MG PO TABS: 100.0000 mg | ORAL_TABLET | Freq: Two times a day (BID) | ORAL | 0 refills | Status: DC

## 2021-07-04 NOTE — Assessment & Plan Note (Signed)
Above goal.  Discussed a goal of less than 130/80.  Discussed increasing her medication versus continue to work on diet and exercise.  Patient is hesitant to increase medication at this time and we will see what her blood pressure is at her next visit. ?

## 2021-07-04 NOTE — Assessment & Plan Note (Signed)
Check alkaline phosphatase isoenzymes. ?

## 2021-07-04 NOTE — Progress Notes (Signed)
?Tommi Rumps, MD ?Phone: [phone] ? ?Whitney Knight is a 71 y.o. female who presents today for f/u. ? ?DIABETES ?Disease Monitoring: ?Blood Sugar ranges-trending down, notes the dexcom has not revealed any excessive elevations Polyuria/phagia/dipsia- no      Optho- UTD ?Medications: ?Compliance- taking mounjaro, glimeperide Hypoglycemic symptoms- has gotten low occasionally  ? ?Hyperlipidemia: The patient is exercising by walking 2 to 3 miles a day.  She has a protein drink in the morning.  She has not Optavia var during the day.  She has 1 meal at night. ? ?Elevated alkaline phosphatase: Notes no abdominal pain. ? ?Hypertension: She is on amlodipine and chlorthalidone.  She notes her blood pressures typically in the 950D-326Z systolically. ? ? ?Social History  ? ?Tobacco Use  ?Smoking Status Never  ?Smokeless Tobacco Never  ? ? ?Current Outpatient Medications on File Prior to Visit  ?Medication Sig Dispense Refill  ? amLODipine (NORVASC) 5 MG tablet TAKE 1 TABLET BY MOUTH DAILY. 90 tablet 3  ? Betamethasone Valerate 0.12 % foam Apply to affected area once daily for up to 7 days 100 g 0  ? Casanthranol-Docusate Sodium 30-100 MG CAPS Take 1 tablet by mouth daily. 90 each 3  ? cetirizine (ZYRTEC) 10 MG tablet Take 10 mg by mouth daily.    ? cholecalciferol (VITAMIN D) 1000 UNITS tablet Take 1,000 Units by mouth daily.     ? Continuous Blood Gluc Receiver (DEXCOM G6 RECEIVER) DEVI Use to check glucose at least every 8 hours.  Diagnosis code E11.65. 1 each 0  ? Continuous Blood Gluc Sensor (DEXCOM G6 SENSOR) MISC Apply once every 10 days.  Diagnosis code E11.65. 3 each 3  ? Continuous Blood Gluc Transmit (DEXCOM G6 TRANSMITTER) MISC Use to check glucose at least every 8 hours 1 each 0  ? famotidine (PEPCID) 10 MG tablet Take 1 tablet (10 mg total) by mouth at bedtime as needed for heartburn or indigestion. 90 tablet 1  ? fluticasone (FLONASE) 50 MCG/ACT nasal spray USE TWO SPRAYS IN EACH NOSTRIL DAILY 16 g 1   ? glimepiride (AMARYL) 2 MG tablet TAKE 1 TABLET BY MOUTH ONCE DAILY WITH BREAKFAST 90 tablet 1  ? glucose blood (ONETOUCH ULTRA) test strip CHECK BLOOD SUGAR UP TO THREE TIMES DAILY 400 strip 1  ? hydrochlorothiazide (MICROZIDE) 12.5 MG capsule TAKE 1 CAPSULE BY MOUTH EVERY DAY 90 capsule 3  ? Insulin Pen Needle (BD PEN NEEDLE NANO U/F) 32G X 4 MM MISC Inject daily as directed. 90 each 3  ? Lancets (ONETOUCH DELICA PLUS [id number]) MISC USE ONCE DAILY AS DIRECTED 100 each 1  ? levothyroxine (SYNTHROID) 75 MCG tablet Take 1 tablet (75 mcg total) by mouth daily. 90 tablet 1  ? Magnesium 250 MG TABS Take 1 tablet by mouth daily.    ? melatonin 5 MG TABS Take 5 mg by mouth at bedtime as needed.    ? MOUNJARO 2.5 MG/0.5ML Pen INJECT 2.5MG INTO THE SKIN ONCE A WEEK AS DIRECTED 2 mL 0  ? Multiple Vitamins-Minerals (WOMENS MULTIVITAMIN PO) Take 1 tablet by mouth in the morning and at bedtime.     ? Omega-3 Fatty Acids (FISH OIL) 500 MG CAPS Take 500 tablets by mouth.    ? omeprazole (PRILOSEC) 40 MG capsule Take 1 capsule (40 mg total) by mouth daily. 90 capsule 2  ? Potassium 99 MG TABS Take 1 tablet by mouth daily. OTC    ? Probiotic Product (PROBIOTIC DAILY PO) Take by mouth.    ?  tirzepatide [facility] 5 MG/0.5ML Pen Inject 5 mg into the skin once a week. Start after completing 4 weeks of the 2.5 mg dose. 6 mL 1  ? Zoledronic Acid (RECLAST IV) Inject into the vein. Once a year    ? ?No current facility-administered medications on file prior to visit.  ? ? ? ?ROS see history of present illness ? ?Objective ? ?Physical Exam ?Vitals:  ? 07/04/21 1148 07/04/21 1215  ?BP: 140/80 138/82  ?Pulse: 85 84  ?Temp: 98.3 ?F (36.8 ?C)   ?SpO2: 99% 99%  ? ? ?BP Readings from Last 3 Encounters:  ?07/04/21 138/82  ?04/04/21 120/80  ?09/17/20 117/75  ? ?Wt Readings from Last 3 Encounters:  ?07/04/21 170 lb (77.1 kg)  ?04/04/21 165 lb 9.6 oz (75.1 kg)  ?09/17/20 167 lb 8 oz (76 kg)  ? ? ?Physical Exam ?Constitutional:   ?   General: She  is not in acute distress. ?   Appearance: She is not diaphoretic.  ?Cardiovascular:  ?   Rate and Rhythm: Normal rate and regular rhythm.  ?   Heart sounds: Normal heart sounds.  ?Pulmonary:  ?   Effort: Pulmonary effort is normal.  ?   Breath sounds: Normal breath sounds.  ?Musculoskeletal:  ?   Right lower leg: No edema.  ?   Left lower leg: No edema.  ?Skin: ?   General: Skin is warm and dry.  ?Neurological:  ?   Mental Status: She is alert.  ? ? ? ?Assessment/Plan: Please see individual problem list. ? ?Problem List Items Addressed This Visit   ? ? Alkaline phosphatase elevation  ?  Check alkaline phosphatase isoenzymes. ?  ?  ? Relevant Orders  ? Alkaline phosphatase, isoenzymes  ? Hyperlipidemia  ?  Discussed that she met criteria for a statin given her diabetes history.  Discussed this would help reduce the risk of stroke and heart attack.  The patient does not want a statin at this time.  She will let us know if she changes her mind. ?  ?  ? Hypertension  ?  Above goal.  Discussed a goal of less than 130/80.  Discussed increasing her medication versus continue to work on diet and exercise.  Patient is hesitant to increase medication at this time and we will see what her blood pressure is at her next visit. ?  ?  ? Uncontrolled type 2 diabetes mellitus with hyperglycemia (HCC) - Primary  ?  Patient reports her sugars have been trending down.  We will check an A1c today.  She will continue Mounjaro 5 mg weekly and glimepiride 2 mg once daily. ?  ?  ? Relevant Orders  ? HgB A1c  ? Vitamin D deficiency  ?  Most recent vitamin D through endocrinology was in the normal range. ?  ?  ?The patient wanted a prescription for doxycycline [place] her [place] in case she develops a sinus infection.  I counseled that if she developed sinus infection-like symptoms she would need to test herself for COVID.  Discussed she should only take doxycycline if she has had symptoms for 7 or more days.  Discussed if she became  significantly ill or if she was not improving with antibiotics she would need to seek medical attention while out of the country. ? ?Health maintenance: We are requesting mammogram results from her gynecologist. ? ?Return in about 3 months (around 10/04/2021). ? ?This visit occurred during the SARS-CoV-2 public health emergency.  Safety protocols  were in place, including screening questions prior to the visit, additional usage of staff PPE, and extensive cleaning of exam room while observing appropriate contact time as indicated for disinfecting solutions.  ? ? ?Tommi Rumps, MD ?[place] ? ?

## 2021-07-04 NOTE — Patient Instructions (Addendum)
Nice to see you. ?We will check lab work today and contact you with the results. ?Please continue to work on diet and exercise. ?I sent the doxycycline in.  You should only take this if you have had sinus symptoms for 7 or more days and are not improving.  If you become significantly ill or are not improving with the antibiotics you should seek medical attention while you are out of the country. ?

## 2021-07-04 NOTE — Assessment & Plan Note (Signed)
Most recent vitamin D through endocrinology was in the normal range. ?

## 2021-07-04 NOTE — Assessment & Plan Note (Signed)
Patient reports her sugars have been trending down.  We will check an A1c today.  She will continue Mounjaro 5 mg weekly and glimepiride 2 mg once daily.

## 2021-07-04 NOTE — Assessment & Plan Note (Signed)
Discussed that she met criteria for a statin given her diabetes history.  Discussed this would help reduce the risk of stroke and heart attack.  The patient does not want a statin at this time.  She will let us know if she changes her mind.

## 2021-07-06 ENCOUNTER — Ambulatory Visit: Payer: Self-pay | Admitting: Pharmacist

## 2021-07-06 NOTE — Chronic Care Management (AMB) (Signed)
?  Chronic Care Management  ? ?Note ? ?07/06/2021 ?Name: Whitney Knight MRN: [id number] DOB: July 28, 1950 ? ? ? ?Closing pharmacy CCM case at this time. W Patient has clinic contact information for future questions or concerns.  ? ?Catie Darnelle Maffucci, PharmD, [place], CPP ?Clinical Pharmacist ?Therapist, music at [employer] ?[phone] ? ?

## 2021-07-07 ENCOUNTER — Telehealth: Payer: Self-pay

## 2021-07-07 ENCOUNTER — Other Ambulatory Visit: Payer: Self-pay

## 2021-07-07 MED ORDER — TIRZEPATIDE 10 MG/0.5ML ~~LOC~~ SOAJ: 10.0000 mg | SUBCUTANEOUS | 1 refills | Status: DC

## 2021-07-07 NOTE — Telephone Encounter (Signed)
LMTCB for lab results.  

## 2021-07-08 LAB — ALKALINE PHOSPHATASE, ISOENZYMES
Alkaline Phosphatase: 143 IU/L — ABNORMAL HIGH (ref 44–121)
BONE FRACTION: 34 % (ref 14–68)
INTESTINAL FRAC.: 19 % — ABNORMAL HIGH (ref 0–18)
LIVER FRACTION: 47 % (ref 18–85)

## 2021-07-08 LAB — HEMOGLOBIN A1C
Est. average glucose Bld gHb Est-mCnc: 160 mg/dL
Hgb A1c MFr Bld: 7.2 % — ABNORMAL HIGH (ref 4.8–5.6)

## 2021-07-12 ENCOUNTER — Other Ambulatory Visit: Payer: Self-pay | Admitting: Family Medicine

## 2021-07-12 DIAGNOSIS — R748 Abnormal levels of other serum enzymes: Secondary | ICD-10-CM

## 2021-07-12 DIAGNOSIS — E1165 Type 2 diabetes mellitus with hyperglycemia: Secondary | ICD-10-CM | POA: Diagnosis not present

## 2021-07-21 ENCOUNTER — Other Ambulatory Visit: Payer: Self-pay | Admitting: Family Medicine

## 2021-08-12 DIAGNOSIS — E1165 Type 2 diabetes mellitus with hyperglycemia: Secondary | ICD-10-CM | POA: Diagnosis not present

## 2021-08-17 DIAGNOSIS — M810 Age-related osteoporosis without current pathological fracture: Secondary | ICD-10-CM | POA: Diagnosis not present

## 2021-08-23 ENCOUNTER — Encounter: Payer: Self-pay | Admitting: Internal Medicine

## 2021-08-23 ENCOUNTER — Other Ambulatory Visit: Payer: Self-pay | Admitting: Family Medicine

## 2021-09-02 ENCOUNTER — Other Ambulatory Visit: Payer: Self-pay | Admitting: Family Medicine

## 2021-09-02 DIAGNOSIS — E1165 Type 2 diabetes mellitus with hyperglycemia: Secondary | ICD-10-CM

## 2021-09-12 DIAGNOSIS — E1165 Type 2 diabetes mellitus with hyperglycemia: Secondary | ICD-10-CM | POA: Diagnosis not present

## 2021-09-13 ENCOUNTER — Ambulatory Visit: Payer: Medicare Other | Admitting: Internal Medicine

## 2021-09-21 ENCOUNTER — Ambulatory Visit (INDEPENDENT_AMBULATORY_CARE_PROVIDER_SITE_OTHER): Payer: Medicare Other

## 2021-09-21 VITALS — Ht 63.0 in | Wt 170.0 lb

## 2021-09-21 DIAGNOSIS — Z0000 Encounter for general adult medical examination without abnormal findings: Secondary | ICD-10-CM | POA: Diagnosis not present

## 2021-09-21 NOTE — Progress Notes (Signed)
Subjective:   Whitney Knight is a 71 y.o. female who presents for Medicare Annual (Subsequent) preventive examination.  Review of Systems    No ROS.  Medicare Wellness Virtual Visit.  Visual/audio telehealth visit, UTA vital signs.   See social history for additional risk factors.   Cardiac Risk Factors include: advanced age (>7mn, >>73women)     Objective:    Today's Vitals   09/21/21 1407  Weight: 170 lb (77.1 kg)  Height: '5\' 3"'$  (1.6 m)   Body mass index is 30.11 kg/m.     09/21/2021    2:11 PM 09/17/2020   10:11 AM 07/28/2019   10:51 AM 05/15/2017    3:56 PM 05/18/2015    9:28 AM  Advanced Directives  Does Patient Have a Medical Advance Directive? Yes Yes Yes No Yes  Type of [place]amed[place]of [place] will [place] will [place] will  [place]Living will  Does patient want to make changes to medical advance directive? No - Patient declined No - Patient declined No - Patient declined    Copy of HWingerin Chart? No - copy requested No - copy requested No - copy requested    Would patient like information on creating a medical advance directive?    No - Patient declined     Current Medications (verified) Outpatient Encounter Medications as of 09/21/2021  Medication Sig   amLODipine (NORVASC) 5 MG tablet TAKE 1 TABLET BY MOUTH DAILY.   Betamethasone Valerate 0.12 % foam Apply to affected area once daily for up to 7 days   Casanthranol-Docusate Sodium 30-100 MG CAPS Take 1 tablet by mouth daily.   cetirizine (ZYRTEC) 10 MG tablet Take 10 mg by mouth daily.   cholecalciferol (VITAMIN D) 1000 UNITS tablet Take 1,000 Units by mouth daily.    Continuous Blood Gluc Receiver (DEXCOM G6 RECEIVER) DEVI Use to check glucose at least every 8 hours.  Diagnosis code E11.65.   Continuous Blood Gluc Sensor (DEXCOM G6 SENSOR) MISC Apply once every 10 days.   Diagnosis code E11.65.   Continuous Blood Gluc Transmit (DEXCOM G6 TRANSMITTER) MISC Use to check glucose at least every 8 hours   doxycycline (VIBRA-TABS) 100 MG tablet Take 1 tablet (100 mg total) by mouth 2 (two) times daily.   famotidine (PEPCID) 10 MG tablet Take 1 tablet (10 mg total) by mouth at bedtime as needed for heartburn or indigestion.   fluticasone (FLONASE) 50 MCG/ACT nasal spray USE TWO SPRAYS IN EACH NOSTRIL DAILY   glimepiride (AMARYL) 2 MG tablet TAKE 1 TABLET BY MOUTH ONCE DAILY WITH BREAKFAST   glucose blood (ONETOUCH ULTRA) test strip CHECK BLOOD SUGAR UP TO THREE TIMES DAILY   hydrochlorothiazide (MICROZIDE) 12.5 MG capsule TAKE 1 CAPSULE BY MOUTH EVERY DAY   Insulin Pen Needle (BD PEN NEEDLE NANO U/F) 32G X 4 MM MISC Inject daily as directed.   Lancets (ONETOUCH DELICA PLUS [id number] MISC USE ONCE DAILY AS DIRECTED   levothyroxine (SYNTHROID) 75 MCG tablet Take 1 tablet (75 mcg total) by mouth daily.   Magnesium 250 MG TABS Take 1 tablet by mouth daily.   melatonin 5 MG TABS Take 5 mg by mouth at bedtime as needed.   Multiple Vitamins-Minerals (WOMENS MULTIVITAMIN PO) Take 1 tablet by mouth in the morning and at bedtime.    Omega-3 Fatty Acids (FISH OIL) 500 MG CAPS Take 500 tablets by mouth.   omeprazole (  PRILOSEC) 40 MG capsule Take 1 capsule (40 mg total) by mouth daily.   Potassium 99 MG TABS Take 1 tablet by mouth daily. OTC   Probiotic Product (PROBIOTIC DAILY PO) Take by mouth.   tirzepatide (MOUNJARO) 10 MG/0.5ML Pen Inject 10 mg into the skin once a week.   Zoledronic Acid (RECLAST IV) Inject into the vein. Once a year   No facility-administered encounter medications on file as of 09/21/2021.    Allergies (verified) Clarithromycin, Jardiance [empagliflozin], and Penicillins   History: Past Medical History:  Diagnosis Date   Allergy    Anemia    Arthritis    KNEES   Cataract    Chronic sinusitis    Cough    Diabetes mellitus    Diverticulosis     GERD (gastroesophageal reflux disease)    Headache(784.0)    [facility] in past   Hypertension    Osteoporosis    Thyroid disease    Past Surgical History:  Procedure Laterality Date   bone graft     for dental surgery   CARPAL TUNNEL RELEASE Right 02/27/2013   [facility]   COLONOSCOPY  2014   dental implants     DENTAL SURGERY     DENTAL SURGERY     dental implant   NASAL SINUS SURGERY  06/2008   Dr. Tami Ribas   TRIGGER FINGER RELEASE Right 02/27/2013   [facility]   UPPER GASTROINTESTINAL ENDOSCOPY     VAGINAL DELIVERY     2   Family History  Problem Relation Age of Onset   COPD Father    Hypertension Father    Heart disease Father    Heart disease Mother    Diabetes Maternal Grandmother    Colon cancer Neg Hx    Esophageal cancer Neg Hx    Pancreatic cancer Neg Hx    Stomach cancer Neg Hx    Liver disease Neg Hx    Social History   Socioeconomic History   Marital status: Married    Spouse name: Not on file   Number of children: 2   Years of education: Not on file   Highest education level: Not on file  Occupational History   Occupation: [employer]    Employer: [employer]  Tobacco Use   Smoking status: Never   Smokeless tobacco: Never  Vaping Use   Vaping Use: Never used  Substance and Sexual Activity   Alcohol use: Yes    Comment: occ   Drug use: No   Sexual activity: Not on file  Other Topics Concern   Not on file  Social History Narrative   Lives with husband. No pets, 2 children.      Work - [employer]      Diet - regular   Exercise - none presently   Social Determinants of Radio broadcast assistant Strain: Low Risk    Difficulty of Paying Living Expenses: Not hard at all  Food Insecurity: No Food Insecurity   Worried About Charity fundraiser in the Last Year: Never true   Arboriculturist in the Last Year: Never true  Transportation Needs: No Transportation Needs   Lack of Transportation (Medical): No   Lack of Transportation  (Non-Medical): No  Physical Activity: Not on file  Stress: No Stress Concern Present   Feeling of Stress : Not at all  Social Connections: Unknown   Frequency of Communication with Friends and Family: Not on file   Frequency of Social Gatherings  with Friends and Family: Not on file   Attends Religious Services: Not on file   Active Member of Clubs or Organizations: Not on file   Attends Archivist Meetings: Not on file   Marital Status: Married    Tobacco Counseling Counseling given: Not Answered   Clinical Intake:  Pre-visit preparation completed: Yes        Diabetes: No  How often do you need to have someone help you when you read instructions, pamphlets, or other written materials from your doctor or pharmacy?: 1 - Never  Interpreter Needed?: No    Activities of Daily Living    09/21/2021    2:13 PM  In your present state of health, do you have any difficulty performing the following activities:  Hearing? 0  Vision? 0  Difficulty concentrating or making decisions? 0  Walking or climbing stairs? 0  Dressing or bathing? 0  Doing errands, shopping? 0  Preparing Food and eating ? N  Using the Toilet? N  In the past six months, have you accidently leaked urine? N  Do you have problems with loss of bowel control? N  Managing your Medications? N  Managing your Finances? N  Housekeeping or managing your Housekeeping? N    Patient Care Team: Leone Haven, MD as PCP - General (Family Medicine)  Indicate any recent Medical Services you may have received from other than [facility] providers in the past year (date may be approximate).     Assessment:   This is a routine wellness examination for [place].  Virtual Visit via Telephone Note  I connected with  Whitney Knight on 09/21/21 at  2:00 PM EDT by telephone and verified that I am speaking with the correct person using two identifiers.  Persons participating in the virtual visit: patient/Nurse Health  Advisor   I discussed the limitations, risks, security and privacy concerns of performing an evaluation and management service by telephone and the availability of in person appointments. The patient expressed understanding and agreed to proceed.  Interactive audio and video telecommunications were attempted between this nurse and patient, however failed, due to patient having technical difficulties OR patient did not have access to video capability.  We continued and completed visit with audio only.  Some vital signs may be absent or patient reported.   OBrien-Blaney, Wilene Pharo L, LPN   Hearing/Vision screen Hearing Screening - Comments:: Hearing aids Vision Screening - Comments:: Wears reader lenses    Dietary issues and exercise activities discussed: Current Exercise Habits: Home exercise routine, Type of exercise: walking, Intensity: Mild   Goals Addressed               This Visit's Progress     Patient Stated     Healthy Lifestyle (pt-stated)        Stay active Healthy diet       Depression Screen    09/21/2021    2:13 PM 07/04/2021   11:52 AM 04/04/2021   10:00 AM 09/17/2020   10:09 AM 07/07/2020    1:42 PM 03/24/2020   11:01 AM 12/05/2019    1:41 PM  PHQ 2/9 Scores  PHQ - 2 Score 0 0 0 0 0 0 0    Fall Risk    09/21/2021    2:12 PM 07/04/2021   11:52 AM 04/04/2021   10:00 AM 09/17/2020   10:13 AM 07/07/2020    1:42 PM  Fall Risk   Falls in the past year? 0 0 0  0 0  Number falls in past yr: 0 0 0 0 0  Injury with Fall? 0 0 0 0   Risk for fall due to :  No Fall Risks No Fall Risks    Follow up Falls evaluation completed Falls evaluation completed Falls evaluation completed Falls evaluation completed Falls evaluation completed    [place]: Home free of loose throw rugs in walkways, pet beds, electrical cords, etc? Yes  Adequate lighting in your home to reduce risk of falls? Yes   ASSISTIVE DEVICES UTILIZED TO PREVENT FALLS: Life  alert? No  Use of a cane, walker or w/c? No   TIMED UP AND GO: Was the test performed? No .   Cognitive Function: Patient is alert and oriented x3.         07/28/2019   11:02 AM  6CIT Screen  What Year? 0 points  What month? 0 points  What time? 0 points    Immunizations Immunization History  Administered Date(s) Administered   Fluad Quad(high Dose 65+) 03/13/2019   Influenza Split 03/01/2014   Influenza,inj,Quad PF,6+ Mos 01/31/2013, 01/25/2015, 01/25/2018   Influenza-Unspecified 04/22/2012   Pneumococcal Polysaccharide-23 10/31/2012   Td 01/03/2007   PNA vaccine- discontinued per patient.   Shingrix vacine- discontinued per patient.   Screening Tests Health Maintenance  Topic Date Due   FOOT EXAM  10/03/2021 (Originally 07/07/2021)   INFLUENZA VACCINE  11/29/2021   HEMOGLOBIN A1C  01/04/2022   OPHTHALMOLOGY EXAM  01/27/2022   URINE MICROALBUMIN  04/04/2022   COLONOSCOPY (Pts 45-71yr Insurance coverage will need to be confirmed)  01/11/2023   MAMMOGRAM  08/09/2023   DEXA SCAN  Completed   Hepatitis C Screening  Completed   HPV VACCINES  Aged Out   Pneumonia Vaccine 69 Years old  Discontinued   TETANUS/TDAP  Discontinued   COVID-19 Vaccine  Discontinued   Zoster Vaccines- Shingrix  Discontinued   Health Maintenance There are no preventive care reminders to display for this patient.  Lung Cancer Screening: (Low Dose CT Chest recommended if Age 69-80years, 30 pack-year currently smoking OR have quit w/in 15years.) does not qualify.   Vision Screening: Recommended annual ophthalmology exams for early detection of glaucoma and other disorders of the eye.  Dental Screening: Recommended annual dental exams for proper oral hygiene  Community Resource Referral / Chronic Care Management: CRR required this visit?  No   CCM required this visit?  No      Plan:   Keep all routine maintenance appointments.   I have personally reviewed and noted the following in  the patient's chart:   Medical and social history Use of alcohol, tobacco or illicit drugs  Current medications and supplements including opioid prescriptions.  Functional ability and status Nutritional status Physical activity Advanced directives List of other physicians Hospitalizations, surgeries, and ER visits in previous 12 months Vitals Screenings to include cognitive, depression, and falls Referrals and appointments  In addition, I have reviewed and discussed with patient certain preventive protocols, quality metrics, and best practice recommendations. A written personalized care plan for preventive services as well as general preventive health recommendations were provided to patient.     OVarney Biles LPN   [id number]

## 2021-09-21 NOTE — Patient Instructions (Addendum)
  Ms. Fulmore , Thank you for taking time to come for your Medicare Wellness Visit. I appreciate your ongoing commitment to your health goals. Please review the following plan we discussed and let me know if I can assist you in the future.   These are the goals we discussed:  Goals       Patient Stated     Healthy Lifestyle (pt-stated)      Stay active Healthy diet        This is a list of the screening recommended for you and due dates:  Health Maintenance  Topic Date Due   Complete foot exam   10/03/2021*   Flu Shot  11/29/2021   Hemoglobin A1C  01/04/2022   Eye exam for diabetics  01/27/2022   Urine Protein Check  04/04/2022   Colon Cancer Screening  01/11/2023   Mammogram  08/09/2023   DEXA scan (bone density measurement)  Completed   Hepatitis C Screening: USPSTF Recommendation to screen - Ages 53-79 yo.  Completed   HPV Vaccine  Aged Out   Pneumonia Vaccine  Discontinued   Tetanus Vaccine  Discontinued   COVID-19 Vaccine  Discontinued   Zoster (Shingles) Vaccine  Discontinued  *Topic was postponed. The date shown is not the original due date.

## 2021-09-30 ENCOUNTER — Other Ambulatory Visit: Payer: Self-pay | Admitting: Family Medicine

## 2021-10-04 ENCOUNTER — Encounter: Payer: Self-pay | Admitting: Family Medicine

## 2021-10-04 ENCOUNTER — Ambulatory Visit (INDEPENDENT_AMBULATORY_CARE_PROVIDER_SITE_OTHER): Payer: Medicare Other | Admitting: Family Medicine

## 2021-10-04 DIAGNOSIS — R748 Abnormal levels of other serum enzymes: Secondary | ICD-10-CM

## 2021-10-04 DIAGNOSIS — E039 Hypothyroidism, unspecified: Secondary | ICD-10-CM | POA: Diagnosis not present

## 2021-10-04 DIAGNOSIS — M6530 Trigger finger, unspecified finger: Secondary | ICD-10-CM | POA: Diagnosis not present

## 2021-10-04 DIAGNOSIS — E782 Mixed hyperlipidemia: Secondary | ICD-10-CM | POA: Diagnosis not present

## 2021-10-04 DIAGNOSIS — E1165 Type 2 diabetes mellitus with hyperglycemia: Secondary | ICD-10-CM

## 2021-10-04 DIAGNOSIS — I10 Essential (primary) hypertension: Secondary | ICD-10-CM | POA: Diagnosis not present

## 2021-10-04 NOTE — Assessment & Plan Note (Signed)
Check LDL. 

## 2021-10-04 NOTE — Assessment & Plan Note (Addendum)
Check A1c.  She will continue glimepiride 2 mg daily with breakfast and Mounjaro 10 mg once weekly.  I encouraged continued diet and exercise changes.

## 2021-10-04 NOTE — Patient Instructions (Signed)
Nice to see you. We will get lab work today and contact you with results. Please continue with healthy diet and exercise.

## 2021-10-04 NOTE — Progress Notes (Signed)
Tommi Rumps, MD Phone: [phone]  Whitney Knight is a 71 y.o. female who presents today for f/u.  HYPERTENSION Disease Monitoring Home BP Monitoring not checking Chest pain- no    Dyspnea- no Medications Compliance-  taking amlodipine, HCTZ.  Edema- no BMET    Component Value Date/Time   NA 138 05/04/2021 1017   K 4.1 05/04/2021 1017   CL 97 05/04/2021 1017   CO2 28 05/04/2021 1017   GLUCOSE 243 (H) 05/04/2021 1017   BUN 19 05/04/2021 1017   CREATININE 1.05 (H) 05/04/2021 1017   CALCIUM 9.2 05/04/2021 1017   GFRNONAA 68 03/24/2020 1129   GFRAA 79 03/24/2020 1129   DIABETES Disease Monitoring: Blood Sugar ranges-110-200 Polyuria/phagia/dipsia- no      Optho- UTD Medications: Compliance- taking glimeperide, mounjaro Hypoglycemic symptoms- 2-3 times when she did not eat enough She has been eating a cleaner diet and has been walking for exercise.  She has also joined the gym.  HYPOTHYROIDISM Disease Monitoring Weight changes: no  Skin Changes: no Heat/Cold intolerance: no  Medication Monitoring Compliance:  taking synthroid   Last TSH:   Lab Results  Component Value Date   TSH 1.390 07/07/2020   Trigger finger: Patient notes this was in her left hand.  She saw orthopedics and had an injection and was wearing a splint.  Notes this has improved.   Social History   Tobacco Use  Smoking Status Never  Smokeless Tobacco Never    Current Outpatient Medications on File Prior to Visit  Medication Sig Dispense Refill   amLODipine (NORVASC) 5 MG tablet TAKE 1 TABLET BY MOUTH DAILY. 90 tablet 3   Betamethasone Valerate 0.12 % foam Apply to affected area once daily for up to 7 days 100 g 0   Casanthranol-Docusate Sodium 30-100 MG CAPS Take 1 tablet by mouth daily. 90 each 3   cetirizine (ZYRTEC) 10 MG tablet Take 10 mg by mouth daily.     cholecalciferol (VITAMIN D) 1000 UNITS tablet Take 1,000 Units by mouth daily.      Continuous Blood Gluc Receiver (DEXCOM G6  RECEIVER) DEVI Use to check glucose at least every 8 hours.  Diagnosis code E11.65. 1 each 0   Continuous Blood Gluc Sensor (DEXCOM G6 SENSOR) MISC Apply once every 10 days.  Diagnosis code E11.65. 3 each 3   Continuous Blood Gluc Transmit (DEXCOM G6 TRANSMITTER) MISC Use to check glucose at least every 8 hours 1 each 0   doxycycline (VIBRA-TABS) 100 MG tablet Take 1 tablet (100 mg total) by mouth 2 (two) times daily. 14 tablet 0   famotidine (PEPCID) 10 MG tablet Take 1 tablet (10 mg total) by mouth at bedtime as needed for heartburn or indigestion. 90 tablet 1   fluticasone (FLONASE) 50 MCG/ACT nasal spray USE TWO SPRAYS IN EACH NOSTRIL DAILY 16 g 1   glimepiride (AMARYL) 2 MG tablet TAKE 1 TABLET BY MOUTH ONCE DAILY WITH BREAKFAST 90 tablet 1   glucose blood (ONETOUCH ULTRA) test strip CHECK BLOOD SUGAR UP TO THREE TIMES DAILY 400 strip 1   hydrochlorothiazide (MICROZIDE) 12.5 MG capsule TAKE 1 CAPSULE BY MOUTH EVERY DAY 90 capsule 3   Insulin Pen Needle (BD PEN NEEDLE NANO U/F) 32G X 4 MM MISC Inject daily as directed. 90 each 3   Lancets (ONETOUCH DELICA PLUS [id number]) MISC USE ONCE DAILY AS DIRECTED 100 each 1   levothyroxine (SYNTHROID) 75 MCG tablet Take 1 tablet (75 mcg total) by mouth daily. 90 tablet 1  Magnesium 250 MG TABS Take 1 tablet by mouth daily.     melatonin 5 MG TABS Take 5 mg by mouth at bedtime as needed.     Multiple Vitamins-Minerals (WOMENS MULTIVITAMIN PO) Take 1 tablet by mouth in the morning and at bedtime.      Omega-3 Fatty Acids (FISH OIL) 500 MG CAPS Take 500 tablets by mouth.     omeprazole (PRILOSEC) 40 MG capsule TAKE 1 CAPSULE BY MOUTH ONCE DAILY 90 capsule 2   Potassium 99 MG TABS Take 1 tablet by mouth daily. OTC     Probiotic Product (PROBIOTIC DAILY PO) Take by mouth.     tirzepatide (MOUNJARO) 10 MG/0.5ML Pen Inject 10 mg into the skin once a week. 6 mL 1   Zoledronic Acid (RECLAST IV) Inject into the vein. Once a year     No current  facility-administered medications on file prior to visit.     ROS see history of present illness  Objective  Physical Exam Vitals:   10/04/21 1033  BP: 120/60  Pulse: 84  Temp: 98.1 F (36.7 C)  SpO2: 98%    BP Readings from Last 3 Encounters:  10/04/21 120/60  07/04/21 138/82  04/04/21 120/80   Wt Readings from Last 3 Encounters:  10/04/21 164 lb (74.4 kg)  09/21/21 170 lb (77.1 kg)  07/04/21 170 lb (77.1 kg)    Physical Exam Constitutional:      General: She is not in acute distress.    Appearance: She is not diaphoretic.  Cardiovascular:     Rate and Rhythm: Normal rate and regular rhythm.     Heart sounds: Normal heart sounds.  Pulmonary:     Effort: Pulmonary effort is normal.     Breath sounds: Normal breath sounds.  Musculoskeletal:     Right lower leg: No edema.     Left lower leg: No edema.  Skin:    General: Skin is warm and dry.  Neurological:     Mental Status: She is alert.   Diabetic Foot Exam - Simple   Simple Foot Form Diabetic Foot exam was performed with the following findings: Yes 10/04/2021 10:50 AM  Visual Inspection No deformities, no ulcerations, no other skin breakdown bilaterally: Yes Sensation Testing Intact to touch and monofilament testing bilaterally: Yes Pulse Check Posterior Tibialis and Dorsalis pulse intact bilaterally: Yes Comments      Assessment/Plan: Please see individual problem list.  Problem List Items Addressed This Visit     Hyperlipidemia (Chronic)    Check LDL.       Relevant Orders   Direct LDL   Hypertension (Chronic)    Well-controlled.  Continue HCTZ 12.5 mg daily and amlodipine 5 mg daily.       Hypothyroidism (Chronic)    Check TSH.  Continue Synthroid 75 mcg daily.       Relevant Orders   TSH   Uncontrolled type 2 diabetes mellitus with hyperglycemia (HCC) (Chronic)    Check A1c.  She will continue glimepiride 2 mg daily with breakfast and Mounjaro 10 mg once weekly.  I encouraged  continued diet and exercise changes.       Relevant Orders   HgB A1c   Alkaline phosphatase elevation    Recheck labs at patient request.  She will keep her appointment with GI.       Relevant Orders   Alkaline phosphatase, isoenzymes   Hepatic function panel   Trigger finger    Improved after injection by orthopedics.  She will monitor and continue to follow with orthopedics.        Return in about 3 months (around 01/04/2022) for Diabetes.   Tommi Rumps, MD [place]

## 2021-10-04 NOTE — Assessment & Plan Note (Signed)
Improved after injection by orthopedics.  She will monitor and continue to follow with orthopedics.

## 2021-10-04 NOTE — Assessment & Plan Note (Signed)
Well-controlled.  Continue HCTZ 12.5 mg daily and amlodipine 5 mg daily.

## 2021-10-04 NOTE — Assessment & Plan Note (Signed)
Check TSH.  Continue Synthroid 75 mcg daily. 

## 2021-10-04 NOTE — Assessment & Plan Note (Signed)
Recheck labs at patient request.  She will keep her appointment with GI.

## 2021-10-07 ENCOUNTER — Other Ambulatory Visit: Payer: Self-pay

## 2021-10-07 DIAGNOSIS — E1165 Type 2 diabetes mellitus with hyperglycemia: Secondary | ICD-10-CM

## 2021-10-07 MED ORDER — TIRZEPATIDE 12.5 MG/0.5ML ~~LOC~~ SOAJ: 12.5000 mg | SUBCUTANEOUS | 2 refills | Status: DC

## 2021-10-08 LAB — HEPATIC FUNCTION PANEL
ALT: 30 IU/L (ref 0–32)
AST: 33 IU/L (ref 0–40)
Albumin: 4.3 g/dL (ref 3.8–4.8)
Alkaline Phosphatase: 120 IU/L (ref 44–121)
Bilirubin Total: 0.4 mg/dL (ref 0.0–1.2)
Bilirubin, Direct: 0.12 mg/dL (ref 0.00–0.40)
Total Protein: 6.9 g/dL (ref 6.0–8.5)

## 2021-10-08 LAB — ALKALINE PHOSPHATASE, ISOENZYMES
BONE FRACTION: 37 % (ref 14–68)
INTESTINAL FRAC.: 22 % — ABNORMAL HIGH (ref 0–18)
LIVER FRACTION: 41 % (ref 18–85)

## 2021-10-08 LAB — LDL CHOLESTEROL, DIRECT: LDL Direct: 118 mg/dL — ABNORMAL HIGH (ref 0–99)

## 2021-10-08 LAB — TSH: TSH: 3.16 u[IU]/mL (ref 0.450–4.500)

## 2021-10-08 LAB — HEMOGLOBIN A1C
Est. average glucose Bld gHb Est-mCnc: 166 mg/dL
Hgb A1c MFr Bld: 7.4 % — ABNORMAL HIGH (ref 4.8–5.6)

## 2021-10-13 ENCOUNTER — Ambulatory Visit: Payer: Medicare Other | Admitting: Internal Medicine

## 2021-10-13 DIAGNOSIS — E1165 Type 2 diabetes mellitus with hyperglycemia: Secondary | ICD-10-CM | POA: Diagnosis not present

## 2021-10-25 ENCOUNTER — Encounter: Payer: Self-pay | Admitting: Family Medicine

## 2021-10-25 DIAGNOSIS — E1165 Type 2 diabetes mellitus with hyperglycemia: Secondary | ICD-10-CM

## 2021-10-26 ENCOUNTER — Telehealth: Payer: Self-pay

## 2021-10-26 NOTE — Telephone Encounter (Signed)
Error

## 2021-10-31 MED ORDER — DEXCOM G7 SENSOR MISC: 3 refills | Status: DC

## 2021-10-31 MED ORDER — DEXCOM G7 RECEIVER DEVI: 0 refills | Status: DC

## 2021-11-01 ENCOUNTER — Other Ambulatory Visit: Payer: Self-pay | Admitting: Family Medicine

## 2021-11-14 DIAGNOSIS — E1165 Type 2 diabetes mellitus with hyperglycemia: Secondary | ICD-10-CM | POA: Diagnosis not present

## 2021-11-29 IMAGING — CT CT CARDIAC CORONARY ARTERY CALCIUM SCORE
3 series · 14 of 20 positions shown, 16 images · non-contrast
Comparison: None.
COMPARISON: None.

Addendum:
EXAM:
OVER-READ INTERPRETATION  CT CHEST

The following report is an over-read performed by radiologist Dr.
Jorja Appel [REDACTED] on 02/24/2021. This
over-read does not include interpretation of cardiac or coronary
anatomy or pathology. The coronary calcium score interpretation by
the cardiologist is attached.
CLINICAL DATA: Risk stratification
Coronary Calcium Score
TECHNIQUE: The patient was scanned on a Siemens Somatom go.Top Scanner. Axial
non-contrast 3 mm slices were carried out through the heart. The
data set was analyzed on a dedicated work station and scored using
the Agatson method.

[Series 2: sa36 calcium scoring 3.00 · axial · 0.34mm/px · z∈[-1107,-1026]mm · 4 of 46 slices shown]
[im 10/46  vessel]
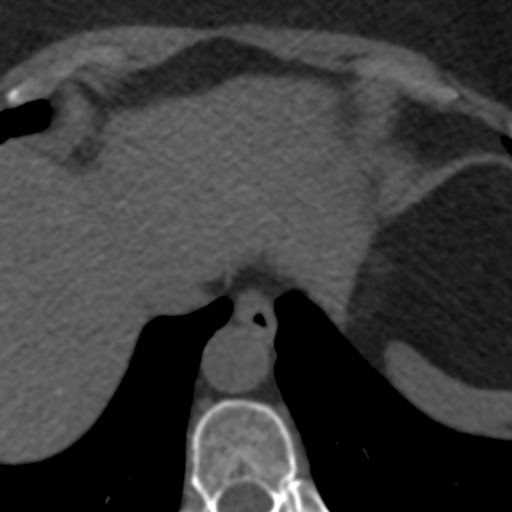
[im 19/46  vessel]
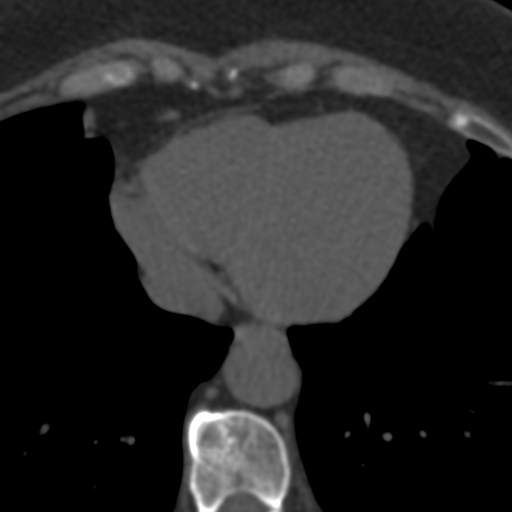
[im 28/46  vessel]
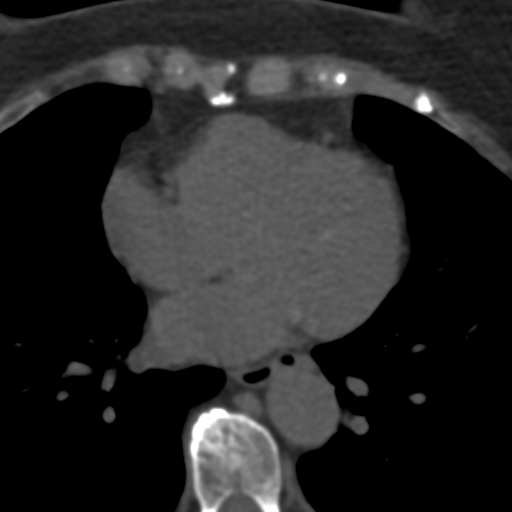
[im 37/46  vessel]
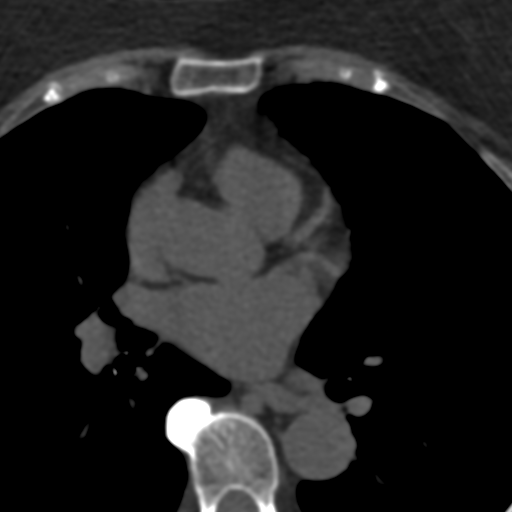

[Series 5: full fov st calcium scoring 3.00 · axial · 0.57mm/px · z∈[-1113,-1023]mm · 5 of 46 slices shown, 7 images]
[im 8/46  vessel]
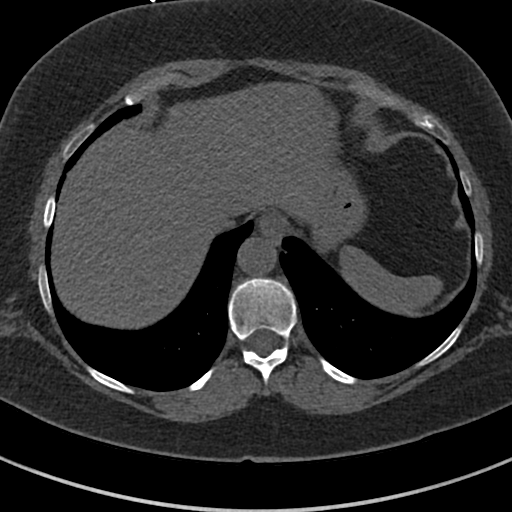
[im 8/46  lung]
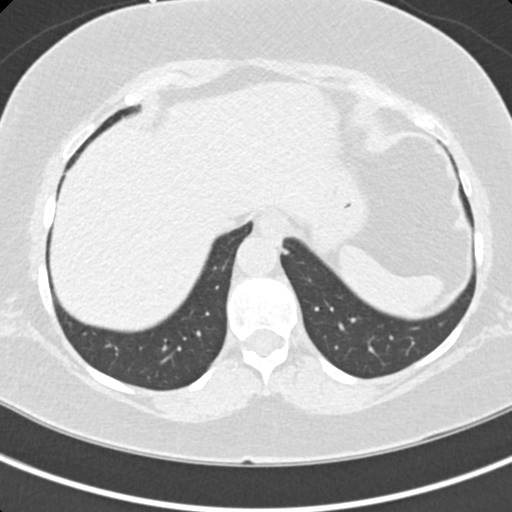
[im 16/46  vessel]
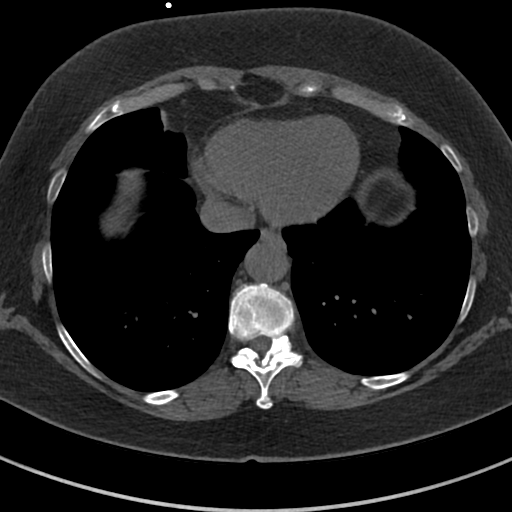
[im 23/46  vessel]
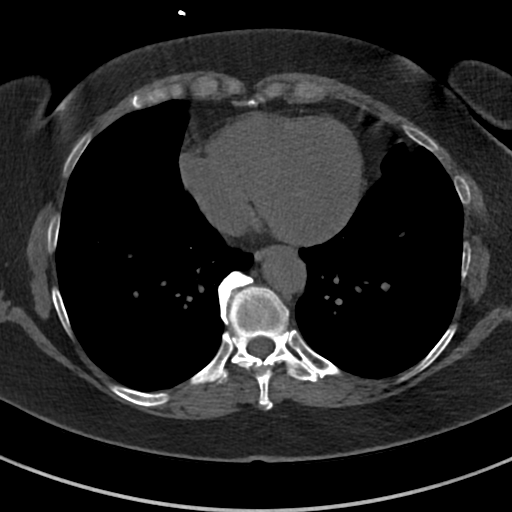
[im 31/46  vessel]
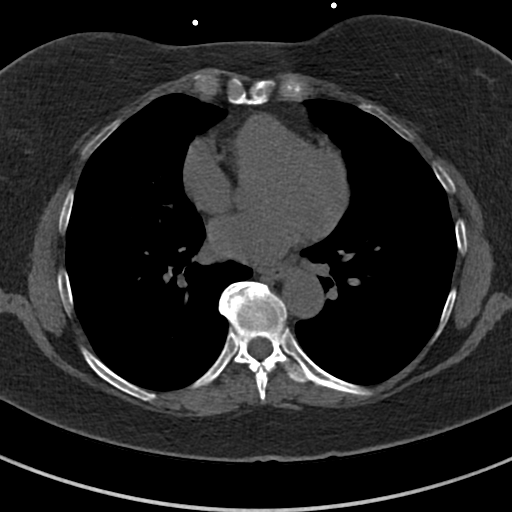
[im 38/46  vessel]
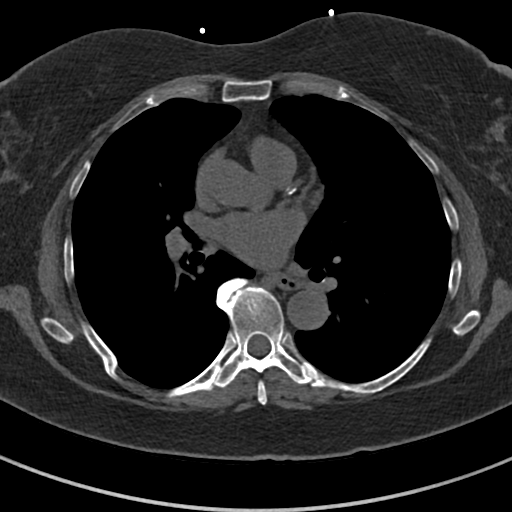
[im 38/46  lung]
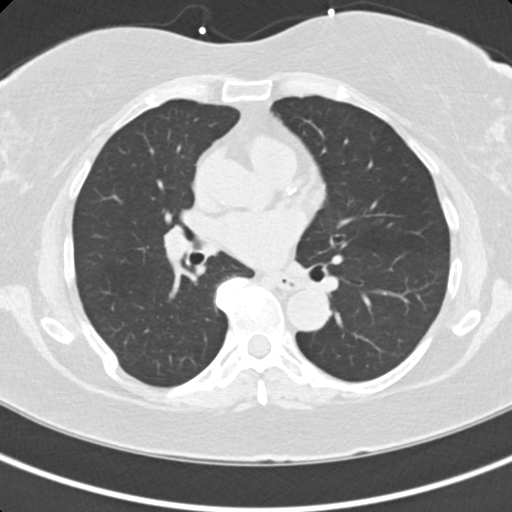

[Series 10: full fov lungs calcium scoring 3.00 ax · axial · 0.57mm/px · z∈[-1113,-1023]mm · 5 of 46 slices shown]
[im 8/46  vessel]
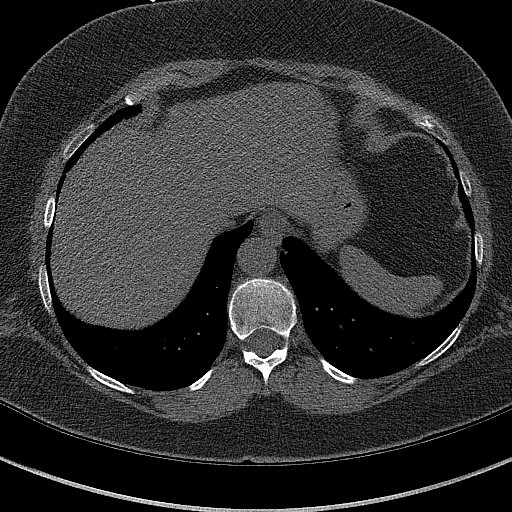
[im 16/46  vessel]
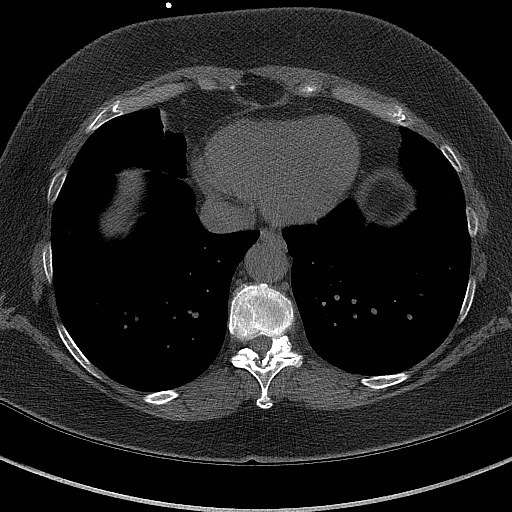
[im 23/46  vessel]
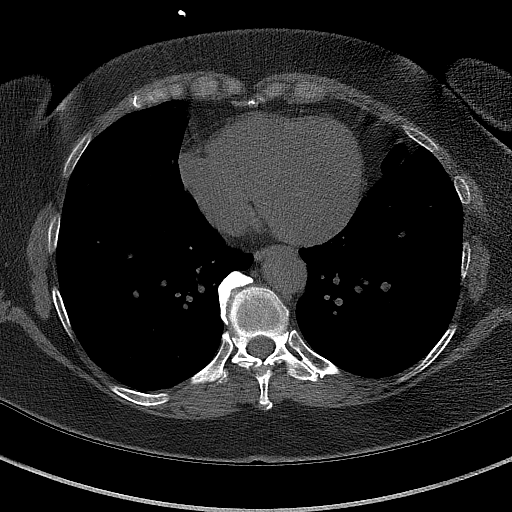
[im 31/46  vessel]
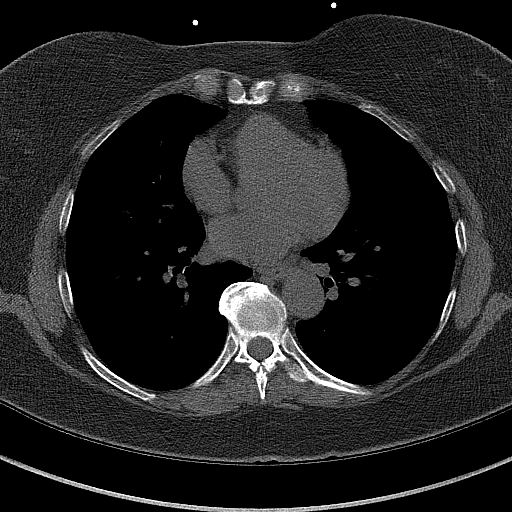
[im 38/46  vessel]
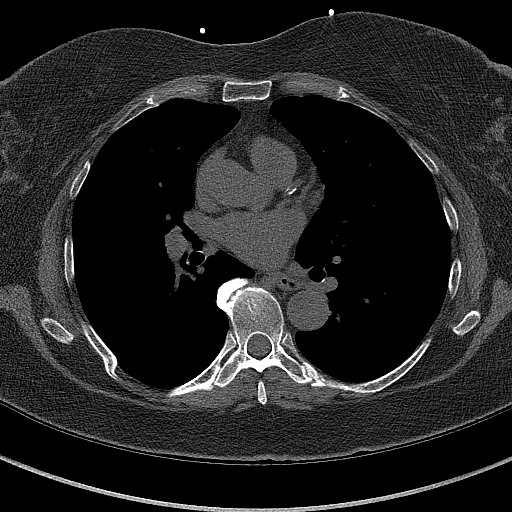

[14 of 20 positions shown; findings below may reference images not displayed]

FINDINGS: Vascular: No significant noncardiac vascular findings.

Mediastinum/Nodes: Visualized mediastinum and hilar regions
demonstrate no lymphadenopathy or masses.

Lungs/Pleura: Visualized lungs show no evidence of pulmonary edema,
consolidation, pneumothorax, nodule or pleural fluid.

Upper Abdomen: No acute abnormality.

Musculoskeletal: No chest wall mass or suspicious bone lesions
identified.
IMPRESSION: No significant incidental findings.
FINDINGS: Non-cardiac: See separate report from [REDACTED].

Ascending Aorta: Normal size

Pericardium: Normal

Coronary arteries: Normal origin of left and right coronary
arteries. Distribution of arterial calcifications if present, as
noted below;

LM 0

LAD

LCx 0

RCA 0

Total

IMPRESSION AND RECOMMENDATION:
1. Coronary calcium score of 37.1. This was 64th percentile for age
and sex matched control.

2. CAC 1-99 in LAD.  MARNIE JO VELASCO [id number].

3. Continue heart healthy lifestyle and risk factor modification.

4. Consider statin therapy due to Age >55.

Nuha Pais

*** End of Addendum ***
EXAM:
OVER-READ INTERPRETATION  CT CHEST

The following report is an over-read performed by radiologist Dr.
Jorja Appel [REDACTED] on 02/24/2021. This
over-read does not include interpretation of cardiac or coronary
anatomy or pathology. The coronary calcium score interpretation by
the cardiologist is attached.
FINDINGS: Vascular: No significant noncardiac vascular findings.

Mediastinum/Nodes: Visualized mediastinum and hilar regions
demonstrate no lymphadenopathy or masses.

Lungs/Pleura: Visualized lungs show no evidence of pulmonary edema,
consolidation, pneumothorax, nodule or pleural fluid.

Upper Abdomen: No acute abnormality.

Musculoskeletal: No chest wall mass or suspicious bone lesions
identified.
IMPRESSION: No significant incidental findings.

## 2021-12-01 DIAGNOSIS — M810 Age-related osteoporosis without current pathological fracture: Secondary | ICD-10-CM | POA: Diagnosis not present

## 2021-12-04 ENCOUNTER — Other Ambulatory Visit: Payer: Self-pay | Admitting: Family Medicine

## 2021-12-04 DIAGNOSIS — E119 Type 2 diabetes mellitus without complications: Secondary | ICD-10-CM

## 2021-12-08 DIAGNOSIS — M810 Age-related osteoporosis without current pathological fracture: Secondary | ICD-10-CM | POA: Diagnosis not present

## 2021-12-13 DIAGNOSIS — M65332 Trigger finger, left middle finger: Secondary | ICD-10-CM | POA: Diagnosis not present

## 2021-12-15 DIAGNOSIS — E1165 Type 2 diabetes mellitus with hyperglycemia: Secondary | ICD-10-CM | POA: Diagnosis not present

## 2021-12-28 ENCOUNTER — Other Ambulatory Visit: Payer: Self-pay | Admitting: Family Medicine

## 2022-01-11 ENCOUNTER — Ambulatory Visit (INDEPENDENT_AMBULATORY_CARE_PROVIDER_SITE_OTHER): Payer: Medicare Other | Admitting: Family Medicine

## 2022-01-11 ENCOUNTER — Encounter: Payer: Self-pay | Admitting: Family Medicine

## 2022-01-11 DIAGNOSIS — I10 Essential (primary) hypertension: Secondary | ICD-10-CM | POA: Diagnosis not present

## 2022-01-11 DIAGNOSIS — M810 Age-related osteoporosis without current pathological fracture: Secondary | ICD-10-CM | POA: Diagnosis not present

## 2022-01-11 DIAGNOSIS — E1165 Type 2 diabetes mellitus with hyperglycemia: Secondary | ICD-10-CM

## 2022-01-11 LAB — POCT GLYCOSYLATED HEMOGLOBIN (HGB A1C): Hemoglobin A1C: 7.1 % — AB (ref 4.0–5.6)

## 2022-01-11 NOTE — Assessment & Plan Note (Signed)
She will continue to see endocrinology. 

## 2022-01-11 NOTE — Assessment & Plan Note (Signed)
Diastolic blood pressures are above goal though I wonder if her cuff is inaccurate.  She notes her daughter works at the [facility] cardiology office in town and she will have her check it manually and check with her automated cuff to see if they are accurate.  She will let me know what the readings are.  If her daughter is not able to do this she will let us know and we can bring her back into the office for a nurse visit.  For now she will continue amlodipine 5 mg daily and HCTZ 12.5 mg daily.

## 2022-01-11 NOTE — Patient Instructions (Signed)
Nice to see you. Please see if your daughter can check your blood pressure manually and then immediately after that check with your automated blood pressure cuff.  Please contact me with the 2 readings so I can tell if your cuff is accurate.  If your daughter is not able to do this please let us know and I can get you scheduled for a nurse visit to have this done.

## 2022-01-11 NOTE — Progress Notes (Signed)
Tommi Rumps, MD Phone: [phone]  Whitney Knight is a 71 y.o. female who presents today for f/u.  HYPERTENSION Disease Monitoring Home BP Monitoring 115-125/80s-90s Chest pain- no    Dyspnea- no Medications Compliance-  taking amlodiine, HCTZ.  Edema- no BMET    Component Value Date/Time   NA 138 05/04/2021 1017   K 4.1 05/04/2021 1017   CL 97 05/04/2021 1017   CO2 28 05/04/2021 1017   GLUCOSE 243 (H) 05/04/2021 1017   BUN 19 05/04/2021 1017   CREATININE 1.05 (H) 05/04/2021 1017   CALCIUM 9.2 05/04/2021 1017   GFRNONAA 68 03/24/2020 1129   GFRAA 79 03/24/2020 1129   DIABETES Disease Monitoring: Blood Sugar ranges-62% in range, 31% high, 7% very high  Polyuria/phagia/dipsia- no      Optho- UTD Medications: Compliance- taking mounjaro (just increased to 12.5 mg dose 6 weeks ago), prn glimeperide Hypoglycemic symptoms- 1 time when she forgot to eat Patient has been walking 3 to 5 miles a day.  She has cut out diet drinks.  She is eating a healthy diet.  Osteoporosis: Patient is getting zoledronic acid through endocrinology.  She reports an improvement in her bone density score.   Social History   Tobacco Use  Smoking Status Never  Smokeless Tobacco Never    Current Outpatient Medications on File Prior to Visit  Medication Sig Dispense Refill   amLODipine (NORVASC) 5 MG tablet TAKE 1 TABLET BY MOUTH DAILY. 90 tablet 3   Betamethasone Valerate 0.12 % foam Apply to affected area once daily for up to 7 days 100 g 0   Casanthranol-Docusate Sodium 30-100 MG CAPS Take 1 tablet by mouth daily. 90 each 3   cetirizine (ZYRTEC) 10 MG tablet Take 10 mg by mouth daily.     cholecalciferol (VITAMIN D) 1000 UNITS tablet Take 1,000 Units by mouth daily.      Continuous Blood Gluc Receiver (DEXCOM G7 RECEIVER) DEVI Use to check glucose at least every 8 hours. 1 each 0   Continuous Blood Gluc Sensor (DEXCOM G7 SENSOR) MISC Apply every 10 days 3 each 3   doxycycline  (VIBRA-TABS) 100 MG tablet Take 1 tablet (100 mg total) by mouth 2 (two) times daily. 14 tablet 0   fluticasone (FLONASE) 50 MCG/ACT nasal spray USE TWO SPRAYS IN EACH NOSTRIL DAILY 16 g 1   glimepiride (AMARYL) 2 MG tablet TAKE 1 TABLET BY MOUTH ONCE DAILY WITH BREAKFAST 90 tablet 1   glucose blood (ONETOUCH ULTRA) test strip USE TO CHECK BLOOD SUGAR UP TO THREE TIMES DAILY 400 strip 1   hydrochlorothiazide (MICROZIDE) 12.5 MG capsule TAKE 1 CAPSULE BY MOUTH ONCE DAILY 90 capsule 3   Insulin Pen Needle (BD PEN NEEDLE NANO U/F) 32G X 4 MM MISC Inject daily as directed. 90 each 3   Lancets (ONETOUCH DELICA PLUS [id number]) MISC USE ONCE DAILY AS DIRECTED 100 each 1   levothyroxine (SYNTHROID) 75 MCG tablet Take 1 tablet (75 mcg total) by mouth daily. 90 tablet 1   Magnesium 250 MG TABS Take 1 tablet by mouth daily.     Multiple Vitamins-Minerals (WOMENS MULTIVITAMIN PO) Take 1 tablet by mouth in the morning and at bedtime.      Omega-3 Fatty Acids (FISH OIL) 500 MG CAPS Take 500 tablets by mouth.     Potassium 99 MG TABS Take 1 tablet by mouth daily. OTC     Probiotic Product (PROBIOTIC DAILY PO) Take by mouth.     tirzepatide (MOUNJARO) 12.5  MG/0.5ML Pen Inject 12.5 mg into the skin once a week. 6 mL 2   Zoledronic Acid (RECLAST IV) Inject into the vein. Once a year     No current facility-administered medications on file prior to visit.     ROS see history of present illness  Objective  Physical Exam Vitals:   01/11/22 1439  BP: 110/70  Pulse: 89  Temp: 97.9 F (36.6 C)  SpO2: 98%    BP Readings from Last 3 Encounters:  01/11/22 110/70  10/04/21 120/60  07/04/21 138/82   Wt Readings from Last 3 Encounters:  01/11/22 156 lb 12.8 oz (71.1 kg)  10/04/21 164 lb (74.4 kg)  09/21/21 170 lb (77.1 kg)    Physical Exam Constitutional:      General: She is not in acute distress.    Appearance: She is not diaphoretic.  Cardiovascular:     Rate and Rhythm: Normal rate and  regular rhythm.     Heart sounds: Normal heart sounds.  Pulmonary:     Effort: Pulmonary effort is normal.     Breath sounds: Normal breath sounds.  Skin:    General: Skin is warm and dry.  Neurological:     Mental Status: She is alert.      Assessment/Plan: Please see individual problem list.  Problem List Items Addressed This Visit     Hypertension (Chronic)    Diastolic blood pressures are above goal though I wonder if her cuff is inaccurate.  She notes her daughter works at the [facility] cardiology office in town and she will have her check it manually and check with her automated cuff to see if they are accurate.  She will let me know what the readings are.  If her daughter is not able to do this she will let us know and we can bring her back into the office for a nurse visit.  For now she will continue amlodipine 5 mg daily and HCTZ 12.5 mg daily.      Uncontrolled type 2 diabetes mellitus with hyperglycemia (HCC) (Chronic)    Improved control.  She will continue Mounjaro 12.5 mg once weekly.  She will continue with diet and exercise.  She can continue the glimepiride 2 mg once daily as needed for elevated glucose.      Relevant Orders   POCT HgB A1C (Completed)   Age-related osteoporosis without current pathological fracture    She will continue to see endocrinology.        Return in about 3 months (around 04/12/2022) for DM/HTN.   Tommi Rumps, MD [place]

## 2022-01-11 NOTE — Assessment & Plan Note (Signed)
Improved control.  She will continue Mounjaro 12.5 mg once weekly.  She will continue with diet and exercise.  She can continue the glimepiride 2 mg once daily as needed for elevated glucose.

## 2022-01-15 DIAGNOSIS — E1165 Type 2 diabetes mellitus with hyperglycemia: Secondary | ICD-10-CM | POA: Diagnosis not present

## 2022-01-30 LAB — HM DIABETES EYE EXAM

## 2022-02-03 ENCOUNTER — Other Ambulatory Visit: Payer: Self-pay | Admitting: Family Medicine

## 2022-02-11 ENCOUNTER — Other Ambulatory Visit: Payer: Self-pay | Admitting: Family Medicine

## 2022-02-15 DIAGNOSIS — E1165 Type 2 diabetes mellitus with hyperglycemia: Secondary | ICD-10-CM | POA: Diagnosis not present

## 2022-03-15 ENCOUNTER — Other Ambulatory Visit: Payer: Self-pay | Admitting: Family Medicine

## 2022-03-24 ENCOUNTER — Other Ambulatory Visit: Payer: Self-pay | Admitting: Family Medicine

## 2022-04-12 ENCOUNTER — Ambulatory Visit: Payer: Medicare Other | Admitting: Family Medicine

## 2022-04-17 ENCOUNTER — Telehealth: Payer: Self-pay | Admitting: Family Medicine

## 2022-04-17 NOTE — Telephone Encounter (Signed)
[employer] [employer] calling Request for Medical records for Diabetes supplies. [phone]. [place] . Fax sent on 04/13/22 , Needing 6 months of records .

## 2022-04-18 NOTE — Telephone Encounter (Signed)
6 months records sent by fax, confirmation given. Tiari Andringa,cma

## 2022-05-05 NOTE — Telephone Encounter (Signed)
I called [employer] at [phone] and they gave me the fax number of [phone] to fax the last OV notes for the dexcom 7. Fax was sent and confirmation given.  Bless Lisenby,cma

## 2022-05-05 NOTE — Telephone Encounter (Signed)
Pt calling stating that [employer] need medical records for pt in order to get her dexcom G7. Pt would like to be called Phone number-[phone]

## 2022-05-10 ENCOUNTER — Telehealth: Payer: Self-pay | Admitting: Family Medicine

## 2022-05-10 ENCOUNTER — Encounter: Payer: Self-pay | Admitting: Family Medicine

## 2022-05-10 ENCOUNTER — Telehealth (INDEPENDENT_AMBULATORY_CARE_PROVIDER_SITE_OTHER): Payer: Medicare Other | Admitting: Family Medicine

## 2022-05-10 VITALS — Ht 63.0 in | Wt 156.0 lb

## 2022-05-10 DIAGNOSIS — N3281 Overactive bladder: Secondary | ICD-10-CM

## 2022-05-10 DIAGNOSIS — E1165 Type 2 diabetes mellitus with hyperglycemia: Secondary | ICD-10-CM | POA: Diagnosis not present

## 2022-05-10 DIAGNOSIS — E039 Hypothyroidism, unspecified: Secondary | ICD-10-CM | POA: Diagnosis not present

## 2022-05-10 DIAGNOSIS — E782 Mixed hyperlipidemia: Secondary | ICD-10-CM | POA: Diagnosis not present

## 2022-05-10 DIAGNOSIS — I10 Essential (primary) hypertension: Secondary | ICD-10-CM | POA: Diagnosis not present

## 2022-05-10 DIAGNOSIS — M810 Age-related osteoporosis without current pathological fracture: Secondary | ICD-10-CM | POA: Insufficient documentation

## 2022-05-10 HISTORY — DX: Overactive bladder: N32.81

## 2022-05-10 NOTE — Assessment & Plan Note (Signed)
Chronic issue.  Continue Mounjaro 12.5 mg once weekly.  Continue glimepiride 2 mg once daily with breakfast.  Check A1c.  Check urine microalbumin creatinine ratio.

## 2022-05-10 NOTE — Telephone Encounter (Signed)
I called and LVM informing the patient that her height was changed and that the paperwork for the dexcom was faxed last week.  Walid Haig,cma

## 2022-05-10 NOTE — Progress Notes (Signed)
Virtual Visit via video Note  This visit type was conducted due to national recommendations for restrictions regarding the COVID-19 pandemic (e.g. social distancing).  This format is felt to be most appropriate for this patient at this time.  All issues noted in this document were discussed and addressed.  No physical exam was performed (except for noted visual exam findings with Video Visits).   I connected with Whitney Knight today at 11:00 AM EST by a video enabled telemedicine application or telephone and verified that I am speaking with the correct person using two identifiers. Location patient: home Location provider: work  Persons participating in the virtual visit: patient, provider  I discussed the limitations, risks, security and privacy concerns of performing an evaluation and management service by telephone and the availability of in person appointments. I also discussed with the patient that there may be a patient responsible charge related to this service. The patient expressed understanding and agreed to proceed.   Reason for visit: f/u.  HPI: HYPERTENSION Disease Monitoring Home BP Monitoring 125-130/68-71 Chest pain- no    Dyspnea- no Medications Compliance-  taking amlodipine, HCTZ.   Edema- no BMET    Component Value Date/Time   NA 138 05/04/2021 1017   K 4.1 05/04/2021 1017   CL 97 05/04/2021 1017   CO2 28 05/04/2021 1017   GLUCOSE 243 (H) 05/04/2021 1017   BUN 19 05/04/2021 1017   CREATININE 1.05 (H) 05/04/2021 1017   CALCIUM 9.2 05/04/2021 1017   GFRNONAA 68 03/24/2020 1129   GFRAA 79 03/24/2020 1129   DIABETES Disease Monitoring: Blood Sugar ranges-90-150 fasting Polyuria/phagia/dipsia- no      Optho- UTD Medications: Compliance- taking glimeperide, mounjaro Hypoglycemic symptoms- no, though occasionally drops to the 60s  Hyperlipidemia: Patient is taking red yeast rice.  She exercises by walking 1 to 3 miles a day.  She has a protein shake early in  the day and a sensible dinner later in the day.    ROS: See pertinent positives and negatives per HPI.  Past Medical History:  Diagnosis Date   Allergy    Anemia    Arthritis    KNEES   Cataract    Chronic sinusitis    Cough    Diabetes mellitus    Diverticulosis    GERD (gastroesophageal reflux disease)    Headache(784.0)    [facility] in past   Hypertension    Osteoporosis    Thyroid disease     Past Surgical History:  Procedure Laterality Date   bone graft     for dental surgery   CARPAL TUNNEL RELEASE Right 02/27/2013   [facility]   COLONOSCOPY  2014   dental implants     DENTAL SURGERY     DENTAL SURGERY     dental implant   NASAL SINUS SURGERY  06/2008   Dr. Tami Ribas   TRIGGER FINGER RELEASE Right 02/27/2013   [facility]   UPPER GASTROINTESTINAL ENDOSCOPY     VAGINAL DELIVERY     2    Family History  Problem Relation Age of Onset   COPD Father    Hypertension Father    Heart disease Father    Heart disease Mother    Diabetes Maternal Grandmother    Colon cancer Neg Hx    Esophageal cancer Neg Hx    Pancreatic cancer Neg Hx    Stomach cancer Neg Hx    Liver disease Neg Hx     SOCIAL HX: nonsmoker  Current Outpatient Medications:    amLODipine (NORVASC) 5 MG tablet, TAKE 1 TABLET BY MOUTH DAILY., Disp: 90 tablet, Rfl: 1   Betamethasone Valerate 0.12 % foam, Apply to affected area once daily for up to 7 days, Disp: 100 g, Rfl: 0   cholecalciferol (VITAMIN D) 1000 UNITS tablet, Take 1,000 Units by mouth daily. , Disp: , Rfl:    Continuous Blood Gluc Receiver ([place]) DEVI, Use to check glucose at least every 8 hours., Disp: 1 each, Rfl: 0   Continuous Blood Gluc Sensor (DEXCOM G7 SENSOR) MISC, Apply every 10 days, Disp: 3 each, Rfl: 3   doxycycline (VIBRA-TABS) 100 MG tablet, Take 1 tablet (100 mg total) by mouth 2 (two) times daily., Disp: 14 tablet, Rfl: 0   fluticasone (FLONASE) 50 MCG/ACT nasal spray, USE TWO SPRAYS IN EACH NOSTRIL  DAILY, Disp: 16 g, Rfl: 1   glimepiride (AMARYL) 2 MG tablet, TAKE 1 TABLET BY MOUTH ONCE DAILY WITH BREAKFAST, Disp: 90 tablet, Rfl: 1   glucose blood (ONETOUCH ULTRA) test strip, USE TO CHECK BLOOD SUGAR UP TO THREE TIMES DAILY, Disp: 400 strip, Rfl: 1   hydrochlorothiazide (MICROZIDE) 12.5 MG capsule, TAKE 1 CAPSULE BY MOUTH ONCE DAILY, Disp: 90 capsule, Rfl: 3   Lancets (ONETOUCH DELICA PLUS ZHYQMV78I) MISC, USE ONCE DAILY AS DIRECTED, Disp: 100 each, Rfl: 1   levothyroxine (SYNTHROID) 75 MCG tablet, TAKE 1 TABLET BY MOUTH DAILY, Disp: 90 tablet, Rfl: 1   liraglutide (VICTOZA) 18 MG/3ML SOPN, INJECT 1.8 MG UNDER THE SKIN ONCE DAILY, Disp: , Rfl:    losartan (COZAAR) 100 MG tablet, , Disp: , Rfl:    Magnesium 250 MG TABS, Take 1 tablet by mouth daily., Disp: , Rfl:    Multiple Vitamins-Minerals (WOMENS MULTIVITAMIN PO), Take 1 tablet by mouth in the morning and at bedtime. , Disp: , Rfl:    Omega-3 Fatty Acids (FISH OIL) 500 MG CAPS, Take 500 tablets by mouth., Disp: , Rfl:    omeprazole (PRILOSEC) 40 MG capsule, Take 1 tablet by mouth daily., Disp: , Rfl:    Potassium 99 MG TABS, Take 1 tablet by mouth daily. OTC, Disp: , Rfl:    tirzepatide (MOUNJARO) 12.5 MG/0.5ML Pen, Inject 12.5 mg into the skin once a week., Disp: 6 mL, Rfl: 2   Zoledronic Acid (RECLAST IV), Inject into the vein. Once a year, Disp: , Rfl:   EXAM:  VITALS per patient if applicable:  GENERAL: alert, oriented, appears well and in no acute distress  HEENT: atraumatic, conjunttiva clear, no obvious abnormalities on inspection of external nose and ears  NECK: normal movements of the head and neck  LUNGS: on inspection no signs of respiratory distress, breathing rate appears normal, no obvious gross SOB, gasping or wheezing  CV: no obvious cyanosis  MS: moves all visible extremities without noticeable abnormality  PSYCH/NEURO: pleasant and cooperative, no obvious depression or anxiety, speech and thought processing  grossly intact  ASSESSMENT AND PLAN:  Discussed the following assessment and plan:  Problem List Items Addressed This Visit     Hyperlipidemia (Chronic)    Chronic issue.  Patient will continue red yeast rice.  She declines statin therapy.  She will continue healthy diet and exercise.  Check lipid panel.      Relevant Medications   losartan (COZAAR) 100 MG tablet   Other Relevant Orders   Lipid panel   Comp Met (CMET)   Hypertension - Primary (Chronic)    Chronic issue.  Adequately  controlled.  She will continue amlodipine 5 mg daily and HCTZ 12.5 mg daily.  Check labs.      Relevant Medications   losartan (COZAAR) 100 MG tablet   Other Relevant Orders   Lipid panel   Comp Met (CMET)   Hypothyroidism (Chronic)    Chronic issue.  Check TSH.  Continue levothyroxine 75 mcg daily.      Relevant Orders   TSH   Uncontrolled type 2 diabetes mellitus with hyperglycemia (HCC) (Chronic)    Chronic issue.  Continue Mounjaro 12.5 mg once weekly.  Continue glimepiride 2 mg once daily with breakfast.  Check A1c.  Check urine microalbumin creatinine ratio.      Relevant Medications   liraglutide (VICTOZA) 18 MG/3ML SOPN   losartan (COZAAR) 100 MG tablet   Other Relevant Orders   Hemoglobin A1c   Urine Microalbumin w/creat. ratio    Return in about 6 months (around 11/08/2022) for DM.   I discussed the assessment and treatment plan with the patient. The patient was provided an opportunity to ask questions and all were answered. The patient agreed with the plan and demonstrated an understanding of the instructions.   The patient was advised to call back or seek an in-person evaluation if the symptoms worsen or if the condition fails to improve as anticipated.  Tommi Rumps, MD

## 2022-05-10 NOTE — Assessment & Plan Note (Signed)
Chronic issue.  Check TSH.  Continue levothyroxine 75 mcg daily.

## 2022-05-10 NOTE — Telephone Encounter (Signed)
Whitney Knight  P [facility] (supporting Sueanne Margarita Wade)1 hour ago (11:35 AM)   Also on. Appt paperwork has my height wrong I am NOT 24f 6   DTheodis Knight P [facility] Pool (supporting VSueanne MargaritaWade)1 hour ago (11:26 AM)   On call with Dr I forgot to ask about dexcom order has it been placed with new company yet and has he sent paperwork back to [facility]

## 2022-05-10 NOTE — Assessment & Plan Note (Signed)
Chronic issue.  Adequately controlled.  She will continue amlodipine 5 mg daily and HCTZ 12.5 mg daily.  Check labs.

## 2022-05-10 NOTE — Assessment & Plan Note (Signed)
Chronic issue.  Patient will continue red yeast rice.  She declines statin therapy.  She will continue healthy diet and exercise.  Check lipid panel.

## 2022-05-17 ENCOUNTER — Telehealth: Payer: Self-pay | Admitting: Family Medicine

## 2022-05-17 ENCOUNTER — Encounter: Payer: Self-pay | Admitting: Family Medicine

## 2022-05-17 DIAGNOSIS — E1165 Type 2 diabetes mellitus with hyperglycemia: Secondary | ICD-10-CM

## 2022-05-17 NOTE — Telephone Encounter (Signed)
MyChart Message:  BLIMY NAPOLEON  P [facility] [address] (supporting Sueanne Margarita Wade)13 hours ago (5:45 PM)   Has my dexcom been updated to [place] I GET IT   ADS TRANsferred to [facility] in [place] has prescription been updated.   I o ly have 1 left   Theodis Sato  P [facility] Admin Pool (supporting Sueanne Margarita Wade)7 days ago   Also on. Appt paperwork has my height wrong I am NOT 55f 6   DTheodis Sato P [facility] Pool (supporting VSueanne MargaritaWade)7 days ago   On call with Dr I forgot to ask about dexcom order has it been placed with new company yet and has he sent paperwork back to [employer]   SLeone Haven MD

## 2022-05-17 NOTE — Telephone Encounter (Signed)
Patient called and [employer] has not received the last virtual visit info from office, please fax to [employer] [phone]. Claim #[id number].

## 2022-05-18 ENCOUNTER — Other Ambulatory Visit (INDEPENDENT_AMBULATORY_CARE_PROVIDER_SITE_OTHER): Payer: Medicare Other

## 2022-05-18 DIAGNOSIS — E782 Mixed hyperlipidemia: Secondary | ICD-10-CM | POA: Diagnosis not present

## 2022-05-18 DIAGNOSIS — E1165 Type 2 diabetes mellitus with hyperglycemia: Secondary | ICD-10-CM | POA: Diagnosis not present

## 2022-05-18 DIAGNOSIS — I10 Essential (primary) hypertension: Secondary | ICD-10-CM

## 2022-05-18 DIAGNOSIS — E039 Hypothyroidism, unspecified: Secondary | ICD-10-CM | POA: Diagnosis not present

## 2022-05-18 LAB — COMPREHENSIVE METABOLIC PANEL
ALT: 31 U/L (ref 0–35)
AST: 31 U/L (ref 0–37)
Albumin: 4.2 g/dL (ref 3.5–5.2)
Alkaline Phosphatase: 105 U/L (ref 39–117)
BUN: 13 mg/dL (ref 6–23)
CO2: 30 mEq/L (ref 19–32)
Calcium: 8.8 mg/dL (ref 8.4–10.5)
Chloride: 96 mEq/L (ref 96–112)
Creatinine, Ser: 0.87 mg/dL (ref 0.40–1.20)
GFR: 67.95 mL/min (ref >60.00)
Glucose, Bld: 153 mg/dL — ABNORMAL HIGH (ref 70–99)
Potassium: 3.9 mEq/L (ref 3.5–5.1)
Sodium: 136 mEq/L (ref 135–145)
Total Bilirubin: 0.6 mg/dL (ref 0.2–1.2)
Total Protein: 7 g/dL (ref 6.0–8.3)

## 2022-05-18 LAB — LIPID PANEL
Cholesterol: 229 mg/dL — ABNORMAL HIGH (ref 0–200)
HDL: 77.8 mg/dL (ref >39.00)
LDL Cholesterol: 130 mg/dL — ABNORMAL HIGH (ref 0–99)
NonHDL: 151.15
Total CHOL/HDL Ratio: 3
Triglycerides: 106 mg/dL (ref 0.0–149.0)
VLDL: 21.2 mg/dL (ref 0.0–40.0)

## 2022-05-18 LAB — MICROALBUMIN / CREATININE URINE RATIO
Creatinine,U: 65.7 mg/dL
Microalb Creat Ratio: 1.5 mg/g (ref 0.0–30.0)
Microalb, Ur: 1 mg/dL (ref 0.0–1.9)

## 2022-05-18 LAB — TSH: TSH: 2.01 u[IU]/mL (ref 0.35–5.50)

## 2022-05-18 LAB — HEMOGLOBIN A1C: Hgb A1c MFr Bld: 7.4 % — ABNORMAL HIGH (ref 4.6–6.5)

## 2022-05-18 NOTE — Addendum Note (Signed)
Addended by: Neta Ehlers on: 05/18/2022 08:02 AM   Modules accepted: Orders

## 2022-05-18 NOTE — Telephone Encounter (Signed)
Paperwork was faxed and fax confirmation was given.  Jancarlo Biermann,cma

## 2022-05-19 ENCOUNTER — Other Ambulatory Visit: Payer: Self-pay | Admitting: Family Medicine

## 2022-05-19 MED ORDER — BLOOD GLUCOSE MONITOR KIT: PACK | 0 refills | Status: DC

## 2022-05-19 NOTE — Telephone Encounter (Signed)
Can you call the insurance at some point and just make sure that they received the records from [place] as well? I will send her a message outlining why they denied it though I would like to make sure they received the notes from [place].

## 2022-05-22 ENCOUNTER — Encounter: Payer: Self-pay | Admitting: Family Medicine

## 2022-06-08 ENCOUNTER — Telehealth: Payer: Self-pay

## 2022-06-08 NOTE — Telephone Encounter (Signed)
Whitney Knight called from [facility] to state they need a copy of the last clinical note for patient in order to ship patient's supplies for her continuous glucose monitor.  Whitney Knight states we may fax it to:  [phone]

## 2022-06-08 NOTE — Telephone Encounter (Signed)
Last clinical note was faxed to [employer] [facility] Janett Billow) confirmation was given.  Fay Swider,cma

## 2022-06-13 ENCOUNTER — Telehealth: Payer: Self-pay | Admitting: Family Medicine

## 2022-06-13 MED ORDER — TIRZEPATIDE 15 MG/0.5ML ~~LOC~~ SOAJ
15.0000 mg | SUBCUTANEOUS | 3 refills | Status: DC
  Filled 2022-07-10: qty 2, 28d supply, fill #0
  Filled 2022-08-03: qty 2, 28d supply, fill #1
  Filled 2022-09-05: qty 2, 28d supply, fill #2
  Filled 2022-10-03 (×2): qty 2, 28d supply, fill #3
  Filled 2022-10-31 (×2): qty 2, 28d supply, fill #4
  Filled 2022-11-24: qty 2, 28d supply, fill #5
  Filled 2022-12-22: qty 2, 28d supply, fill #6

## 2022-06-13 NOTE — Telephone Encounter (Signed)
Pt called in staying that med tirzepatide [facility]) 12.5 MG/0.5ML Pen, its nationwide out of stock and she was wondering if Dr. [name] can change her dosage to either 15 mg or 10 mg so she can have her med. She uses [facility]. She's available @336$ -[phone].

## 2022-06-13 NOTE — Telephone Encounter (Signed)
Mounjaro 15 mg sent to the patient's pharmacy.  If she has any abdominal pain or nausea with this she needs to let us know immediately.

## 2022-06-13 NOTE — Telephone Encounter (Signed)
Pt advised.

## 2022-06-22 ENCOUNTER — Encounter: Payer: Self-pay | Admitting: Family Medicine

## 2022-06-27 DIAGNOSIS — E1165 Type 2 diabetes mellitus with hyperglycemia: Secondary | ICD-10-CM | POA: Diagnosis not present

## 2022-07-08 ENCOUNTER — Encounter: Payer: Self-pay | Admitting: Family Medicine

## 2022-07-10 ENCOUNTER — Telehealth: Payer: Self-pay | Admitting: Pharmacist

## 2022-07-10 ENCOUNTER — Encounter: Payer: Self-pay | Admitting: Pharmacist

## 2022-07-10 ENCOUNTER — Other Ambulatory Visit: Payer: Self-pay

## 2022-07-10 NOTE — Telephone Encounter (Signed)
   Outreach Note  07/10/2022 Name: Whitney Knight MRN: [id number] DOB: 1950/09/26   While covering for [employer], receive a message from PCP in response to MyChart message from patient reporting difficulty with picking up her [facility] Rx due to medication back order  Contact local pharmacies and find Mounjaro 15 mg strength in stock at [facility].   Reach out to patient to let her know. Patient requests to have her prescription transferred. Provide patient with phone number to [facility]. Call back to pharmacy to request pharmacy transfer patient's Rx from [place] and fill for her.   Wallace Cullens, PharmD, [place] [phone]

## 2022-07-10 NOTE — Telephone Encounter (Signed)
This encounter was created in error - please disregard.

## 2022-07-11 ENCOUNTER — Other Ambulatory Visit: Payer: Self-pay

## 2022-07-17 ENCOUNTER — Other Ambulatory Visit: Payer: Self-pay | Admitting: Family Medicine

## 2022-07-17 DIAGNOSIS — E1165 Type 2 diabetes mellitus with hyperglycemia: Secondary | ICD-10-CM

## 2022-07-25 ENCOUNTER — Other Ambulatory Visit: Payer: Self-pay | Admitting: Family Medicine

## 2022-07-25 DIAGNOSIS — E119 Type 2 diabetes mellitus without complications: Secondary | ICD-10-CM

## 2022-08-07 ENCOUNTER — Other Ambulatory Visit: Payer: Self-pay

## 2022-08-08 ENCOUNTER — Other Ambulatory Visit: Payer: Self-pay | Admitting: Family Medicine

## 2022-08-09 ENCOUNTER — Other Ambulatory Visit: Payer: Self-pay

## 2022-08-10 ENCOUNTER — Other Ambulatory Visit: Payer: Self-pay | Admitting: Family Medicine

## 2022-08-21 ENCOUNTER — Encounter: Payer: Self-pay | Admitting: Family Medicine

## 2022-08-21 ENCOUNTER — Ambulatory Visit (INDEPENDENT_AMBULATORY_CARE_PROVIDER_SITE_OTHER): Payer: Medicare Other | Admitting: Family Medicine

## 2022-08-21 VITALS — BP 120/72 | HR 87 | Temp 97.8°F | Ht 63.0 in | Wt 148.6 lb

## 2022-08-21 DIAGNOSIS — I10 Essential (primary) hypertension: Secondary | ICD-10-CM

## 2022-08-21 DIAGNOSIS — T148XXA Other injury of unspecified body region, initial encounter: Secondary | ICD-10-CM | POA: Diagnosis not present

## 2022-08-21 DIAGNOSIS — M6530 Trigger finger, unspecified finger: Secondary | ICD-10-CM

## 2022-08-21 DIAGNOSIS — E782 Mixed hyperlipidemia: Secondary | ICD-10-CM

## 2022-08-21 DIAGNOSIS — E1165 Type 2 diabetes mellitus with hyperglycemia: Secondary | ICD-10-CM | POA: Diagnosis not present

## 2022-08-21 LAB — CBC WITH DIFFERENTIAL/PLATELET
Basophils Absolute: 0.1 10*3/uL (ref 0.0–0.1)
Basophils Relative: 1 % (ref 0.0–3.0)
Eosinophils Absolute: 0.2 10*3/uL (ref 0.0–0.7)
Eosinophils Relative: 3.4 % (ref 0.0–5.0)
HCT: 42.2 % (ref 36.0–46.0)
Hemoglobin: 14.1 g/dL (ref 12.0–15.0)
Lymphocytes Relative: 23.1 % (ref 12.0–46.0)
Lymphs Abs: 1.5 10*3/uL (ref 0.7–4.0)
MCHC: 33.5 g/dL (ref 30.0–36.0)
MCV: 85.7 fl (ref 78.0–100.0)
Monocytes Absolute: 0.6 10*3/uL (ref 0.1–1.0)
Monocytes Relative: 9.4 % (ref 3.0–12.0)
Neutro Abs: 4.1 10*3/uL (ref 1.4–7.7)
Neutrophils Relative %: 63.1 % (ref 43.0–77.0)
Platelets: 216 10*3/uL (ref 150.0–400.0)
RBC: 4.92 Mil/uL (ref 3.87–5.11)
RDW: 14.5 % (ref 11.5–15.5)
WBC: 6.4 10*3/uL (ref 4.0–10.5)

## 2022-08-21 LAB — HEMOGLOBIN A1C: Hgb A1c MFr Bld: 7 % — ABNORMAL HIGH (ref 4.6–6.5)

## 2022-08-21 MED ORDER — TETANUS-DIPHTHERIA TOXOIDS TD 5-2 LFU IM INJ: 0.5000 mL | INJECTION | Freq: Once | INTRAMUSCULAR | 0 refills | Status: AC

## 2022-08-21 MED ORDER — AMLODIPINE BESYLATE 5 MG PO TABS: 5.0000 mg | ORAL_TABLET | Freq: Every day | ORAL | 1 refills | Status: DC

## 2022-08-21 NOTE — Progress Notes (Signed)
Marikay Alar, MD Phone: [phone]  Whitney Knight is a 72 y.o. female who presents today for f/u.  HYPERTENSION Disease Monitoring Home BP Monitoring notes they are good though does not remember the numbers Chest pain- no    Dyspnea- no Medications Compliance-  taking HCTZ, amlodipine Edema- no BMET    Component Value Date/Time   NA 136 05/18/2022 0820   NA 138 05/04/2021 1017   K 3.9 05/18/2022 0820   CL 96 05/18/2022 0820   CO2 30 05/18/2022 0820   GLUCOSE 153 (H) 05/18/2022 0820   BUN 13 05/18/2022 0820   BUN 19 05/04/2021 1017   CREATININE 0.87 05/18/2022 0820   CALCIUM 8.8 05/18/2022 0820   GFRNONAA 68 03/24/2020 1129   GFRAA 79 03/24/2020 1129   DIABETES Disease Monitoring: Blood Sugar ranges-89-125, rarely above that per her report, is using dexcom Polyuria/phagia/dipsia- no      Optho- UTD Medications: Compliance- taking glimeperide, mounjaro Hypoglycemic symptoms- notes one time dropped to 60 and she drank juice with improvement in glucose level Patient has been walking 2 to 3 miles a day.  She notes she is seeing a functional doctor for her gut health.  Hyperlipidemia: Patient declines medication for this.  Left hand trigger finger: Patient notes she had an injection for this previously and notes she will not proceed with any further injections and is doing exercises for her hands.  Bruising: Patient notes bruising just on her distal arms.  She is unsure if it is related to her hitting something.  This has been going on for about a year.   Social History   Tobacco Use  Smoking Status Never  Smokeless Tobacco Never    Current Outpatient Medications on File Prior to Visit  Medication Sig Dispense Refill   Betamethasone Valerate 0.12 % foam Apply to affected area once daily for up to 7 days 100 g 0   blood glucose meter kit and supplies KIT One touch ultra mini. Use 2-4 times per day. 1 each 0   cholecalciferol (VITAMIN D) 1000 UNITS tablet Take  1,000 Units by mouth daily.      Continuous Blood Gluc Receiver (DEXCOM G7 RECEIVER) DEVI Use to check glucose at least every 8 hours. 1 each 0   Continuous Blood Gluc Sensor (DEXCOM G7 SENSOR) MISC Apply every 10 days 3 each 3   doxycycline (VIBRA-TABS) 100 MG tablet Take 1 tablet (100 mg total) by mouth 2 (two) times daily. 14 tablet 0   fluticasone (FLONASE) 50 MCG/ACT nasal spray USE TWO SPRAYS IN EACH NOSTRIL DAILY 16 g 1   glimepiride (AMARYL) 2 MG tablet TAKE 1 TABLET BY MOUTH ONCE DAILY WITH BREAKFAST 90 tablet 1   hydrochlorothiazide (MICROZIDE) 12.5 MG capsule TAKE 1 CAPSULE BY MOUTH ONCE DAILY 90 capsule 3   Lancets (ONETOUCH DELICA PLUS LANCET33G) MISC USE ONCE DAILY AS DIRECTED 100 each 1   levothyroxine (SYNTHROID) 75 MCG tablet TAKE 1 TABLET BY MOUTH DAILY 90 tablet 1   Magnesium 250 MG TABS Take 1 tablet by mouth daily.     Multiple Vitamins-Minerals (WOMENS MULTIVITAMIN PO) Take 1 tablet by mouth in the morning and at bedtime.      Omega-3 Fatty Acids (FISH OIL) 500 MG CAPS Take 500 tablets by mouth.     ONETOUCH ULTRA test strip USE TO CHECK BLOOD SUGAR UP TO THREE TIMES DAILY 400 strip 1   Potassium 99 MG TABS Take 1 tablet by mouth daily. OTC  tirzepatide (MOUNJARO) 15 MG/0.5ML Pen Inject 15 mg into the skin once a week. 6 mL 3   Zoledronic Acid (RECLAST IV) Inject into the vein. Once a year     No current facility-administered medications on file prior to visit.     ROS see history of present illness  Objective  Physical Exam Vitals:   08/21/22 1056  BP: 120/72  Pulse: 87  Temp: 97.8 F (36.6 C)  SpO2: 99%    BP Readings from Last 3 Encounters:  08/21/22 120/72  01/11/22 110/70  10/04/21 120/60   Wt Readings from Last 3 Encounters:  08/21/22 148 lb 9.6 oz (67.4 kg)  05/10/22 156 lb (70.8 kg)  01/11/22 156 lb 12.8 oz (71.1 kg)    Physical Exam Constitutional:      General: She is not in acute distress.    Appearance: She is not diaphoretic.   Cardiovascular:     Rate and Rhythm: Normal rate and regular rhythm.     Heart sounds: Normal heart sounds.  Pulmonary:     Effort: Pulmonary effort is normal.     Breath sounds: Normal breath sounds.  Musculoskeletal:     Right lower leg: No edema.     Left lower leg: No edema.  Skin:    General: Skin is warm and dry.  Neurological:     Mental Status: She is alert.      Assessment/Plan: Please see individual problem list.  Primary hypertension Assessment & Plan: Chronic issue.  Adequately controlled.  She will continue amlodipine 5 mg daily and HCTZ 12.5 mg daily.    Orders: -     amLODIPine Besylate; Take 1 tablet (5 mg total) by mouth daily.  Dispense: 90 tablet; Refill: 1  Uncontrolled type 2 diabetes mellitus with hyperglycemia Assessment & Plan: Check A1c.  Continue glimepiride 2.5 mg daily and Mounjaro 15 mg weekly.  Orders: -     Hemoglobin A1c  Mixed hyperlipidemia Assessment & Plan: Chronic issue.  Patient declines all treatment options for this.   Bruising Assessment & Plan: Likely related to thinning of skin with aging and injury to arms though we will check a CBC to rule out underlying causes.  Orders: -     CBC with Differential/Platelet  Trigger finger of left hand, unspecified finger Assessment & Plan: Chronic issue.  She will monitor.   Other orders -     Tetanus-Diphtheria Toxoids Td; Inject 0.5 mLs into the muscle once for 1 dose.  Dispense: 0.5 mL; Refill: 0     Health Maintenance: encouraged to ger tetanus vaccine at the pharmacy.  Return in about 3 months (around 11/20/2022) for Diabetes.   Marikay Alar, MD [facility]

## 2022-08-21 NOTE — Assessment & Plan Note (Addendum)
Chronic issue.  Adequately controlled.  She will continue amlodipine 5 mg daily and HCTZ 12.5 mg daily.

## 2022-08-21 NOTE — Assessment & Plan Note (Signed)
Likely related to thinning of skin with aging and injury to arms though we will check a CBC to rule out underlying causes.

## 2022-08-21 NOTE — Assessment & Plan Note (Signed)
Check A1c.  Continue glimepiride 2.5 mg daily and Mounjaro 15 mg weekly.

## 2022-08-21 NOTE — Assessment & Plan Note (Signed)
Chronic issue.  She will monitor.

## 2022-08-21 NOTE — Assessment & Plan Note (Signed)
Chronic issue.  Patient declines all treatment options for this.

## 2022-09-01 DIAGNOSIS — M79669 Pain in unspecified lower leg: Secondary | ICD-10-CM | POA: Diagnosis not present

## 2022-09-05 ENCOUNTER — Other Ambulatory Visit: Payer: Self-pay

## 2022-09-14 ENCOUNTER — Telehealth: Payer: Self-pay | Admitting: Family Medicine

## 2022-09-14 NOTE — Telephone Encounter (Signed)
Contacted [name] to schedule their annual wellness visit. Call back at later date: REQ CB 09/14/21  Whitney Knight; Care Guide Ambulatory Clinical Support [place] [facility] Direct Dial: [phone]

## 2022-09-26 DIAGNOSIS — E1165 Type 2 diabetes mellitus with hyperglycemia: Secondary | ICD-10-CM | POA: Diagnosis not present

## 2022-10-03 ENCOUNTER — Other Ambulatory Visit: Payer: Self-pay

## 2022-10-07 DIAGNOSIS — M79669 Pain in unspecified lower leg: Secondary | ICD-10-CM | POA: Diagnosis not present

## 2022-10-10 ENCOUNTER — Ambulatory Visit (INDEPENDENT_AMBULATORY_CARE_PROVIDER_SITE_OTHER): Payer: Medicare Other

## 2022-10-10 VITALS — Ht 63.0 in | Wt 148.0 lb

## 2022-10-10 DIAGNOSIS — Z0000 Encounter for general adult medical examination without abnormal findings: Secondary | ICD-10-CM | POA: Diagnosis not present

## 2022-10-10 NOTE — Patient Instructions (Signed)
Whitney Knight , Thank you for taking time to come for your Medicare Wellness Visit. I appreciate your ongoing commitment to your health goals. Please review the following plan we discussed and let me know if I can assist you in the future.   These are the goals we discussed:  Goals       DIET - EAT MORE FRUITS AND VEGETABLES      Healthy Lifestyle (pt-stated)      Stay active Healthy diet        This is a list of the screening recommended for you and due dates:  Health Maintenance  Topic Date Due   DTaP/Tdap/Td vaccine (2 - Tdap) 01/02/2017   Complete foot exam   10/05/2022   Colon Cancer Screening  01/11/2023   Flu Shot  11/30/2022   Eye exam for diabetics  01/31/2023   Hemoglobin A1C  02/20/2023   Yearly kidney function blood test for diabetes  05/19/2023   Yearly kidney health urinalysis for diabetes  05/19/2023   Mammogram  08/09/2023   Medicare Annual Wellness Visit  10/10/2023   DEXA scan (bone density measurement)  Completed   Hepatitis C Screening  Completed   HPV Vaccine  Aged Out   Pneumonia Vaccine  Discontinued   COVID-19 Vaccine  Discontinued   Zoster (Shingles) Vaccine  Discontinued    Advanced directives: no  Conditions/risks identified: none  Next appointment: Follow up in one year for your annual wellness visit 10/12/23 @ 10:45 am by phone   Preventive Care 65 Years and Older, Female Preventive care refers to lifestyle choices and visits with your health care provider that can promote health and wellness. What does preventive care include? A yearly physical exam. This is also called an annual well check. Dental exams once or twice a year. Routine eye exams. Ask your health care provider how often you should have your eyes checked. Personal lifestyle choices, including: Daily care of your teeth and gums. Regular physical activity. Eating a healthy diet. Avoiding tobacco and drug use. Limiting alcohol use. Practicing safe sex. Taking low-dose aspirin  every day. Taking vitamin and mineral supplements as recommended by your health care provider. What happens during an annual well check? The services and screenings done by your health care provider during your annual well check will depend on your age, overall health, lifestyle risk factors, and family history of disease. Counseling  Your health care provider may ask you questions about your: Alcohol use. Tobacco use. Drug use. Emotional well-being. Home and relationship well-being. Sexual activity. Eating habits. History of falls. Memory and ability to understand (cognition). Work and work Astronomer. Reproductive health. Screening  You may have the following tests or measurements: Height, weight, and BMI. Blood pressure. Lipid and cholesterol levels. These may be checked every 5 years, or more frequently if you are over 72 years old. Skin check. Lung cancer screening. You may have this screening every year starting at age 30 if you have a 30-pack-year history of smoking and currently smoke or have quit within the past 15 years. Fecal occult blood test (FOBT) of the stool. You may have this test every year starting at age 58. Flexible sigmoidoscopy or colonoscopy. You may have a sigmoidoscopy every 5 years or a colonoscopy every 10 years starting at age 50. Hepatitis C blood test. Hepatitis B blood test. Sexually transmitted disease (STD) testing. Diabetes screening. This is done by checking your blood sugar (glucose) after you have not eaten for a while (fasting). You  may have this done every 1-3 years. Bone density scan. This is done to screen for osteoporosis. You may have this done starting at age 32. Mammogram. This may be done every 1-2 years. Talk to your health care provider about how often you should have regular mammograms. Talk with your health care provider about your test results, treatment options, and if necessary, the need for more tests. Vaccines  Your health  care provider may recommend certain vaccines, such as: Influenza vaccine. This is recommended every year. Tetanus, diphtheria, and acellular pertussis (Tdap, Td) vaccine. You may need a Td booster every 10 years. Zoster vaccine. You may need this after age 66. Pneumococcal 13-valent conjugate (PCV13) vaccine. One dose is recommended after age 48. Pneumococcal polysaccharide (PPSV23) vaccine. One dose is recommended after age 49. Talk to your health care provider about which screenings and vaccines you need and how often you need them. This information is not intended to replace advice given to you by your health care provider. Make sure you discuss any questions you have with your health care provider. Document Released: 05/14/2015 Document Revised: 01/05/2016 Document Reviewed: 02/16/2015 Elsevier Interactive Patient Education  2017 [employer].  Fall Prevention in the Home Falls can cause injuries. They can happen to people of all ages. There are many things you can do to make your home safe and to help prevent falls. What can I do on the outside of my home? Regularly fix the edges of walkways and driveways and fix any cracks. Remove anything that might make you trip as you walk through a door, such as a raised step or threshold. Trim any bushes or trees on the path to your home. Use bright outdoor lighting. Clear any walking paths of anything that might make someone trip, such as rocks or tools. Regularly check to see if handrails are loose or broken. Make sure that both sides of any steps have handrails. Any raised decks and porches should have guardrails on the edges. Have any leaves, snow, or ice cleared regularly. Use sand or salt on walking paths during winter. Clean up any spills in your garage right away. This includes oil or grease spills. What can I do in the bathroom? Use night lights. Install grab bars by the toilet and in the tub and shower. Do not use towel bars as grab  bars. Use non-skid mats or decals in the tub or shower. If you need to sit down in the shower, use a plastic, non-slip stool. Keep the floor dry. Clean up any water that spills on the floor as soon as it happens. Remove soap buildup in the tub or shower regularly. Attach bath mats securely with double-sided non-slip rug tape. Do not have throw rugs and other things on the floor that can make you trip. What can I do in the bedroom? Use night lights. Make sure that you have a light by your bed that is easy to reach. Do not use any sheets or blankets that are too big for your bed. They should not hang down onto the floor. Have a firm chair that has side arms. You can use this for support while you get dressed. Do not have throw rugs and other things on the floor that can make you trip. What can I do in the kitchen? Clean up any spills right away. Avoid walking on wet floors. Keep items that you use a lot in easy-to-reach places. If you need to reach something above you, use a strong  step stool that has a grab bar. Keep electrical cords out of the way. Do not use floor polish or wax that makes floors slippery. If you must use wax, use non-skid floor wax. Do not have throw rugs and other things on the floor that can make you trip. What can I do with my stairs? Do not leave any items on the stairs. Make sure that there are handrails on both sides of the stairs and use them. Fix handrails that are broken or loose. Make sure that handrails are as long as the stairways. Check any carpeting to make sure that it is firmly attached to the stairs. Fix any carpet that is loose or worn. Avoid having throw rugs at the top or bottom of the stairs. If you do have throw rugs, attach them to the floor with carpet tape. Make sure that you have a light switch at the top of the stairs and the bottom of the stairs. If you do not have them, ask someone to add them for you. What else can I do to help prevent  falls? Wear shoes that: Do not have high heels. Have rubber bottoms. Are comfortable and fit you well. Are closed at the toe. Do not wear sandals. If you use a stepladder: Make sure that it is fully opened. Do not climb a closed stepladder. Make sure that both sides of the stepladder are locked into place. Ask someone to hold it for you, if possible. Clearly mark and make sure that you can see: Any grab bars or handrails. First and last steps. Where the edge of each step is. Use tools that help you move around (mobility aids) if they are needed. These include: Canes. Walkers. Scooters. Crutches. Turn on the lights when you go into a dark area. Replace any light bulbs as soon as they burn out. Set up your furniture so you have a clear path. Avoid moving your furniture around. If any of your floors are uneven, fix them. If there are any pets around you, be aware of where they are. Review your medicines with your doctor. Some medicines can make you feel dizzy. This can increase your chance of falling. Ask your doctor what other things that you can do to help prevent falls. This information is not intended to replace advice given to you by your health care provider. Make sure you discuss any questions you have with your health care provider. Document Released: 02/11/2009 Document Revised: 09/23/2015 Document Reviewed: 05/22/2014 Elsevier Interactive Patient Education  2017 [employer].

## 2022-10-10 NOTE — Progress Notes (Addendum)
I connected with  [name] on 10/10/22 by a audio enabled telemedicine application and verified that I am speaking with the correct person using two identifiers.  Patient Location: Home  Provider Location: Office/Clinic  I discussed the limitations of evaluation and management by telemedicine. The patient expressed understanding and agreed to proceed.  Subjective:   Whitney Knight is a 73 y.o. female who presents for Medicare Annual (Subsequent) preventive examination.  Review of Systems     Cardiac Risk Factors include: advanced age (>80men, >50 women);hypertension;dyslipidemia;diabetes mellitus     Objective:    Today's Vitals   10/10/22 1120  Weight: 148 lb (67.1 kg)  Height: 5\' 3"  (1.6 m)   Body mass index is 26.22 kg/m.     10/10/2022   11:09 AM 09/21/2021    2:11 PM 09/17/2020   10:11 AM 07/28/2019   10:51 AM 05/15/2017    3:56 PM 05/18/2015    9:28 AM  Advanced Directives  Does Patient Have a Medical Advance Directive? No Yes Yes Yes No Yes  Type of Special educational needs teacher of [place];Living will Healthcare Power of [place];Living will Healthcare Power of [place];Living will  Healthcare Power of [place];Living will  Does patient want to make changes to medical advance directive?  No - Patient declined No - Patient declined No - Patient declined    Copy of Healthcare Power of Attorney in Chart?  No - copy requested No - copy requested No - copy requested    Would patient like information on creating a medical advance directive? No - Patient declined    No - Patient declined     Current Medications (verified) Outpatient Encounter Medications as of 10/10/2022  Medication Sig   amLODipine (NORVASC) 5 MG tablet Take 1 tablet (5 mg total) by mouth daily.   blood glucose meter kit and supplies KIT One touch ultra mini. Use 2-4 times per day.   cholecalciferol (VITAMIN D) 1000 UNITS tablet Take 1,000 Units by mouth daily.    Continuous Blood Gluc  Receiver (DEXCOM G7 RECEIVER) DEVI Use to check glucose at least every 8 hours.   Continuous Blood Gluc Sensor (DEXCOM G7 SENSOR) MISC Apply every 10 days   fluticasone (FLONASE) 50 MCG/ACT nasal spray USE TWO SPRAYS IN EACH NOSTRIL DAILY   glimepiride (AMARYL) 2 MG tablet TAKE 1 TABLET BY MOUTH ONCE DAILY WITH BREAKFAST   hydrochlorothiazide (MICROZIDE) 12.5 MG capsule TAKE 1 CAPSULE BY MOUTH ONCE DAILY   Lancets (ONETOUCH DELICA PLUS LANCET33G) MISC USE ONCE DAILY AS DIRECTED   levothyroxine (SYNTHROID) 75 MCG tablet TAKE 1 TABLET BY MOUTH DAILY   Magnesium 250 MG TABS Take 1 tablet by mouth daily.   Multiple Vitamins-Minerals (WOMENS MULTIVITAMIN PO) Take 1 tablet by mouth in the morning and at bedtime.    Omega-3 Fatty Acids (FISH OIL) 500 MG CAPS Take 500 tablets by mouth.   ONETOUCH ULTRA test strip USE TO CHECK BLOOD SUGAR UP TO THREE TIMES DAILY   Potassium 99 MG TABS Take 1 tablet by mouth daily. OTC   tirzepatide (MOUNJARO) 15 MG/0.5ML Pen Inject 15 mg into the skin once a week.   Zoledronic Acid (RECLAST IV) Inject into the vein. Once a year   Betamethasone Valerate 0.12 % foam Apply to affected area once daily for up to 7 days (Patient not taking: Reported on 10/10/2022)   doxycycline (VIBRA-TABS) 100 MG tablet Take 1 tablet (100 mg total) by mouth 2 (two) times daily. (Patient not taking:  Reported on 10/10/2022)   No facility-administered encounter medications on file as of 10/10/2022.    Allergies (verified) Clarithromycin, Jardiance [empagliflozin], and Penicillins   History: Past Medical History:  Diagnosis Date   Allergy    Anemia    Arthritis    KNEES   Cataract    Chronic sinusitis    Cough    Diabetes mellitus    Diverticulosis    GERD (gastroesophageal reflux disease)    Headache(784.0)    [facility] in past   Hypertension    Osteoporosis    Thyroid disease    Past Surgical History:  Procedure Laterality Date   bone graft     for dental  surgery   CARPAL TUNNEL RELEASE Right 02/27/2013   [facility]   COLONOSCOPY  2014   dental implants     DENTAL SURGERY     DENTAL SURGERY     dental implant   NASAL SINUS SURGERY  06/2008   Dr. Jenne Campus   TRIGGER FINGER RELEASE Right 02/27/2013   [facility]   UPPER GASTROINTESTINAL ENDOSCOPY     VAGINAL DELIVERY     2   Family History  Problem Relation Age of Onset   COPD Father    Hypertension Father    Heart disease Father    Heart disease Mother    Diabetes Maternal Grandmother    Colon cancer Neg Hx    Esophageal cancer Neg Hx    Pancreatic cancer Neg Hx    Stomach cancer Neg Hx    Liver disease Neg Hx    Social History   Socioeconomic History   Marital status: Married    Spouse name: Not on file   Number of children: 2   Years of education: Not on file   Highest education level: Not on file  Occupational History   Occupation: [employer]    Employer: [employer]  Tobacco Use   Smoking status: Never   Smokeless tobacco: Never  Vaping Use   Vaping Use: Never used  Substance and Sexual Activity   Alcohol use: Yes    Comment: occ   Drug use: No   Sexual activity: Not on file  Other Topics Concern   Not on file  Social History Narrative   Lives with husband. No pets, 2 children.      Work - [employer]      Diet - regular   Exercise - none presently   Social Determinants of Corporate investment banker Strain: Low Risk  (10/10/2022)   Overall Financial Resource Strain (CARDIA)    Difficulty of Paying Living Expenses: Not hard at all  Food Insecurity: No Food Insecurity (10/10/2022)   Hunger Vital Sign    Worried About Running Out of Food in the Last Year: Never true    Ran Out of Food in the Last Year: Never true  Transportation Needs: No Transportation Needs (10/10/2022)   PRAPARE - Administrator, Civil Service (Medical): No    Lack of Transportation (Non-Medical): No  Physical Activity: Sufficiently Active (10/10/2022)   Exercise Vital Sign     Days of Exercise per Week: 7 days    Minutes of Exercise per Session: 50 min  Stress: No Stress Concern Present (10/10/2022)   [employer] of Occupational Health - Occupational Stress Questionnaire    Feeling of Stress : Not at all  Social Connections: Socially Integrated (10/10/2022)   Social Connection and Isolation Panel [NHANES]    Frequency of Communication with  Friends and Family: More than three times a week    Frequency of Social Gatherings with Friends and Family: Twice a week    Attends Religious Services: More than 4 times per year    Active Member of [employer] or Organizations: Yes    Attends Engineer, structural: More than 4 times per year    Marital Status: Married    Tobacco Counseling Counseling given: Not Answered   Clinical Intake:  Pre-visit preparation completed: Yes  Pain : No/denies pain     Nutritional Risks: None Diabetes: Yes CBG done?: No Did pt. bring in CBG monitor from home?: No  How often do you need to have someone help you when you read instructions, pamphlets, or other written materials from your doctor or pharmacy?: 1 - Never  Diabetic?yes Nutrition Risk Assessment:  Has the patient had any N/V/D within the last 2 months?  No  Does the patient have any non-healing wounds?  No  Has the patient had any unintentional weight loss or weight gain?  No   Diabetes:  Is the patient diabetic?  Yes  If diabetic, was a CBG obtained today?  No  Did the patient bring in their glucometer from home?  No  How often do you monitor your CBG's? continuous.   Financial Strains and Diabetes Management:  Are you having any financial strains with the device, your supplies or your medication? No .  Does the patient want to be seen by Chronic Care Management for management of their diabetes?  No  Would the patient like to be referred to a Nutritionist or for Diabetic Management?  No   Diabetic Exams:  Diabetic Eye Exam: Completed 01/30/22.  Pt  has been advised about the importance in completing this exam.   Diabetic Foot Exam: Completed 10/04/21. Pt has been advised about the importance in completing this exam.    Interpreter Needed?: No  Information entered by :: Kennedy Bucker, LPN   Activities of Daily Living    10/10/2022   11:10 AM  In your present state of health, do you have any difficulty performing the following activities:  Hearing? 0  Vision? 0  Difficulty concentrating or making decisions? 0  Walking or climbing stairs? 0  Dressing or bathing? 0  Doing errands, shopping? 0  Preparing Food and eating ? N  Using the Toilet? N  In the past six months, have you accidently leaked urine? N  Do you have problems with loss of bowel control? N  Managing your Medications? N  Managing your Finances? N  Housekeeping or managing your Housekeeping? N    Patient Care Team: Glori Luis, MD as PCP - General (Family Medicine)  Indicate any recent Medical Services you may have received from other than [facility] providers in the past year (date may be approximate).     Assessment:   This is a routine wellness examination for [place].  Hearing/Vision screen Hearing Screening - Comments:: Wears aids Vision Screening - Comments:: Readers- [facility]  Dietary issues and exercise activities discussed: Current Exercise Habits: Home exercise routine, Type of exercise: walking, Time (Minutes): 45, Frequency (Times/Week): 7, Weekly Exercise (Minutes/Week): 315, Intensity: Mild   Goals Addressed             This Visit's Progress    DIET - EAT MORE FRUITS AND VEGETABLES         Depression Screen    10/10/2022   11:08 AM 08/21/2022   10:58 AM 01/11/2022  2:48 PM 09/21/2021    2:13 PM 07/04/2021   11:52 AM 04/04/2021   10:00 AM 09/17/2020   10:09 AM  PHQ 2/9 Scores  PHQ - 2 Score 0 0 0 0 0 0 0  PHQ- 9 Score 0 0         Fall Risk    10/10/2022   11:10 AM 08/21/2022   10:57 AM 05/10/2022   11:10 AM 01/11/2022     2:47 PM 09/21/2021    2:12 PM  Fall Risk   Falls in the past year? 0 0 0 0 0  Number falls in past yr: 0 0 0 0 0  Injury with Fall? 0 0 0 0 0  Risk for fall due to : No Fall Risks No Fall Risks No Fall Risks No Fall Risks   Follow up Falls prevention discussed;Falls evaluation completed Falls evaluation completed Falls evaluation completed Falls evaluation completed Falls evaluation completed    FALL RISK PREVENTION PERTAINING TO THE HOME:  Any stairs in or around the home? Yes  If so, are there any without handrails? No  Home free of loose throw rugs in walkways, pet beds, electrical cords, etc? Yes  Adequate lighting in your home to reduce risk of falls? Yes   ASSISTIVE DEVICES UTILIZED TO PREVENT FALLS:  Life alert? No  Use of a cane, walker or w/c? No  Grab bars in the bathroom? No  Shower chair or bench in shower? No  Elevated toilet seat or a handicapped toilet? No    Cognitive Function:        10/10/2022   11:17 AM 07/28/2019   11:02 AM  6CIT Screen  What Year? 0 points 0 points  What month? 0 points 0 points  What time? 0 points 0 points  Count back from 20 0 points   Months in reverse 0 points   Repeat phrase 0 points   Total Score 0 points     Immunizations Immunization History  Administered Date(s) Administered   Fluad Quad(high Dose 65+) 03/13/2019   Influenza Split 03/01/2014   Influenza,inj,Quad PF,6+ Mos 01/31/2013, 01/25/2015, 01/25/2018   Influenza-Unspecified 04/22/2012   Pneumococcal Polysaccharide-23 10/31/2012   Td 01/03/2007    TDAP status: Due, Education has been provided regarding the importance of this vaccine. Advised may receive this vaccine at local pharmacy or Health Dept. Aware to provide a copy of the vaccination record if obtained from local pharmacy or Health Dept. Verbalized acceptance and understanding.  Flu Vaccine status: Declined, Education has been provided regarding the importance of this vaccine but patient still declined.  Advised may receive this vaccine at local pharmacy or Health Dept. Aware to provide a copy of the vaccination record if obtained from local pharmacy or Health Dept. Verbalized acceptance and understanding.  Pneumococcal vaccine status: Due, Education has been provided regarding the importance of this vaccine. Advised may receive this vaccine at local pharmacy or Health Dept. Aware to provide a copy of the vaccination record if obtained from local pharmacy or Health Dept. Verbalized acceptance and understanding.  Covid-19 vaccine status: Declined, Education has been provided regarding the importance of this vaccine but patient still declined. Advised may receive this vaccine at local pharmacy or Health Dept.or vaccine clinic. Aware to provide a copy of the vaccination record if obtained from local pharmacy or Health Dept. Verbalized acceptance and understanding.  Qualifies for Shingles Vaccine? Yes   Zostavax completed No   Shingrix Completed?: No.    Education has been provided  regarding the importance of this vaccine. Patient has been advised to call insurance company to determine out of pocket expense if they have not yet received this vaccine. Advised may also receive vaccine at local pharmacy or Health Dept. Verbalized acceptance and understanding.  Screening Tests Health Maintenance  Topic Date Due   DTaP/Tdap/Td (2 - Tdap) 01/02/2017   FOOT EXAM  10/05/2022   Colonoscopy  01/11/2023   INFLUENZA VACCINE  11/30/2022   OPHTHALMOLOGY EXAM  01/31/2023   HEMOGLOBIN A1C  02/20/2023   Diabetic kidney evaluation - eGFR measurement  05/19/2023   Diabetic kidney evaluation - Urine ACR  05/19/2023   MAMMOGRAM  08/09/2023   Medicare Annual Wellness (AWV)  10/10/2023   DEXA SCAN  Completed   Hepatitis C Screening  Completed   HPV VACCINES  Aged Out   Pneumonia Vaccine 14+ Years old  Discontinued   COVID-19 Vaccine  Discontinued   Zoster Vaccines- Shingrix  Discontinued    Health  Maintenance  Health Maintenance Due  Topic Date Due   DTaP/Tdap/Td (2 - Tdap) 01/02/2017   FOOT EXAM  10/05/2022   Colonoscopy  01/11/2023    Colorectal cancer screening: Type of screening: Colonoscopy. Completed 01/10/18. Repeat every 5 years  Mammogram status: Completed 08/08/21. Repeat every year- every other year  Bone Density status: Completed 10/25/17. Results reflect: Bone density results: OSTEOPOROSIS. Repeat every 2 years.- will have to check w/ [facility] she gets Reclast  Lung Cancer Screening: (Low Dose CT Chest recommended if Age 70-80 years, 30 pack-year currently smoking OR have quit w/in 15years.) does not qualify.   Additional Screening:  Hepatitis C Screening: does qualify; Completed 01/28/20  Vision Screening: Recommended annual ophthalmology exams for early detection of glaucoma and other disorders of the eye. Is the patient up to date with their annual eye exam?  Yes  Who is the provider or what is the name of the office in which the patient attends annual eye exams? [facility] If pt is not established with a provider, would they like to be referred to a provider to establish care? No .   Dental Screening: Recommended annual dental exams for proper oral hygiene  Community Resource Referral / Chronic Care Management: CRR required this visit?  No   CCM required this visit?  No      Plan:     I have personally reviewed and noted the following in the patient's chart:   Medical and social history Use of alcohol, tobacco or illicit drugs  Current medications and supplements including opioid prescriptions. Patient is not currently taking opioid prescriptions. Functional ability and status Nutritional status Physical activity Advanced directives List of other physicians Hospitalizations, surgeries, and ER visits in previous 12 months Vitals Screenings to include cognitive, depression, and falls Referrals and appointments  In addition, I  have reviewed and discussed with patient certain preventive protocols, quality metrics, and best practice recommendations. A written personalized care plan for preventive services as well as general preventive health recommendations were provided to patient.     Hal Hope, LPN   1/61/0960   Nurse Notes: none

## 2022-10-31 ENCOUNTER — Other Ambulatory Visit: Payer: Self-pay

## 2022-11-08 ENCOUNTER — Other Ambulatory Visit: Payer: Self-pay | Admitting: Family Medicine

## 2022-11-10 ENCOUNTER — Other Ambulatory Visit: Payer: Self-pay | Admitting: Family Medicine

## 2022-11-10 ENCOUNTER — Other Ambulatory Visit (HOSPITAL_COMMUNITY): Payer: Self-pay

## 2022-11-10 ENCOUNTER — Other Ambulatory Visit: Payer: Self-pay

## 2022-11-10 DIAGNOSIS — E1165 Type 2 diabetes mellitus with hyperglycemia: Secondary | ICD-10-CM

## 2022-11-10 MED ORDER — DEXCOM G7 RECEIVER DEVI
0 refills | Status: DC
Start: 2022-11-10 — End: 2023-04-03
  Filled 2022-11-10: qty 1, 30d supply, fill #0

## 2022-11-20 ENCOUNTER — Ambulatory Visit: Payer: Medicare Other | Admitting: Family Medicine

## 2022-11-24 ENCOUNTER — Other Ambulatory Visit: Payer: Self-pay

## 2022-12-05 DIAGNOSIS — J069 Acute upper respiratory infection, unspecified: Secondary | ICD-10-CM | POA: Diagnosis not present

## 2022-12-05 DIAGNOSIS — J029 Acute pharyngitis, unspecified: Secondary | ICD-10-CM | POA: Diagnosis not present

## 2022-12-06 ENCOUNTER — Other Ambulatory Visit: Payer: Self-pay

## 2022-12-06 ENCOUNTER — Encounter: Payer: Self-pay | Admitting: Family Medicine

## 2022-12-06 DIAGNOSIS — E1165 Type 2 diabetes mellitus with hyperglycemia: Secondary | ICD-10-CM

## 2022-12-06 MED ORDER — DEXCOM G7 SENSOR MISC
3 refills | Status: DC
Start: 2022-12-06 — End: 2023-04-26

## 2022-12-06 NOTE — Telephone Encounter (Signed)
Spoke to pt, pt informed me sensors go to dme company. Refill was sent to [facility]

## 2022-12-12 ENCOUNTER — Inpatient Hospital Stay
Admission: EM | Admit: 2022-12-12 | Discharge: 2022-12-16 | DRG: 321 | Disposition: A | Discharge status: Home / Self Care | Payer: Medicare Other | Attending: Obstetrics and Gynecology | Admitting: Obstetrics and Gynecology

## 2022-12-12 ENCOUNTER — Ambulatory Visit
Admission: RE | Admit: 2022-12-12 | Discharge: 2022-12-12 | Disposition: A | Discharge status: Home / Self Care | Payer: Medicare Other | Source: Ambulatory Visit | Attending: Family Medicine | Admitting: Family Medicine

## 2022-12-12 ENCOUNTER — Ambulatory Visit
Admission: RE | Admit: 2022-12-12 | Discharge: 2022-12-12 | Disposition: A | Discharge status: Home / Self Care | Payer: Medicare Other | Source: Home / Self Care | Attending: Family Medicine | Admitting: Family Medicine

## 2022-12-12 ENCOUNTER — Encounter: Payer: Self-pay | Admitting: Family Medicine

## 2022-12-12 ENCOUNTER — Emergency Department: Payer: Medicare Other

## 2022-12-12 ENCOUNTER — Other Ambulatory Visit: Payer: Self-pay

## 2022-12-12 ENCOUNTER — Ambulatory Visit: Payer: Medicare Other | Admitting: Family Medicine

## 2022-12-12 VITALS — BP 126/74 | HR 99 | Temp 98.3°F | Resp 16 | Ht 64.0 in | Wt 148.4 lb

## 2022-12-12 DIAGNOSIS — Z888 Allergy status to other drugs, medicaments and biological substances status: Secondary | ICD-10-CM

## 2022-12-12 DIAGNOSIS — T502X5A Adverse effect of carbonic-anhydrase inhibitors, benzothiadiazides and other diuretics, initial encounter: Secondary | ICD-10-CM | POA: Diagnosis not present

## 2022-12-12 DIAGNOSIS — Z825 Family history of asthma and other chronic lower respiratory diseases: Secondary | ICD-10-CM | POA: Diagnosis not present

## 2022-12-12 DIAGNOSIS — I081 Rheumatic disorders of both mitral and tricuspid valves: Secondary | ICD-10-CM | POA: Diagnosis present

## 2022-12-12 DIAGNOSIS — J810 Acute pulmonary edema: Secondary | ICD-10-CM | POA: Diagnosis not present

## 2022-12-12 DIAGNOSIS — I2510 Atherosclerotic heart disease of native coronary artery without angina pectoris: Secondary | ICD-10-CM | POA: Insufficient documentation

## 2022-12-12 DIAGNOSIS — I5041 Acute combined systolic (congestive) and diastolic (congestive) heart failure: Secondary | ICD-10-CM

## 2022-12-12 DIAGNOSIS — R0602 Shortness of breath: Secondary | ICD-10-CM

## 2022-12-12 DIAGNOSIS — Z880 Allergy status to penicillin: Secondary | ICD-10-CM

## 2022-12-12 DIAGNOSIS — Z79899 Other long term (current) drug therapy: Secondary | ICD-10-CM | POA: Diagnosis not present

## 2022-12-12 DIAGNOSIS — E1165 Type 2 diabetes mellitus with hyperglycemia: Secondary | ICD-10-CM | POA: Diagnosis not present

## 2022-12-12 DIAGNOSIS — I255 Ischemic cardiomyopathy: Secondary | ICD-10-CM | POA: Diagnosis not present

## 2022-12-12 DIAGNOSIS — J189 Pneumonia, unspecified organism: Secondary | ICD-10-CM

## 2022-12-12 DIAGNOSIS — R7989 Other specified abnormal findings of blood chemistry: Secondary | ICD-10-CM

## 2022-12-12 DIAGNOSIS — E871 Hypo-osmolality and hyponatremia: Secondary | ICD-10-CM | POA: Diagnosis not present

## 2022-12-12 DIAGNOSIS — I10 Essential (primary) hypertension: Secondary | ICD-10-CM | POA: Diagnosis present

## 2022-12-12 DIAGNOSIS — Z8249 Family history of ischemic heart disease and other diseases of the circulatory system: Secondary | ICD-10-CM

## 2022-12-12 DIAGNOSIS — I110 Hypertensive heart disease with heart failure: Secondary | ICD-10-CM | POA: Diagnosis not present

## 2022-12-12 DIAGNOSIS — E785 Hyperlipidemia, unspecified: Secondary | ICD-10-CM | POA: Diagnosis present

## 2022-12-12 DIAGNOSIS — I517 Cardiomegaly: Secondary | ICD-10-CM

## 2022-12-12 DIAGNOSIS — E039 Hypothyroidism, unspecified: Secondary | ICD-10-CM | POA: Diagnosis present

## 2022-12-12 DIAGNOSIS — R0601 Orthopnea: Secondary | ICD-10-CM | POA: Insufficient documentation

## 2022-12-12 DIAGNOSIS — Z833 Family history of diabetes mellitus: Secondary | ICD-10-CM | POA: Diagnosis not present

## 2022-12-12 DIAGNOSIS — E876 Hypokalemia: Secondary | ICD-10-CM | POA: Diagnosis not present

## 2022-12-12 DIAGNOSIS — J9811 Atelectasis: Secondary | ICD-10-CM | POA: Diagnosis not present

## 2022-12-12 DIAGNOSIS — Z0389 Encounter for observation for other suspected diseases and conditions ruled out: Secondary | ICD-10-CM | POA: Diagnosis not present

## 2022-12-12 DIAGNOSIS — I2109 ST elevation (STEMI) myocardial infarction involving other coronary artery of anterior wall: Secondary | ICD-10-CM | POA: Diagnosis not present

## 2022-12-12 DIAGNOSIS — J90 Pleural effusion, not elsewhere classified: Secondary | ICD-10-CM | POA: Diagnosis not present

## 2022-12-12 DIAGNOSIS — Z7902 Long term (current) use of antithrombotics/antiplatelets: Secondary | ICD-10-CM | POA: Diagnosis not present

## 2022-12-12 DIAGNOSIS — I5021 Acute systolic (congestive) heart failure: Secondary | ICD-10-CM | POA: Diagnosis present

## 2022-12-12 DIAGNOSIS — I420 Dilated cardiomyopathy: Secondary | ICD-10-CM | POA: Diagnosis not present

## 2022-12-12 DIAGNOSIS — I509 Heart failure, unspecified: Secondary | ICD-10-CM | POA: Diagnosis not present

## 2022-12-12 DIAGNOSIS — Z7985 Long-term (current) use of injectable non-insulin antidiabetic drugs: Secondary | ICD-10-CM

## 2022-12-12 DIAGNOSIS — K219 Gastro-esophageal reflux disease without esophagitis: Secondary | ICD-10-CM | POA: Diagnosis not present

## 2022-12-12 DIAGNOSIS — D509 Iron deficiency anemia, unspecified: Secondary | ICD-10-CM | POA: Diagnosis not present

## 2022-12-12 DIAGNOSIS — Z1152 Encounter for screening for COVID-19: Secondary | ICD-10-CM

## 2022-12-12 DIAGNOSIS — E118 Type 2 diabetes mellitus with unspecified complications: Secondary | ICD-10-CM | POA: Diagnosis present

## 2022-12-12 DIAGNOSIS — I214 Non-ST elevation (NSTEMI) myocardial infarction: Secondary | ICD-10-CM | POA: Diagnosis not present

## 2022-12-12 DIAGNOSIS — R935 Abnormal findings on diagnostic imaging of other abdominal regions, including retroperitoneum: Secondary | ICD-10-CM

## 2022-12-12 DIAGNOSIS — E7800 Pure hypercholesterolemia, unspecified: Secondary | ICD-10-CM | POA: Diagnosis not present

## 2022-12-12 DIAGNOSIS — R918 Other nonspecific abnormal finding of lung field: Secondary | ICD-10-CM | POA: Diagnosis not present

## 2022-12-12 HISTORY — DX: Hypo-osmolality and hyponatremia: E87.1

## 2022-12-12 HISTORY — DX: Acute systolic (congestive) heart failure: I50.21

## 2022-12-12 LAB — BASIC METABOLIC PANEL
Anion gap: 12 (ref 5–15)
BUN: 17 mg/dL (ref 6–23)
BUN: 22 mg/dL (ref 8–23)
CO2: 24 mEq/L (ref 19–32)
CO2: 23 mmol/L (ref 22–32)
Calcium: 8.8 mg/dL (ref 8.4–10.5)
Calcium: 8.6 mg/dL — ABNORMAL LOW (ref 8.9–10.3)
Chloride: 89 mEq/L — ABNORMAL LOW (ref 96–112)
Chloride: 91 mmol/L — ABNORMAL LOW (ref 98–111)
Creatinine, Ser: 0.95 mg/dL (ref 0.40–1.20)
Creatinine, Ser: 0.97 mg/dL (ref 0.44–1.00)
GFR, Estimated: >60 mL/min (ref >60)
GFR: 60.9 mL/min (ref >60.00)
Glucose, Bld: 175 mg/dL — ABNORMAL HIGH (ref 70–99)
Glucose, Bld: 173 mg/dL — ABNORMAL HIGH (ref 70–99)
Potassium: 3.7 mEq/L (ref 3.5–5.1)
Potassium: 3.8 mmol/L (ref 3.5–5.1)
Sodium: 123 mEq/L — ABNORMAL LOW (ref 135–145)
Sodium: 126 mmol/L — ABNORMAL LOW (ref 135–145)

## 2022-12-12 LAB — OSMOLALITY: Osmolality: 288 mOsm/kg (ref 275–295)

## 2022-12-12 LAB — IBC + FERRITIN
Ferritin: 55.9 ng/mL (ref 10.0–291.0)
Iron: 33 ug/dL — ABNORMAL LOW (ref 42–145)
Saturation Ratios: 12.3 % — ABNORMAL LOW (ref 20.0–50.0)
TIBC: 268.8 ug/dL (ref 250.0–450.0)
Transferrin: 192 mg/dL — ABNORMAL LOW (ref 212.0–360.0)

## 2022-12-12 LAB — CBC
HCT: 37.8 % (ref 36.0–46.0)
Hemoglobin: 12.2 g/dL (ref 12.0–15.0)
MCHC: 32.2 g/dL (ref 30.0–36.0)
MCV: 86.9 fl (ref 78.0–100.0)
Platelets: 354 10*3/uL (ref 150.0–400.0)
RBC: 4.35 Mil/uL (ref 3.87–5.11)
RDW: 14.3 % (ref 11.5–15.5)
WBC: 13.6 10*3/uL — ABNORMAL HIGH (ref 4.0–10.5)

## 2022-12-12 LAB — GLUCOSE, CAPILLARY: Glucose-Capillary: 202 mg/dL — ABNORMAL HIGH (ref 70–99)

## 2022-12-12 LAB — POC COVID19 BINAXNOW: SARS Coronavirus 2 Ag: NEGATIVE

## 2022-12-12 LAB — TSH: TSH: 2.723 u[IU]/mL (ref 0.350–4.500)

## 2022-12-12 LAB — BRAIN NATRIURETIC PEPTIDE: Pro B Natriuretic peptide (BNP): 1663 pg/mL — ABNORMAL HIGH (ref 0.0–100.0)

## 2022-12-12 LAB — TROPONIN I (HIGH SENSITIVITY): Troponin I (High Sensitivity): 1790 ng/L (ref <18)

## 2022-12-12 MED ORDER — DOXYCYCLINE HYCLATE 100 MG PO TABS: 100.0000 mg | ORAL_TABLET | Freq: Two times a day (BID) | ORAL | 0 refills | Status: DC

## 2022-12-12 MED ORDER — SODIUM CHLORIDE 0.9% FLUSH
3.0000 mL | Freq: Two times a day (BID) | INTRAVENOUS | Status: DC
  Administered 2022-12-12 – 2022-12-13 (×3): 3 mL via INTRAVENOUS

## 2022-12-12 MED ORDER — FUROSEMIDE 10 MG/ML IJ SOLN
40.0000 mg | Freq: Once | INTRAMUSCULAR | Status: AC
  Administered 2022-12-12: 40 mg via INTRAVENOUS
  Filled 2022-12-12: qty 4

## 2022-12-12 MED ORDER — FUROSEMIDE 10 MG/ML IJ SOLN: 20.0000 mg | Freq: Once | INTRAMUSCULAR | Status: DC

## 2022-12-12 MED ORDER — SACCHAROMYCES BOULARDII 250 MG PO CAPS
250.0000 mg | ORAL_CAPSULE | Freq: Every day | ORAL | 0 refills | Status: DC
Start: 2022-12-12 — End: 2023-03-24

## 2022-12-12 MED ORDER — ALBUTEROL SULFATE HFA 108 (90 BASE) MCG/ACT IN AERS
2.0000 | INHALATION_SPRAY | Freq: Four times a day (QID) | RESPIRATORY_TRACT | 0 refills | Status: DC | PRN
Start: 2022-12-12 — End: 2023-01-11

## 2022-12-12 MED ORDER — LEVOTHYROXINE SODIUM 50 MCG PO TABS
75.0000 ug | ORAL_TABLET | Freq: Every day | ORAL | Status: DC
  Administered 2022-12-13 – 2022-12-16 (×4): 75 ug via ORAL
  Filled 2022-12-12 (×4): qty 1

## 2022-12-12 MED ORDER — ALBUTEROL SULFATE (2.5 MG/3ML) 0.083% IN NEBU: 2.5000 mg | INHALATION_SOLUTION | Freq: Four times a day (QID) | RESPIRATORY_TRACT | Status: DC | PRN

## 2022-12-12 MED ORDER — ACETAMINOPHEN 325 MG PO TABS: 650.0000 mg | ORAL_TABLET | ORAL | Status: DC | PRN

## 2022-12-12 MED ORDER — FUROSEMIDE 10 MG/ML IJ SOLN
40.0000 mg | Freq: Two times a day (BID) | INTRAMUSCULAR | Status: DC
  Administered 2022-12-13 – 2022-12-14 (×3): 40 mg via INTRAVENOUS
  Filled 2022-12-12 (×3): qty 4

## 2022-12-12 MED ORDER — ONDANSETRON HCL 4 MG/2ML IJ SOLN: 4.0000 mg | Freq: Four times a day (QID) | INTRAMUSCULAR | Status: DC | PRN

## 2022-12-12 MED ORDER — SODIUM CHLORIDE 0.9 % IV SOLN: 250.0000 mL | INTRAVENOUS | Status: DC | PRN

## 2022-12-12 MED ORDER — INSULIN GLARGINE-YFGN 100 UNIT/ML ~~LOC~~ SOLN
5.0000 [IU] | Freq: Every day | SUBCUTANEOUS | Status: DC
  Administered 2022-12-12 – 2022-12-14 (×3): 5 [IU] via SUBCUTANEOUS
  Filled 2022-12-12 (×5): qty 0.05

## 2022-12-12 MED ORDER — INSULIN ASPART 100 UNIT/ML IJ SOLN
0.0000 [IU] | Freq: Three times a day (TID) | INTRAMUSCULAR | Status: DC
  Administered 2022-12-13 – 2022-12-16 (×3): 2 [IU] via SUBCUTANEOUS
  Filled 2022-12-12 (×3): qty 1

## 2022-12-12 MED ORDER — IOHEXOL 350 MG/ML SOLN
75.0000 mL | Freq: Once | INTRAVENOUS | Status: AC | PRN
  Administered 2022-12-12: 75 mL via INTRAVENOUS

## 2022-12-12 MED ORDER — ENOXAPARIN SODIUM 40 MG/0.4ML IJ SOSY
40.0000 mg | PREFILLED_SYRINGE | INTRAMUSCULAR | Status: DC
  Administered 2022-12-12 – 2022-12-15 (×4): 40 mg via SUBCUTANEOUS
  Filled 2022-12-12 (×4): qty 0.4

## 2022-12-12 MED ORDER — AMLODIPINE BESYLATE 5 MG PO TABS: 5.0000 mg | ORAL_TABLET | Freq: Every day | ORAL | Status: DC

## 2022-12-12 MED ORDER — LOSARTAN POTASSIUM 25 MG PO TABS: 25.0000 mg | ORAL_TABLET | Freq: Every day | ORAL | Status: DC

## 2022-12-12 MED ORDER — SODIUM CHLORIDE 0.9% FLUSH: 3.0000 mL | INTRAVENOUS | Status: DC | PRN

## 2022-12-12 NOTE — ED Notes (Signed)
See triage note  Presents with family from [facility]  States she was having some SOB

## 2022-12-12 NOTE — Assessment & Plan Note (Signed)
Most likely secondary to hypervolemia and underlying new onset heart failure  - Lasix as noted above - Urine osm, urine sodium and serum osm pending - BMP every 6-8 hours - Goal sodium over the next 24 hours = 129 - 131

## 2022-12-12 NOTE — Assessment & Plan Note (Signed)
-   Hold home hydrochlorothiazide in the setting of IV diuresis - Continue home amlodipine

## 2022-12-12 NOTE — Assessment & Plan Note (Signed)
Continue Synthroid °

## 2022-12-12 NOTE — ED Provider Notes (Signed)
[facility] Provider Note   Event Date/Time   First MD Initiated Contact with Patient 12/12/22 1743     (approximate) History  Shortness of Breath  HPI Whitney Knight is a 72 y.o. female who presents from urgent care after presenting for shortness of breath with concerns for possible heart failure.  Patient states that she has had worsening shortness of breath.  Patient states she does not have any history of ACS, chest pain, or taking any cardiac medications. ROS: Patient currently denies any vision changes, tinnitus, difficulty speaking, facial droop, sore throat, chest pain, abdominal pain, nausea/vomiting/diarrhea, dysuria, or weakness/numbness/paresthesias in any extremity   Physical Exam  Triage Vital Signs: ED Triage Vitals [12/12/22 1659]  Encounter Vitals Group     BP (!) 151/80     Systolic BP Percentile      Diastolic BP Percentile      Pulse Rate 97     Resp 18     Temp 97.6 F (36.4 C)     Temp Source Oral     SpO2 97 %     Weight      Height      Head Circumference      Peak Flow      Pain Score 0     Pain Loc      Pain Education      Exclude from Growth Chart    Most recent vital signs: Vitals:   12/12/22 1659 12/12/22 2044  BP: (!) 151/80 124/81  Pulse: 97 92  Resp: 18 18  Temp: 97.6 F (36.4 C) 97.9 F (36.6 C)  SpO2: 97% 99%   General: Awake, oriented x4. CV:  Good peripheral perfusion.  Resp:  Normal effort.  Abd:  No distention.  Other:  Elderly overweight Caucasian female resting comfortably in no acute distress ED Results / Procedures / Treatments  Labs (all labs ordered are listed, but only abnormal results are displayed) Labs Reviewed  GLUCOSE, CAPILLARY - Abnormal; Notable for the following components:      Result Value   Glucose-Capillary 202 (*)    All other components within normal limits  TROPONIN I (HIGH SENSITIVITY) - Abnormal; Notable for the following components:   Troponin I (High Sensitivity)  1,790 (*)    All other components within normal limits  RESPIRATORY PANEL BY PCR  TSH  OSMOLALITY  OSMOLALITY, URINE  SODIUM, URINE, RANDOM  HIV ANTIBODY (ROUTINE TESTING W REFLEX)  BASIC METABOLIC PANEL  BASIC METABOLIC PANEL  CBC WITH DIFFERENTIAL/PLATELET  MAGNESIUM  C-REACTIVE PROTEIN  TROPONIN I (HIGH SENSITIVITY)   EKG ED ECG REPORT I, Merwyn Katos, the attending physician, personally viewed and interpreted this ECG. Date: 12/12/2022 EKG Time: 1710 Rate: 95 Rhythm: normal sinus rhythm QRS Axis: normal Intervals: Nonspecific intraventricular conduction block ST/T Wave abnormalities: Inferior T wave abnormalities concerning for inferior ischemia Narrative Interpretation: no evidence of acute ischemia RADIOLOGY ED MD interpretation: 2 view chest x-ray interpreted apparently by me and shows new asymmetric fullness of the right hilum with recommendation of contrast-enhanced chest CT to exclude right hilar mass.  There is also bilateral pleural effusions  CT angiography of the chest interpreted independently by me and shows no evidence of acute pulmonary emboli.  There is small to moderate bilateral pleural effusions with diffuse interstitial edema concerning for congestive heart failure.  There is also incidentally found foci of air identified overlying the anterior lateral margin of the right hepatic lobe -Agree with radiology assessment  Official radiology report(s): CT Angio Chest PE W/Cm &/Or Wo Cm  Result Date: 12/12/2022 CLINICAL DATA:  Rule out pulmonary embolism.  Shortness of breath. EXAM: CT ANGIOGRAPHY CHEST WITH CONTRAST TECHNIQUE: Multidetector CT imaging of the chest was performed using the standard protocol during bolus administration of intravenous contrast. Multiplanar CT image reconstructions and MIPs were obtained to evaluate the vascular anatomy. RADIATION DOSE REDUCTION: This exam was performed according to the departmental dose-optimization program which  includes automated exposure control, adjustment of the mA and/or kV according to patient size and/or use of iterative reconstruction technique. CONTRAST:  75mL OMNIPAQUE IOHEXOL 350 MG/ML SOLN COMPARISON:  None Available. FINDINGS: Cardiovascular: Satisfactory opacification of the pulmonary arteries to the segmental level. No evidence of pulmonary embolism. Normal heart size. No pericardial effusion. Mild aortic atherosclerosis and coronary artery calcification. No pericardial effusion. Mediastinum/Nodes: Thyroid gland and trachea appear normal. Unremarkable appearance of the esophagus. No mediastinal or hilar adenopathy. Lungs/Pleura: Small to moderate bilateral pleural effusions. Atelectasis and airspace consolidation noted in the right middle lobe and right lower lobe. Signs of interstitial edema with interlobular septal thickening and diffuse hazy ground-glass attenuation is noted bilaterally. Areas of subsegmental atelectasis are identified within the lingula and posterior left lower lobe. Upper Abdomen: Several foci of gas are identified overlying the anterolateral margin of the right hepatic lobe for which pneumoperitoneum cannot be excluded, image 149/4. Musculoskeletal: Multiple healed right rib fractures identified. No acute or suspicious osseous findings. Review of the MIP images confirms the above findings. IMPRESSION: 1. No evidence for acute pulmonary embolus. 2. Small to moderate bilateral pleural effusions. Diffuse interstitial edema is identified. Correlate for signs/symptoms of congestive heart failure. 3. Atelectasis and airspace consolidation noted in the right middle lobe and right lower lobe. 4. Several foci of gas are identified overlying the anterolateral margin of the right hepatic lobe for which pneumoperitoneum cannot be excluded. 5. Coronary artery calcification. 6.  Aortic Atherosclerosis (ICD10-I70.0). Critical Value/emergent results were called by telephone at the time of  interpretation on 12/12/2022 at 6:53 pm to provider [facility] , who verbally acknowledged these results. Electronically Signed   By: Signa Kell M.D.   On: 12/12/2022 18:53   DG Chest 2 View  Addendum Date: 12/12/2022   ADDENDUM REPORT: 12/12/2022 16:09 ADDENDUM: Corrected report: Original report was generated with an error due to voice recognition software, corrected as follows: Comparison dated chest x-ray dated May 15, 2016, rib series dated May 15, 2017 Electronically Signed   By: Allegra Lai M.D.   On: 12/12/2022 16:09   Result Date: 12/12/2022 CLINICAL DATA:  Shortness of breath EXAM: CHEST - 2 VIEW COMPARISON:  Chest x-ray dated May 15, 2026 FINDINGS: New asymmetric fullness of the right hilum. Normal heart size. Small right-greater-than-left pleural effusions. No evidence of pneumothorax. Old left-sided rib fractures. IMPRESSION: 1. New asymmetric fullness of the right hilum. Recommend contrast-enhanced chest CT to exclude right hilar mass. 2. Small right-greater-than-left pleural effusions. Electronically Signed: By: Allegra Lai M.D. On: 12/12/2022 15:22   PROCEDURES: Critical Care performed: Yes, see critical care procedure note(s) .1-3 Lead EKG Interpretation  Performed by: Merwyn Katos, MD Authorized by: Merwyn Katos, MD     Interpretation: normal     ECG rate:  71   ECG rate assessment: normal     Rhythm: sinus rhythm     Ectopy: none     Conduction: normal    MEDICATIONS ORDERED IN ED: Medications  furosemide (LASIX) injection 40 mg (has no  administration in time range)  sodium chloride flush (NS) 0.9 % injection 3 mL (3 mLs Intravenous Given 12/12/22 2125)  sodium chloride flush (NS) 0.9 % injection 3 mL (has no administration in time range)  0.9 %  sodium chloride infusion (has no administration in time range)  acetaminophen (TYLENOL) tablet 650 mg (has no administration in time range)  ondansetron (ZOFRAN) injection 4 mg (has no  administration in time range)  enoxaparin (LOVENOX) injection 40 mg (40 mg Subcutaneous Given 12/12/22 2124)  insulin glargine-yfgn (SEMGLEE) injection 5 Units (5 Units Subcutaneous Given 12/12/22 2124)  insulin aspart (novoLOG) injection 0-15 Units (has no administration in time range)  levothyroxine (SYNTHROID) tablet 75 mcg (has no administration in time range)  losartan (COZAAR) tablet 25 mg (has no administration in time range)  albuterol (PROVENTIL) (2.5 MG/3ML) 0.083% nebulizer solution 2.5 mg (has no administration in time range)  iohexol (OMNIPAQUE) 350 MG/ML injection 75 mL (75 mLs Intravenous Contrast Given 12/12/22 1821)  furosemide (LASIX) injection 40 mg (40 mg Intravenous Given 12/12/22 2123)   IMPRESSION / MDM / ASSESSMENT AND PLAN / ED COURSE  I reviewed the triage vital signs and the nursing notes.                             The patient is on the cardiac monitor to evaluate for evidence of arrhythmia and/or significant heart rate changes. Patient's presentation is most consistent with acute presentation with potential threat to life or bodily function. 71-year-old female with the above-stated past medical history presents from urgent care with concerns of heart failure given shortness of breath and pulmonary edema seen on chest x-ray Endorses dyspnea, denies LE edema  Workup: ECG, CBC, BMP, Troponin, BNP, CXR Findings: EKG: No STEMI and no evidence of Brugada's sign, delta wave, epsilon wave, significantly prolonged QTc, or malignant arrhythmia. BNP: 1790 CXR: Pulmonary edema Based on history, exam and findings, presentation most consistent with acute on chronic heart failure. Low suspicion for PNA, ACS, tamponade, aortic dissection. Interventions: Oxygen, Diuresis   Disposition (Stable but not significantly improved): Admit to medicine for further monitoring and for improvement of medication regimen to control symptoms.    FINAL CLINICAL IMPRESSION(S) / ED DIAGNOSES    Final diagnoses:  Acute pulmonary edema (HCC)  Cardiomegaly   Rx / DC Orders   ED Discharge Orders     None      Note:  This document was prepared using Dragon voice recognition software and may include unintentional dictation errors.   Merwyn Katos, MD 12/12/22 [phone]

## 2022-12-12 NOTE — H&P (Addendum)
History and Physical    Patient: Whitney Knight NWG:[id number] DOB: [birth year] DOA: 12/12/2022 DOS: the patient was seen and examined on 12/12/2022 PCP: Glori Luis, MD  Patient coming from: Home  Chief Complaint:  Chief Complaint  Patient presents with   Shortness of Breath   HPI: Whitney Knight is a 72 y.o. female with medical history significant of hypertension, type 2 diabetes, hyperlipidemia, osteoporosis, hypothyroidism, who presents to the ED due to shortness of breath.  Whitney Knight states she began to feel ill around August 3rd, with generalized malaise. She subsequently developed a sore throat and due to this, went to an urgent care.  She was told that she was negative for group A strep.  Then over the last 1 to 2 days, she has developed rapidly onset abdominal distention, lower extremity swelling, orthopnea, and shortness of breath.  She endorses diarrhea yesterday, but states it was after taking a laxative. No abdominal pain at this time. No prior history of heart failure.  She notes that she lost approximately 40 pounds intentionally and it seems that over the last 1 to 2 days, all the weight suddenly came back on.  Patient notes that she has been on a clean diet regimen. She drinks 3-4 packs of electrolytes supplements per day. On review of packets, total sodium = 855 mg per drink. We discussed this is very high salt intake.   ED Course:  On arrival to the ED, patient was hypertensive at 151/80 with heart rate of 97.  She was saturating at 97% on room air.  She was afebrile at 97.6. Workup was obtained at PCPs office today, notable for WBC of 13.6, sodium of 123, glucose of 175, creatinine 0.95 GFR above 60 and iron saturation of 12.3.  proBNP was obtained that was markedly elevated at 1663.  Cova-19 PCR negative.  Chest x-ray was obtained that demonstrated new asymmetric fullness of the right hilum and bilateral pleural effusions.  CTA was obtained that was negative for  PE, however bilateral small to moderate pleural effusions with diffuse interstitial edema and atelectasis/consolidation of the right middle and right lower lobes.  Several foci of gas were seen in the hepatic lobe, however given lack of any abdominal symptoms, further workup was not pursued.  Cardiology was consulted.  TRH contacted for admission.  Review of Systems: As mentioned in the history of present illness. All other systems reviewed and are negative.  Past Medical History:  Diagnosis Date   Allergy    Anemia    Arthritis    KNEES   Cataract    Chronic sinusitis    Cough    Diabetes mellitus    Diverticulosis    GERD (gastroesophageal reflux disease)    Headache(784.0)    [facility] in past   Hypertension    Osteoporosis    Thyroid disease    Past Surgical History:  Procedure Laterality Date   bone graft     for dental surgery   CARPAL TUNNEL RELEASE Right 02/27/2013   [facility]   COLONOSCOPY  2014   dental implants     DENTAL SURGERY     DENTAL SURGERY     dental implant   NASAL SINUS SURGERY  06/2008   Dr. Jenne Campus   TRIGGER FINGER RELEASE Right 02/27/2013   [facility]   UPPER GASTROINTESTINAL ENDOSCOPY     VAGINAL DELIVERY     2   Social History:  reports that she has never smoked. She has never  used smokeless tobacco. She reports current alcohol use. She reports that she does not use drugs.  Allergies  Allergen Reactions   Clarithromycin     REACTION: Nausea   Jardiance [Empagliflozin] Nausea And Vomiting   Penicillins     REACTION: Throat swelling    Family History  Problem Relation Age of Onset   COPD Father    Hypertension Father    Heart disease Father    Heart disease Mother    Diabetes Maternal Grandmother    Colon cancer Neg Hx    Esophageal cancer Neg Hx    Pancreatic cancer Neg Hx    Stomach cancer Neg Hx    Liver disease Neg Hx     Prior to Admission medications   Medication Sig Start Date End Date Taking? Authorizing Provider   albuterol (VENTOLIN HFA) 108 (90 Base) MCG/ACT inhaler Inhale 2 puffs into the lungs every 6 (six) hours as needed for wheezing or shortness of breath. 12/12/22   Dana Allan, MD  amLODipine (NORVASC) 5 MG tablet Take 1 tablet (5 mg total) by mouth daily. 08/21/22   Glori Luis, MD  Betamethasone Valerate 0.12 % foam Apply to affected area once daily for up to 7 days Patient not taking: Reported on 10/10/2022 04/05/17   Glori Luis, MD  blood glucose meter kit and supplies KIT One touch ultra mini. Use 2-4 times per day. 05/19/22   Glori Luis, MD  cholecalciferol (VITAMIN D) 1000 UNITS tablet Take 1,000 Units by mouth daily.  Patient not taking: Reported on 12/12/2022    [provider]  Continuous Glucose Receiver (DEXCOM G7 RECEIVER) DEVI Use to check glucose at least every 8 hours. 11/10/22 11/06/23  Glori Luis, MD  Continuous Glucose Sensor (DEXCOM G7 SENSOR) MISC Apply every 10 days 12/06/22   Glori Luis, MD  doxycycline (VIBRA-TABS) 100 MG tablet Take 1 tablet (100 mg total) by mouth 2 (two) times daily for 5 days. 12/12/22 12/17/22  Dana Allan, MD  fluticasone Aleda Grana) 50 MCG/ACT nasal spray USE TWO SPRAYS IN [facility] NOSTRIL DAILY 11/10/22   Glori Luis, MD  glimepiride (AMARYL) 2 MG tablet TAKE 1 TABLET BY MOUTH ONCE DAILY WITH BREAKFAST 07/17/22   Glori Luis, MD  hydrochlorothiazide (MICROZIDE) 12.5 MG capsule TAKE 1 CAPSULE BY MOUTH ONCE DAILY 12/28/21   Glori Luis, MD  Lancets [facility] DELICA PLUS [place]) MISC USE ONCE DAILY AS DIRECTED 03/02/20   Glori Luis, MD  levothyroxine (SYNTHROID) 75 MCG tablet TAKE 1 TABLET BY MOUTH DAILY 08/10/22   Glori Luis, MD  Magnesium 250 MG TABS Take 1 tablet by mouth daily.    [provider]  Multiple Vitamins-Minerals (WOMENS MULTIVITAMIN PO) Take 1 tablet by mouth in the morning and at bedtime.  Patient not taking: Reported on 12/12/2022    [provider]   Omega-3 Fatty Acids (FISH OIL) 500 MG CAPS Take 500 tablets by mouth. Patient not taking: Reported on 12/12/2022    [provider]  [facility] ULTRA test strip USE TO CHECK BLOOD SUGAR UP TO THREE TIMES DAILY 07/25/22   Glori Luis, MD  Potassium 99 MG TABS Take 1 tablet by mouth daily. OTC    [provider]  saccharomyces boulardii (FLORASTOR) 250 MG capsule Take 1 capsule (250 mg total) by mouth daily. 12/12/22   Dana Allan, MD  tirzepatide [facility]) 15 MG/0.5ML Pen Inject 15 mg into the skin once a week. 06/13/22  Glori Luis, MD  Zoledronic Acid (RECLAST IV) Inject into the vein. Once a year Patient not taking: Reported on 12/12/2022    [provider]    Physical Exam: Vitals:   12/12/22 1659 12/12/22 1744  BP: (!) 151/80   Pulse: 97   Resp: 18   Temp: 97.6 F (36.4 C)   TempSrc: Oral   SpO2: 97%   Weight:  67.3 kg  Height:  5\' 4"  (1.626 m)   Physical Exam Vitals and nursing note reviewed.  Constitutional:      General: She is not in acute distress. HENT:     Head: Normocephalic and atraumatic.  Eyes:     Extraocular Movements: Extraocular movements intact.     Pupils: Pupils are equal, round, and reactive to light.  Neck:     Vascular: Hepatojugular reflux and JVD present.  Cardiovascular:     Rate and Rhythm: Normal rate and regular rhythm.     Heart sounds: No murmur heard. Pulmonary:     Effort: Pulmonary effort is normal. Tachypnea (with speaking) present. No respiratory distress.     Breath sounds: Rales (bilaterally up to mid lung fields) present. No decreased breath sounds.  Abdominal:     Palpations: Abdomen is soft. There is no mass.     Tenderness: There is no abdominal tenderness. There is no guarding.  Musculoskeletal:     Right lower leg: 1+ Pitting Edema present.     Left lower leg: 1+ Pitting Edema present.  Skin:    General: Skin is warm and dry.  Neurological:     General: No focal deficit present.      Mental Status: She is alert and oriented to person, place, and time.  Psychiatric:        Mood and Affect: Mood normal.        Behavior: Behavior normal.    Data Reviewed: CBC with WBC of 13 with 6, hemoglobin 12.2, platelets 05/03/1950 BMP with sodium of 123, potassium 2.7, bicarb 24, glucose 175, BUN 17, creatinine 0.95 with GFR of 60. BNP elevated 1663 COVID-19 PCR negative Iron panel with iron level of 33, TIBC of 268 saturation of 12.3  EKG with personally reviewed.  Sinus rhythm with rate of 95.  Right axis deviation.  Left bundle branch block.  No prior EKG to compare to.  CT Angio Chest PE W/Cm &/Or Wo Cm  Result Date: 12/12/2022 CLINICAL DATA:  Rule out pulmonary embolism.  Shortness of breath. EXAM: CT ANGIOGRAPHY CHEST WITH CONTRAST TECHNIQUE: Multidetector CT imaging of the chest was performed using the standard protocol during bolus administration of intravenous contrast. Multiplanar CT image reconstructions and MIPs were obtained to evaluate the vascular anatomy. RADIATION DOSE REDUCTION: This exam was performed according to the departmental dose-optimization program which includes automated exposure control, adjustment of the mA and/or kV according to patient size and/or use of iterative reconstruction technique. CONTRAST:  75mL OMNIPAQUE IOHEXOL 350 MG/ML SOLN COMPARISON:  None Available. FINDINGS: Cardiovascular: Satisfactory opacification of the pulmonary arteries to the segmental level. No evidence of pulmonary embolism. Normal heart size. No pericardial effusion. Mild aortic atherosclerosis and coronary artery calcification. No pericardial effusion. Mediastinum/Nodes: Thyroid gland and trachea appear normal. Unremarkable appearance of the esophagus. No mediastinal or hilar adenopathy. Lungs/Pleura: Small to moderate bilateral pleural effusions. Atelectasis and airspace consolidation noted in the right middle lobe and right lower lobe. Signs of interstitial edema with interlobular  septal thickening and diffuse hazy ground-glass attenuation is noted bilaterally. Areas  of subsegmental atelectasis are identified within the lingula and posterior left lower lobe. Upper Abdomen: Several foci of gas are identified overlying the anterolateral margin of the right hepatic lobe for which pneumoperitoneum cannot be excluded, image 149/4. Musculoskeletal: Multiple healed right rib fractures identified. No acute or suspicious osseous findings. Review of the MIP images confirms the above findings. IMPRESSION: 1. No evidence for acute pulmonary embolus. 2. Small to moderate bilateral pleural effusions. Diffuse interstitial edema is identified. Correlate for signs/symptoms of congestive heart failure. 3. Atelectasis and airspace consolidation noted in the right middle lobe and right lower lobe. 4. Several foci of gas are identified overlying the anterolateral margin of the right hepatic lobe for which pneumoperitoneum cannot be excluded. 5. Coronary artery calcification. 6.  Aortic Atherosclerosis (ICD10-I70.0). Critical Value/emergent results were called by telephone at the time of interpretation on 12/12/2022 at 6:53 pm to provider [facility] , who verbally acknowledged these results. Electronically Signed   By: Signa Kell M.D.   On: 12/12/2022 18:53   DG Chest 2 View  Addendum Date: 12/12/2022   ADDENDUM REPORT: 12/12/2022 16:09 ADDENDUM: Corrected report: Original report was generated with an error due to voice recognition software, corrected as follows: Comparison dated chest x-ray dated May 15, 2016, rib series dated May 15, 2017 Electronically Signed   By: Allegra Lai M.D.   On: 12/12/2022 16:09   Result Date: 12/12/2022 CLINICAL DATA:  Shortness of breath EXAM: CHEST - 2 VIEW COMPARISON:  Chest x-ray dated May 15, 2026 FINDINGS: New asymmetric fullness of the right hilum. Normal heart size. Small right-greater-than-left pleural effusions. No evidence of pneumothorax. Old  left-sided rib fractures. IMPRESSION: 1. New asymmetric fullness of the right hilum. Recommend contrast-enhanced chest CT to exclude right hilar mass. 2. Small right-greater-than-left pleural effusions. Electronically Signed: By: Allegra Lai M.D. On: 12/12/2022 15:22    Results are pending, will review when available.  Assessment and Plan:  * Acute heart failure (HCC) Patient is presenting with shortness of breath, orthopnea with imaging evidence of bilateral pleural effusions, profound interstitial edema, and markedly elevated BNP at 1600.  No prior history of heart failure.  Differential on etiology is broad at this time.  Patient does have a history of hypertension, type 2 diabetes and hyperlipidemia placing her at risk for heart failure, however she recently was suspected to have a viral illness that may be contributing.  - Cardiology consulted; appreciate their recommendations - Echocardiogram ordered - Telemetry monitoring - Lasix 40 mg IV twice daily - TSH - Troponin - Respiratory Viral Panel - Daily weights - Strict in and out  Hyponatremia Most likely secondary to hypervolemia and underlying new onset heart failure  - Lasix as noted above - Urine osm, urine sodium and serum osm pending - BMP every 6-8 hours - Goal sodium over the next 24 hours = 129 - 131  Gas foci overlying anterolateral margin of the right hepatic lobe On CTA, there was evidence of gas in the anterior lateral margin of the right hepatic lobe.  Patient declines any abdominal symptoms at this time, including abdominal pain and examination is reassuring.  Unclear significance of this.  EDP discussed with radiology team, who is recommending repeat abdominal x-ray tomorrow unless patient becomes symptomatic, then will need CT of the abdomen  - KUB ordered for tomorrow  Iron deficiency anemia Iron obtained at PCPs office with evidence of iron deficiency anemia.  Will start daily oral supplementation and  defer further workup to PCP.  -  Daily polysaccharide iron supplement  Uncontrolled type 2 diabetes mellitus with hyperglycemia (HCC) - Hold home glimepiride, and Mounjaro - SSI, moderate - Semglee 5 units at bedtime  Hypertension - Hold home hydrochlorothiazide in the setting of IV diuresis - Continue home amlodipine  Hypothyroidism - TSH pending - Continue home Synthroid  Advance Care Planning:   Code Status: Full Code verified by patient  Consults: Cardiology [facility])  Family Communication: Patient's son updated at bedside.   Severity of Illness: The appropriate patient status for this patient is INPATIENT. Inpatient status is judged to be reasonable and necessary in order to provide the required intensity of service to ensure the patient's safety. The patient's presenting symptoms, physical exam findings, and initial radiographic and laboratory data in the context of their chronic comorbidities is felt to place them at high risk for further clinical deterioration. Furthermore, it is not anticipated that the patient will be medically stable for discharge from the hospital within 2 midnights of admission.   * I certify that at the point of admission it is my clinical judgment that the patient will require inpatient hospital care spanning beyond 2 midnights from the point of admission due to high intensity of service, high risk for further deterioration and high frequency of surveillance required.*  Author: Verdene Lennert, MD 12/12/2022 8:35 PM  For on call review [email or web address].

## 2022-12-12 NOTE — Progress Notes (Unsigned)
SUBJECTIVE:   Chief Complaint  Patient presents with   Breathing Problem    Since Aug 3    HPI Presents for acute visit  Shortness of breath Went to [facility] Aug [facility] for symptoms of cough, shortness of breath and was told had viral infection. Reports had Tmax at that time 99.  Has had not fevers since that time.  Today she presents to clinic with worsening shortness of breath and productive cough with yellow phlegm.  Has been sleeping in recliner as she cannot lay flat.  Denies any chest pain, heart palpitations, weight gain or lower extremity edema.  Endorses wheezing at times.  Hydrating well and no decrease in appetite.  No tobacco use and no exposure to tobacco.  No recent travel.  Does not have any pets or exotic animals.  Reports history of bronchitis in past for which she had been treated with steroids and antibiotics.    PERTINENT PMH / PSH: DM Type 2 HTN  OBJECTIVE:  BP 126/74   Pulse 99   Temp 98.3 F (36.8 C)   Resp 16   Ht 5\' 4"  (1.626 m)   Wt 148 lb 6 oz (67.3 kg)   SpO2 99%   BMI 25.47 kg/m    Physical Exam Vitals reviewed.  Constitutional:      General: She is in acute distress.     Appearance: She is normal weight. She is ill-appearing.  HENT:     Head: Normocephalic.     Right Ear: Tympanic membrane, ear canal and external ear normal.     Left Ear: Tympanic membrane, ear canal and external ear normal.     Nose: Nose normal.     Mouth/Throat:     Mouth: Mucous membranes are moist.  Eyes:     Conjunctiva/sclera: Conjunctivae normal.  Cardiovascular:     Rate and Rhythm: Normal rate and regular rhythm.     Pulses: Normal pulses.     Heart sounds: Normal heart sounds.  Pulmonary:     Breath sounds: Decreased breath sounds: diminished breath sounds bilateral lower lobes.  Neurological:     Mental Status: She is alert.        10/10/2022   11:08 AM 08/21/2022   10:58 AM 01/11/2022    2:48 PM 09/21/2021    2:13 PM 07/04/2021   11:52 AM  Depression  screen PHQ 2/9  Decreased Interest 0 0 0 0 0  Down, Depressed, Hopeless 0 0 0 0 0  PHQ - 2 Score 0 0 0 0 0  Altered sleeping 0 0     Tired, decreased energy 0 0     Change in appetite 0 0     Feeling bad or failure about yourself  0 0     Trouble concentrating 0 0     Moving slowly or fidgety/restless 0 0     Suicidal thoughts 0 0     PHQ-9 Score 0 0     Difficult doing work/chores Not difficult at all Not difficult at all         08/21/2022   10:58 AM  GAD 7 : Generalized Anxiety Score  Nervous, Anxious, on Edge 0  Control/stop worrying 0  Worry too much - different things 0  Trouble relaxing 0  Restless 0  Easily annoyed or irritable 0  Afraid - awful might happen 0  Total GAD 7 Score 0  Anxiety Difficulty Not difficult at all    ASSESSMENT/PLAN:  [employer]  acquired pneumonia, unspecified laterality -     Doxycycline Hyclate; Take 1 tablet (100 mg total) by mouth 2 (two) times daily for 5 days.  Dispense: 10 tablet; Refill: 0 -     Saccharomyces boulardii; Take 1 capsule (250 mg total) by mouth daily.  Dispense: 90 capsule; Refill: 0  Shortness of breath -     CBC -     Brain natriuretic peptide -     Basic metabolic panel -     DG Chest 2 View; Future -     IBC + Ferritin -     POC COVID-19 BinaxNow -     Doxycycline Hyclate; Take 1 tablet (100 mg total) by mouth 2 (two) times daily for 5 days.  Dispense: 10 tablet; Refill: 0 -     Saccharomyces boulardii; Take 1 capsule (250 mg total) by mouth daily.  Dispense: 90 capsule; Refill: 0 -     Albuterol Sulfate HFA; Inhale 2 puffs into the lungs every 6 (six) hours as needed for wheezing or shortness of breath.  Dispense: 8 g; Refill: 0  Orthopnea Assessment & Plan: SOB, no signs of fluid overload on exam however has large pannus. Check BNP Consider ECHO if no improvement  Orders: -     CBC -     Brain natriuretic peptide -     Basic metabolic panel -     DG Chest 2 View; Future -     IBC + Ferritin   PDMP  reviewed  Return if symptoms worsen or fail to improve, for PCP.  Dana Allan, MD   Addendum 1600 Chest xray reports shows right hilar mass.  Recommend CT for further evaluation.  Patient notified to follow up in am for discussion of results.  1645 Reviewed today's labs.  Notable for hyponatremia, hypochloremia, leukocytosis, elevated BNP >1600.  Patient notified to go to ED for further evaluation for concern for abnormal chest xray and blood work. ED Triage notified that patient will be arriving from home.

## 2022-12-12 NOTE — Assessment & Plan Note (Addendum)
Patient is presenting with shortness of breath, orthopnea with imaging evidence of bilateral pleural effusions, profound interstitial edema, and markedly elevated BNP at 1600.  No prior history of heart failure.  Differential on etiology is broad at this time.  Patient does have a history of hypertension, type 2 diabetes and hyperlipidemia placing her at risk for heart failure, however she recently was suspected to have a viral illness that may be contributing.  - Cardiology consulted; appreciate their recommendations - Echocardiogram ordered - Telemetry monitoring - Lasix 40 mg IV twice daily - TSH - Troponin - Respiratory Viral Panel - Daily weights - Strict in and out

## 2022-12-12 NOTE — Assessment & Plan Note (Signed)
Iron obtained at PCPs office with evidence of iron deficiency anemia.  Will start daily oral supplementation and defer further workup to PCP.  - Daily polysaccharide iron supplement

## 2022-12-12 NOTE — ED Triage Notes (Addendum)
Pt presents to ED with c/o of SOB, pt states she was sent here for further eval of "my exam at my MD's". D/C paper show possible bilateral effusions and it appears pt may need a contrast CT to rule out other Dx's.   Pt states they "told me my BNP was high and my sodium was low". Pt speaking in full sentences.

## 2022-12-12 NOTE — Assessment & Plan Note (Signed)
-   Hold home glimepiride, and Mounjaro - SSI, moderate - Semglee 5 units at bedtime

## 2022-12-12 NOTE — Assessment & Plan Note (Signed)
On CTA, there was evidence of gas in the anterior lateral margin of the right hepatic lobe.  Patient declines any abdominal symptoms at this time, including abdominal pain and examination is reassuring.  Unclear significance of this.  EDP discussed with radiology team, who is recommending repeat abdominal x-ray tomorrow unless patient becomes symptomatic, then will need CT of the abdomen  - KUB ordered for tomorrow

## 2022-12-12 NOTE — Patient Instructions (Addendum)
It was a pleasure meeting you today. Thank you for allowing me to take part in your health care.  Our goals for today as we discussed include:  Go to [address] [facility] [address]  [place], [place] [phone]    Start Doxycycline 100 mg two times a day for 5 days Take Probiotics daily and continue for 14 days after treatment Use Albuterol Inhaler every 4-6 hours for shortness of breath or wheezing  We will get some labs today.  If they are abnormal or we need to do something about them, I will call you.  If they are normal, I will send you a message on MyChart (if it is active) or a letter in the mail.  If you don't hear from [place] in 2 weeks, please call the office at the number below.    If you have any questions or concerns, please do not hesitate to call the office at [phone].  I look forward to our next visit and until then take care and stay safe.  Regards,   Dana Allan, MD   [facility]

## 2022-12-12 NOTE — Progress Notes (Signed)
  Progress Note   Notified of elevated troponin at 1790.  Discussed with Dr. Gala Romney regarding utility of heparin infusion.  Given the high likelihood that this is a viral myocarditis, there is no indication to start heparin infusion at this time given absence of chest pain.  Will continue to trend and monitor for any development of chest pain.  - Continue to trend troponin - Stat EKG for any chest pain - Start losartan 25 mg daily  Author: Verdene Lennert, MD 12/12/2022 9:56 PM  For on call review [email or web address].

## 2022-12-13 ENCOUNTER — Inpatient Hospital Stay: Payer: Medicare Other

## 2022-12-13 ENCOUNTER — Telehealth: Payer: Medicare Other | Admitting: Family Medicine

## 2022-12-13 ENCOUNTER — Encounter: Payer: Self-pay | Admitting: Internal Medicine

## 2022-12-13 ENCOUNTER — Encounter: Payer: Self-pay | Admitting: Family Medicine

## 2022-12-13 ENCOUNTER — Inpatient Hospital Stay (HOSPITAL_COMMUNITY)
Admit: 2022-12-13 | Discharge: 2022-12-13 | Disposition: A | Discharge status: Home / Self Care | Payer: Medicare Other | Attending: Cardiovascular Disease | Admitting: Cardiovascular Disease

## 2022-12-13 ENCOUNTER — Other Ambulatory Visit (HOSPITAL_COMMUNITY): Payer: Self-pay

## 2022-12-13 DIAGNOSIS — R0602 Shortness of breath: Secondary | ICD-10-CM

## 2022-12-13 DIAGNOSIS — I5021 Acute systolic (congestive) heart failure: Secondary | ICD-10-CM | POA: Diagnosis not present

## 2022-12-13 DIAGNOSIS — J189 Pneumonia, unspecified organism: Secondary | ICD-10-CM | POA: Insufficient documentation

## 2022-12-13 DIAGNOSIS — I509 Heart failure, unspecified: Secondary | ICD-10-CM

## 2022-12-13 DIAGNOSIS — R0601 Orthopnea: Secondary | ICD-10-CM | POA: Insufficient documentation

## 2022-12-13 HISTORY — DX: Shortness of breath: R06.02

## 2022-12-13 LAB — RESPIRATORY PANEL BY PCR

## 2022-12-13 LAB — TROPONIN I (HIGH SENSITIVITY)
Troponin I (High Sensitivity): 2024 ng/L (ref <18)
Troponin I (High Sensitivity): 1737 ng/L (ref <18)

## 2022-12-13 LAB — GLUCOSE, CAPILLARY
Glucose-Capillary: 127 mg/dL — ABNORMAL HIGH (ref 70–99)
Glucose-Capillary: 87 mg/dL (ref 70–99)

## 2022-12-13 LAB — SARS CORONAVIRUS 2 BY RT PCR: SARS Coronavirus 2 by RT PCR: NEGATIVE

## 2022-12-13 MED ORDER — SODIUM CHLORIDE 0.9 % IV SOLN: INTRAVENOUS | Status: DC

## 2022-12-13 MED ORDER — ASPIRIN 81 MG PO CHEW
81.0000 mg | CHEWABLE_TABLET | ORAL | Status: AC
  Administered 2022-12-14: 81 mg via ORAL
  Filled 2022-12-13: qty 1

## 2022-12-13 MED ORDER — ASPIRIN 81 MG PO TBEC
81.0000 mg | DELAYED_RELEASE_TABLET | Freq: Every day | ORAL | Status: DC
  Administered 2022-12-14 – 2022-12-16 (×3): 81 mg via ORAL
  Filled 2022-12-13 (×3): qty 1

## 2022-12-13 MED ORDER — LOSARTAN POTASSIUM 25 MG PO TABS
12.5000 mg | ORAL_TABLET | Freq: Every day | ORAL | Status: DC
  Filled 2022-12-13 (×2): qty 1

## 2022-12-13 MED ORDER — ASPIRIN 81 MG PO CHEW
324.0000 mg | CHEWABLE_TABLET | Freq: Once | ORAL | Status: AC
  Administered 2022-12-13: 324 mg via ORAL
  Filled 2022-12-13: qty 4

## 2022-12-13 MED ORDER — ROSUVASTATIN CALCIUM 10 MG PO TABS
20.0000 mg | ORAL_TABLET | Freq: Every day | ORAL | Status: DC
  Filled 2022-12-13 (×4): qty 2

## 2022-12-13 MED ORDER — PERFLUTREN LIPID MICROSPHERE
1.0000 mL | INTRAVENOUS | Status: AC | PRN
  Administered 2022-12-13: 5 mL via INTRAVENOUS

## 2022-12-13 MED ORDER — FLUTICASONE PROPIONATE 50 MCG/ACT NA SUSP
2.0000 | Freq: Every day | NASAL | Status: DC
  Administered 2022-12-13 – 2022-12-16 (×4): 2 via NASAL
  Filled 2022-12-13: qty 16

## 2022-12-13 NOTE — Progress Notes (Signed)
*  PRELIMINARY RESULTS* Echocardiogram 2D Echocardiogram has been performed.  Carolyne Fiscal 12/13/2022, 10:08 AM

## 2022-12-13 NOTE — Consult Note (Addendum)
Cardiology Consultation   Patient ID: Whitney Knight MRN: [id number]; DOB: 1951-03-20  Admit date: 12/12/2022 Date of Consult: 12/13/2022  PCP:  Glori Luis, MD   [facility] Providers Cardiologist:  None      New consult completed by Dr. Okey Dupre  Patient Profile:   Whitney Knight is a 72 y.o. female with a hx of hypertension, type 2 diabetes, hyperlipidemia, osteoporosis, hypothyroidism who is being seen 12/13/2022 for the evaluation of shortness of breath at the request of Dr. Huel Cote.  History of Present Illness:   [name] presented to the [facility] emergency department on 12/16/2022 with complaints of shortness of breath from the urgent care clinic.  She was advised that her BNP was high and her sodium was low.  She was encouraged to follow-up in the emergency department with concerns for possible heart failure.  Patient denies any cardiac history and does not take any cardiovascular medications.  Patient stated she began to feel sick around August 3 with generalized fatigue.  She subsequently developed a sore throat and went to urgent care.  At that time she was told she was negative for group A strep.  Then over the last 1 to 2 days she developed rapid onset abdominal distention, lower extremity swelling, orthopnea, and shortness of breath.  She also started with diarrhea.  She denies any abdominal pain, chest pain, palpitations.  She has no prior history of heart failure.  She notes that she lost approximately 40 pounds over the last year but then gained 5 pounds overnight.  She does drink 3 to 4 packs of electrolyte supplements per day with a large sodium intake.  She also stated that she was at a large gathering at the beginning of August prior to becoming sick.  Stated that when her symptoms started she was running a low-grade fever but nothing that went above 99 and had some occasional chills.  Initial vital signs: Blood pressure 151/80, heart rate 97, respirations  18, temperature 97.6  Pertinent labs: WBCs 13.6, BNP 1663.0, sodium 423, potassium 3.7, chloride 89, glucose of 175, serum creatinine 0.95, BUN 17, iron 33, COVID PCR screen negative, high-sensitivity troponin 1308,6578, 2024, respiratory panel PCR negative for flu, COVID, rhinovirus, CRP 7.3  Imaging: Chest x-ray reveals new asymmetric fullness of the right ilium recommend contrast-enhanced chest CT to exclude right hilar mass, small right greater than left pleural effusion; CT angio of the chest negative for acute pulmonary emboli, small to moderate bilateral pleural effusions, diffuse interstitial edema, atelectasis and airspace consolidation in the right middle lobe and right lower lobe, several foci of gas identified overlying the anterior lateral margin of the right hepatic lobe, coronary artery calcification, aortic atherosclerosis  Medications administered in the emergency department: Furosemide 40 mg IVP, albuterol nebulizer, insulin 5 units subcutaneous, Lovenox 40 mg subcutaneous,  Cardiology was consulted for progressive shortness of breath and concern for acute heart failure exacerbation.   Past Medical History:  Diagnosis Date   Allergy    Anemia    Arthritis    KNEES   Cataract    Chronic sinusitis    Cough    Diabetes mellitus    Diverticulosis    GERD (gastroesophageal reflux disease)    Headache(784.0)    [facility] in past   Hypertension    Osteoporosis    Thyroid disease     Past Surgical History:  Procedure Laterality Date   bone graft     for dental surgery  CARPAL TUNNEL RELEASE Right 02/27/2013   [facility]   COLONOSCOPY  2014   dental implants     DENTAL SURGERY     DENTAL SURGERY     dental implant   NASAL SINUS SURGERY  06/2008   Dr. Jenne Campus   TRIGGER FINGER RELEASE Right 02/27/2013   [facility]   UPPER GASTROINTESTINAL ENDOSCOPY     VAGINAL DELIVERY     2     Home Medications:  Prior to Admission medications   Medication Sig Start Date End  Date Taking? Authorizing Provider  albuterol (VENTOLIN HFA) 108 (90 Base) MCG/ACT inhaler Inhale 2 puffs into the lungs every 6 (six) hours as needed for wheezing or shortness of breath. 12/12/22  Yes Dana Allan, MD  amLODipine (NORVASC) 5 MG tablet Take 1 tablet (5 mg total) by mouth daily. 08/21/22  Yes Glori Luis, MD  fluticasone [facility]) 50 MCG/ACT nasal spray USE TWO SPRAYS IN [facility] NOSTRIL DAILY 11/10/22  Yes Glori Luis, MD  glimepiride (AMARYL) 2 MG tablet TAKE 1 TABLET BY MOUTH ONCE DAILY WITH BREAKFAST 07/17/22  Yes Glori Luis, MD  hydrochlorothiazide (MICROZIDE) 12.5 MG capsule TAKE 1 CAPSULE BY MOUTH ONCE DAILY 12/28/21  Yes Glori Luis, MD  levothyroxine (SYNTHROID) 75 MCG tablet TAKE 1 TABLET BY MOUTH DAILY 08/10/22  Yes Glori Luis, MD  Magnesium 250 MG TABS Take 1 tablet by mouth daily.   Yes [provider]  Potassium 99 MG TABS Take 1 tablet by mouth daily. OTC   Yes [provider]  Betamethasone Valerate 0.12 % foam Apply to affected area once daily for up to 7 days Patient not taking: Reported on 10/10/2022 04/05/17   Glori Luis, MD  blood glucose meter kit and supplies KIT One touch ultra mini. Use 2-4 times per day. 05/19/22   Glori Luis, MD  cholecalciferol (VITAMIN D) 1000 UNITS tablet Take 1,000 Units by mouth daily.  Patient not taking: Reported on 12/12/2022    [provider]  Continuous Glucose Receiver (DEXCOM G7 RECEIVER) DEVI Use to check glucose at least every 8 hours. 11/10/22 11/06/23  Glori Luis, MD  Continuous Glucose Sensor (DEXCOM G7 SENSOR) MISC Apply every 10 days 12/06/22   Glori Luis, MD  doxycycline (VIBRA-TABS) 100 MG tablet Take 1 tablet (100 mg total) by mouth 2 (two) times daily for 5 days. 12/12/22 12/17/22  Dana Allan, MD  Lancets [facility] DELICA PLUS [place]) MISC USE ONCE DAILY AS DIRECTED 03/02/20   Glori Luis, MD  Multiple Vitamins-Minerals (WOMENS  MULTIVITAMIN PO) Take 1 tablet by mouth in the morning and at bedtime.  Patient not taking: Reported on 12/12/2022    [provider]  Omega-3 Fatty Acids (FISH OIL) 500 MG CAPS Take 500 tablets by mouth. Patient not taking: Reported on 12/12/2022    [provider]  [facility] ULTRA test strip USE TO CHECK BLOOD SUGAR UP TO THREE TIMES DAILY 07/25/22   Glori Luis, MD  saccharomyces boulardii (FLORASTOR) 250 MG capsule Take 1 capsule (250 mg total) by mouth daily. 12/12/22   Dana Allan, MD  tirzepatide [facility]) 15 MG/0.5ML Pen Inject 15 mg into the skin once a week. 06/13/22   Glori Luis, MD  Zoledronic Acid (RECLAST IV) Inject into the vein. Once a year Patient not taking: Reported on 12/12/2022    [provider]    Inpatient Medications: Scheduled Meds:  enoxaparin (LOVENOX) injection  40 mg Subcutaneous Q24H  furosemide  40 mg Intravenous BID   insulin aspart  0-15 Units Subcutaneous TID WC   insulin glargine-yfgn  5 Units Subcutaneous QHS   levothyroxine  75 mcg Oral Q0600   losartan  25 mg Oral Daily   sodium chloride flush  3 mL Intravenous Q12H   Continuous Infusions:  sodium chloride     PRN Meds: sodium chloride, acetaminophen, albuterol, ondansetron (ZOFRAN) IV, sodium chloride flush  Allergies:    Allergies  Allergen Reactions   Clarithromycin     REACTION: Nausea   Jardiance [Empagliflozin] Nausea And Vomiting   Penicillins     REACTION: Throat swelling    Social History:   Social History   Socioeconomic History   Marital status: Married    Spouse name: Not on file   Number of children: 2   Years of education: Not on file   Highest education level: Not on file  Occupational History   Occupation: [employer]    Employer: [employer]  Tobacco Use   Smoking status: Never   Smokeless tobacco: Never  Vaping Use   Vaping status: Never Used  Substance and Sexual Activity   Alcohol use: Yes    Comment: occ    Drug use: No   Sexual activity: Not on file  Other Topics Concern   Not on file  Social History Narrative   Lives with husband. No pets, 2 children.      Work - [employer]      Diet - regular   Exercise - none presently   Social Determinants of Health   Financial Resource Strain: Patient Declined (12/12/2022)   Overall Financial Resource Strain (CARDIA)    Difficulty of Paying Living Expenses: Patient declined  Food Insecurity: No Food Insecurity (12/13/2022)   Hunger Vital Sign    Worried About Running Out of Food in the Last Year: Never true    Ran Out of Food in the Last Year: Never true  Transportation Needs: No Transportation Needs (12/13/2022)   PRAPARE - Administrator, Civil Service (Medical): No    Lack of Transportation (Non-Medical): No  Physical Activity: Sufficiently Active (12/12/2022)   Exercise Vital Sign    Days of Exercise per Week: 7 days    Minutes of Exercise per Session: 30 min  Stress: No Stress Concern Present (12/12/2022)   [employer] of Occupational Health - Occupational Stress Questionnaire    Feeling of Stress : Not at all  Social Connections: Unknown (12/12/2022)   Social Connection and Isolation Panel [NHANES]    Frequency of Communication with Friends and Family: Patient declined    Frequency of Social Gatherings with Friends and Family: Patient declined    Attends Religious Services: Patient declined    Database administrator or Organizations: Patient declined    Attends Engineer, structural: More than 4 times per year    Marital Status: Married  Catering manager Violence: Not At Risk (12/13/2022)   Humiliation, Afraid, Rape, and Kick questionnaire    Fear of Current or Ex-Partner: No    Emotionally Abused: No    Physically Abused: No    Sexually Abused: No    Family History:    Family History  Problem Relation Age of Onset   COPD Father    Hypertension Father    Heart disease Father    Heart disease Mother     Diabetes Maternal Grandmother    Colon cancer Neg Hx    Esophageal cancer Neg Hx  Pancreatic cancer Neg Hx    Stomach cancer Neg Hx    Liver disease Neg Hx      ROS:  Please see the history of present illness.  Review of Systems  Constitutional:  Positive for malaise/fatigue.  Respiratory:  Positive for shortness of breath.   Cardiovascular:  Positive for orthopnea and leg swelling.  Gastrointestinal:  Positive for abdominal pain.  Neurological:  Positive for weakness.    All other ROS reviewed and negative.     Physical Exam/Data:   Vitals:   12/12/22 2100 12/12/22 2317 12/13/22 0323 12/13/22 0532  BP:  113/67 118/71   Pulse:  89 84   Resp:  16 18   Temp:  97.7 F (36.5 C) 98 F (36.7 C)   TempSrc:  Oral Oral   SpO2:  98% 97%   Weight: (!) 150 kg   67.2 kg  Height: 5\' 4"  (1.626 m)       Intake/Output Summary (Last 24 hours) at 12/13/2022 0741 Last data filed at 12/12/2022 2319 Gross per 24 hour  Intake 240 ml  Output 1000 ml  Net -760 ml      12/13/2022    5:32 AM 12/12/2022    9:00 PM 12/12/2022    5:44 PM  Last 3 Weights  Weight (lbs) 148 lb 2.4 oz 330 lb 11 oz 148 lb 5.9 oz  Weight (kg) 67.2 kg 150 kg 67.3 kg     Body mass index is 25.43 kg/m.  General:  Well nourished, well developed, in no acute distress HEENT: normal Neck: + JVD Vascular: No carotid bruits; Distal pulses 2+ bilaterally Cardiac:  normal S1, S2; RRR; no murmur  Lungs:  clear upper lobes with bibasilar crackles to auscultation bilaterally, respirations are unlabored on room air, nonproductive cough noted Abd: soft, nontender, no hepatomegaly  Ext: 1+ pitting edema to the BLE Musculoskeletal:  No deformities, BUE and BLE strength normal and equal Skin: warm and dry  Neuro:  CNs 2-12 intact, no focal abnormalities noted Psych:  Normal affect   EKG:  The EKG was personally reviewed and demonstrates: Sinus rhythm with a rate of 95 with left bundle branch block Telemetry:  Telemetry was  personally reviewed and demonstrates: Sinus rhythm with continued left BBB  Relevant CV Studies: Coronary CT 02/24/21 IMPRESSION AND RECOMMENDATION: 1. Coronary calcium score of 37.1. This was 64th percentile for age and sex matched control.   2. CAC 1-99 in LAD.  CAC-DRS A1/N1.   3. Continue heart healthy lifestyle and risk factor modification.   4. Consider statin therapy due to Age >71.  Laboratory Data:  High Sensitivity Troponin:   Recent Labs  Lab 12/12/22 1944 12/12/22 2316 12/13/22 0208  TROPONINIHS 1,790* 1,848* 2,024*     Chemistry Recent Labs  Lab 12/12/22 1229 12/12/22 2316 12/13/22 0208  NA 123* 126* 127*  K 3.7 3.8 3.5  CL 89* 91* 90*  CO2 24 23 26   GLUCOSE 175* 173* 134*  BUN 17 22 19   CREATININE 0.95 0.97 0.90  CALCIUM 8.8 8.6* 8.2*  MG  --   --  1.8  GFRNONAA  --  >60 >60  ANIONGAP  --  12 11    No results for input(s): "PROT", "ALBUMIN", "AST", "ALT", "ALKPHOS", "BILITOT" in the last 168 hours. Lipids No results for input(s): "CHOL", "TRIG", "HDL", "LABVLDL", "LDLCALC", "CHOLHDL" in the last 168 hours.  Hematology Recent Labs  Lab 12/12/22 1229 12/13/22 0208  WBC 13.6* 11.3*  RBC 4.35 4.24  HGB 12.2 11.9*  HCT 37.8 35.7*  MCV 86.9 84.2  MCH  --  28.1  MCHC 32.2 33.3  RDW 14.3 13.9  PLT 354.0 319   Thyroid  Recent Labs  Lab 12/12/22 1944  TSH 2.723    BNP Recent Labs  Lab 12/12/22 1229  PROBNP 1,663.0*    DDimer No results for input(s): "DDIMER" in the last 168 hours.   Radiology/Studies:  CT Angio Chest PE W/Cm &/Or Wo Cm  Result Date: 12/12/2022 CLINICAL DATA:  Rule out pulmonary embolism.  Shortness of breath. EXAM: CT ANGIOGRAPHY CHEST WITH CONTRAST TECHNIQUE: Multidetector CT imaging of the chest was performed using the standard protocol during bolus administration of intravenous contrast. Multiplanar CT image reconstructions and MIPs were obtained to evaluate the vascular anatomy. RADIATION DOSE REDUCTION: This exam  was performed according to the departmental dose-optimization program which includes automated exposure control, adjustment of the mA and/or kV according to patient size and/or use of iterative reconstruction technique. CONTRAST:  75mL OMNIPAQUE IOHEXOL 350 MG/ML SOLN COMPARISON:  None Available. FINDINGS: Cardiovascular: Satisfactory opacification of the pulmonary arteries to the segmental level. No evidence of pulmonary embolism. Normal heart size. No pericardial effusion. Mild aortic atherosclerosis and coronary artery calcification. No pericardial effusion. Mediastinum/Nodes: Thyroid gland and trachea appear normal. Unremarkable appearance of the esophagus. No mediastinal or hilar adenopathy. Lungs/Pleura: Small to moderate bilateral pleural effusions. Atelectasis and airspace consolidation noted in the right middle lobe and right lower lobe. Signs of interstitial edema with interlobular septal thickening and diffuse hazy ground-glass attenuation is noted bilaterally. Areas of subsegmental atelectasis are identified within the lingula and posterior left lower lobe. Upper Abdomen: Several foci of gas are identified overlying the anterolateral margin of the right hepatic lobe for which pneumoperitoneum cannot be excluded, image 149/4. Musculoskeletal: Multiple healed right rib fractures identified. No acute or suspicious osseous findings. Review of the MIP images confirms the above findings. IMPRESSION: 1. No evidence for acute pulmonary embolus. 2. Small to moderate bilateral pleural effusions. Diffuse interstitial edema is identified. Correlate for signs/symptoms of congestive heart failure. 3. Atelectasis and airspace consolidation noted in the right middle lobe and right lower lobe. 4. Several foci of gas are identified overlying the anterolateral margin of the right hepatic lobe for which pneumoperitoneum cannot be excluded. 5. Coronary artery calcification. 6.  Aortic Atherosclerosis (ICD10-I70.0). Critical  Value/emergent results were called by telephone at the time of interpretation on 12/12/2022 at 6:53 pm to provider [facility] , who verbally acknowledged these results. Electronically Signed   By: Signa Kell M.D.   On: 12/12/2022 18:53   DG Chest 2 View  Addendum Date: 12/12/2022   ADDENDUM REPORT: 12/12/2022 16:09 ADDENDUM: Corrected report: Original report was generated with an error due to voice recognition software, corrected as follows: Comparison dated chest x-ray dated May 15, 2016, rib series dated May 15, 2017 Electronically Signed   By: Allegra Lai M.D.   On: 12/12/2022 16:09   Result Date: 12/12/2022 CLINICAL DATA:  Shortness of breath EXAM: CHEST - 2 VIEW COMPARISON:  Chest x-ray dated May 15, 2026 FINDINGS: New asymmetric fullness of the right hilum. Normal heart size. Small right-greater-than-left pleural effusions. No evidence of pneumothorax. Old left-sided rib fractures. IMPRESSION: 1. New asymmetric fullness of the right hilum. Recommend contrast-enhanced chest CT to exclude right hilar mass. 2. Small right-greater-than-left pleural effusions. Electronically Signed: By: Allegra Lai M.D. On: 12/12/2022 15:22     Assessment and Plan:   Acute heart failure -Presented with  progressive shortness of breath and weight gain -Given furosemide in the emergency department --760 mL output since arrival -Echocardiogram ordered and pending with further recommendations to follow -Continued on furosemide 40 mg IV twice daily -Continued on losartan -BNP 1663 -Chest x-ray reveals small right greater than left pleural effusion, chest CT with small to moderate bilateral pleural effusions noted diffuse interstitial edema -Scheduled for [facility] tomorrow to further evaluate hemodynamics and to rule out ischemic cause of acute onset of heart failure -Heart failure education -Low-sodium diet, I's & O's, daily weights  Hyponatremia -Serum sodium 127 -Slightly  improving -Continue to monitor sodium levels frequently -Monitor/trend/replete electrolytes as needed -Limit free water intake  Elevated high-sensitivity troponin -Sensitivity troponins trended 1790, 1848, 2024 -Continue to trend until decline -Patient continues to remain chest pain-free -[facility] tomorrow for further evaluation, further recommendations to follow  Hypertension -Blood pressure 118/71 -Continue on losartan and furosemide -Vital signs per unit protocol  Hyperlipidemia -LDL 130 on 05/18/2022 -Patient previously been hesitant to start a statin therapy -Repeat lipid panel -Started on rosuvastatin  Type 2 diabetes -Continued on insulin therapy -Further management per IM  Hypothyroidism -TSH within normal limits -Continue home PTA Synthroid  Iron deficiency anemia -Continued on daily iron supplement -Daily CBC -continue management per IM  Informed Consent   Shared Decision Making/Informed Consent The risks [stroke (1 in 1000), death (1 in 1000), kidney failure [usually temporary] (1 in 500), bleeding (1 in 200), allergic reaction [possibly serious] (1 in 200)], benefits (diagnostic support and management of coronary artery disease) and alternatives of a cardiac catheterization were discussed in detail with Ms. Stamant and she is willing to proceed.     Risk Assessment/Risk Scores:        New York Heart Association (NYHA) Functional Class NYHA Class III        For questions or updates, please contact [facility] Please consult [email or web address] for contact info under    Signed, Tyanne Derocher, NP  12/13/2022 7:41 AM

## 2022-12-13 NOTE — Progress Notes (Signed)
[facility] HF Stewardship  PCP: Glori Luis, MD  PCP-Cardiologist: None  HPI: Whitney Knight is a 72 y.o. female with hypothyroidism, T2DM, HTN, and Raynaud's who presented with shortness of breath. Reported feeling general malaise & sore throat on 12/02/22 that progressed over the next several days to abdominal distension, LEE, orthopnea, and shortness of breath, and diarrhea. She was diagnosed with new onset CHF. Also found to have gas foci in right hepatic lobe. CTA 8/13 negative for PE. BNP 1663 on admission. Echocardiogram 12/13/22 showed LVEF of 30-35% with grade II diastolic dysfunction and normal RV function. Troponin elevated to 2024 on 12/13/22. R/LHC scheduled for 12/14/22. Negative for COVID-19 and RVP.   Pertinent Lab Values: BUN  Date Value Ref Range Status  12/13/2022 19 8 - 23 mg/dL Final  08/65/7846 19 8 - 27 mg/dL Final   Potassium  Date Value Ref Range Status  12/13/2022 3.5 3.5 - 5.1 mmol/L Final   Sodium  Date Value Ref Range Status  12/13/2022 127 (L) 135 - 145 mmol/L Final  05/04/2021 138 134 - 144 mmol/L Final   Magnesium  Date Value Ref Range Status  12/13/2022 1.8 1.7 - 2.4 mg/dL Final    Comment:    Performed at [facility], [address] [place], [place] [address]   TSH  Date Value Ref Range Status  12/12/2022 2.723 0.350 - 4.500 uIU/mL Final    Comment:    Performed by a 3rd Generation assay with a functional sensitivity of <=0.01 uIU/mL. Performed at [facility], [address] [place], [place] [address]   05/18/2022 2.01 0.35 - 5.50 uIU/mL Final    Vital Signs: Temp:  [97.6 F (36.4 C)-98.3 F (36.8 C)] 98.3 F (36.8 C) (08/14 1206) Pulse Rate:  [84-97] 85 (08/14 1206) Cardiac Rhythm: Normal sinus rhythm (08/14 0900) Resp:  [16-18] 18 (08/14 1206) BP: (108-151)/(62-81) 111/66 (08/14 1206) SpO2:  [97 %-99 %] 98 % (08/14 1206) Weight:  [67.2 kg (148 lb 2.4 oz)-150 kg (330 lb 11 oz)] 67.2 kg (148 lb 2.4 oz) (08/14  0532)   Intake/Output Summary (Last 24 hours) at 12/13/2022 1355 Last data filed at 12/12/2022 2319 Gross per 24 hour  Intake 240 ml  Output 1000 ml  Net -760 ml    Current Inpatient Medications:  Loop Diuretic: Furosemide 40 mg IV BID ACE/ARB/ARNI: Losartan 12.5 mg daily  Prior to admission HF Medications:  None  Assessment: 1. Acute heart failure (LVEF 30-35%), due to unknown etiology. NYHA class II-III symptoms.  -BP stable. Creatinine ok. Hyponatremia likely secondary to volume overload.   Plan: 1) Medication changes recommended at this time: -None. Will follow after cath to adjust medications further. -Maintain strict I/Os, daily weights, Mg >2, and K >4. Consider potassium 40 meq PO and Magnesium 2g IV.  2) Patient assistance: Marcelline Deist copay $146.48 Jardiance copay $153.73  3) Education: -To be completed prior to discharge.  Medication Assistance / Insurance Benefits Check:  Does the patient have prescription insurance? Prescription Insurance: Medicare ([employer])  Type of insurance plan:   Does the patient qualify for medication assistance through manufacturers or grants? Pending   Eligible grants and/or patient assistance programs:Ppending   Medication assistance applications in progress: Pending   Medication assistance applications approved: Pending  Approved medication assistance renewals will be completed by: Pending  Outpatient Pharmacy:  [facility]        Thank you for involving pharmacy in this patient's care.  Enos Fling, PharmD, BCPS Clinical Pharmacist 12/13/2022 2:12 PM

## 2022-12-13 NOTE — Progress Notes (Signed)
PT Cancellation Note  Patient Details Name: Whitney Knight MRN: [id number] DOB: Jun 08, 1950   Cancelled Treatment:    Reason Eval/Treat Not Completed: Medical issues which prohibited therapy (Consult received and chart reviewed.  Per notes, patient with elevated troponin, pending cardiac cath next date.  Will continue to follow and initiate evaluation post-procedure as appropriate.)   Latoia Eyster H. Manson Passey, PT, DPT, NCS 12/13/22, 5:38 PM [phone]

## 2022-12-13 NOTE — Plan of Care (Signed)

## 2022-12-13 NOTE — TOC Benefit Eligibility Note (Signed)
Patient Product/process development scientist completed.    The patient is insured through [facility]. Patient has Medicare and is not eligible for a copay card, but may be able to apply for patient assistance, if available.    Ran test claim for Eliquis 5 mg and the current 30 day co-pay is $173.05.  Ran test claim for Farxiga 10 mg and the current 30 day co-pay is $146.48.  Ran test claim for Jardiance 10 mg and the current 30 day co-pay is $153.73.   This test claim was processed through [facility]- copay amounts may vary at other pharmacies due to pharmacy/plan contracts, or as the patient moves through the different stages of their insurance plan.     Roland Earl, CPHT Pharmacy Patient Advocate Specialist [facility] Patient Advocate Team Direct Number: [phone]  Fax: [phone]

## 2022-12-13 NOTE — Progress Notes (Signed)
PROGRESS NOTE    KATRINE BEYE  ZDG:[id number] DOB: 02-06-1951 DOA: 12/12/2022 PCP: Glori Luis, MD     Brief Narrative:   From admission h and p   Whitney Knight is a 72 y.o. female with medical history significant of hypertension, type 2 diabetes, hyperlipidemia, osteoporosis, hypothyroidism, who presents to the ED due to shortness of breath.   Mrs. Coffey states she began to feel ill around August 3rd, with generalized malaise. She subsequently developed a sore throat and due to this, went to an urgent care.  She was told that she was negative for group A strep.  Then over the last 1 to 2 days, she has developed rapidly onset abdominal distention, lower extremity swelling, orthopnea, and shortness of breath.  She endorses diarrhea yesterday, but states it was after taking a laxative. No abdominal pain at this time. No prior history of heart failure.  She notes that she lost approximately 40 pounds intentionally and it seems that over the last 1 to 2 days, all the weight suddenly came back on.   Patient notes that she has been on a clean diet regimen. She drinks 3-4 packs of electrolytes supplements per day. On review of packets, total sodium = 855 mg per drink. We discussed this is very high salt intake.    Assessment & Plan:   Principal Problem:   Acute heart failure (HCC) Active Problems:   Hyponatremia   Gas foci overlying anterolateral margin of the right hepatic lobe   Hypothyroidism   Hypertension   Uncontrolled type 2 diabetes mellitus with hyperglycemia (HCC)   Iron deficiency anemia  # Acute CHF Several days swelling, bnp markedly elevated, pulm edema on CTA. No PE. Covid and RVP are negative. Possibly viral cause as recent viral symptoms. Has responded well to lasix thus far. - continue lasix 40 IV bid - TTE pending - strict I/os - f/u cardiology recs  # Tropinemia Trops markedly elevated to 2000 though no chest and no overt ischemic changes on EKG -  will give aspirin but holding on heparin - f/u TTE - cardiology to see  # Hyponatremia Likely hypervolemic from chf. Improved to 127 today from 123 on presentation - continue diuresis and monitor  # Gas foci overlying anterolateral margin of the right hepatic lobe Per admitting provider, "EDP discussed with radiology team, who is recommending repeat abdominal x-ray tomorrow unless patient becomes symptomatic, then will need CT of the abdomen." Continues to deny abdominal pain, no abdominal tenderness - f/u KUB  # Hypothyroid Tsh wnl - cont home synthroid  # T2DM Euglycemic - holding home glimeperide, mounjaro - continue SSI  # HTN Here normotensive in setting of diuresis - cont to hold home hydrochlorothiazide and amlodipine  DVT prophylaxis: lovenox Code Status: full Family Communication: husband updated @ bedside 8/14  Level of care: Telemetry Cardiac Status is: Inpatient Remains inpatient appropriate because: severity of illness    Consultants:  cardiology  Procedures: none  Antimicrobials:  none    Subjective: Mild throat pain, swelling much improved  Objective: Vitals:   12/12/22 2100 12/12/22 2317 12/13/22 0323 12/13/22 0532  BP:  113/67 118/71   Pulse:  89 84   Resp:  16 18   Temp:  97.7 F (36.5 C) 98 F (36.7 C)   TempSrc:  Oral Oral   SpO2:  98% 97%   Weight: (!) 150 kg   67.2 kg  Height: 5\' 4"  (1.626 m)  Intake/Output Summary (Last 24 hours) at 12/13/2022 0827 Last data filed at 12/12/2022 2319 Gross per 24 hour  Intake 240 ml  Output 1000 ml  Net -760 ml   Filed Weights   12/12/22 1744 12/12/22 2100 12/13/22 0532  Weight: 67.3 kg (!) 150 kg 67.2 kg    Examination:  General exam: Appears calm and comfortable  Respiratory system: Clear to auscultation. Respiratory effort normal. Cardiovascular system: S1 & S2 heard, RRR. No JVD, murmurs, rubs, gallops or clicks.  Gastrointestinal system: Abdomen is nondistended, soft and  nontender. No organomegaly or masses felt. Normal bowel sounds heard. Central nervous system: Alert and oriented. No focal neurological deficits. Extremities: Symmetric 5 x 5 power, trace LE edema Skin: No rashes, lesions or ulcers Psychiatry: Judgement and insight appear normal. Mood & affect appropriate.     Data Reviewed: I have personally reviewed following labs and imaging studies  CBC: Recent Labs  Lab 12/12/22 1229 12/13/22 0208  WBC 13.6* 11.3*  NEUTROABS  --  8.3*  HGB 12.2 11.9*  HCT 37.8 35.7*  MCV 86.9 84.2  PLT 354.0 319   Basic Metabolic Panel: Recent Labs  Lab 12/12/22 1229 12/12/22 2316 12/13/22 0208  NA 123* 126* 127*  K 3.7 3.8 3.5  CL 89* 91* 90*  CO2 24 23 26   GLUCOSE 175* 173* 134*  BUN 17 22 19   CREATININE 0.95 0.97 0.90  CALCIUM 8.8 8.6* 8.2*  MG  --   --  1.8   GFR: Estimated Creatinine Clearance: 54.8 mL/min (by C-G formula based on SCr of 0.9 mg/dL). Liver Function Tests: No results for input(s): "AST", "ALT", "ALKPHOS", "BILITOT", "PROT", "ALBUMIN" in the last 168 hours. No results for input(s): "LIPASE", "AMYLASE" in the last 168 hours. No results for input(s): "AMMONIA" in the last 168 hours. Coagulation Profile: No results for input(s): "INR", "PROTIME" in the last 168 hours. Cardiac Enzymes: No results for input(s): "CKTOTAL", "CKMB", "CKMBINDEX", "TROPONINI" in the last 168 hours. BNP (last 3 results) Recent Labs    12/12/22 1229  PROBNP 1,663.0*   HbA1C: No results for input(s): "HGBA1C" in the last 72 hours. CBG: Recent Labs  Lab 12/12/22 2046  GLUCAP 202*   Lipid Profile: No results for input(s): "CHOL", "HDL", "LDLCALC", "TRIG", "CHOLHDL", "LDLDIRECT" in the last 72 hours. Thyroid Function Tests: Recent Labs    12/12/22 1944  TSH 2.723   Anemia Panel: Recent Labs    12/12/22 1234  FERRITIN 55.9  TIBC 268.8  IRON 33*   Urine analysis:    Component Value Date/Time   BILIRUBINUR neg 10/15/2017 0850    PROTEINUR Negative 10/15/2017 0850   UROBILINOGEN 0.2 10/15/2017 0850   NITRITE neg 10/15/2017 0850   LEUKOCYTESUR Negative 10/15/2017 0850   Sepsis Labs: @LABRCNTIP (procalcitonin:4,lacticidven:4)  ) Recent Results (from the past 240 hour(s))  Respiratory (~20 pathogens) panel by PCR     Status: None   Collection Time: 12/12/22  7:44 PM   Specimen: Nasopharyngeal Swab; Respiratory  Result Value Ref Range Status   Adenovirus NOT DETECTED NOT DETECTED Final   Coronavirus 229E NOT DETECTED NOT DETECTED Final    Comment: (NOTE) The Coronavirus on the Respiratory Panel, DOES NOT test for the novel  Coronavirus (2019 nCoV)    Coronavirus HKU1 NOT DETECTED NOT DETECTED Final   Coronavirus NL63 NOT DETECTED NOT DETECTED Final   Coronavirus OC43 NOT DETECTED NOT DETECTED Final   Metapneumovirus NOT DETECTED NOT DETECTED Final   Rhinovirus / Enterovirus NOT DETECTED NOT DETECTED Final  Influenza A NOT DETECTED NOT DETECTED Final   Influenza B NOT DETECTED NOT DETECTED Final   Parainfluenza Virus 1 NOT DETECTED NOT DETECTED Final   Parainfluenza Virus 2 NOT DETECTED NOT DETECTED Final   Parainfluenza Virus 3 NOT DETECTED NOT DETECTED Final   Parainfluenza Virus 4 NOT DETECTED NOT DETECTED Final   Respiratory Syncytial Virus NOT DETECTED NOT DETECTED Final   Bordetella pertussis NOT DETECTED NOT DETECTED Final   Bordetella Parapertussis NOT DETECTED NOT DETECTED Final   Chlamydophila pneumoniae NOT DETECTED NOT DETECTED Final   Mycoplasma pneumoniae NOT DETECTED NOT DETECTED Final    Comment: Performed at [facility], [address], [place], [place] [address]         Radiology Studies: CT Angio Chest PE W/Cm &/Or Wo Cm  Result Date: 12/12/2022 CLINICAL DATA:  Rule out pulmonary embolism.  Shortness of breath. EXAM: CT ANGIOGRAPHY CHEST WITH CONTRAST TECHNIQUE: Multidetector CT imaging of the chest was performed using the standard protocol during bolus administration of  intravenous contrast. Multiplanar CT image reconstructions and MIPs were obtained to evaluate the vascular anatomy. RADIATION DOSE REDUCTION: This exam was performed according to the departmental dose-optimization program which includes automated exposure control, adjustment of the mA and/or kV according to patient size and/or use of iterative reconstruction technique. CONTRAST:  75mL OMNIPAQUE IOHEXOL 350 MG/ML SOLN COMPARISON:  None Available. FINDINGS: Cardiovascular: Satisfactory opacification of the pulmonary arteries to the segmental level. No evidence of pulmonary embolism. Normal heart size. No pericardial effusion. Mild aortic atherosclerosis and coronary artery calcification. No pericardial effusion. Mediastinum/Nodes: Thyroid gland and trachea appear normal. Unremarkable appearance of the esophagus. No mediastinal or hilar adenopathy. Lungs/Pleura: Small to moderate bilateral pleural effusions. Atelectasis and airspace consolidation noted in the right middle lobe and right lower lobe. Signs of interstitial edema with interlobular septal thickening and diffuse hazy ground-glass attenuation is noted bilaterally. Areas of subsegmental atelectasis are identified within the lingula and posterior left lower lobe. Upper Abdomen: Several foci of gas are identified overlying the anterolateral margin of the right hepatic lobe for which pneumoperitoneum cannot be excluded, image 149/4. Musculoskeletal: Multiple healed right rib fractures identified. No acute or suspicious osseous findings. Review of the MIP images confirms the above findings. IMPRESSION: 1. No evidence for acute pulmonary embolus. 2. Small to moderate bilateral pleural effusions. Diffuse interstitial edema is identified. Correlate for signs/symptoms of congestive heart failure. 3. Atelectasis and airspace consolidation noted in the right middle lobe and right lower lobe. 4. Several foci of gas are identified overlying the anterolateral margin of the  right hepatic lobe for which pneumoperitoneum cannot be excluded. 5. Coronary artery calcification. 6.  Aortic Atherosclerosis (ICD10-I70.0). Critical Value/emergent results were called by telephone at the time of interpretation on 12/12/2022 at 6:53 pm to provider [facility] , who verbally acknowledged these results. Electronically Signed   By: Signa Kell M.D.   On: 12/12/2022 18:53   DG Chest 2 View  Addendum Date: 12/12/2022   ADDENDUM REPORT: 12/12/2022 16:09 ADDENDUM: Corrected report: Original report was generated with an error due to voice recognition software, corrected as follows: Comparison dated chest x-ray dated May 15, 2016, rib series dated May 15, 2017 Electronically Signed   By: Allegra Lai M.D.   On: 12/12/2022 16:09   Result Date: 12/12/2022 CLINICAL DATA:  Shortness of breath EXAM: CHEST - 2 VIEW COMPARISON:  Chest x-ray dated May 15, 2026 FINDINGS: New asymmetric fullness of the right hilum. Normal heart size. Small right-greater-than-left pleural  effusions. No evidence of pneumothorax. Old left-sided rib fractures. IMPRESSION: 1. New asymmetric fullness of the right hilum. Recommend contrast-enhanced chest CT to exclude right hilar mass. 2. Small right-greater-than-left pleural effusions. Electronically Signed: By: Allegra Lai M.D. On: 12/12/2022 15:22        Scheduled Meds:  enoxaparin (LOVENOX) injection  40 mg Subcutaneous Q24H   furosemide  40 mg Intravenous BID   insulin aspart  0-15 Units Subcutaneous TID WC   insulin glargine-yfgn  5 Units Subcutaneous QHS   levothyroxine  75 mcg Oral Q0600   losartan  25 mg Oral Daily   rosuvastatin  20 mg Oral Daily   sodium chloride flush  3 mL Intravenous Q12H   Continuous Infusions:  sodium chloride       LOS: 1 day     Silvano Bilis, MD [facility]   If 7PM-7AM, please contact night-coverage [email or web address] Password [facility] 12/13/2022, 8:27 AM

## 2022-12-13 NOTE — Assessment & Plan Note (Signed)
SOB, no signs of fluid overload on exam however has large pannus. Check BNP Consider ECHO if no improvement

## 2022-12-14 ENCOUNTER — Other Ambulatory Visit (HOSPITAL_COMMUNITY): Payer: Self-pay

## 2022-12-14 ENCOUNTER — Encounter (INDEPENDENT_AMBULATORY_CARE_PROVIDER_SITE_OTHER): Payer: Self-pay

## 2022-12-14 ENCOUNTER — Encounter
Admission: EM | Disposition: A | Discharge status: Home / Self Care | Payer: Self-pay | Source: Home / Self Care | Attending: Obstetrics and Gynecology

## 2022-12-14 ENCOUNTER — Encounter: Payer: Self-pay | Admitting: Family Medicine

## 2022-12-14 DIAGNOSIS — I420 Dilated cardiomyopathy: Secondary | ICD-10-CM | POA: Diagnosis not present

## 2022-12-14 DIAGNOSIS — I255 Ischemic cardiomyopathy: Secondary | ICD-10-CM | POA: Insufficient documentation

## 2022-12-14 DIAGNOSIS — E871 Hypo-osmolality and hyponatremia: Secondary | ICD-10-CM | POA: Diagnosis not present

## 2022-12-14 DIAGNOSIS — I2510 Atherosclerotic heart disease of native coronary artery without angina pectoris: Secondary | ICD-10-CM | POA: Insufficient documentation

## 2022-12-14 DIAGNOSIS — I517 Cardiomegaly: Secondary | ICD-10-CM

## 2022-12-14 DIAGNOSIS — I5041 Acute combined systolic (congestive) and diastolic (congestive) heart failure: Secondary | ICD-10-CM | POA: Diagnosis not present

## 2022-12-14 DIAGNOSIS — R7989 Other specified abnormal findings of blood chemistry: Secondary | ICD-10-CM

## 2022-12-14 DIAGNOSIS — J810 Acute pulmonary edema: Principal | ICD-10-CM

## 2022-12-14 DIAGNOSIS — E785 Hyperlipidemia, unspecified: Secondary | ICD-10-CM

## 2022-12-14 DIAGNOSIS — E876 Hypokalemia: Secondary | ICD-10-CM

## 2022-12-14 DIAGNOSIS — I10 Essential (primary) hypertension: Secondary | ICD-10-CM | POA: Diagnosis not present

## 2022-12-14 HISTORY — DX: Acute pulmonary edema: J81.0

## 2022-12-14 HISTORY — PX: RIGHT/LEFT HEART CATH AND CORONARY ANGIOGRAPHY: CATH118266

## 2022-12-14 HISTORY — PX: CORONARY STENT INTERVENTION: CATH118234

## 2022-12-14 LAB — POCT I-STAT 7, (LYTES, BLD GAS, ICA,H+H)
Acid-Base Excess: 7 mmol/L — ABNORMAL HIGH (ref 0.0–2.0)
Bicarbonate: 30.5 mmol/L — ABNORMAL HIGH (ref 20.0–28.0)
Calcium, Ion: 1.05 mmol/L — ABNORMAL LOW (ref 1.15–1.40)
HCT: 38 % (ref 36.0–46.0)
Hemoglobin: 12.9 g/dL (ref 12.0–15.0)
O2 Saturation: 92 %
Potassium: 3 mmol/L — ABNORMAL LOW (ref 3.5–5.1)
Sodium: 135 mmol/L (ref 135–145)
TCO2: 32 mmol/L (ref 22–32)
pCO2 arterial: 39.8 mmHg (ref 32–48)
pH, Arterial: 7.493 — ABNORMAL HIGH (ref 7.35–7.45)
pO2, Arterial: 59 mmHg — ABNORMAL LOW (ref 83–108)

## 2022-12-14 LAB — CBC
HCT: 36.8 % (ref 36.0–46.0)
Hemoglobin: 12.2 g/dL (ref 12.0–15.0)
MCH: 27.9 pg (ref 26.0–34.0)
MCHC: 33.2 g/dL (ref 30.0–36.0)
MCV: 84.2 fL (ref 80.0–100.0)
Platelets: 326 10*3/uL (ref 150–400)
RBC: 4.37 MIL/uL (ref 3.87–5.11)
RDW: 14.2 % (ref 11.5–15.5)
WBC: 8 10*3/uL (ref 4.0–10.5)
nRBC: 0 % (ref 0.0–0.2)

## 2022-12-14 LAB — GLUCOSE, CAPILLARY
Glucose-Capillary: 95 mg/dL (ref 70–99)
Glucose-Capillary: 115 mg/dL — ABNORMAL HIGH (ref 70–99)
Glucose-Capillary: 105 mg/dL — ABNORMAL HIGH (ref 70–99)
Glucose-Capillary: 171 mg/dL — ABNORMAL HIGH (ref 70–99)
Glucose-Capillary: 110 mg/dL — ABNORMAL HIGH (ref 70–99)
Glucose-Capillary: 143 mg/dL — ABNORMAL HIGH (ref 70–99)
Glucose-Capillary: 90 mg/dL (ref 70–99)
Glucose-Capillary: 87 mg/dL (ref 70–99)
Glucose-Capillary: 83 mg/dL (ref 70–99)
Glucose-Capillary: 120 mg/dL — ABNORMAL HIGH (ref 70–99)

## 2022-12-14 LAB — POCT I-STAT EG7
Acid-Base Excess: 8 mmol/L — ABNORMAL HIGH (ref 0.0–2.0)
Bicarbonate: 31.7 mmol/L — ABNORMAL HIGH (ref 20.0–28.0)
Calcium, Ion: 1.04 mmol/L — ABNORMAL LOW (ref 1.15–1.40)
HCT: 38 % (ref 36.0–46.0)
Hemoglobin: 12.9 g/dL (ref 12.0–15.0)
O2 Saturation: 68 %
Potassium: 2.9 mmol/L — ABNORMAL LOW (ref 3.5–5.1)
Sodium: 134 mmol/L — ABNORMAL LOW (ref 135–145)
TCO2: 33 mmol/L — ABNORMAL HIGH (ref 22–32)
pCO2, Ven: 40.4 mmHg — ABNORMAL LOW (ref 44–60)
pH, Ven: 7.503 — ABNORMAL HIGH (ref 7.25–7.43)
pO2, Ven: 32 mmHg (ref 32–45)

## 2022-12-14 LAB — BASIC METABOLIC PANEL
Anion gap: 9 (ref 5–15)
BUN: 15 mg/dL (ref 8–23)
CO2: 29 mmol/L (ref 22–32)
Calcium: 7.8 mg/dL — ABNORMAL LOW (ref 8.9–10.3)
Chloride: 96 mmol/L — ABNORMAL LOW (ref 98–111)
Creatinine, Ser: 0.88 mg/dL (ref 0.44–1.00)
GFR, Estimated: >60 mL/min (ref >60)
Glucose, Bld: 115 mg/dL — ABNORMAL HIGH (ref 70–99)
Potassium: 2.8 mmol/L — ABNORMAL LOW (ref 3.5–5.1)
Sodium: 133 mmol/L — ABNORMAL LOW (ref 135–145)

## 2022-12-14 LAB — POCT ACTIVATED CLOTTING TIME
Activated Clotting Time: 275 seconds
Activated Clotting Time: 299 seconds

## 2022-12-14 LAB — LIPID PANEL
Cholesterol: 141 mg/dL (ref 0–200)
HDL: 36 mg/dL — ABNORMAL LOW (ref >40)
LDL Cholesterol: 87 mg/dL (ref 0–99)
Total CHOL/HDL Ratio: 3.9 ratio
Triglycerides: 91 mg/dL (ref <150)
VLDL: 18 mg/dL (ref 0–40)

## 2022-12-14 LAB — MAGNESIUM: Magnesium: 2.2 mg/dL (ref 1.7–2.4)

## 2022-12-14 LAB — PROTIME-INR
INR: 1.3 — ABNORMAL HIGH (ref 0.8–1.2)
Prothrombin Time: 16.3 s — ABNORMAL HIGH (ref 11.4–15.2)

## 2022-12-14 LAB — POTASSIUM: Potassium: 3.2 mmol/L — ABNORMAL LOW (ref 3.5–5.1)

## 2022-12-14 SURGERY — RIGHT/LEFT HEART CATH AND CORONARY ANGIOGRAPHY
Anesthesia: Moderate Sedation

## 2022-12-14 MED ORDER — FENTANYL CITRATE (PF) 100 MCG/2ML IJ SOLN
INTRAMUSCULAR | Status: AC
  Filled 2022-12-14: qty 2

## 2022-12-14 MED ORDER — HYDRALAZINE HCL 20 MG/ML IJ SOLN: 10.0000 mg | INTRAMUSCULAR | Status: AC | PRN

## 2022-12-14 MED ORDER — LABETALOL HCL 5 MG/ML IV SOLN: 10.0000 mg | INTRAVENOUS | Status: AC | PRN

## 2022-12-14 MED ORDER — MIDAZOLAM HCL 2 MG/2ML IJ SOLN
INTRAMUSCULAR | Status: DC | PRN
  Administered 2022-12-14: 1 mg via INTRAVENOUS

## 2022-12-14 MED ORDER — SODIUM CHLORIDE 0.9% FLUSH: 3.0000 mL | INTRAVENOUS | Status: DC | PRN

## 2022-12-14 MED ORDER — FUROSEMIDE 10 MG/ML IJ SOLN: 40.0000 mg | Freq: Once | INTRAMUSCULAR | Status: DC

## 2022-12-14 MED ORDER — MIDAZOLAM HCL 2 MG/2ML IJ SOLN
INTRAMUSCULAR | Status: AC
  Filled 2022-12-14: qty 2

## 2022-12-14 MED ORDER — IOHEXOL 300 MG/ML  SOLN
INTRAMUSCULAR | Status: DC | PRN
  Administered 2022-12-14: 155 mL

## 2022-12-14 MED ORDER — POTASSIUM CHLORIDE CRYS ER 20 MEQ PO TBCR
60.0000 meq | EXTENDED_RELEASE_TABLET | ORAL | Status: AC
  Administered 2022-12-14: 60 meq via ORAL

## 2022-12-14 MED ORDER — VERAPAMIL HCL 2.5 MG/ML IV SOLN
INTRAVENOUS | Status: AC
  Filled 2022-12-14: qty 2

## 2022-12-14 MED ORDER — VERAPAMIL HCL 2.5 MG/ML IV SOLN
INTRAVENOUS | Status: DC | PRN
  Administered 2022-12-14: 2.5 mg via INTRA_ARTERIAL

## 2022-12-14 MED ORDER — HEPARIN SODIUM (PORCINE) 1000 UNIT/ML IJ SOLN
INTRAMUSCULAR | Status: DC | PRN
  Administered 2022-12-14 (×2): 3000 [IU] via INTRAVENOUS

## 2022-12-14 MED ORDER — TICAGRELOR 90 MG PO TABS
ORAL_TABLET | ORAL | Status: DC | PRN
  Administered 2022-12-14: 180 mg via ORAL

## 2022-12-14 MED ORDER — TICAGRELOR 90 MG PO TABS
ORAL_TABLET | ORAL | Status: AC
  Filled 2022-12-14: qty 2

## 2022-12-14 MED ORDER — POTASSIUM CHLORIDE CRYS ER 20 MEQ PO TBCR
40.0000 meq | EXTENDED_RELEASE_TABLET | Freq: Once | ORAL | Status: DC
  Filled 2022-12-14 (×2): qty 2

## 2022-12-14 MED ORDER — SODIUM CHLORIDE 0.9 % IV SOLN: INTRAVENOUS | Status: DC

## 2022-12-14 MED ORDER — POTASSIUM CHLORIDE CRYS ER 10 MEQ PO TBCR
60.0000 meq | EXTENDED_RELEASE_TABLET | ORAL | Status: DC
  Filled 2022-12-14: qty 3

## 2022-12-14 MED ORDER — FUROSEMIDE 10 MG/ML IJ SOLN
60.0000 mg | Freq: Once | INTRAMUSCULAR | Status: AC
  Administered 2022-12-14: 60 mg via INTRAVENOUS

## 2022-12-14 MED ORDER — FUROSEMIDE 10 MG/ML IJ SOLN: 40.0000 mg | Freq: Two times a day (BID) | INTRAMUSCULAR | Status: DC

## 2022-12-14 MED ORDER — HEPARIN (PORCINE) IN NACL 1000-0.9 UT/500ML-% IV SOLN
INTRAVENOUS | Status: DC | PRN
  Administered 2022-12-14 (×2): 500 mL

## 2022-12-14 MED ORDER — HEPARIN SODIUM (PORCINE) 1000 UNIT/ML IJ SOLN
INTRAMUSCULAR | Status: AC
  Filled 2022-12-14: qty 10

## 2022-12-14 MED ORDER — ACETAMINOPHEN 325 MG PO TABS: 650.0000 mg | ORAL_TABLET | ORAL | Status: DC | PRN

## 2022-12-14 MED ORDER — POTASSIUM CHLORIDE CRYS ER 20 MEQ PO TBCR: 40.0000 meq | EXTENDED_RELEASE_TABLET | ORAL | Status: DC

## 2022-12-14 MED ORDER — SODIUM CHLORIDE 0.9 % IV SOLN: INTRAVENOUS | Status: AC

## 2022-12-14 MED ORDER — ASPIRIN 81 MG PO CHEW: 81.0000 mg | CHEWABLE_TABLET | ORAL | Status: DC

## 2022-12-14 MED ORDER — TICAGRELOR 90 MG PO TABS
90.0000 mg | ORAL_TABLET | Freq: Two times a day (BID) | ORAL | Status: DC
  Administered 2022-12-14 – 2022-12-16 (×4): 90 mg via ORAL
  Filled 2022-12-14 (×4): qty 1

## 2022-12-14 MED ORDER — LIDOCAINE HCL 1 % IJ SOLN
INTRAMUSCULAR | Status: AC
  Filled 2022-12-14: qty 20

## 2022-12-14 MED ORDER — DAPAGLIFLOZIN PROPANEDIOL 10 MG PO TABS
10.0000 mg | ORAL_TABLET | Freq: Every day | ORAL | Status: DC
  Administered 2022-12-14 – 2022-12-16 (×3): 10 mg via ORAL
  Filled 2022-12-14 (×3): qty 1

## 2022-12-14 MED ORDER — FUROSEMIDE 10 MG/ML IJ SOLN
INTRAMUSCULAR | Status: AC
  Filled 2022-12-14: qty 6

## 2022-12-14 MED ORDER — SODIUM CHLORIDE 0.9% FLUSH
3.0000 mL | Freq: Two times a day (BID) | INTRAVENOUS | Status: DC
  Administered 2022-12-15 – 2022-12-16 (×3): 3 mL via INTRAVENOUS

## 2022-12-14 MED ORDER — FUROSEMIDE 10 MG/ML IJ SOLN
60.0000 mg | Freq: Once | INTRAMUSCULAR | Status: AC
  Administered 2022-12-15: 60 mg via INTRAVENOUS
  Filled 2022-12-14: qty 6

## 2022-12-14 MED ORDER — LIDOCAINE HCL (PF) 1 % IJ SOLN
INTRAMUSCULAR | Status: DC | PRN
  Administered 2022-12-14: 2 mL

## 2022-12-14 MED ORDER — SODIUM CHLORIDE 0.9 % IV SOLN: 250.0000 mL | INTRAVENOUS | Status: DC | PRN

## 2022-12-14 SURGICAL SUPPLY — 24 items
BALLN MINITREK RX 2.0X15 (BALLOONS) ×1
BALLN TREK RX 2.5X20 (BALLOONS) ×1
BALLN ~~LOC~~ EUPHORA RX 3.0X15 (BALLOONS) ×1
BALLOON MINITREK RX 2.0X15 (BALLOONS) IMPLANT
BALLOON TREK RX 2.5X20 (BALLOONS) IMPLANT
BALLOON ~~LOC~~ EUPHORA RX 3.0X15 (BALLOONS) IMPLANT
CATH BALLN WEDGE 5F 110CM (CATHETERS) IMPLANT
CATH INFINITI AMBI 5FR TG (CATHETERS) IMPLANT
CATH VISTA GUIDE 6FR XBLAD3.5 (CATHETERS) IMPLANT
DEVICE RAD TR BAND REGULAR (VASCULAR PRODUCTS) IMPLANT
DRAPE BRACHIAL (DRAPES) IMPLANT
GLIDESHEATH SLEND SS 6F .021 (SHEATH) IMPLANT
GUIDEWIRE INQWIRE 1.5J.035X260 (WIRE) IMPLANT
INQWIRE 1.5J .035X260CM (WIRE) ×1
KIT ENCORE 26 ADVANTAGE (KITS) IMPLANT
PACK CARDIAC CATH (CUSTOM PROCEDURE TRAY) ×2 IMPLANT
PAD ELECT DEFIB RADIOL ZOLL (MISCELLANEOUS) IMPLANT
PROTECTION STATION PRESSURIZED (MISCELLANEOUS) ×1
SET ATX-X65L (MISCELLANEOUS) IMPLANT
SHEATH GLIDE SLENDER 4/5FR (SHEATH) IMPLANT
STATION PROTECTION PRESSURIZED (MISCELLANEOUS) IMPLANT
STENT ONYX FRONTIER 2.5X30 (Permanent Stent) IMPLANT
TUBING CIL FLEX 10 FLL-RA (TUBING) IMPLANT
WIRE RUNTHROUGH .014X180CM (WIRE) IMPLANT

## 2022-12-14 NOTE — Progress Notes (Signed)
PT Cancellation Note  Patient Details Name: CAMBRY DENG MRN: [id number] DOB: [birth year]   Cancelled Treatment:    Reason Eval/Treat Not Completed: Patient at procedure or test/unavailable (Patient at the cath lab. PT to continue with attempts as appropriate.)  Donna Bernard, PT, MPT  Ina Homes 12/14/2022, 1:22 PM

## 2022-12-14 NOTE — H&P (View-Only) (Signed)
Progress Note  Patient Name: Whitney Knight Date of Encounter: 12/14/2022  Primary Cardiologist: New - consult by Dr. Okey Dupre, MD  Subjective   No chest pain or dyspnea. Afebrile. Documented UOP 1.8 L for the admission to date. Potassium low at 2.8 this morning. Sodium improving 127-133. Troponin peaked at 2024, now down trending. She is for [facility] later today.   Inpatient Medications    Scheduled Meds:  aspirin EC  81 mg Oral Daily   enoxaparin (LOVENOX) injection  40 mg Subcutaneous Q24H   fluticasone  2 spray Each Nare Daily   furosemide  40 mg Intravenous BID   insulin aspart  0-15 Units Subcutaneous TID WC   insulin glargine-yfgn  5 Units Subcutaneous QHS   levothyroxine  75 mcg Oral Q0600   losartan  12.5 mg Oral Daily   potassium chloride  40 mEq Oral Q4H   rosuvastatin  20 mg Oral Daily   sodium chloride flush  3 mL Intravenous Q12H   Continuous Infusions:  sodium chloride     sodium chloride 10 mL/hr at 12/14/22 0612   PRN Meds: sodium chloride, acetaminophen, albuterol, ondansetron (ZOFRAN) IV, sodium chloride flush   Vital Signs    Vitals:   12/13/22 1614 12/13/22 1930 12/13/22 2344 12/14/22 0439  BP: (!) 114/59 113/67 104/62 119/73  Pulse: 79 86 80 78  Resp: 20 (!) 24 19 18   Temp: 98.2 F (36.8 C) 98.6 F (37 C) (!) 97.5 F (36.4 C) 98.1 F (36.7 C)  TempSrc: Oral Oral Oral Oral  SpO2: 100% 99% 98% 98%  Weight:      Height:        Intake/Output Summary (Last 24 hours) at 12/14/2022 0820 Last data filed at 12/14/2022 4034 Gross per 24 hour  Intake 120 ml  Output 1225 ml  Net -1105 ml   Filed Weights   12/12/22 1744 12/12/22 2100 12/13/22 0532  Weight: 67.3 kg (!) 150 kg 67.2 kg    Telemetry    SR with sinus arrhythmia with occasional isolated PVCs, artifact, 70s-80s bpm - Personally Reviewed  ECG    No new tracings - Personally Reviewed  Physical Exam   GEN: No acute distress.   Neck: No JVD. Cardiac: RRR, II/VI systolic murmur LUSB,  no rubs, or gallops.  Respiratory: Diminished breath sounds along the bases bilaterally.  GI: Soft, nontender, non-distended.   MS: No edema; No deformity. Neuro:  Alert and oriented x 3; Nonfocal.  Psych: Normal affect.  Labs    Chemistry Recent Labs  Lab 12/12/22 2316 12/13/22 0208 12/14/22 0629  NA 126* 127* 133*  K 3.8 3.5 2.8*  CL 91* 90* 96*  CO2 23 26 29   GLUCOSE 173* 134* 115*  BUN 22 19 15   CREATININE 0.97 0.90 0.88  CALCIUM 8.6* 8.2* 7.8*  GFRNONAA >60 >60 >60  ANIONGAP 12 11 9      Hematology Recent Labs  Lab 12/12/22 1229 12/13/22 0208 12/14/22 0629  WBC 13.6* 11.3* 8.0  RBC 4.35 4.24 4.37  HGB 12.2 11.9* 12.2  HCT 37.8 35.7* 36.8  MCV 86.9 84.2 84.2  MCH  --  28.1 27.9  MCHC 32.2 33.3 33.2  RDW 14.3 13.9 14.2  PLT 354.0 319 326    Cardiac EnzymesNo results for input(s): "TROPONINI" in the last 168 hours. No results for input(s): "TROPIPOC" in the last 168 hours.   BNP Recent Labs  Lab 12/12/22 1229  PROBNP 1,663.0*     DDimer No results for input(s): "  DDIMER" in the last 168 hours.   Radiology    DG Abd 1 View  Result Date: 12/13/2022 IMPRESSION: 1. Nonobstructive bowel gas pattern. 2. No supine evidence for pneumoperitoneum. Electronically Signed   By: Signa Kell M.D.   On: 12/13/2022 14:15   CT Angio Chest PE W/Cm &/Or Wo Cm  Result Date: 12/12/2022 IMPRESSION: 1. No evidence for acute pulmonary embolus. 2. Small to moderate bilateral pleural effusions. Diffuse interstitial edema is identified. Correlate for signs/symptoms of congestive heart failure. 3. Atelectasis and airspace consolidation noted in the right middle lobe and right lower lobe. 4. Several foci of gas are identified overlying the anterolateral margin of the right hepatic lobe for which pneumoperitoneum cannot be excluded. 5. Coronary artery calcification. 6.  Aortic Atherosclerosis (ICD10-I70.0). Critical Value/emergent results were called by telephone at the time of  interpretation on 12/12/2022 at 6:53 pm to provider [facility] , who verbally acknowledged these results. Electronically Signed   By: Signa Kell M.D.   On: 12/12/2022 18:53   DG Chest 2 View  Addendum Date: 12/12/2022   ADDENDUM REPORT: 12/12/2022 16:09 ADDENDUM: Corrected report: Original report was generated with an error due to voice recognition software, corrected as follows: Comparison dated chest x-ray dated May 15, 2016, rib series dated May 15, 2017 Electronically Signed   By: Allegra Lai M.D.   On: 12/12/2022 16:09   Result Date: 12/12/2022 IMPRESSION: 1. New asymmetric fullness of the right hilum. Recommend contrast-enhanced chest CT to exclude right hilar mass. 2. Small right-greater-than-left pleural effusions. Electronically Signed: By: Allegra Lai M.D. On: 12/12/2022 15:22    Cardiac Studies   Calcium score 02/24/2021: IMPRESSION AND RECOMMENDATION: 1. Coronary calcium score of 37.1. This was 64th percentile for age and sex matched control. 2. CAC 1-99 in LAD.  CAC-DRS A1/N1. 3. Continue heart healthy lifestyle and risk factor modification. 4. Consider statin therapy due to Age >55. __________  2D echo 12/13/2022: 1. Left ventricular ejection fraction, by estimation, is 30 to 35%. The  left ventricle has moderately decreased function. The left ventricle  demonstrates regional wall motion abnormalities (see scoring  diagram/findings for description). Left ventricular   diastolic parameters are consistent with Grade II diastolic dysfunction  (pseudonormalization). Elevated left atrial pressure. The average left  ventricular global longitudinal strain is -6.7 %. The global longitudinal  strain is abnormal.   2. Right ventricular systolic function is normal. The right ventricular  size is normal. There is normal pulmonary artery systolic pressure.   3. Left atrial size was mildly dilated.   4. Moderate pleural effusion in both left and right lateral  regions.   5. The mitral valve is abnormal. Mild to moderate mitral valve  regurgitation. No evidence of mitral stenosis.   6. Tricuspid valve regurgitation is moderate.   7. The aortic valve is tricuspid. Aortic valve regurgitation is not  visualized. No aortic stenosis is present.   8. The inferior vena cava is normal in size with greater than 50%  respiratory variability, suggesting right atrial pressure of 3 mmHg.   Conclusion(s)/Recommendation(s): No left ventricular mural or apical thrombus/thrombi.   Patient Profile     72 y.o. female with history of DM2, HTN, HLD, hypothyroidism, and osteoporosis who was admitted on 12/12/2022 with progressive heart failure symptoms and we are seeing for likely viral myocarditis  Assessment & Plan    1. Acute HFrEF with suspected viral myocarditis and elevated high-sensitivity troponin: -Currently, without chest pain or dyspnea -Suspected to be NICM,  though with multiple risk factors ncluding hypertension, hyperlipidemia, diabetes mellitus, family history, and known coronary artery calcification  -Challenge with [place] post cath, noted nausea/vomiting with Jardiance -Continue losartan -Escalate GDMT prior to discharge an in follow up as able, pending BP -Not currently on beta blocker with acute HFrEF -NPO for [facility] today with Dr. Herbie [place] -CHF education  2. HTN: -Blood pressure stable -Medical therapy as outlined above  3. HLD: -LDL 87 this admission -Previously hesitant to start statin therapy -Has been started on Crestor this admission  4. Hyponatremia: -Improving -Most likely due to her heart failure though SIADH is a consideration as well with recent respiratory illness   5. Hypokalemia: -Repletion ordered for this morning, prior to [facility] -Repeat potassium ordered for 11:30   Informed Consent   Shared Decision Making/Informed Consent{  The risks [stroke (1 in 1000), death (1 in 1000), kidney failure [usually temporary] (1 in  500), bleeding (1 in 200), allergic reaction [possibly serious] (1 in 200)], benefits (diagnostic support and management of coronary artery disease) and alternatives of a cardiac catheterization were discussed in detail with Ms. Roccia and she is willing to proceed.      For questions or updates, please contact [facility] Please consult [email or web address] for contact info under Cardiology/STEMI.    Signed, Eula Listen, PA-C [facility] Pager: [phone] 12/14/2022, 8:20 AM

## 2022-12-14 NOTE — Progress Notes (Signed)
Pt refused bladder scan added "I am voiding well". Pt was educated about its importance. Will continue to monitor.

## 2022-12-14 NOTE — Progress Notes (Signed)
Progress Note  Patient Name: Whitney Knight Date of Encounter: 12/14/2022  Primary Cardiologist: New - consult by Dr. Okey Dupre, MD  Subjective   No chest pain or dyspnea. Afebrile. Documented UOP 1.8 L for the admission to date. Potassium low at 2.8 this morning. Sodium improving 127-133. Troponin peaked at 2024, now down trending. She is for [facility] later today.   Inpatient Medications    Scheduled Meds:  aspirin EC  81 mg Oral Daily   enoxaparin (LOVENOX) injection  40 mg Subcutaneous Q24H   fluticasone  2 spray Each Nare Daily   furosemide  40 mg Intravenous BID   insulin aspart  0-15 Units Subcutaneous TID WC   insulin glargine-yfgn  5 Units Subcutaneous QHS   levothyroxine  75 mcg Oral Q0600   losartan  12.5 mg Oral Daily   potassium chloride  40 mEq Oral Q4H   rosuvastatin  20 mg Oral Daily   sodium chloride flush  3 mL Intravenous Q12H   Continuous Infusions:  sodium chloride     sodium chloride 10 mL/hr at 12/14/22 0612   PRN Meds: sodium chloride, acetaminophen, albuterol, ondansetron (ZOFRAN) IV, sodium chloride flush   Vital Signs    Vitals:   12/13/22 1614 12/13/22 1930 12/13/22 2344 12/14/22 0439  BP: (!) 114/59 113/67 104/62 119/73  Pulse: 79 86 80 78  Resp: 20 (!) 24 19 18   Temp: 98.2 F (36.8 C) 98.6 F (37 C) (!) 97.5 F (36.4 C) 98.1 F (36.7 C)  TempSrc: Oral Oral Oral Oral  SpO2: 100% 99% 98% 98%  Weight:      Height:        Intake/Output Summary (Last 24 hours) at 12/14/2022 0820 Last data filed at 12/14/2022 4034 Gross per 24 hour  Intake 120 ml  Output 1225 ml  Net -1105 ml   Filed Weights   12/12/22 1744 12/12/22 2100 12/13/22 0532  Weight: 67.3 kg (!) 150 kg 67.2 kg    Telemetry    SR with sinus arrhythmia with occasional isolated PVCs, artifact, 70s-80s bpm - Personally Reviewed  ECG    No new tracings - Personally Reviewed  Physical Exam   GEN: No acute distress.   Neck: No JVD. Cardiac: RRR, II/VI systolic murmur LUSB,  no rubs, or gallops.  Respiratory: Diminished breath sounds along the bases bilaterally.  GI: Soft, nontender, non-distended.   MS: No edema; No deformity. Neuro:  Alert and oriented x 3; Nonfocal.  Psych: Normal affect.  Labs    Chemistry Recent Labs  Lab 12/12/22 2316 12/13/22 0208 12/14/22 0629  NA 126* 127* 133*  K 3.8 3.5 2.8*  CL 91* 90* 96*  CO2 23 26 29   GLUCOSE 173* 134* 115*  BUN 22 19 15   CREATININE 0.97 0.90 0.88  CALCIUM 8.6* 8.2* 7.8*  GFRNONAA >60 >60 >60  ANIONGAP 12 11 9      Hematology Recent Labs  Lab 12/12/22 1229 12/13/22 0208 12/14/22 0629  WBC 13.6* 11.3* 8.0  RBC 4.35 4.24 4.37  HGB 12.2 11.9* 12.2  HCT 37.8 35.7* 36.8  MCV 86.9 84.2 84.2  MCH  --  28.1 27.9  MCHC 32.2 33.3 33.2  RDW 14.3 13.9 14.2  PLT 354.0 319 326    Cardiac EnzymesNo results for input(s): "TROPONINI" in the last 168 hours. No results for input(s): "TROPIPOC" in the last 168 hours.   BNP Recent Labs  Lab 12/12/22 1229  PROBNP 1,663.0*     DDimer No results for input(s): "  DDIMER" in the last 168 hours.   Radiology    DG Abd 1 View  Result Date: 12/13/2022 IMPRESSION: 1. Nonobstructive bowel gas pattern. 2. No supine evidence for pneumoperitoneum. Electronically Signed   By: Signa Kell M.D.   On: 12/13/2022 14:15   CT Angio Chest PE W/Cm &/Or Wo Cm  Result Date: 12/12/2022 IMPRESSION: 1. No evidence for acute pulmonary embolus. 2. Small to moderate bilateral pleural effusions. Diffuse interstitial edema is identified. Correlate for signs/symptoms of congestive heart failure. 3. Atelectasis and airspace consolidation noted in the right middle lobe and right lower lobe. 4. Several foci of gas are identified overlying the anterolateral margin of the right hepatic lobe for which pneumoperitoneum cannot be excluded. 5. Coronary artery calcification. 6.  Aortic Atherosclerosis (ICD10-I70.0). Critical Value/emergent results were called by telephone at the time of  interpretation on 12/12/2022 at 6:53 pm to provider [facility] , who verbally acknowledged these results. Electronically Signed   By: Signa Kell M.D.   On: 12/12/2022 18:53   DG Chest 2 View  Addendum Date: 12/12/2022   ADDENDUM REPORT: 12/12/2022 16:09 ADDENDUM: Corrected report: Original report was generated with an error due to voice recognition software, corrected as follows: Comparison dated chest x-ray dated May 15, 2016, rib series dated May 15, 2017 Electronically Signed   By: Allegra Lai M.D.   On: 12/12/2022 16:09   Result Date: 12/12/2022 IMPRESSION: 1. New asymmetric fullness of the right hilum. Recommend contrast-enhanced chest CT to exclude right hilar mass. 2. Small right-greater-than-left pleural effusions. Electronically Signed: By: Allegra Lai M.D. On: 12/12/2022 15:22    Cardiac Studies   Calcium score 02/24/2021: IMPRESSION AND RECOMMENDATION: 1. Coronary calcium score of 37.1. This was 64th percentile for age and sex matched control. 2. CAC 1-99 in LAD.  CAC-DRS A1/N1. 3. Continue heart healthy lifestyle and risk factor modification. 4. Consider statin therapy due to Age >55. __________  2D echo 12/13/2022: 1. Left ventricular ejection fraction, by estimation, is 30 to 35%. The  left ventricle has moderately decreased function. The left ventricle  demonstrates regional wall motion abnormalities (see scoring  diagram/findings for description). Left ventricular   diastolic parameters are consistent with Grade II diastolic dysfunction  (pseudonormalization). Elevated left atrial pressure. The average left  ventricular global longitudinal strain is -6.7 %. The global longitudinal  strain is abnormal.   2. Right ventricular systolic function is normal. The right ventricular  size is normal. There is normal pulmonary artery systolic pressure.   3. Left atrial size was mildly dilated.   4. Moderate pleural effusion in both left and right lateral  regions.   5. The mitral valve is abnormal. Mild to moderate mitral valve  regurgitation. No evidence of mitral stenosis.   6. Tricuspid valve regurgitation is moderate.   7. The aortic valve is tricuspid. Aortic valve regurgitation is not  visualized. No aortic stenosis is present.   8. The inferior vena cava is normal in size with greater than 50%  respiratory variability, suggesting right atrial pressure of 3 mmHg.   Conclusion(s)/Recommendation(s): No left ventricular mural or apical thrombus/thrombi.   Patient Profile     72 y.o. female with history of DM2, HTN, HLD, hypothyroidism, and osteoporosis who was admitted on 12/12/2022 with progressive heart failure symptoms and we are seeing for likely viral myocarditis  Assessment & Plan    1. Acute HFrEF with suspected viral myocarditis and elevated high-sensitivity troponin: -Currently, without chest pain or dyspnea -Suspected to be NICM,  though with multiple risk factors ncluding hypertension, hyperlipidemia, diabetes mellitus, family history, and known coronary artery calcification  -Challenge with [place] post cath, noted nausea/vomiting with Jardiance -Continue losartan -Escalate GDMT prior to discharge an in follow up as able, pending BP -Not currently on beta blocker with acute HFrEF -NPO for [facility] today with Dr. Herbie Baltimore -CHF education  2. HTN: -Blood pressure stable -Medical therapy as outlined above  3. HLD: -LDL 87 this admission -Previously hesitant to start statin therapy -Has been started on Crestor this admission  4. Hyponatremia: -Improving -Most likely due to her heart failure though SIADH is a consideration as well with recent respiratory illness   5. Hypokalemia: -Repletion ordered for this morning, prior to [facility] -Repeat potassium ordered for 11:30   Informed Consent   Shared Decision Making/Informed Consent{  The risks [stroke (1 in 1000), death (1 in 1000), kidney failure [usually temporary] (1 in  500), bleeding (1 in 200), allergic reaction [possibly serious] (1 in 200)], benefits (diagnostic support and management of coronary artery disease) and alternatives of a cardiac catheterization were discussed in detail with Ms. Roccia and she is willing to proceed.      For questions or updates, please contact [facility] Please consult [email or web address] for contact info under Cardiology/STEMI.    Signed, Eula Listen, PA-C [facility] Pager: [phone] 12/14/2022, 8:20 AM

## 2022-12-14 NOTE — Progress Notes (Signed)
Patient had orders for 40mg  and 60mg  iv lasix, paged Dr Herbie Baltimore and he states to give patient 60mg  iv lasix now, will give prior to going back to room. Vidal Schwalbe, RN

## 2022-12-14 NOTE — Brief Op Note (Signed)
BRIEF CARDIAC CATHETERIZATION-PCI NOTE  12/14/2022  3:08 PM  PROCEDURE:  Procedure(s): RIGHT/LEFT HEART CATH AND CORONARY ANGIOGRAPHY (N/A) CORONARY STENT INTERVENTION (N/A)  SURGEON:  Surgeons and Role: Marykay Lex, MD - Primary  PATIENT:  Whitney Knight  72 y.o. female with PMH notable for HTN, HLD, DM-2, hypothyroidism and osteoporosis admitted in late 13 2024 with progressive heart failure symptoms concerning for possible viral myocarditis with echo showing EF of 30 to 35%.  Hypokinesis with GR 2 DD and elevated LAP.  Based on her medical history and need to confirm true nonischemic versus ischemic cardiomyopathy, she is referred for invasive evaluation with cardiac catheterization poss PCI.  PRE-OPERATIVE DIAGNOSIS:  new onset heart failure with possible viral myocarditis  POST-OPERATIVE DIAGNOSIS:   Severe single-vessel disease with 100% occlusion of the prox-mid LAD at SP1 otherwise no significant disease in a somewhat codominant system with the wraparound LAD providing the distal half to one third of the PDA territory. Difficult but successful DES PCI of the LAD with a Resolute Onyx DES 2.5 mm x 30 mm stent postdilated and tapered fashion from 3.1 to 2.8 mm.  2 small diagonal branches noted after stent with 1 small diagonal that barely feels that arose from the original occluded section. Consistent combined CHF with known EF of 30 to 35% and PCWP of 29 millimercury and LVEDP of 21 mL of mercury, but preserved cardiac output put and index with cardiac output of 5.44 and index of 3.16. RHC numbers: RAP 5 mmHg; RV P-EDP 46/0-8 mmHg; PAP-mean 46/19-32 mmHg (mild to moderate WHO class II-LV CHF related), PCWP 28 mmHg.  LV P-EDP 113/5-21 mmHg; AO P-MAP 104/68-81 mmHg.  Ao sat 90%, PA sat 68%.  PROCEDURE PERFORMED Time Out: Verified patient identification, verified procedure, site/side was marked, verified correct patient position, special equipment/implants available,  medications/allergies/relevent history reviewed, required imaging and test results available. Performed.  Access:  RIGHT radial Artery: 6 Fr sheath -- Seldinger technique using Micropuncture Kit Direct ultrasound guidance used.  Permanent image obtained and placed on chart. 10 mL radial cocktail IA; 3000 units IV Heparin  * Right Brachial/Antecubital Vein: The existing 18-gauge IV was exchanged over a wire for a 5Fr  sheath  Right Heart Catheterization: 5 Fr Theone Murdoch catheter advanced under fluoroscopy with balloon inflated to the RA, RV, then PCWP-PA for hemodynamic measurement.  * Simultaneous FA & PA blood gases checked for SaO2% to calculate FICK CO/CI  * Catheter removed completely out of the body with balloon deflated.  Left Heart Catheterization: 5 and 6 Fr Catheters advanced or exchanged over a J-wire under direct fluoroscopic guidance into the ascending aorta; TIG 4.0 catheter advanced first.  * LV Hemodynamics (LV Gram): TIG 4.0 catheter  * Left & Right Coronary Artery Cineangiography: TIG 4.0 catheter   Cath results revealed flush occlusion of the LAD after SP1.  Discussed results with Dr. Amasa Sink.  We both felt that based on the distribution of disease and symptoms, that was worked up to try to attempt PCI.  Therefore plans are made for PCI of the LAD Additional 3000 units of IV heparin administered to achieve and maintain an ACT greater than 250 sec Once I was able to cross the lesion successfully with a wire, patient was loaded with 180 mg p.o. Brilinta  Upon completion of Angiogaphy, the catheter was removed completely out of the body over a wire, without complication.  Brachial Sheath(s) removed in the Cath Lab with manual pressure for hemostasis.  Radial sheath removed in the Cardiac Catheterization lab with TR Band placed for hemostasis.  TR Band: 1450  Hours; 9 mL air; unable to do [place] because of difficulty with plethysmography.  MEDICATIONS * SQ Lidocaine 3mL *  Radial Cocktail: 3 mg Verapmil in 10 mL NS * Isovue Contrast: 150 mL * Heparin: Total 6000 units; Brilinta 180 mg p.o.  DICTATION: .Note written in EPIC  PATIENT DISPOSITION:  PACU - hemodynamically stable.  PLAN OF CARE:  Patient continues to be inpatient.  I have written for 1 additional dose of IV Lasix 40 mg to be given today post cath.  Will continue with IV diuresis given elevated LVEDP and PCWP.  DAPT for minimum 1 year but would like to continue Thienopyridine for lifetime based on length of stent in the LAD.  Continue to titrate GDMT for CAD and CM/CHF.  Phase 2 CRH ordered.   Delay start of Pharmacological VTE agent (>24hrs) due to surgical blood loss or risk of bleeding: not applicable   Bryan Lemma, MD

## 2022-12-14 NOTE — Progress Notes (Signed)
PROGRESS NOTE    Whitney Knight  UYQ:[id number] DOB: [birth year] DOA: 12/12/2022 PCP: Glori Luis, MD     Brief Narrative:   From admission h and p   Whitney Knight is a 72 y.o. female with medical history significant of hypertension, type 2 diabetes, hyperlipidemia, osteoporosis, hypothyroidism, who presents to the ED due to shortness of breath.   Whitney Knight states she began to feel ill around August 3rd, with generalized malaise. She subsequently developed a sore throat and due to this, went to an urgent care.  She was told that she was negative for group A strep.  Then over the last 1 to 2 days, she has developed rapidly onset abdominal distention, lower extremity swelling, orthopnea, and shortness of breath.  She endorses diarrhea yesterday, but states it was after taking a laxative. No abdominal pain at this time. No prior history of heart failure.  She notes that she lost approximately 40 pounds intentionally and it seems that over the last 1 to 2 days, all the weight suddenly came back on.   Patient notes that she has been on a clean diet regimen. She drinks 3-4 packs of electrolytes supplements per day. On review of packets, total sodium = 855 mg per drink. We discussed this is very high salt intake.    Assessment & Plan:   Principal Problem:   Acute heart failure (HCC) Active Problems:   Hyponatremia   Gas foci overlying anterolateral margin of the right hepatic lobe   Hypothyroidism   Hypertension   Uncontrolled type 2 diabetes mellitus with hyperglycemia (HCC)   Iron deficiency anemia   Acute pulmonary edema (HCC)   Cardiomegaly   Dilated cardiomyopathy (HCC)   Elevated troponin   Coronary artery disease   Ischemic cardiomyopathy  # Acute systolic CHF exacerbation # Ischemic cardiomyopathy # NSTEMI Several days swelling, bnp markedly elevated, pulm edema on CTA. No PE. Covid and RVP are negative. EF 30-35% on TTE. Cath 8/15 showing 100% occlusion of the  prox-mid LAD with DES placed - diuresis and gdmt per cardiology  # Hyponatremia Likely hypervolemic from chf. Improved to 133 today from 123 on presentation - continue diuresis and monitor  # Gas foci overlying anterolateral margin of the right hepatic lobe Per admitting provider, "EDP discussed with radiology team, who is recommending repeat abdominal x-ray tomorrow unless patient becomes symptomatic, then will need CT of the abdomen." Continues to deny abdominal pain, no abdominal tenderness. KUB with no free air - monitor  # Hypothyroid Tsh wnl - cont home synthroid  # Hypokalemia 2.8 today improved to 3.2 with repletion - more potassium today - f/u Mg  # T2DM Euglycemic - holding home glimeperide, mounjaro - continue SSI  # HTN Here normotensive in setting of diuresis - cont to hold home hydrochlorothiazide and amlodipine  DVT prophylaxis: lovenox Code Status: full Family Communication: husband updated @ bedside 8/15  Level of care: Telemetry Cardiac Status is: Inpatient Remains inpatient appropriate because: severity of illness    Consultants:  cardiology  Procedures: none  Antimicrobials:  none    Subjective: Feeling OK post-cath  Objective: Vitals:   12/14/22 1545 12/14/22 1600 12/14/22 1615 12/14/22 1630  BP: 115/83 96/74  97/64  Pulse: 88 89 84 81  Resp: 20 20 20 17   Temp:      TempSrc:      SpO2: 92% 92% 93% 93%  Weight:      Height:        Intake/Output  Summary (Last 24 hours) at 12/14/2022 1656 Last data filed at 12/14/2022 7829 Gross per 24 hour  Intake 120 ml  Output 1225 ml  Net -1105 ml   Filed Weights   12/12/22 1744 12/12/22 2100 12/13/22 0532  Weight: 67.3 kg (!) 150 kg 67.2 kg    Examination:  General exam: Appears calm and comfortable  Respiratory system: Clear to auscultation. Respiratory effort normal. Cardiovascular system: S1 & S2 heard, RRR. No JVD, murmurs, rubs, gallops or clicks.  Gastrointestinal system:  Abdomen is nondistended, soft and nontender. No organomegaly or masses felt. Normal bowel sounds heard. Central nervous system: Alert and oriented. No focal neurological deficits. Extremities: Symmetric 5 x 5 power, trace LE edema Skin: No rashes, lesions or ulcers Psychiatry: Judgement and insight appear normal. Mood & affect appropriate.     Data Reviewed: I have personally reviewed following labs and imaging studies  CBC: Recent Labs  Lab 12/12/22 1229 12/13/22 0208 12/14/22 0629 12/14/22 1337 12/14/22 1347  WBC 13.6* 11.3* 8.0  --   --   NEUTROABS  --  8.3*  --   --   --   HGB 12.2 11.9* 12.2 12.9 12.9  HCT 37.8 35.7* 36.8 38.0 38.0  MCV 86.9 84.2 84.2  --   --   PLT 354.0 319 326  --   --    Basic Metabolic Panel: Recent Labs  Lab 12/12/22 1229 12/12/22 2316 12/13/22 0208 12/14/22 0629 12/14/22 1148 12/14/22 1337 12/14/22 1347  NA 123* 126* 127* 133*  --  135 134*  K 3.7 3.8 3.5 2.8* 3.2* 3.0* 2.9*  CL 89* 91* 90* 96*  --   --   --   CO2 24 23 26 29   --   --   --   GLUCOSE 175* 173* 134* 115*  --   --   --   BUN 17 22 19 15   --   --   --   CREATININE 0.95 0.97 0.90 0.88  --   --   --   CALCIUM 8.8 8.6* 8.2* 7.8*  --   --   --   MG  --   --  1.8  --   --   --   --    GFR: Estimated Creatinine Clearance: 56.1 mL/min (by C-G formula based on SCr of 0.88 mg/dL). Liver Function Tests: No results for input(s): "AST", "ALT", "ALKPHOS", "BILITOT", "PROT", "ALBUMIN" in the last 168 hours. No results for input(s): "LIPASE", "AMYLASE" in the last 168 hours. No results for input(s): "AMMONIA" in the last 168 hours. Coagulation Profile: Recent Labs  Lab 12/14/22 0629  INR 1.3*   Cardiac Enzymes: No results for input(s): "CKTOTAL", "CKMB", "CKMBINDEX", "TROPONINI" in the last 168 hours. BNP (last 3 results) Recent Labs    12/12/22 1229  PROBNP 1,663.0*   HbA1C: No results for input(s): "HGBA1C" in the last 72 hours. CBG: Recent Labs  Lab 12/14/22 0618  12/14/22 0842 12/14/22 1129 12/14/22 1210 12/14/22 1520  GLUCAP 110* 143* 90 87 83   Lipid Profile: Recent Labs    12/14/22 0629  CHOL 141  HDL 36*  LDLCALC 87  TRIG 91  CHOLHDL 3.9   Thyroid Function Tests: Recent Labs    12/12/22 1944  TSH 2.723   Anemia Panel: Recent Labs    12/12/22 1234  FERRITIN 55.9  TIBC 268.8  IRON 33*   Urine analysis:    Component Value Date/Time   BILIRUBINUR neg 10/15/2017 0850   PROTEINUR Negative  10/15/2017 0850   UROBILINOGEN 0.2 10/15/2017 0850   NITRITE neg 10/15/2017 0850   LEUKOCYTESUR Negative 10/15/2017 0850   Sepsis Labs: @LABRCNTIP (procalcitonin:4,lacticidven:4)  ) Recent Results (from the past 240 hour(s))  Respiratory (~20 pathogens) panel by PCR     Status: None   Collection Time: 12/12/22  7:44 PM   Specimen: Nasopharyngeal Swab; Respiratory  Result Value Ref Range Status   Adenovirus NOT DETECTED NOT DETECTED Final   Coronavirus 229E NOT DETECTED NOT DETECTED Final    Comment: (NOTE) The Coronavirus on the Respiratory Panel, DOES NOT test for the novel  Coronavirus (2019 nCoV)    Coronavirus HKU1 NOT DETECTED NOT DETECTED Final   Coronavirus NL63 NOT DETECTED NOT DETECTED Final   Coronavirus OC43 NOT DETECTED NOT DETECTED Final   Metapneumovirus NOT DETECTED NOT DETECTED Final   Rhinovirus / Enterovirus NOT DETECTED NOT DETECTED Final   Influenza A NOT DETECTED NOT DETECTED Final   Influenza B NOT DETECTED NOT DETECTED Final   Parainfluenza Virus 1 NOT DETECTED NOT DETECTED Final   Parainfluenza Virus 2 NOT DETECTED NOT DETECTED Final   Parainfluenza Virus 3 NOT DETECTED NOT DETECTED Final   Parainfluenza Virus 4 NOT DETECTED NOT DETECTED Final   Respiratory Syncytial Virus NOT DETECTED NOT DETECTED Final   Bordetella pertussis NOT DETECTED NOT DETECTED Final   Bordetella Parapertussis NOT DETECTED NOT DETECTED Final   Chlamydophila pneumoniae NOT DETECTED NOT DETECTED Final   Mycoplasma pneumoniae NOT  DETECTED NOT DETECTED Final    Comment: Performed at [facility], [address] [place], [place] [address]  SARS Coronavirus 2 by RT PCR (hospital order, performed in [facility] hospital lab) *cepheid single result test* Anterior Nasal Swab     Status: None   Collection Time: 12/13/22  9:30 AM   Specimen: Anterior Nasal Swab  Result Value Ref Range Status   SARS Coronavirus 2 by RT PCR NEGATIVE NEGATIVE Final    Comment: (NOTE) SARS-CoV-2 target nucleic acids are NOT DETECTED.  The SARS-CoV-2 RNA is generally detectable in upper and lower respiratory specimens during the acute phase of infection. The lowest concentration of SARS-CoV-2 viral copies this assay can detect is 250 copies / mL. A negative result does not preclude SARS-CoV-2 infection and should not be used as the sole basis for treatment or other patient management decisions.  A negative result may occur with improper specimen collection / handling, submission of specimen other than nasopharyngeal swab, presence of viral mutation(s) within the areas targeted by this assay, and inadequate number of viral copies (<250 copies / mL). A negative result must be combined with clinical observations, patient history, and epidemiological information.  Fact Sheet for Patients:   RoadLapTop.co.za  Fact Sheet for Healthcare Providers: http://kim-miller.com/  This test is not yet approved or  cleared by the [place] FDA and has been authorized for detection and/or diagnosis of SARS-CoV-2 by FDA under an Emergency Use Authorization (EUA).  This EUA will remain in effect (meaning this test can be used) for the duration of the COVID-19 declaration under Section 564(b)(1) of the Act, 21 U.S.C. section 360bbb-3(b)(1), unless the authorization is terminated or revoked sooner.  Performed at [facility], [address] [place], [place] [address]           Radiology Studies: CARDIAC CATHETERIZATION  Result Date: 12/14/2022   CULPRIT LESION: Prox LAD to Mid LAD lesion is 100% stenosed with 0% stenosed side branch in 1st Diag.   A drug-eluting stent  was successfully placed using a STENT ONYX FRONTIER 2.5X30. => Taper postdilation from 3.1 to 2.8 mm   Post intervention, there is a 0% residual stenosis.  TIMI-3 flow restored.   Post intervention, the side branch was reduced to 0% residual stenosis distally (very small vessel); 1st Diag lesion is 100% stenosed distally.   Hemodynamic findings consistent with mild pulmonary hypertension.   There is no aortic valve stenosis.   Anticipated discharge date to be determined.   Patient still has need for additional titration of CHF/C medications along with additional diuresis   Recommend uninterrupted dual antiplatelet therapy with Aspirin 81mg  daily and Ticagrelor 90mg  twice daily for a minimum of 12 months (ACS-Class I recommendation).   Would continue Thienopyridine monotherapy with either clopidogrel 75 mg daily or ticagrelor 60 mg twice daily long-term based on 30 mm stent in the proximal LAD. POST-OPERATIVE DIAGNOSIS:  Severe single-vessel disease with 100% occlusion of the prox-mid LAD at SP1 otherwise no significant disease in a somewhat codominant system with the wraparound LAD providing the distal half to one third of the PDA territory. Difficult but successful DES PCI of the LAD with a Resolute Onyx DES 2.5 mm x 30 mm stent postdilated and tapered fashion from 3.1 to 2.8 mm.  2 small diagonal branches noted after stent with 1 small diagonal that barely feels that arose from the original occluded section. Consistent combined CHF with known EF of 30 to 35% and PCWP of 29 millimercury and LVEDP of 21 mL of mercury, but preserved cardiac output put and index with cardiac output of 5.44 and index of 3.16. RHC numbers: RAP 5 mmHg; RV P-EDP 46/0-8 mmHg; PAP-mean 46/19-32 mmHg (mild to moderate WHO class II-LV  CHF related), PCWP 28 mmHg.  LV P-EDP 113/5-21 mmHg; AO P-MAP 104/68-81 mmHg.  Ao sat 90%, PA sat 68%. PLAN OF CARE:  Patient continues to be inpatient.  I have written for IV Lasix 60 mg to be given today post cath (early PM dose).  Will continue with IV diuresis given elevated LVEDP and PCWP - INCREASE TO 60mg  BID starting tonite.  DAPT for minimum 1 year but would like to continue Thienopyridine for lifetime based on length of stent in the LAD.  Continue to titrate GDMT for CAD and CM/CHF.  Phase 2 [facility] ordered. Bryan Lemma, MD  DG Abd 1 View  Result Date: 12/13/2022 CLINICAL DATA:  Evaluate for free air. EXAM: ABDOMEN - 1 VIEW COMPARISON:  CT from prior day. FINDINGS: The bowel gas pattern is normal. No supine evidence for pneumoperitoneum. No radio-opaque calculi or other significant radiographic abnormality are seen. IMPRESSION: 1. Nonobstructive bowel gas pattern. 2. No supine evidence for pneumoperitoneum. Electronically Signed   By: Signa Kell M.D.   On: 12/13/2022 14:15   ECHOCARDIOGRAM COMPLETE  Result Date: 12/13/2022    ECHOCARDIOGRAM REPORT   Patient Name:   Whitney Knight Date of Exam: 12/13/2022 Medical Rec #:  [id number]        Height:       64.0 in Accession #:    [id number]       Weight:       148.1 lb Date of Birth:  02-12-1951        BSA:          1.722 m Patient Age:    72 years         BP:           118/71 mmHg Patient Gender: F  HR:           69 bpm. Exam Location:  [facility] Procedure: 2D Echo, 3D Echo, Cardiac Doppler, Color Doppler, Strain Analysis and            Intracardiac Opacification Agent Indications:     CHF  History:         Patient has no prior history of Echocardiogram examinations.                  CHF, Signs/Symptoms:Shortness of Breath; Risk                  Factors:Hypertension, Diabetes and Dyslipidemia.  Sonographer:     Mikki Harbor Referring Phys:  [phone] Antonieta Iba Diagnosing Phys: Yvonne Kendall MD  Sonographer Comments: Global  longitudinal strain was attempted. IMPRESSIONS  1. Left ventricular ejection fraction, by estimation, is 30 to 35%. The left ventricle has moderately decreased function. The left ventricle demonstrates regional wall motion abnormalities (see scoring diagram/findings for description). Left ventricular  diastolic parameters are consistent with Grade II diastolic dysfunction (pseudonormalization). Elevated left atrial pressure. The average left ventricular global longitudinal strain is -6.7 %. The global longitudinal strain is abnormal.  2. Right ventricular systolic function is normal. The right ventricular size is normal. There is normal pulmonary artery systolic pressure.  3. Left atrial size was mildly dilated.  4. Moderate pleural effusion in both left and right lateral regions.  5. The mitral valve is abnormal. Mild to moderate mitral valve regurgitation. No evidence of mitral stenosis.  6. Tricuspid valve regurgitation is moderate.  7. The aortic valve is tricuspid. Aortic valve regurgitation is not visualized. No aortic stenosis is present.  8. The inferior vena cava is normal in size with greater than 50% respiratory variability, suggesting right atrial pressure of 3 mmHg. Conclusion(s)/Recommendation(s): No left ventricular mural or apical thrombus/thrombi. FINDINGS  Left Ventricle: Left ventricular ejection fraction, by estimation, is 30 to 35%. The left ventricle has moderately decreased function. The left ventricle demonstrates regional wall motion abnormalities. Definity contrast agent was given IV to delineate the left ventricular endocardial borders. The average left ventricular global longitudinal strain is -6.7 %. The global longitudinal strain is abnormal. The left ventricular internal cavity size was normal in size. There is no left ventricular hypertrophy. Left ventricular diastolic parameters are consistent with Grade II diastolic dysfunction (pseudonormalization). Elevated left atrial pressure.   LV Wall Scoring: The mid and distal anterior septum, apical lateral segment, apical anterior segment, and apex are akinetic. The mid and distal inferior wall, mid inferolateral segment, mid anterolateral segment, mid inferoseptal segment, and mid anterior segment are hypokinetic. The basal anteroseptal segment, basal inferolateral segment, basal anterolateral segment, basal anterior segment, basal inferior segment, and basal inferoseptal segment are normal. Right Ventricle: The right ventricular size is normal. No increase in right ventricular wall thickness. Right ventricular systolic function is normal. There is normal pulmonary artery systolic pressure. The tricuspid regurgitant velocity is 2.59 m/s, and  with an assumed right atrial pressure of 3 mmHg, the estimated right ventricular systolic pressure is 29.8 mmHg. Left Atrium: Left atrial size was mildly dilated. Right Atrium: Right atrial size was normal in size. Pericardium: There is no evidence of pericardial effusion. Mitral Valve: The mitral valve is abnormal. There is mild thickening of the mitral valve leaflet(s). Mild to moderate mitral valve regurgitation. No evidence of mitral valve stenosis. MV peak gradient, 8.0 mmHg. The mean mitral valve gradient is 3.0 mmHg. Tricuspid Valve: The tricuspid valve  is normal in structure. Tricuspid valve regurgitation is moderate. Aortic Valve: The aortic valve is tricuspid. Aortic valve regurgitation is not visualized. No aortic stenosis is present. Aortic valve mean gradient measures 3.8 mmHg. Aortic valve peak gradient measures 7.6 mmHg. Aortic valve area, by VTI measures 1.91 cm. Pulmonic Valve: The pulmonic valve was normal in structure. Pulmonic valve regurgitation is trivial. No evidence of pulmonic stenosis. Aorta: The aortic root is normal in size and structure. Pulmonary Artery: The pulmonary artery is of normal size. Venous: The inferior vena cava is normal in size with greater than 50% respiratory  variability, suggesting right atrial pressure of 3 mmHg. IAS/Shunts: No atrial level shunt detected by color flow Doppler. Additional Comments: There is a moderate pleural effusion in both left and right lateral regions.  LEFT VENTRICLE PLAX 2D LVIDd:         4.50 cm     Diastology LVIDs:         3.30 cm     LV e' medial:    6.53 cm/s LV PW:         0.90 cm     LV E/e' medial:  17.2 LV IVS:        1.00 cm     LV e' lateral:   8.81 cm/s LVOT diam:     1.70 cm     LV E/e' lateral: 12.7 LV SV:         45 LV SV Index:   26          2D Longitudinal Strain LVOT Area:     2.27 cm    2D Strain GLS Avg:     -6.7 %  LV Volumes (MOD) LV vol d, MOD A4C: 81.8 ml LV vol s, MOD A4C: 53.2 ml LV SV MOD A4C:     81.8 ml RIGHT VENTRICLE RV Basal diam:  3.20 cm RV Mid diam:    2.30 cm LEFT ATRIUM             Index        RIGHT ATRIUM           Index LA diam:        3.90 cm 2.26 cm/m   RA Area:     12.70 cm LA Vol (A2C):   59.1 ml 34.32 ml/m  RA Volume:   31.00 ml  18.00 ml/m LA Vol (A4C):   54.5 ml 31.65 ml/m LA Biplane Vol: 59.5 ml 34.55 ml/m  AORTIC VALVE                    PULMONIC VALVE AV Area (Vmax):    2.00 cm     PV Vmax:       0.98 m/s AV Area (Vmean):   1.80 cm     PV Peak grad:  3.8 mmHg AV Area (VTI):     1.91 cm AV Vmax:           138.19 cm/s AV Vmean:          87.630 cm/s AV VTI:            0.234 m AV Peak Grad:      7.6 mmHg AV Mean Grad:      3.8 mmHg LVOT Vmax:         122.00 cm/s LVOT Vmean:        69.600 cm/s LVOT VTI:          0.197 m LVOT/AV VTI ratio: 0.84  AORTA Ao Root diam: 2.70 cm MITRAL VALVE                TRICUSPID VALVE MV Area (PHT): 5.93 cm     TR Peak grad:   26.8 mmHg MV Area VTI:   1.64 cm     TR Vmax:        259.00 cm/s MV Peak grad:  8.0 mmHg MV Mean grad:  3.0 mmHg     SHUNTS MV Vmax:       1.41 m/s     Systemic VTI:  0.20 m MV Vmean:      80.2 cm/s    Systemic Diam: 1.70 cm MV Decel Time: 128 msec MV E velocity: 112.00 cm/s MV A velocity: 90.10 cm/s MV E/A ratio:  1.24 Yvonne Kendall  MD Electronically signed by Yvonne Kendall MD Signature Date/Time: 12/13/2022/11:02:30 AM    Final    CT Angio Chest PE W/Cm &/Or Wo Cm  Result Date: 12/12/2022 CLINICAL DATA:  Rule out pulmonary embolism.  Shortness of breath. EXAM: CT ANGIOGRAPHY CHEST WITH CONTRAST TECHNIQUE: Multidetector CT imaging of the chest was performed using the standard protocol during bolus administration of intravenous contrast. Multiplanar CT image reconstructions and MIPs were obtained to evaluate the vascular anatomy. RADIATION DOSE REDUCTION: This exam was performed according to the departmental dose-optimization program which includes automated exposure control, adjustment of the mA and/or kV according to patient size and/or use of iterative reconstruction technique. CONTRAST:  75mL OMNIPAQUE IOHEXOL 350 MG/ML SOLN COMPARISON:  None Available. FINDINGS: Cardiovascular: Satisfactory opacification of the pulmonary arteries to the segmental level. No evidence of pulmonary embolism. Normal heart size. No pericardial effusion. Mild aortic atherosclerosis and coronary artery calcification. No pericardial effusion. Mediastinum/Nodes: Thyroid gland and trachea appear normal. Unremarkable appearance of the esophagus. No mediastinal or hilar adenopathy. Lungs/Pleura: Small to moderate bilateral pleural effusions. Atelectasis and airspace consolidation noted in the right middle lobe and right lower lobe. Signs of interstitial edema with interlobular septal thickening and diffuse hazy ground-glass attenuation is noted bilaterally. Areas of subsegmental atelectasis are identified within the lingula and posterior left lower lobe. Upper Abdomen: Several foci of gas are identified overlying the anterolateral margin of the right hepatic lobe for which pneumoperitoneum cannot be excluded, image 149/4. Musculoskeletal: Multiple healed right rib fractures identified. No acute or suspicious osseous findings. Review of the MIP images confirms the  above findings. IMPRESSION: 1. No evidence for acute pulmonary embolus. 2. Small to moderate bilateral pleural effusions. Diffuse interstitial edema is identified. Correlate for signs/symptoms of congestive heart failure. 3. Atelectasis and airspace consolidation noted in the right middle lobe and right lower lobe. 4. Several foci of gas are identified overlying the anterolateral margin of the right hepatic lobe for which pneumoperitoneum cannot be excluded. 5. Coronary artery calcification. 6.  Aortic Atherosclerosis (ICD10-I70.0). Critical Value/emergent results were called by telephone at the time of interpretation on 12/12/2022 at 6:53 pm to provider [facility] , who verbally acknowledged these results. Electronically Signed   By: Signa Kell M.D.   On: 12/12/2022 18:53        Scheduled Meds:  [MAR Hold] aspirin EC  81 mg Oral Daily   [MAR Hold] dapagliflozin propanediol  10 mg Oral Daily   [MAR Hold] enoxaparin (LOVENOX) injection  40 mg Subcutaneous Q24H   [MAR Hold] fluticasone  2 spray Each Nare Daily   [START ON 12/15/2022] furosemide  40 mg Intravenous BID   [START ON 12/15/2022] furosemide  60 mg Intravenous  Once   [facility] Hold] insulin aspart  0-15 Units Subcutaneous TID WC   [MAR Hold] insulin glargine-yfgn  5 Units Subcutaneous QHS   [MAR Hold] levothyroxine  75 mcg Oral Q0600   [MAR Hold] losartan  12.5 mg Oral Daily   [MAR Hold] potassium chloride  40 mEq Oral Once   [MAR Hold] rosuvastatin  20 mg Oral Daily   sodium chloride flush  3 mL Intravenous Q12H   ticagrelor  90 mg Oral BID   Continuous Infusions:  sodium chloride 75 mL/hr at 12/14/22 1538   sodium chloride       LOS: 2 days     Silvano Bilis, MD [facility]   If 7PM-7AM, please contact night-coverage [email or web address] Password [facility] 12/14/2022, 4:56 PM

## 2022-12-14 NOTE — Progress Notes (Signed)
Transition of Care [facility]) - Inpatient Brief Assessment   Patient Details  Name: Whitney Knight MRN: [id number] Date of Birth: [birth year]  Transition of Care [facility]) CM/SW Contact:    Darolyn Rua, LCSW Phone Number: 12/14/2022, 11:38 AM   Clinical Narrative:  Patient presents to ED from home with SOB.   PCP Dr. Birdie Sons, has [facility] insurance. No identified needs at this time.   Please consult TOC should discharge planning needs arise.   Transition of Care Asessment: Insurance and Status: Insurance coverage has been reviewed Patient has primary care physician: Yes Home environment has been reviewed: from home Prior level of function:: independent Prior/Current Home Services: No current home services Social Determinants of Health Reivew: SDOH reviewed no interventions necessary Readmission risk has been reviewed: Yes Transition of care needs: no transition of care needs at this time

## 2022-12-14 NOTE — Progress Notes (Signed)
[facility] HF Stewardship  PCP: Glori Luis, MD  PCP-Cardiologist: None  HPI: Whitney Knight is a 72 y.o. female with hypothyroidism, T2DM, HTN, and Raynaud's who presented with shortness of breath. Reported feeling general malaise & sore throat on 12/02/22 that progressed over the next several days to abdominal distension, LEE, orthopnea, and shortness of breath, and diarrhea. She was diagnosed with new onset CHF. Also found to have gas foci in right hepatic lobe. CTA 8/13 negative for PE. BNP 1663 on admission. Echocardiogram 12/13/22 showed LVEF of 30-35% with grade II diastolic dysfunction and normal RV function. Troponin elevated to 2024 on 12/13/22. R/LHC scheduled for 12/14/22. Negative for COVID-19 and RVP.   Pertinent Lab Values: BUN  Date Value Ref Range Status  12/14/2022 15 8 - 23 mg/dL Final  81/19/1478 19 8 - 27 mg/dL Final   Potassium  Date Value Ref Range Status  12/14/2022 2.8 (L) 3.5 - 5.1 mmol/L Final   Sodium  Date Value Ref Range Status  12/14/2022 133 (L) 135 - 145 mmol/L Final  05/04/2021 138 134 - 144 mmol/L Final   Magnesium  Date Value Ref Range Status  12/13/2022 1.8 1.7 - 2.4 mg/dL Final    Comment:    Performed at [facility], [address] [place], [place] [address]   TSH  Date Value Ref Range Status  12/12/2022 2.723 0.350 - 4.500 uIU/mL Final    Comment:    Performed by a 3rd Generation assay with a functional sensitivity of <=0.01 uIU/mL. Performed at [facility], [address] [place], [place] [address]   05/18/2022 2.01 0.35 - 5.50 uIU/mL Final    Vital Signs: Temp:  [97.5 F (36.4 C)-98.6 F (37 C)] 98.1 F (36.7 C) (08/15 0439) Pulse Rate:  [78-88] 78 (08/15 0439) Cardiac Rhythm: Normal sinus rhythm (08/14 1900) Resp:  [18-24] 18 (08/15 0439) BP: (104-119)/(59-73) 119/73 (08/15 0439) SpO2:  [98 %-100 %] 98 % (08/15 0439)   Intake/Output Summary (Last 24 hours) at 12/14/2022 0742 Last data filed at  12/14/2022 5784 Gross per 24 hour  Intake 120 ml  Output 1225 ml  Net -1105 ml    Current Inpatient Medications:  Loop Diuretic: Furosemide 40 mg IV BID ACE/ARB/ARNI: Losartan 12.5 mg daily - pt refusing due to intolerable cough  Prior to admission HF Medications:  None  Assessment: 1. Acute heart failure (LVEF 30-35%), due to unknown etiology. NYHA class II-III symptoms.  -BP stable. Creatinine ok. Hyponatremia secondary to volume overload, improving with diuresis. Hypokalemic this AM. 40 meq Kcl x 1 ordered, but will likely need more with IV diuresis. -Reports intolerable cough with losartan in the past. Can consider titrating other GDMT first, then trialing alternative ARB or ARNI.  Plan: 1) Medication changes recommended at this time: -Add Farxiga 10 mg daily. Further adjustment after cath. -Maintain strict I/Os, daily weights, Mg >2, and K >4. Consider another potassium 40 meq PO.  2) Patient assistance: Marcelline Deist copay $146.48 Jardiance copay $153.73 Copays high due to coverage gap  3) Education: -- Patient has been educated on current HF medications and potential additions to HF medication regimen - Patient verbalizes understanding that over the next few months, these medication doses may change and more medications may be added to optimize HF regimen - Patient has been educated on basic disease state pathophysiology and goals of therapy  Medication Assistance / Insurance Benefits Check:  Does the patient have prescription insurance? Prescription Insurance: Medicare ([employer])  Type of insurance  plan:   Does the patient qualify for medication assistance through manufacturers or grants? Pending   Eligible grants and/or patient assistance programs:Pending   Medication assistance applications in progress: Pending   Medication assistance applications approved: Pending  Approved medication assistance renewals will be completed by: Pending  Outpatient  Pharmacy:  [facility]  Thank you for involving pharmacy in this patient's care.  Enos Fling, PharmD, BCPS Clinical Pharmacist 12/14/2022 7:42 AM

## 2022-12-14 NOTE — Interval H&P Note (Signed)
History and Physical Interval Note:  12/14/2022 1:21 PM  [name]  has presented today for surgery, with the diagnosis of new onset heart failure with possible viral myocarditis.  The various methods of treatment have been discussed with the patient and family. After consideration of risks, benefits and other options for treatment, the patient has consented to  Procedure(s): RIGHT/LEFT HEART CATH AND CORONARY ANGIOGRAPHY (N/A)  PERCUTANEOUS CUTANEOUS CORONARY INTERVENTION  as a surgical intervention.  The patient's history has been reviewed, patient examined, no change in status, stable for surgery.  I have reviewed the patient's chart and labs.  Questions were answered to the patient's satisfaction.    Cath Lab Visit (complete for each Cath Lab visit)  Clinical Evaluation Leading to the Procedure:   ACS: No.  Non-ACS:    Anginal Classification: CCS III - chf  Anti-ischemic medical therapy: Minimal Therapy (1 class of medications)  Non-Invasive Test Results: Equivocal test results - Nedw DX CM  Prior CABG: No previous CABG    Bryan Lemma

## 2022-12-15 ENCOUNTER — Other Ambulatory Visit (HOSPITAL_COMMUNITY): Payer: Self-pay

## 2022-12-15 ENCOUNTER — Encounter: Payer: Self-pay | Admitting: Cardiology

## 2022-12-15 DIAGNOSIS — I5021 Acute systolic (congestive) heart failure: Secondary | ICD-10-CM | POA: Diagnosis not present

## 2022-12-15 LAB — BASIC METABOLIC PANEL
Anion gap: 14 (ref 5–15)
BUN: 18 mg/dL (ref 8–23)
CO2: 28 mmol/L (ref 22–32)
Calcium: 8.2 mg/dL — ABNORMAL LOW (ref 8.9–10.3)
Chloride: 95 mmol/L — ABNORMAL LOW (ref 98–111)
Creatinine, Ser: 0.97 mg/dL (ref 0.44–1.00)
GFR, Estimated: >60 mL/min (ref >60)
Glucose, Bld: 108 mg/dL — ABNORMAL HIGH (ref 70–99)
Potassium: 2.7 mmol/L — CL (ref 3.5–5.1)
Sodium: 137 mmol/L (ref 135–145)

## 2022-12-15 LAB — POTASSIUM: Potassium: 4.4 mmol/L (ref 3.5–5.1)

## 2022-12-15 LAB — CBC
HCT: 42.5 % (ref 36.0–46.0)
Hemoglobin: 13.8 g/dL (ref 12.0–15.0)
MCH: 28 pg (ref 26.0–34.0)
MCHC: 32.5 g/dL (ref 30.0–36.0)
MCV: 86.4 fL (ref 80.0–100.0)
Platelets: 299 10*3/uL (ref 150–400)
RBC: 4.92 MIL/uL (ref 3.87–5.11)
RDW: 14.4 % (ref 11.5–15.5)
WBC: 8.9 10*3/uL (ref 4.0–10.5)
nRBC: 0 % (ref 0.0–0.2)

## 2022-12-15 LAB — MAGNESIUM: Magnesium: 2.3 mg/dL (ref 1.7–2.4)

## 2022-12-15 LAB — GLUCOSE, CAPILLARY
Glucose-Capillary: 118 mg/dL — ABNORMAL HIGH (ref 70–99)
Glucose-Capillary: 109 mg/dL — ABNORMAL HIGH (ref 70–99)
Glucose-Capillary: 135 mg/dL — ABNORMAL HIGH (ref 70–99)
Glucose-Capillary: 174 mg/dL — ABNORMAL HIGH (ref 70–99)

## 2022-12-15 MED ORDER — POTASSIUM CHLORIDE 20 MEQ PO PACK
40.0000 meq | PACK | ORAL | Status: AC
  Administered 2022-12-15 (×2): 40 meq via ORAL
  Filled 2022-12-15 (×2): qty 2

## 2022-12-15 MED ORDER — POTASSIUM CHLORIDE 10 MEQ/100ML IV SOLN
10.0000 meq | INTRAVENOUS | Status: DC
  Administered 2022-12-15: 10 meq via INTRAVENOUS
  Filled 2022-12-15 (×4): qty 100

## 2022-12-15 MED ORDER — POTASSIUM CHLORIDE CRYS ER 20 MEQ PO TBCR
40.0000 meq | EXTENDED_RELEASE_TABLET | Freq: Once | ORAL | Status: AC
  Administered 2022-12-15: 40 meq via ORAL
  Filled 2022-12-15: qty 2

## 2022-12-15 MED ORDER — FUROSEMIDE 40 MG PO TABS
40.0000 mg | ORAL_TABLET | Freq: Every day | ORAL | Status: DC
  Administered 2022-12-16: 40 mg via ORAL
  Filled 2022-12-15: qty 1

## 2022-12-15 MED ORDER — CARVEDILOL 3.125 MG PO TABS
3.1250 mg | ORAL_TABLET | Freq: Two times a day (BID) | ORAL | Status: DC
  Administered 2022-12-15 – 2022-12-16 (×2): 3.125 mg via ORAL
  Filled 2022-12-15 (×2): qty 1

## 2022-12-15 MED ORDER — SPIRONOLACTONE 12.5 MG HALF TABLET
12.5000 mg | ORAL_TABLET | Freq: Every day | ORAL | Status: DC
  Administered 2022-12-15 – 2022-12-16 (×2): 12.5 mg via ORAL
  Filled 2022-12-15 (×2): qty 1

## 2022-12-15 MED ORDER — POTASSIUM CHLORIDE CRYS ER 20 MEQ PO TBCR: 60.0000 meq | EXTENDED_RELEASE_TABLET | Freq: Once | ORAL | Status: DC

## 2022-12-15 NOTE — Progress Notes (Signed)
Whitney Knight with [employer] reports referral given by cards for [employer] for patient, requested clinicals faxed to [phone] and emailed to [email or web address]  Darolyn Rua, LCSW, MSW, [facility] [phone]

## 2022-12-15 NOTE — TOC Benefit Eligibility Note (Signed)
Pharmacy Patient Advocate Encounter  Insurance verification completed.    The patient is insured through [employer] Patient has Medicare and is not eligible for a copay card, but may be able to apply for patient assistance, if available.    Ran test claim for Brilinta and the current 30 day co-pay is $113.56.   This test claim was processed through [facility]- copay amounts may vary at other pharmacies due to pharmacy/plan contracts, or as the patient moves through the different stages of their insurance plan.

## 2022-12-15 NOTE — Progress Notes (Signed)
[facility] HF Stewardship  PCP: Glori Luis, MD  PCP-Cardiologist: None  HPI: Whitney Knight is a 72 y.o. female with hypothyroidism, T2DM, HTN, and Raynaud's who presented with shortness of breath. Reported feeling general malaise & sore throat on 12/02/22 that progressed over the next several days to abdominal distension, LEE, orthopnea, and shortness of breath, and diarrhea. She was diagnosed with new onset CHF. Also found to have gas foci in right hepatic lobe. CTA 8/13 negative for PE. BNP 1663 on admission. Echocardiogram 12/13/22 showed LVEF of 30-35% with grade II diastolic dysfunction and normal RV function. Troponin elevated to 2024 on 12/13/22. R/LHC scheduled for 12/14/22. Negative for COVID-19 and RVP. LHC on 12/14/22 showed 100% stenosis of prox-mid LAD treated with DES. RHC 12/14/22 showed CO 5.44, CI 3.16, RAP 5 mmHg; RV P-EDP 46/0-8 mmHg; PAP-mean 46/19 (32) mmHg (mild to moderate WHO class II-LV CHF related), PCWP 28 mmHg, LV P-EDP 113/5 (21) mmHg, AO P-MAP 104/68-81 mmHg, Ao sat 90%, and PA sat 68%.   Pertinent Lab Values: BUN  Date Value Ref Range Status  12/15/2022 18 8 - 23 mg/dL Final  65/78/4696 19 8 - 27 mg/dL Final   Potassium  Date Value Ref Range Status  12/15/2022 2.7 (LL) 3.5 - 5.1 mmol/L Final    Comment:    CRITICAL RESULT CALLED TO, READ BACK BY AND VERIFIED WITH MARCIR KIMIEY 12/15/22 0604 MW    Sodium  Date Value Ref Range Status  12/15/2022 137 135 - 145 mmol/L Final  05/04/2021 138 134 - 144 mmol/L Final   Magnesium  Date Value Ref Range Status  12/15/2022 2.3 1.7 - 2.4 mg/dL Final    Comment:    Performed at [facility], [address] [place], [place] [address]   TSH  Date Value Ref Range Status  12/12/2022 2.723 0.350 - 4.500 uIU/mL Final    Comment:    Performed by a 3rd Generation assay with a functional sensitivity of <=0.01 uIU/mL. Performed at [facility], [address] [place], [place] [address]   05/18/2022  2.01 0.35 - 5.50 uIU/mL Final    Vital Signs: Temp:  [97.3 F (36.3 C)-98.3 F (36.8 C)] 97.8 F (36.6 C) (08/16 0756) Pulse Rate:  [78-99] 81 (08/16 0756) Cardiac Rhythm: Normal sinus rhythm (08/16 0830) Resp:  [14-24] 18 (08/16 0756) BP: (89-116)/(51-89) 108/72 (08/16 0756) SpO2:  [87 %-100 %] 100 % (08/16 0756) Weight:  [61.9 kg (136 lb 7.4 oz)] 61.9 kg (136 lb 7.4 oz) (08/16 0500)   Intake/Output Summary (Last 24 hours) at 12/15/2022 0855 Last data filed at 12/14/2022 2153 Gross per 24 hour  Intake 445 ml  Output --  Net 445 ml    Current Inpatient Medications:  Loop Diuretic: Furosemide 40 mg IV BID ACE/ARB/ARNI: Losartan 12.5 mg daily - pt refusing due to intolerable cough  Prior to admission HF Medications:  None  Assessment: 1. Acute heart failure (LVEF 30-35%), due to unknown etiology. NYHA class II-III symptoms.  -BP low stable. No dizziness while ambulating with PT/OT. Creatinine ok. Hyponatremia improved with diuresis, improving with diuresis. -Hypokalemia: IV Kcl infiltrated this AM. Replacing orally. Unable to swallow 20 meq pills, so changed to powder. Would benefit from spironolactone. -Reports intolerable cough with losartan in the past. Can consider titrating other GDMT first, then trialing alternative ARB or ARNI.  Plan: 1) Medication changes recommended at this time: -Consider adding spironolactone 12.5 mg daily -Maintain strict I/Os, daily weights, Mg >2, and K >4.  Consider another potassium 40 meq PO.  2) Patient assistance: Marcelline Deist copay $146.48 Jardiance copay $153.73 Copays high due to coverage gap Eligible for 30 day free of Brillinta and Farxiga  3) Education: -- Patient has been educated on current HF medications and potential additions to HF medication regimen - Patient verbalizes understanding that over the next few months, these medication doses may change and more medications may be added to optimize HF regimen - Patient has been  educated on basic disease state pathophysiology and goals of therapy  Medication Assistance / Insurance Benefits Check:  Does the patient have prescription insurance? Prescription Insurance: Medicare ([employer])  Type of insurance plan:   Does the patient qualify for medication assistance through manufacturers or grants? No  Approved medication assistance renewals will be completed by: Pending  Outpatient Pharmacy:  [facility]  Thank you for involving pharmacy in this patient's care.  Enos Fling, PharmD, BCPS Clinical Pharmacist 12/15/2022 8:55 AM

## 2022-12-15 NOTE — Progress Notes (Signed)
Progress Note  Patient Name: Whitney Knight Date of Encounter: 12/15/2022  Primary Cardiologist: New - consult by Dr. Okey Dupre, MD  Subjective   R/LHC on 12/14/22 showed severe single vessel CAD with 100% occlusion of the proximal to mid LAD at SP1. She underwent successful, but difficult, PCI/DES of the LAD. RHC showed RAP 5 mmHg; RV P-EDP 46/0-8 mmHg; PAP-mean 46/19-32 mmHg, PCWP 28 mmHg. LV P-EDP 113/5-21 mmHg; AO P-MAP 104/68-81 mmHg. Ao sat 90%, PA sat 68%. Given IV Lasix 60 mg post cath with vigorous UOP. Net - 1 L for the admission. Renal function stable post cath. Potassium remains low at 2.7 (IV potassium noted to have infiltrated).  No chest pain or dyspnea. Some bruising noted along the right radial arteriotomy site.   Inpatient Medications    Scheduled Meds:  aspirin EC  81 mg Oral Daily   dapagliflozin propanediol  10 mg Oral Daily   enoxaparin (LOVENOX) injection  40 mg Subcutaneous Q24H   fluticasone  2 spray Each Nare Daily   furosemide  40 mg Intravenous BID   insulin aspart  0-15 Units Subcutaneous TID WC   insulin glargine-yfgn  5 Units Subcutaneous QHS   levothyroxine  75 mcg Oral Q0600   losartan  12.5 mg Oral Daily   potassium chloride  40 mEq Oral Q2H   rosuvastatin  20 mg Oral Daily   sodium chloride flush  3 mL Intravenous Q12H   ticagrelor  90 mg Oral BID   Continuous Infusions:  sodium chloride     PRN Meds: sodium chloride, acetaminophen, albuterol, ondansetron (ZOFRAN) IV, sodium chloride flush   Vital Signs    Vitals:   12/14/22 1918 12/15/22 0433 12/15/22 0500 12/15/22 0756  BP: (!) 107/58 116/72  108/72  Pulse: 85 81  81  Resp: 19 17  18   Temp: 98.3 F (36.8 C) 97.8 F (36.6 C)  97.8 F (36.6 C)  TempSrc:      SpO2: 100% 99%  100%  Weight:   61.9 kg   Height:        Intake/Output Summary (Last 24 hours) at 12/15/2022 1126 Last data filed at 12/15/2022 1056 Gross per 24 hour  Intake 805 ml  Output --  Net 805 ml   Filed Weights    12/12/22 2100 12/13/22 0532 12/15/22 0500  Weight: (!) 150 kg 67.2 kg 61.9 kg    Telemetry    SR with sinus arrhythmia with occasional isolated PVCs, artifact, 70s-80s bpm - Personally Reviewed  ECG    NSR, 82 bpm, nonspecific IVCD, prior septal infarct - Personally Reviewed  Physical Exam   GEN: No acute distress.   Neck: No JVD. Cardiac: RRR, II/VI systolic murmur LUSB, no rubs, or gallops. Right radial arteriotomy site is without active bleeding or swelling. Some bruising noted. No TTP or erythema. Radial pulse 2+ proximal and distal to the arteriotomy site.  Respiratory: Diminished breath sounds along the bases bilaterally.  GI: Soft, nontender, non-distended.   MS: No edema; No deformity. Neuro:  Alert and oriented x 3; Nonfocal.  Psych: Normal affect.  Labs    Chemistry Recent Labs  Lab 12/13/22 0208 12/14/22 0629 12/14/22 1148 12/14/22 1337 12/14/22 1347 12/15/22 0504  NA 127* 133*  --  135 134* 137  K 3.5 2.8*   < > 3.0* 2.9* 2.7*  CL 90* 96*  --   --   --  95*  CO2 26 29  --   --   --  28  GLUCOSE 134* 115*  --   --   --  108*  BUN 19 15  --   --   --  18  CREATININE 0.90 0.88  --   --   --  0.97  CALCIUM 8.2* 7.8*  --   --   --  8.2*  GFRNONAA >60 >60  --   --   --  >60  ANIONGAP 11 9  --   --   --  14   < > = values in this interval not displayed.     Hematology Recent Labs  Lab 12/13/22 0208 12/14/22 0629 12/14/22 1337 12/14/22 1347 12/15/22 0504  WBC 11.3* 8.0  --   --  8.9  RBC 4.24 4.37  --   --  4.92  HGB 11.9* 12.2 12.9 12.9 13.8  HCT 35.7* 36.8 38.0 38.0 42.5  MCV 84.2 84.2  --   --  86.4  MCH 28.1 27.9  --   --  28.0  MCHC 33.3 33.2  --   --  32.5  RDW 13.9 14.2  --   --  14.4  PLT 319 326  --   --  299    Cardiac EnzymesNo results for input(s): "TROPONINI" in the last 168 hours. No results for input(s): "TROPIPOC" in the last 168 hours.   BNP Recent Labs  Lab 12/12/22 1229  PROBNP 1,663.0*     DDimer No results for  input(s): "DDIMER" in the last 168 hours.   Radiology    DG Abd 1 View  Result Date: 12/13/2022 IMPRESSION: 1. Nonobstructive bowel gas pattern. 2. No supine evidence for pneumoperitoneum. Electronically Signed   By: Signa Kell M.D.   On: 12/13/2022 14:15   CT Angio Chest PE W/Cm &/Or Wo Cm  Result Date: 12/12/2022 IMPRESSION: 1. No evidence for acute pulmonary embolus. 2. Small to moderate bilateral pleural effusions. Diffuse interstitial edema is identified. Correlate for signs/symptoms of congestive heart failure. 3. Atelectasis and airspace consolidation noted in the right middle lobe and right lower lobe. 4. Several foci of gas are identified overlying the anterolateral margin of the right hepatic lobe for which pneumoperitoneum cannot be excluded. 5. Coronary artery calcification. 6.  Aortic Atherosclerosis (ICD10-I70.0). Critical Value/emergent results were called by telephone at the time of interpretation on 12/12/2022 at 6:53 pm to provider [facility] , who verbally acknowledged these results. Electronically Signed   By: Signa Kell M.D.   On: 12/12/2022 18:53   DG Chest 2 View  Addendum Date: 12/12/2022   ADDENDUM REPORT: 12/12/2022 16:09 ADDENDUM: Corrected report: Original report was generated with an error due to voice recognition software, corrected as follows: Comparison dated chest x-ray dated May 15, 2016, rib series dated May 15, 2017 Electronically Signed   By: Allegra Lai M.D.   On: 12/12/2022 16:09   Result Date: 12/12/2022 IMPRESSION: 1. New asymmetric fullness of the right hilum. Recommend contrast-enhanced chest CT to exclude right hilar mass. 2. Small right-greater-than-left pleural effusions. Electronically Signed: By: Allegra Lai M.D. On: 12/12/2022 15:22    Cardiac Studies   Calcium score 02/24/2021: IMPRESSION AND RECOMMENDATION: 1. Coronary calcium score of 37.1. This was 64th percentile for age and sex matched control. 2. CAC 1-99 in  LAD.  CAC-DRS A1/N1. 3. Continue heart healthy lifestyle and risk factor modification. 4. Consider statin therapy due to Age >55. __________  2D echo 12/13/2022: 1. Left ventricular ejection fraction, by estimation, is 30 to 35%. The  left  ventricle has moderately decreased function. The left ventricle  demonstrates regional wall motion abnormalities (see scoring  diagram/findings for description). Left ventricular   diastolic parameters are consistent with Grade II diastolic dysfunction  (pseudonormalization). Elevated left atrial pressure. The average left  ventricular global longitudinal strain is -6.7 %. The global longitudinal  strain is abnormal.   2. Right ventricular systolic function is normal. The right ventricular  size is normal. There is normal pulmonary artery systolic pressure.   3. Left atrial size was mildly dilated.   4. Moderate pleural effusion in both left and right lateral regions.   5. The mitral valve is abnormal. Mild to moderate mitral valve  regurgitation. No evidence of mitral stenosis.   6. Tricuspid valve regurgitation is moderate.   7. The aortic valve is tricuspid. Aortic valve regurgitation is not  visualized. No aortic stenosis is present.   8. The inferior vena cava is normal in size with greater than 50%  respiratory variability, suggesting right atrial pressure of 3 mmHg.   Conclusion(s)/Recommendation(s): No left ventricular mural or apical thrombus/thrombi.  __________  [facility] 12/14/2022:   CULPRIT LESION: Prox LAD to Mid LAD lesion is 100% stenosed with 0% stenosed side branch in 1st Diag.   A drug-eluting stent was successfully placed using a STENT ONYX FRONTIER 2.5X30. => Taper postdilation from 3.1 to 2.8 mm   Post intervention, there is a 0% residual stenosis.  TIMI-3 flow restored.   Post intervention, the side branch was reduced to 0% residual stenosis distally (very small vessel); 1st Diag lesion is 100% stenosed distally.   Hemodynamic  findings consistent with mild pulmonary hypertension.   There is no aortic valve stenosis.   Anticipated discharge date to be determined.   Patient still has need for additional titration of CHF/C medications along with additional diuresis   Recommend uninterrupted dual antiplatelet therapy with Aspirin 81mg  daily and Ticagrelor 90mg  twice daily for a minimum of 12 months (ACS-Class I recommendation).   Would continue Thienopyridine monotherapy with either clopidogrel 75 mg daily or ticagrelor 60 mg twice daily long-term based on 30 mm stent in the proximal LAD.   POST-OPERATIVE DIAGNOSIS:   Severe single-vessel disease with 100% occlusion of the prox-mid LAD at SP1 otherwise no significant disease in a somewhat codominant system with the wraparound LAD providing the distal half to one third of the PDA territory. Difficult but successful DES PCI of the LAD with a Resolute Onyx DES 2.5 mm x 30 mm stent postdilated and tapered fashion from 3.1 to 2.8 mm.  2 small diagonal branches noted after stent with 1 small diagonal that barely feels that arose from the original occluded section. Consistent combined CHF with known EF of 30 to 35% and PCWP of 29 millimercury and LVEDP of 21 mL of mercury, but preserved cardiac output put and index with cardiac output of 5.44 and index of 3.16. RHC numbers: RAP 5 mmHg; RV P-EDP 46/0-8 mmHg; PAP-mean 46/19-32 mmHg (mild to moderate WHO class II-LV CHF related), PCWP 28 mmHg.  LV P-EDP 113/5-21 mmHg; AO P-MAP 104/68-81 mmHg.  Ao sat 90%, PA sat 68%.     PLAN OF CARE:  Patient continues to be inpatient.  I have written for IV Lasix 60 mg to be given today post cath (early PM dose).  Will continue with IV diuresis given elevated LVEDP and PCWP - INCREASE TO 60mg  BID starting tonite.  DAPT for minimum 1 year but would like to continue Thienopyridine for lifetime based on  length of stent in the LAD.  Continue to titrate GDMT for CAD and CM/CHF.  Phase 2 CRH ordered.    Patient Profile     72 y.o. female with history of DM2, HTN, HLD, hypothyroidism, and osteoporosis who was admitted on 12/12/2022 with progressive heart failure symptoms and we are seeing for likely viral myocarditis  Assessment & Plan    1. Acute HFrEF with NSTEMI: -Currently, without chest pain or dyspnea -Status post challenging PCI to the LAD on 12/14/2022 with recommendation for DAPT with ASA 81 mg daily and Brilinta 90 mg bid without interruption for a minimum of 1 year from PCI and likely continued thienopyridine for lifetime based on length of stent in the LAD  -Farxiga, noted nausea/vomiting with Jardiance -Declines losartan due to cough -Defer ARB/ACEi/ARNI due to cough -As BP allows, start beta blocker -Escalate GDMT prior to discharge an in follow up as able, pending BP -Continue IV Lasix 40 mg bid -Strict I/O -Daily weights  -Cardiac rehab -Post cath instructions  -CHF education  2. HTN: -Blood pressure stable -Medical therapy as outlined above  3. HLD: -LDL 87 this admission -Declines statin  -Consider [id number]  4. Hyponatremia: -Improved -Most likely due to her heart failure though SIADH is a consideration as well with recent respiratory illness   5. Hypokalemia: -Repletion ordered (IV infiltrated)      For questions or updates, please contact [facility] Please consult [email or web address] for contact info under Cardiology/STEMI.    Signed, Eula Listen, PA-C [facility] Pager: [phone] 12/15/2022, 11:26 AM

## 2022-12-15 NOTE — Progress Notes (Signed)
PROGRESS NOTE    Whitney Knight  FAO:[id number] DOB: 1951/02/12 DOA: 12/12/2022 PCP: Glori Luis, MD     Brief Narrative:   From admission h and p   Whitney Knight is a 72 y.o. female with medical history significant of hypertension, type 2 diabetes, hyperlipidemia, osteoporosis, hypothyroidism, who presents to the ED due to shortness of breath.   Mrs. Ganzer states she began to feel ill around August 3rd, with generalized malaise. She subsequently developed a sore throat and due to this, went to an urgent care.  She was told that she was negative for group A strep.  Then over the last 1 to 2 days, she has developed rapidly onset abdominal distention, lower extremity swelling, orthopnea, and shortness of breath.  She endorses diarrhea yesterday, but states it was after taking a laxative. No abdominal pain at this time. No prior history of heart failure.  She notes that she lost approximately 40 pounds intentionally and it seems that over the last 1 to 2 days, all the weight suddenly came back on.   Patient notes that she has been on a clean diet regimen. She drinks 3-4 packs of electrolytes supplements per day. On review of packets, total sodium = 855 mg per drink. We discussed this is very high salt intake.    Assessment & Plan:   Principal Problem:   Acute systolic heart failure (HCC) Active Problems:   Hyponatremia   Gas foci overlying anterolateral margin of the right hepatic lobe   Hypothyroidism   Hypertension   Uncontrolled type 2 diabetes mellitus with hyperglycemia (HCC)   Iron deficiency anemia   Acute pulmonary edema (HCC)   Cardiomegaly   Dilated cardiomyopathy (HCC)   Elevated troponin   Coronary artery disease   Ischemic cardiomyopathy  # Acute systolic CHF exacerbation # Ischemic cardiomyopathy # NSTEMI Several days swelling, bnp markedly elevated, pulm edema on CTA. No PE. Covid and RVP are negative. EF 30-35% on TTE. Cath 8/15 showing 100%  occlusion of the prox-mid LAD with DES placed - diuresis and gdmt per cardiology, plans on placing life vest prior to d/c as well  # Hyponatremia Likely hypervolemic from chf. Normalized w/ diuresis - monitor  # Gas foci overlying anterolateral margin of the right hepatic lobe Per admitting provider, "EDP discussed with radiology team, who is recommending repeat abdominal x-ray tomorrow unless patient becomes symptomatic, then will need CT of the abdomen." Continues to deny abdominal pain, no abdominal tenderness. KUB with no free air - monitor  # Hypothyroid Tsh wnl - cont home synthroid  # Hypokalemia 2.7 today, mg wnl - more potassium today - f/u afternoon K  # T2DM Euglycemic - holding home glimeperide, mounjaro - continue SSI  # HTN Here normotensive in setting of diuresis - cont to hold home hydrochlorothiazide and amlodipine  DVT prophylaxis: lovenox Code Status: full Family Communication: husband updated @ bedside 8/16  Level of care: Telemetry Cardiac Status is: Inpatient Remains inpatient appropriate because: severity of illness    Consultants:  cardiology  Procedures: Right/left heart cath 8/15  Antimicrobials:  none    Subjective: No chest pain or dyspnea  Objective: Vitals:   12/15/22 0433 12/15/22 0500 12/15/22 0756 12/15/22 1130  BP: 116/72  108/72 115/68  Pulse: 81  81 83  Resp: 17  18 18   Temp: 97.8 F (36.6 C)  97.8 F (36.6 C) 97.8 F (36.6 C)  TempSrc:    Oral  SpO2: 99%  100% 100%  Weight:  61.9 kg    Height:        Intake/Output Summary (Last 24 hours) at 12/15/2022 1329 Last data filed at 12/15/2022 1056 Gross per 24 hour  Intake 805 ml  Output --  Net 805 ml   Filed Weights   12/12/22 2100 12/13/22 0532 12/15/22 0500  Weight: (!) 150 kg 67.2 kg 61.9 kg    Examination:  General exam: Appears calm and comfortable  Respiratory system: Clear to auscultation. Respiratory effort normal. Cardiovascular system: S1 & S2  heard, RRR. No JVD, murmurs, rubs, gallops or clicks.  Gastrointestinal system: Abdomen is nondistended, soft and nontender. No organomegaly or masses felt. Normal bowel sounds heard. Central nervous system: Alert and oriented. No focal neurological deficits. Extremities: Symmetric 5 x 5 power, trace LE edema Skin: No rashes, lesions or ulcers Psychiatry: Judgement and insight appear normal. Mood & affect appropriate.     Data Reviewed: I have personally reviewed following labs and imaging studies  CBC: Recent Labs  Lab 12/12/22 1229 12/13/22 0208 12/14/22 0629 12/14/22 1337 12/14/22 1347 12/15/22 0504  WBC 13.6* 11.3* 8.0  --   --  8.9  NEUTROABS  --  8.3*  --   --   --   --   HGB 12.2 11.9* 12.2 12.9 12.9 13.8  HCT 37.8 35.7* 36.8 38.0 38.0 42.5  MCV 86.9 84.2 84.2  --   --  86.4  PLT 354.0 319 326  --   --  299   Basic Metabolic Panel: Recent Labs  Lab 12/12/22 1229 12/12/22 2316 12/13/22 0208 12/14/22 0629 12/14/22 1148 12/14/22 1337 12/14/22 1347 12/15/22 0504  NA 123* 126* 127* 133*  --  135 134* 137  K 3.7 3.8 3.5 2.8* 3.2* 3.0* 2.9* 2.7*  CL 89* 91* 90* 96*  --   --   --  95*  CO2 24 23 26 29   --   --   --  28  GLUCOSE 175* 173* 134* 115*  --   --   --  108*  BUN 17 22 19 15   --   --   --  18  CREATININE 0.95 0.97 0.90 0.88  --   --   --  0.97  CALCIUM 8.8 8.6* 8.2* 7.8*  --   --   --  8.2*  MG  --   --  1.8 2.2  --   --   --  2.3   GFR: Estimated Creatinine Clearance: 46.6 mL/min (by C-G formula based on SCr of 0.97 mg/dL). Liver Function Tests: No results for input(s): "AST", "ALT", "ALKPHOS", "BILITOT", "PROT", "ALBUMIN" in the last 168 hours. No results for input(s): "LIPASE", "AMYLASE" in the last 168 hours. No results for input(s): "AMMONIA" in the last 168 hours. Coagulation Profile: Recent Labs  Lab 12/14/22 0629  INR 1.3*   Cardiac Enzymes: No results for input(s): "CKTOTAL", "CKMB", "CKMBINDEX", "TROPONINI" in the last 168 hours. BNP  (last 3 results) Recent Labs    12/12/22 1229  PROBNP 1,663.0*   HbA1C: No results for input(s): "HGBA1C" in the last 72 hours. CBG: Recent Labs  Lab 12/14/22 1210 12/14/22 1520 12/14/22 2054 12/15/22 0757 12/15/22 1130  GLUCAP 87 83 120* 118* 109*   Lipid Profile: Recent Labs    12/14/22 0629  CHOL 141  HDL 36*  LDLCALC 87  TRIG 91  CHOLHDL 3.9   Thyroid Function Tests: Recent Labs    12/12/22 1944  TSH 2.723   Anemia Panel: No results  for input(s): "VITAMINB12", "FOLATE", "FERRITIN", "TIBC", "IRON", "RETICCTPCT" in the last 72 hours.  Urine analysis:    Component Value Date/Time   BILIRUBINUR neg 10/15/2017 0850   PROTEINUR Negative 10/15/2017 0850   UROBILINOGEN 0.2 10/15/2017 0850   NITRITE neg 10/15/2017 0850   LEUKOCYTESUR Negative 10/15/2017 0850   Sepsis Labs: @LABRCNTIP (procalcitonin:4,lacticidven:4)  ) Recent Results (from the past 240 hour(s))  Respiratory (~20 pathogens) panel by PCR     Status: None   Collection Time: 12/12/22  7:44 PM   Specimen: Nasopharyngeal Swab; Respiratory  Result Value Ref Range Status   Adenovirus NOT DETECTED NOT DETECTED Final   Coronavirus 229E NOT DETECTED NOT DETECTED Final    Comment: (NOTE) The Coronavirus on the Respiratory Panel, DOES NOT test for the novel  Coronavirus (2019 nCoV)    Coronavirus HKU1 NOT DETECTED NOT DETECTED Final   Coronavirus NL63 NOT DETECTED NOT DETECTED Final   Coronavirus OC43 NOT DETECTED NOT DETECTED Final   Metapneumovirus NOT DETECTED NOT DETECTED Final   Rhinovirus / Enterovirus NOT DETECTED NOT DETECTED Final   Influenza A NOT DETECTED NOT DETECTED Final   Influenza B NOT DETECTED NOT DETECTED Final   Parainfluenza Virus 1 NOT DETECTED NOT DETECTED Final   Parainfluenza Virus 2 NOT DETECTED NOT DETECTED Final   Parainfluenza Virus 3 NOT DETECTED NOT DETECTED Final   Parainfluenza Virus 4 NOT DETECTED NOT DETECTED Final   Respiratory Syncytial Virus NOT DETECTED NOT  DETECTED Final   Bordetella pertussis NOT DETECTED NOT DETECTED Final   Bordetella Parapertussis NOT DETECTED NOT DETECTED Final   Chlamydophila pneumoniae NOT DETECTED NOT DETECTED Final   Mycoplasma pneumoniae NOT DETECTED NOT DETECTED Final    Comment: Performed at [facility], [address] [place], [place] [address]  SARS Coronavirus 2 by RT PCR (hospital order, performed in [facility] hospital lab) *cepheid single result test* Anterior Nasal Swab     Status: None   Collection Time: 12/13/22  9:30 AM   Specimen: Anterior Nasal Swab  Result Value Ref Range Status   SARS Coronavirus 2 by RT PCR NEGATIVE NEGATIVE Final    Comment: (NOTE) SARS-CoV-2 target nucleic acids are NOT DETECTED.  The SARS-CoV-2 RNA is generally detectable in upper and lower respiratory specimens during the acute phase of infection. The lowest concentration of SARS-CoV-2 viral copies this assay can detect is 250 copies / mL. A negative result does not preclude SARS-CoV-2 infection and should not be used as the sole basis for treatment or other patient management decisions.  A negative result may occur with improper specimen collection / handling, submission of specimen other than nasopharyngeal swab, presence of viral mutation(s) within the areas targeted by this assay, and inadequate number of viral copies (<250 copies / mL). A negative result must be combined with clinical observations, patient history, and epidemiological information.  Fact Sheet for Patients:   RoadLapTop.co.za  Fact Sheet for Healthcare Providers: http://kim-miller.com/  This test is not yet approved or  cleared by the [place] FDA and has been authorized for detection and/or diagnosis of SARS-CoV-2 by FDA under an Emergency Use Authorization (EUA).  This EUA will remain in effect (meaning this test can be used) for the duration of the COVID-19 declaration under Section  564(b)(1) of the Act, 21 U.S.C. section 360bbb-3(b)(1), unless the authorization is terminated or revoked sooner.  Performed at [facility], [address], [place], [place] [address]          Radiology Studies:  CARDIAC CATHETERIZATION  Result Date: 12/14/2022   CULPRIT LESION: Prox LAD to Mid LAD lesion is 100% stenosed with 0% stenosed side branch in 1st Diag.   A drug-eluting stent was successfully placed using a STENT ONYX FRONTIER 2.5X30. => Taper postdilation from 3.1 to 2.8 mm   Post intervention, there is a 0% residual stenosis.  TIMI-3 flow restored.   Post intervention, the side branch was reduced to 0% residual stenosis distally (very small vessel); 1st Diag lesion is 100% stenosed distally.   Hemodynamic findings consistent with mild pulmonary hypertension.   There is no aortic valve stenosis.   Anticipated discharge date to be determined.   Patient still has need for additional titration of CHF/C medications along with additional diuresis   Recommend uninterrupted dual antiplatelet therapy with Aspirin 81mg  daily and Ticagrelor 90mg  twice daily for a minimum of 12 months (ACS-Class I recommendation).   Would continue Thienopyridine monotherapy with either clopidogrel 75 mg daily or ticagrelor 60 mg twice daily long-term based on 30 mm stent in the proximal LAD. POST-OPERATIVE DIAGNOSIS:  Severe single-vessel disease with 100% occlusion of the prox-mid LAD at SP1 otherwise no significant disease in a somewhat codominant system with the wraparound LAD providing the distal half to one third of the PDA territory. Difficult but successful DES PCI of the LAD with a Resolute Onyx DES 2.5 mm x 30 mm stent postdilated and tapered fashion from 3.1 to 2.8 mm.  2 small diagonal branches noted after stent with 1 small diagonal that barely feels that arose from the original occluded section. Consistent combined CHF with known EF of 30 to 35% and PCWP of 29 millimercury and LVEDP of 21 mL of  mercury, but preserved cardiac output put and index with cardiac output of 5.44 and index of 3.16. RHC numbers: RAP 5 mmHg; RV P-EDP 46/0-8 mmHg; PAP-mean 46/19-32 mmHg (mild to moderate WHO class II-LV CHF related), PCWP 28 mmHg.  LV P-EDP 113/5-21 mmHg; AO P-MAP 104/68-81 mmHg.  Ao sat 90%, PA sat 68%. PLAN OF CARE:  Patient continues to be inpatient.  I have written for IV Lasix 60 mg to be given today post cath (early PM dose).  Will continue with IV diuresis given elevated LVEDP and PCWP - INCREASE TO 60mg  BID starting tonite.  DAPT for minimum 1 year but would like to continue Thienopyridine for lifetime based on length of stent in the LAD.  Continue to titrate GDMT for CAD and CM/CHF.  Phase 2 [facility] ordered. Bryan Lemma, MD       Scheduled Meds:  aspirin EC  81 mg Oral Daily   carvedilol  3.125 mg Oral BID WC   dapagliflozin propanediol  10 mg Oral Daily   enoxaparin (LOVENOX) injection  40 mg Subcutaneous Q24H   fluticasone  2 spray Each Nare Daily   [START ON 12/16/2022] furosemide  40 mg Oral Daily   insulin aspart  0-15 Units Subcutaneous TID WC   insulin glargine-yfgn  5 Units Subcutaneous QHS   levothyroxine  75 mcg Oral Q0600   sodium chloride flush  3 mL Intravenous Q12H   spironolactone  12.5 mg Oral Daily   ticagrelor  90 mg Oral BID   Continuous Infusions:  sodium chloride       LOS: 3 days     Silvano Bilis, MD [facility]   If 7PM-7AM, please contact night-coverage [email or web address] Password [facility] 12/15/2022, 1:29 PM

## 2022-12-15 NOTE — Evaluation (Addendum)
Physical Therapy Evaluation and Discharge Patient Details Name: Whitney Knight MRN: [id number] DOB: [birth year] Today's Date: 12/15/2022  History of Present Illness  Patient is a 72 year old female with shortness of breath, elevated troponin, acute heart failure. S/p heart catheterization on 8/15  Clinical Impression  Patient is agreeable to PT evaluation. She is independent at baseline and active. She lives with her spouse and has supportive family that can assist if needed at discharge.  The patient walked 3 laps in the hallway without difficulty, no assistive device required. Cues for pacing for endurance, taking rest breaks as needed between bouts of activity. No apparent acute PT needs at this time. PT will sign off.       If plan is discharge home, recommend the following:     Can travel by private vehicle        Equipment Recommendations None recommended by PT  Recommendations for Other Services       Functional Status Assessment Patient has not had a recent decline in their functional status     Precautions / Restrictions Precautions Precautions: None Restrictions Other Position/Activity Restrictions: patient maintaing NWB of right wrist without difficulty following recent heart cath      Mobility  Bed Mobility               General bed mobility comments: not observed as patient sitting up on arrival and post session    Transfers Overall transfer level: Independent                      Ambulation/Gait Ambulation/Gait assistance: Modified independent (Device/Increase time) Gait Distance (Feet): 350 Feet Assistive device: None Gait Pattern/deviations: WFL(Within Functional Limits)       General Gait Details: initial cues for pacing for endurance and to decrease cadence for safety. no loss of balance. no significant shortness of breath is noted with activity.  Stairs            Wheelchair Mobility     Tilt Bed    Modified Rankin  (Stroke Patients Only)       Balance Overall balance assessment: Modified Independent                                           Pertinent Vitals/Pain Pain Assessment Pain Assessment: No/denies pain    Home Living Family/patient expects to be discharged to:: Private residence Living Arrangements: Spouse/significant other;Children Available Help at Discharge: Family;Available 24 hours/day Type of Home: House         Home Layout: Two level        Prior Function Prior Level of Function : Independent/Modified Independent;Driving                     Extremity/Trunk Assessment   Upper Extremity Assessment Upper Extremity Assessment: Overall WFL for tasks assessed    Lower Extremity Assessment Lower Extremity Assessment: Overall WFL for tasks assessed       Communication   Communication Communication: No apparent difficulties  Cognition Arousal: Alert Behavior During Therapy: WFL for tasks assessed/performed Overall Cognitive Status: Within Functional Limits for tasks assessed                                 General Comments: patient is able to follow all commands without  difficulty        General Comments      Exercises     Assessment/Plan    PT Assessment Patient does not need any further PT services  PT Problem List         PT Treatment Interventions      PT Goals (Current goals can be found in the Care Plan section)  Acute Rehab PT Goals PT Goal Formulation: All assessment and education complete, DC therapy    Frequency       Co-evaluation               AM-PAC PT "6 Clicks" Mobility  Outcome Measure Help needed turning from your back to your side while in a flat bed without using bedrails?: None Help needed moving from lying on your back to sitting on the side of a flat bed without using bedrails?: None Help needed moving to and from a bed to a chair (including a wheelchair)?: None Help needed  standing up from a chair using your arms (e.g., wheelchair or bedside chair)?: None Help needed to walk in hospital room?: None Help needed climbing 3-5 steps with a railing? : None 6 Click Score: 24    End of Session   Activity Tolerance: Patient tolerated treatment well Patient left: with call bell/phone within reach;with family/visitor present (seated on edge of bed)        Time: 9528-4132 PT Time Calculation (min) (ACUTE ONLY): 17 min   Charges:   PT Evaluation $PT Eval Low Complexity: 1 Low PT Treatments $Therapeutic Activity: 8-22 mins PT General Charges $$ ACUTE PT VISIT: 1 Visit        Donna Bernard, PT, MPT   Ina Homes 12/15/2022, 12:29 PM

## 2022-12-15 NOTE — Care Management Important Message (Signed)
Important Message  Patient Details  Name: Whitney Knight MRN: [id number] Date of Birth: 1950/05/25   Medicare Important Message Given:  Yes     [name] 12/15/2022, 9:56 AM

## 2022-12-15 NOTE — Progress Notes (Addendum)
Pt EKG resulted with critical long QTC. NP Jon Billings made aware. Will continue to monitor.  Update 0454: See new orders. Will continue to monitor.  Update 0604.: Lab called for a critical potassium of 2.7. NP Jon Billings made aware Will continue to monitor.

## 2022-12-16 DIAGNOSIS — E7800 Pure hypercholesterolemia, unspecified: Secondary | ICD-10-CM | POA: Diagnosis not present

## 2022-12-16 DIAGNOSIS — I2109 ST elevation (STEMI) myocardial infarction involving other coronary artery of anterior wall: Secondary | ICD-10-CM

## 2022-12-16 DIAGNOSIS — I255 Ischemic cardiomyopathy: Secondary | ICD-10-CM

## 2022-12-16 DIAGNOSIS — I5021 Acute systolic (congestive) heart failure: Secondary | ICD-10-CM | POA: Diagnosis not present

## 2022-12-16 LAB — BASIC METABOLIC PANEL
Anion gap: 9 (ref 5–15)
BUN: 27 mg/dL — ABNORMAL HIGH (ref 8–23)
CO2: 24 mmol/L (ref 22–32)
Calcium: 8.9 mg/dL (ref 8.9–10.3)
Chloride: 105 mmol/L (ref 98–111)
Creatinine, Ser: 0.92 mg/dL (ref 0.44–1.00)
GFR, Estimated: >60 mL/min (ref >60)
Glucose, Bld: 110 mg/dL — ABNORMAL HIGH (ref 70–99)
Potassium: 4.5 mmol/L (ref 3.5–5.1)
Sodium: 138 mmol/L (ref 135–145)

## 2022-12-16 LAB — GLUCOSE, CAPILLARY
Glucose-Capillary: 127 mg/dL — ABNORMAL HIGH (ref 70–99)
Glucose-Capillary: 91 mg/dL (ref 70–99)

## 2022-12-16 MED ORDER — SPIRONOLACTONE 25 MG PO TABS: 12.5000 mg | ORAL_TABLET | Freq: Every day | ORAL | 1 refills | Status: DC

## 2022-12-16 MED ORDER — FUROSEMIDE 40 MG PO TABS: 40.0000 mg | ORAL_TABLET | Freq: Every day | ORAL | 1 refills | Status: DC

## 2022-12-16 MED ORDER — ASPIRIN 81 MG PO TBEC: 81.0000 mg | DELAYED_RELEASE_TABLET | Freq: Every day | ORAL | Status: AC

## 2022-12-16 MED ORDER — TICAGRELOR 90 MG PO TABS: 90.0000 mg | ORAL_TABLET | Freq: Two times a day (BID) | ORAL | 1 refills | Status: DC

## 2022-12-16 MED ORDER — DAPAGLIFLOZIN PROPANEDIOL 10 MG PO TABS: 10.0000 mg | ORAL_TABLET | Freq: Every day | ORAL | 1 refills | Status: DC

## 2022-12-16 MED ORDER — CARVEDILOL 3.125 MG PO TABS: 3.1250 mg | ORAL_TABLET | Freq: Two times a day (BID) | ORAL | 1 refills | Status: DC

## 2022-12-16 NOTE — TOC Transition Note (Signed)
Transition of Care [facility]) - CM/SW Discharge Note   Patient Details  Name: Whitney Knight MRN: [id number] Date of Birth: 14-Sep-1950  Transition of Care [facility]) CM/SW Contact:  Bing Quarry, RN Phone Number: 12/16/2022, 1:00 PM   Clinical Narrative:  8/17: DC order/summary in pending delivery and placement of [employer] Life Vest ordered 12/15/22 (See copied text from prior CM note for order). DC home/self care. Checked with Unit RN on delivery of Life Vest prior to discharge. Unit RN confirmed delivery and that Life Vest on patient.   Gabriel Cirri MSN RN CM  Transitions of Care Department [facility] [phone] Weekends Only   [place] order note: "Per Prior CM prgress note RE [employer] order. "Irving Burton with [employer] reports referral given by cards for [employer] lifevest for patient, requested clinicals faxed to [phone] and emailed to [email or web address]".    Final next level of care: Home/Self Care Barriers to Discharge:  (Pending delivery and placement of [employer] Life Vest ordered 12/15/22)   Patient Goals and CMS Choice      Discharge Placement                         Discharge Plan and Services Additional resources added to the After Visit Summary for                  DME Arranged: Life vest   Date DME Agency Contacted: 12/15/22     HH Arranged: NA HH Agency: NA        Social Determinants of Health (SDOH) Interventions SDOH Screenings   Food Insecurity: No Food Insecurity (12/13/2022)  Housing: Low Risk  (12/13/2022)  Transportation Needs: No Transportation Needs (12/13/2022)  Utilities: Not At Risk (12/13/2022)  Alcohol Screen: Low Risk  (10/10/2022)  Depression (PHQ2-9): Low Risk  (10/10/2022)  Financial Resource Strain: Patient Declined (12/12/2022)  Physical Activity: Sufficiently Active (12/12/2022)  Social Connections: Unknown (12/12/2022)  Stress: No Stress Concern Present (12/12/2022)  Tobacco Use: Low Risk  (12/13/2022)     Readmission Risk Interventions      No data to display

## 2022-12-16 NOTE — Plan of Care (Signed)

## 2022-12-16 NOTE — Progress Notes (Signed)
Progress Note  Patient Name: Whitney Knight Date of Encounter: 12/16/2022  Primary Cardiologist: New - consult by Dr. Okey Dupre, MD  Subjective   No chest pain or dyspnea.  Documented urine output 2 L for the admission.  Potassium repleted.  BUN starting to uptrend.  Hemodynamically stable. Slept well last evening. Awaiting LifeVest fitting.   Inpatient Medications    Scheduled Meds:  aspirin EC  81 mg Oral Daily   carvedilol  3.125 mg Oral BID WC   dapagliflozin propanediol  10 mg Oral Daily   enoxaparin (LOVENOX) injection  40 mg Subcutaneous Q24H   fluticasone  2 spray Each Nare Daily   furosemide  40 mg Oral Daily   insulin aspart  0-15 Units Subcutaneous TID WC   levothyroxine  75 mcg Oral Q0600   sodium chloride flush  3 mL Intravenous Q12H   spironolactone  12.5 mg Oral Daily   ticagrelor  90 mg Oral BID   Continuous Infusions:  sodium chloride     PRN Meds: sodium chloride, acetaminophen, albuterol, ondansetron (ZOFRAN) IV, sodium chloride flush   Vital Signs    Vitals:   12/15/22 2048 12/15/22 2310 12/16/22 0500 12/16/22 0555  BP: 104/65 108/64  118/67  Pulse: 85 85  87  Resp: 18 18    Temp: 98 F (36.7 C) 97.8 F (36.6 C)  98.1 F (36.7 C)  TempSrc: Oral Oral  Oral  SpO2: 98% 97%  95%  Weight:   61.2 kg   Height:        Intake/Output Summary (Last 24 hours) at 12/16/2022 0731 Last data filed at 12/15/2022 2245 Gross per 24 hour  Intake 1200 ml  Output 1850 ml  Net -650 ml   Filed Weights   12/13/22 0532 12/15/22 0500 12/16/22 0500  Weight: 67.2 kg 61.9 kg 61.2 kg    Telemetry    SR, artifact, 70s-80s bpm - Personally Reviewed  ECG    NSR, 82 bpm, nonspecific IVCD, prior septal infarct - Personally Reviewed  Physical Exam   GEN: No acute distress.   Neck: No JVD. Cardiac: RRR, II/VI systolic murmur LUSB, no rubs, or gallops. Right radial arteriotomy site is without active bleeding or swelling. Some bruising noted. No TTP or erythema.  Radial pulse 2+ proximal and distal to the arteriotomy site.  Respiratory: Diminished breath sounds along the bases bilaterally.  GI: Soft, nontender, non-distended.   MS: No edema; No deformity. Neuro:  Alert and oriented x 3; Nonfocal.  Psych: Normal affect.  Labs    Chemistry Recent Labs  Lab 12/14/22 0629 12/14/22 1148 12/14/22 1347 12/15/22 0504 12/15/22 1449 12/16/22 0448  NA 133*   < > 134* 137  --  138  K 2.8*   < > 2.9* 2.7* 4.4 4.5  CL 96*  --   --  95*  --  105  CO2 29  --   --  28  --  24  GLUCOSE 115*  --   --  108*  --  110*  BUN 15  --   --  18  --  27*  CREATININE 0.88  --   --  0.97  --  0.92  CALCIUM 7.8*  --   --  8.2*  --  8.9  GFRNONAA >60  --   --  >60  --  >60  ANIONGAP 9  --   --  14  --  9   < > = values in this interval not  displayed.     Hematology Recent Labs  Lab 12/13/22 0208 12/14/22 0629 12/14/22 1337 12/14/22 1347 12/15/22 0504  WBC 11.3* 8.0  --   --  8.9  RBC 4.24 4.37  --   --  4.92  HGB 11.9* 12.2 12.9 12.9 13.8  HCT 35.7* 36.8 38.0 38.0 42.5  MCV 84.2 84.2  --   --  86.4  MCH 28.1 27.9  --   --  28.0  MCHC 33.3 33.2  --   --  32.5  RDW 13.9 14.2  --   --  14.4  PLT 319 326  --   --  299    Cardiac EnzymesNo results for input(s): "TROPONINI" in the last 168 hours. No results for input(s): "TROPIPOC" in the last 168 hours.   BNP Recent Labs  Lab 12/12/22 1229  PROBNP 1,663.0*     DDimer No results for input(s): "DDIMER" in the last 168 hours.   Radiology    DG Abd 1 View  Result Date: 12/13/2022 IMPRESSION: 1. Nonobstructive bowel gas pattern. 2. No supine evidence for pneumoperitoneum. Electronically Signed   By: Signa Kell M.D.   On: 12/13/2022 14:15   CT Angio Chest PE W/Cm &/Or Wo Cm  Result Date: 12/12/2022 IMPRESSION: 1. No evidence for acute pulmonary embolus. 2. Small to moderate bilateral pleural effusions. Diffuse interstitial edema is identified. Correlate for signs/symptoms of congestive heart  failure. 3. Atelectasis and airspace consolidation noted in the right middle lobe and right lower lobe. 4. Several foci of gas are identified overlying the anterolateral margin of the right hepatic lobe for which pneumoperitoneum cannot be excluded. 5. Coronary artery calcification. 6.  Aortic Atherosclerosis (ICD10-I70.0). Critical Value/emergent results were called by telephone at the time of interpretation on 12/12/2022 at 6:53 pm to provider [facility] , who verbally acknowledged these results. Electronically Signed   By: Signa Kell M.D.   On: 12/12/2022 18:53   DG Chest 2 View  Addendum Date: 12/12/2022   ADDENDUM REPORT: 12/12/2022 16:09 ADDENDUM: Corrected report: Original report was generated with an error due to voice recognition software, corrected as follows: Comparison dated chest x-ray dated May 15, 2016, rib series dated May 15, 2017 Electronically Signed   By: Allegra Lai M.D.   On: 12/12/2022 16:09   Result Date: 12/12/2022 IMPRESSION: 1. New asymmetric fullness of the right hilum. Recommend contrast-enhanced chest CT to exclude right hilar mass. 2. Small right-greater-than-left pleural effusions. Electronically Signed: By: Allegra Lai M.D. On: 12/12/2022 15:22    Cardiac Studies   Calcium score 02/24/2021: IMPRESSION AND RECOMMENDATION: 1. Coronary calcium score of 37.1. This was 64th percentile for age and sex matched control. 2. CAC 1-99 in LAD.  CAC-DRS A1/N1. 3. Continue heart healthy lifestyle and risk factor modification. 4. Consider statin therapy due to Age >55. __________  2D echo 12/13/2022: 1. Left ventricular ejection fraction, by estimation, is 30 to 35%. The  left ventricle has moderately decreased function. The left ventricle  demonstrates regional wall motion abnormalities (see scoring  diagram/findings for description). Left ventricular   diastolic parameters are consistent with Grade II diastolic dysfunction  (pseudonormalization).  Elevated left atrial pressure. The average left  ventricular global longitudinal strain is -6.7 %. The global longitudinal  strain is abnormal.   2. Right ventricular systolic function is normal. The right ventricular  size is normal. There is normal pulmonary artery systolic pressure.   3. Left atrial size was mildly dilated.   4. Moderate pleural effusion  in both left and right lateral regions.   5. The mitral valve is abnormal. Mild to moderate mitral valve  regurgitation. No evidence of mitral stenosis.   6. Tricuspid valve regurgitation is moderate.   7. The aortic valve is tricuspid. Aortic valve regurgitation is not  visualized. No aortic stenosis is present.   8. The inferior vena cava is normal in size with greater than 50%  respiratory variability, suggesting right atrial pressure of 3 mmHg.   Conclusion(s)/Recommendation(s): No left ventricular mural or apical thrombus/thrombi.  __________  [facility] 12/14/2022:   CULPRIT LESION: Prox LAD to Mid LAD lesion is 100% stenosed with 0% stenosed side branch in 1st Diag.   A drug-eluting stent was successfully placed using a STENT ONYX FRONTIER 2.5X30. => Taper postdilation from 3.1 to 2.8 mm   Post intervention, there is a 0% residual stenosis.  TIMI-3 flow restored.   Post intervention, the side branch was reduced to 0% residual stenosis distally (very small vessel); 1st Diag lesion is 100% stenosed distally.   Hemodynamic findings consistent with mild pulmonary hypertension.   There is no aortic valve stenosis.   Anticipated discharge date to be determined.   Patient still has need for additional titration of CHF/C medications along with additional diuresis   Recommend uninterrupted dual antiplatelet therapy with Aspirin 81mg  daily and Ticagrelor 90mg  twice daily for a minimum of 12 months (ACS-Class I recommendation).   Would continue Thienopyridine monotherapy with either clopidogrel 75 mg daily or ticagrelor 60 mg twice daily  long-term based on 30 mm stent in the proximal LAD.   POST-OPERATIVE DIAGNOSIS:   Severe single-vessel disease with 100% occlusion of the prox-mid LAD at SP1 otherwise no significant disease in a somewhat codominant system with the wraparound LAD providing the distal half to one third of the PDA territory. Difficult but successful DES PCI of the LAD with a Resolute Onyx DES 2.5 mm x 30 mm stent postdilated and tapered fashion from 3.1 to 2.8 mm.  2 small diagonal branches noted after stent with 1 small diagonal that barely feels that arose from the original occluded section. Consistent combined CHF with known EF of 30 to 35% and PCWP of 29 millimercury and LVEDP of 21 mL of mercury, but preserved cardiac output put and index with cardiac output of 5.44 and index of 3.16. RHC numbers: RAP 5 mmHg; RV P-EDP 46/0-8 mmHg; PAP-mean 46/19-32 mmHg (mild to moderate WHO class II-LV CHF related), PCWP 28 mmHg.  LV P-EDP 113/5-21 mmHg; AO P-MAP 104/68-81 mmHg.  Ao sat 90%, PA sat 68%.     PLAN OF CARE:  Patient continues to be inpatient.  I have written for IV Lasix 60 mg to be given today post cath (early PM dose).  Will continue with IV diuresis given elevated LVEDP and PCWP - INCREASE TO 60mg  BID starting tonite.  DAPT for minimum 1 year but would like to continue Thienopyridine for lifetime based on length of stent in the LAD.  Continue to titrate GDMT for CAD and CM/CHF.  Phase 2 CRH ordered.   Patient Profile     72 y.o. female with history of DM2, HTN, HLD, hypothyroidism, and osteoporosis who was admitted on 12/12/2022 with progressive heart failure symptoms and we are seeing for likely viral myocarditis  Assessment & Plan    1. Acute HFrEF with late presenting anterior STEMI: -Currently, without chest pain or dyspnea -Status post challenging PCI to the LAD on 12/14/2022 with recommendation for DAPT with ASA 81  mg daily and Brilinta 90 mg bid without interruption for a minimum of 1 year from PCI and  likely continued thienopyridine for lifetime based on length of stent in the LAD  -Farxiga, noted nausea/vomiting with Jardiance -Declines losartan due to cough -Defer ARB/ACEi/ARNI due to cough -Continue Farxiga, carvedilol, and spironolactone -Escalate GDMT in follow up as able -Now on oral furosemide 40 mg daily -LifeVest fitting pending -Strict I/O -Daily weights  -Cardiac rehab -Post cath instructions  -CHF education  2. HTN: -Blood pressure stable -Medical therapy as outlined above  3. HLD: -LDL 87 this admission -Declines statin  -Consider PCSK9i as outpatient  4. Hyponatremia: -Improved -Most likely due to her heart failure though SIADH is a consideration as well with recent respiratory illness   5. Hypokalemia: -Repleted    Once she has been fit for LifeVest, she may be discharged from a cardiac perspective on current medications. I will arrange follow up in our office.       For questions or updates, please contact [facility] Please consult [email or web address] for contact info under Cardiology/STEMI.    Signed, Eula Listen, PA-C [facility] Pager: [phone] 12/16/2022, 7:31 AM

## 2022-12-16 NOTE — Discharge Summary (Addendum)
Whitney Knight VHQ:[id number] DOB: 1950-10-02 DOA: 12/12/2022  PCP: Glori Luis, MD  Admit date: 12/12/2022 Discharge date: 12/16/2022  Time spent: 35  minutes  Recommendations for Outpatient Follow-up:  Pcp and cardiology f/u  BMP at f/u    Discharge Diagnoses:  Principal Problem:   Acute systolic heart failure (HCC) Active Problems:   Hyponatremia   Gas foci overlying anterolateral margin of the right hepatic lobe   Hypothyroidism   Hypertension   Uncontrolled type 2 diabetes mellitus with hyperglycemia (HCC)   Iron deficiency anemia   Acute pulmonary edema (HCC)   Cardiomegaly   Dilated cardiomyopathy (HCC)   Elevated troponin   Coronary artery disease   Ischemic cardiomyopathy   Discharge Condition: stable  Diet recommendation: heart healthy  Filed Weights   12/13/22 0532 12/15/22 0500 12/16/22 0500  Weight: 67.2 kg 61.9 kg 61.2 kg    History of present illness:  From admission h and p Whitney Knight is a 72 y.o. female with medical history significant of hypertension, type 2 diabetes, hyperlipidemia, osteoporosis, hypothyroidism, who presents to the ED due to shortness of breath.   Whitney Knight states she began to feel ill around August 3rd, with generalized malaise. She subsequently developed a sore throat and due to this, went to an urgent care.  She was told that she was negative for group A strep.  Then over the last 1 to 2 days, she has developed rapidly onset abdominal distention, lower extremity swelling, orthopnea, and shortness of breath.  She endorses diarrhea yesterday, but states it was after taking a laxative. No abdominal pain at this time. No prior history of heart failure.  She notes that she lost approximately 40 pounds intentionally and it seems that over the last 1 to 2 days, all the weight suddenly came back on.   Whitney notes that she has been on a clean diet regimen. She drinks 3-4 packs of electrolytes supplements per day. On review  of packets, total sodium = 855 mg per drink. We discussed this is very high salt intake.     Hospital Course:  # Acute systolic CHF exacerbation # Ischemic cardiomyopathy # NSTEMI Several days swelling, bnp markedly elevated, pulm edema on CTA. No PE. Covid and RVP are negative. EF 30-35% on TTE. Cath 8/15 showing 100% occlusion of the prox-mid LAD with DES placed - d/c home with GDMT, outpt cardiology f/u, cardiac rehab, pcp f/u - LifeVest placed prior to d/c   # Hyponatremia Likely hypervolemic from chf. Normalized w/ diuresis   # Gas foci overlying anterolateral margin of the right hepatic lobe Per admitting provider, "EDP discussed with radiology team, who is recommending repeat abdominal x-ray tomorrow unless Whitney becomes symptomatic, then will need CT of the abdomen." Continues to deny abdominal pain, no abdominal tenderness. KUB with no free air   # Hypothyroid Tsh wnl - cont home synthroid   # Hypokalemia Resolved with repletion, holding on supplement given start of spiro   # T2DM Euglycemic   # HTN Here normotensive in setting of diuresis and institution of GDMT  Procedures: Cardiac cath   Consultations: cardiology  Discharge Exam: Vitals:   12/16/22 0555 12/16/22 0842  BP: 118/67 106/73  Pulse: 87 80  Resp:  16  Temp: 98.1 F (36.7 C) 98.1 F (36.7 C)  SpO2: 95% 100%    General: NAD Cardiovascular: RRR Respiratory: CTAB  Discharge Instructions   Discharge Instructions     AMB Referral to Cardiac Rehabilitation - Phase  II   Complete by: As directed    Diagnosis:  NSTEMI Heart Failure (see criteria below if ordering Phase II) Coronary Stents     Heart Failure Type: Chronic Systolic & Diastolic   After initial evaluation and assessments completed: Virtual Based Care may be provided alone or in conjunction with Phase 2 Cardiac Rehab based on Whitney barriers.: Yes   Intensive Cardiac Rehabilitation (ICR) [facility] location only OR Traditional Cardiac  Rehabilitation (TCR) *If criteria for ICR are not met will enroll in TCR [facility] only): Yes   Diet - low sodium heart healthy   Complete by: As directed    Increase activity slowly   Complete by: As directed       Allergies as of 12/16/2022       Reactions   Clarithromycin    REACTION: Nausea   Jardiance [empagliflozin] Nausea And Vomiting   Penicillins    REACTION: Throat swelling   Prednisone Swelling   Whitney states it "closed her esophagus and she had to get it stretched."        Medication List     STOP taking these medications    amLODipine 5 MG tablet Commonly known as: NORVASC   Betamethasone Valerate 0.12 % foam   cholecalciferol 1000 units tablet Commonly known as: VITAMIN D   doxycycline 100 MG tablet Commonly known as: VIBRA-TABS   Fish Oil 500 MG Caps   glimepiride 2 MG tablet Commonly known as: AMARYL   hydrochlorothiazide 12.5 MG capsule Commonly known as: MICROZIDE   Magnesium 250 MG Tabs   Potassium 99 MG Tabs   WOMENS MULTIVITAMIN PO       TAKE these medications    albuterol 108 (90 Base) MCG/ACT inhaler Commonly known as: VENTOLIN HFA Inhale 2 puffs into the lungs every 6 (six) hours as needed for wheezing or shortness of breath.   aspirin EC 81 MG tablet Take 1 tablet (81 mg total) by mouth daily. Swallow whole. Start taking on: December 17, 2022   blood glucose meter kit and supplies Kit One touch ultra mini. Use 2-4 times per day.   carvedilol 3.125 MG tablet Commonly known as: COREG Take 1 tablet (3.125 mg total) by mouth 2 (two) times daily with a meal.   dapagliflozin propanediol 10 MG Tabs tablet Commonly known as: FARXIGA Take 1 tablet (10 mg total) by mouth daily. Start taking on: December 17, 2022   [facility] G7 Receiver Hardie Pulley Use to check glucose at least every 8 hours.   Dexcom G7 Sensor Misc Apply every 10 days   fluticasone 50 MCG/ACT nasal spray Commonly known as: FLONASE USE TWO SPRAYS IN EACH NOSTRIL  DAILY   furosemide 40 MG tablet Commonly known as: LASIX Take 1 tablet (40 mg total) by mouth daily. Start taking on: December 17, 2022   levothyroxine 75 MCG tablet Commonly known as: SYNTHROID TAKE 1 TABLET BY MOUTH DAILY   Mounjaro 15 MG/0.5ML Pen Generic drug: tirzepatide Inject 15 mg into the skin once a week.   OneTouch Delica Plus Lancet33G Misc USE ONCE DAILY AS DIRECTED   OneTouch Ultra test strip Generic drug: glucose blood USE TO CHECK BLOOD SUGAR UP TO THREE TIMES DAILY   RECLAST IV Inject into the vein. Once a year   saccharomyces boulardii 250 MG capsule Commonly known as: Florastor Take 1 capsule (250 mg total) by mouth daily.   spironolactone 25 MG tablet Commonly known as: ALDACTONE Take 0.5 tablets (12.5 mg total) by mouth daily. Start taking  on: December 17, 2022   ticagrelor 90 MG Tabs tablet Commonly known as: BRILINTA Take 1 tablet (90 mg total) by mouth 2 (two) times daily.               Durable Medical Equipment  (From admission, onward)           Start     Ordered   12/15/22 1321  For home use only DME Vest life vest  Once       Comments: VT 150 bpm VF 200 bpm 150 J x 5 Length of need: 3 months Start date: 12/15/2022   12/15/22 1321           Allergies  Allergen Reactions   Clarithromycin     REACTION: Nausea   Jardiance [Empagliflozin] Nausea And Vomiting   Penicillins     REACTION: Throat swelling   Prednisone Swelling    Whitney states it "closed her esophagus and she had to get it stretched."    Follow-up Information     Iran Ouch, MD Follow up.   Specialty: Cardiology Contact information: [address] [place] [address] [phone]         Glori Luis, MD Follow up.   Specialty: Family Medicine Contact information: [address] [place] [address] [phone]                  The results of significant diagnostics from this  hospitalization (including imaging, microbiology, ancillary and laboratory) are listed below for reference.    Significant Diagnostic Studies: CARDIAC CATHETERIZATION  Result Date: 12/14/2022   CULPRIT LESION: Prox LAD to Mid LAD lesion is 100% stenosed with 0% stenosed side branch in 1st Diag.   A drug-eluting stent was successfully placed using a STENT ONYX FRONTIER 2.5X30. => Taper postdilation from 3.1 to 2.8 mm   Post intervention, there is a 0% residual stenosis.  TIMI-3 flow restored.   Post intervention, the side branch was reduced to 0% residual stenosis distally (very small vessel); 1st Diag lesion is 100% stenosed distally.   Hemodynamic findings consistent with mild pulmonary hypertension.   There is no aortic valve stenosis.   Anticipated discharge date to be determined.   Whitney still has need for additional titration of CHF/C medications along with additional diuresis   Recommend uninterrupted dual antiplatelet therapy with Aspirin 81mg  daily and Ticagrelor 90mg  twice daily for a minimum of 12 months (ACS-Class I recommendation).   Would continue Thienopyridine monotherapy with either clopidogrel 75 mg daily or ticagrelor 60 mg twice daily long-term based on 30 mm stent in the proximal LAD. POST-OPERATIVE DIAGNOSIS:  Severe single-vessel disease with 100% occlusion of the prox-mid LAD at SP1 otherwise no significant disease in a somewhat codominant system with the wraparound LAD providing the distal half to one third of the PDA territory. Difficult but successful DES PCI of the LAD with a Resolute Onyx DES 2.5 mm x 30 mm stent postdilated and tapered fashion from 3.1 to 2.8 mm.  2 small diagonal branches noted after stent with 1 small diagonal that barely feels that arose from the original occluded section. Consistent combined CHF with known EF of 30 to 35% and PCWP of 29 millimercury and LVEDP of 21 mL of mercury, but preserved cardiac output put and index with cardiac output of 5.44 and index  of 3.16. RHC numbers: RAP 5 mmHg; RV P-EDP 46/0-8 mmHg; PAP-mean 46/19-32 mmHg (mild to moderate WHO class II-LV CHF  related), PCWP 28 mmHg.  LV P-EDP 113/5-21 mmHg; AO P-MAP 104/68-81 mmHg.  Ao sat 90%, PA sat 68%. PLAN OF CARE:  Whitney continues to be inpatient.  I have written for IV Lasix 60 mg to be given today post cath (early PM dose).  Will continue with IV diuresis given elevated LVEDP and PCWP - INCREASE TO 60mg  BID starting tonite.  DAPT for minimum 1 year but would like to continue Thienopyridine for lifetime based on length of stent in the LAD.  Continue to titrate GDMT for CAD and CM/CHF.  Phase 2 [facility] ordered. Bryan Lemma, MD  DG Abd 1 View  Result Date: 12/13/2022 CLINICAL DATA:  Evaluate for free air. EXAM: ABDOMEN - 1 VIEW COMPARISON:  CT from prior day. FINDINGS: The bowel gas pattern is normal. No supine evidence for pneumoperitoneum. No radio-opaque calculi or other significant radiographic abnormality are seen. IMPRESSION: 1. Nonobstructive bowel gas pattern. 2. No supine evidence for pneumoperitoneum. Electronically Signed   By: Signa Kell M.D.   On: 12/13/2022 14:15   ECHOCARDIOGRAM COMPLETE  Result Date: 12/13/2022    ECHOCARDIOGRAM REPORT   Whitney Name:   JILLYN Knight Date of Exam: 12/13/2022 Medical Rec #:  [id number]        Height:       64.0 in Accession #:    [id number]       Weight:       148.1 lb Date of Birth:  04-06-1951        BSA:          1.722 m Whitney Age:    72 years         BP:           118/71 mmHg Whitney Gender: F                HR:           69 bpm. Exam Location:  [facility] Procedure: 2D Echo, 3D Echo, Cardiac Doppler, Color Doppler, Strain Analysis and            Intracardiac Opacification Agent Indications:     CHF  History:         Whitney has no prior history of Echocardiogram examinations.                  CHF, Signs/Symptoms:Shortness of Breath; Risk                  Factors:Hypertension, Diabetes and Dyslipidemia.  Sonographer:     Mikki Harbor  Referring Phys:  [phone] Antonieta Iba Diagnosing Phys: Yvonne Kendall MD  Sonographer Comments: Global longitudinal strain was attempted. IMPRESSIONS  1. Left ventricular ejection fraction, by estimation, is 30 to 35%. The left ventricle has moderately decreased function. The left ventricle demonstrates regional wall motion abnormalities (see scoring diagram/findings for description). Left ventricular  diastolic parameters are consistent with Grade II diastolic dysfunction (pseudonormalization). Elevated left atrial pressure. The average left ventricular global longitudinal strain is -6.7 %. The global longitudinal strain is abnormal.  2. Right ventricular systolic function is normal. The right ventricular size is normal. There is normal pulmonary artery systolic pressure.  3. Left atrial size was mildly dilated.  4. Moderate pleural effusion in both left and right lateral regions.  5. The mitral valve is abnormal. Mild to moderate mitral valve regurgitation. No evidence of mitral stenosis.  6. Tricuspid valve regurgitation is moderate.  7. The aortic valve is tricuspid. Aortic valve regurgitation is not  visualized. No aortic stenosis is present.  8. The inferior vena cava is normal in size with greater than 50% respiratory variability, suggesting right atrial pressure of 3 mmHg. Conclusion(s)/Recommendation(s): No left ventricular mural or apical thrombus/thrombi. FINDINGS  Left Ventricle: Left ventricular ejection fraction, by estimation, is 30 to 35%. The left ventricle has moderately decreased function. The left ventricle demonstrates regional wall motion abnormalities. Definity contrast agent was given IV to delineate the left ventricular endocardial borders. The average left ventricular global longitudinal strain is -6.7 %. The global longitudinal strain is abnormal. The left ventricular internal cavity size was normal in size. There is no left ventricular hypertrophy. Left ventricular diastolic parameters  are consistent with Grade II diastolic dysfunction (pseudonormalization). Elevated left atrial pressure.  LV Wall Scoring: The mid and distal anterior septum, apical lateral segment, apical anterior segment, and apex are akinetic. The mid and distal inferior wall, mid inferolateral segment, mid anterolateral segment, mid inferoseptal segment, and mid anterior segment are hypokinetic. The basal anteroseptal segment, basal inferolateral segment, basal anterolateral segment, basal anterior segment, basal inferior segment, and basal inferoseptal segment are normal. Right Ventricle: The right ventricular size is normal. No increase in right ventricular wall thickness. Right ventricular systolic function is normal. There is normal pulmonary artery systolic pressure. The tricuspid regurgitant velocity is 2.59 m/s, and  with an assumed right atrial pressure of 3 mmHg, the estimated right ventricular systolic pressure is 29.8 mmHg. Left Atrium: Left atrial size was mildly dilated. Right Atrium: Right atrial size was normal in size. Pericardium: There is no evidence of pericardial effusion. Mitral Valve: The mitral valve is abnormal. There is mild thickening of the mitral valve leaflet(s). Mild to moderate mitral valve regurgitation. No evidence of mitral valve stenosis. MV peak gradient, 8.0 mmHg. The mean mitral valve gradient is 3.0 mmHg. Tricuspid Valve: The tricuspid valve is normal in structure. Tricuspid valve regurgitation is moderate. Aortic Valve: The aortic valve is tricuspid. Aortic valve regurgitation is not visualized. No aortic stenosis is present. Aortic valve mean gradient measures 3.8 mmHg. Aortic valve peak gradient measures 7.6 mmHg. Aortic valve area, by VTI measures 1.91 cm. Pulmonic Valve: The pulmonic valve was normal in structure. Pulmonic valve regurgitation is trivial. No evidence of pulmonic stenosis. Aorta: The aortic root is normal in size and structure. Pulmonary Artery: The pulmonary artery is  of normal size. Venous: The inferior vena cava is normal in size with greater than 50% respiratory variability, suggesting right atrial pressure of 3 mmHg. IAS/Shunts: No atrial level shunt detected by color flow Doppler. Additional Comments: There is a moderate pleural effusion in both left and right lateral regions.  LEFT VENTRICLE PLAX 2D LVIDd:         4.50 cm     Diastology LVIDs:         3.30 cm     LV e' medial:    6.53 cm/s LV PW:         0.90 cm     LV E/e' medial:  17.2 LV IVS:        1.00 cm     LV e' lateral:   8.81 cm/s LVOT diam:     1.70 cm     LV E/e' lateral: 12.7 LV SV:         45 LV SV Index:   26          2D Longitudinal Strain LVOT Area:     2.27 cm    2D Strain GLS Avg:     -  6.7 %  LV Volumes (MOD) LV vol d, MOD A4C: 81.8 ml LV vol s, MOD A4C: 53.2 ml LV SV MOD A4C:     81.8 ml RIGHT VENTRICLE RV Basal diam:  3.20 cm RV Mid diam:    2.30 cm LEFT ATRIUM             Index        RIGHT ATRIUM           Index LA diam:        3.90 cm 2.26 cm/m   RA Area:     12.70 cm LA Vol (A2C):   59.1 ml 34.32 ml/m  RA Volume:   31.00 ml  18.00 ml/m LA Vol (A4C):   54.5 ml 31.65 ml/m LA Biplane Vol: 59.5 ml 34.55 ml/m  AORTIC VALVE                    PULMONIC VALVE AV Area (Vmax):    2.00 cm     PV Vmax:       0.98 m/s AV Area (Vmean):   1.80 cm     PV Peak grad:  3.8 mmHg AV Area (VTI):     1.91 cm AV Vmax:           138.19 cm/s AV Vmean:          87.630 cm/s AV VTI:            0.234 m AV Peak Grad:      7.6 mmHg AV Mean Grad:      3.8 mmHg LVOT Vmax:         122.00 cm/s LVOT Vmean:        69.600 cm/s LVOT VTI:          0.197 m LVOT/AV VTI ratio: 0.84  AORTA Ao Root diam: 2.70 cm MITRAL VALVE                TRICUSPID VALVE MV Area (PHT): 5.93 cm     TR Peak grad:   26.8 mmHg MV Area VTI:   1.64 cm     TR Vmax:        259.00 cm/s MV Peak grad:  8.0 mmHg MV Mean grad:  3.0 mmHg     SHUNTS MV Vmax:       1.41 m/s     Systemic VTI:  0.20 m MV Vmean:      80.2 cm/s    Systemic Diam: 1.70 cm MV Decel Time:  128 msec MV E velocity: 112.00 cm/s MV A velocity: 90.10 cm/s MV E/A ratio:  1.24 Whitney Knight End MD Electronically signed by Yvonne Kendall MD Signature Date/Time: 12/13/2022/11:02:30 AM    Final    CT Angio Chest PE W/Cm &/Or Wo Cm  Result Date: 12/12/2022 CLINICAL DATA:  Rule out pulmonary embolism.  Shortness of breath. EXAM: CT ANGIOGRAPHY CHEST WITH CONTRAST TECHNIQUE: Multidetector CT imaging of the chest was performed using the standard protocol during bolus administration of intravenous contrast. Multiplanar CT image reconstructions and MIPs were obtained to evaluate the vascular anatomy. RADIATION DOSE REDUCTION: This exam was performed according to the departmental dose-optimization program which includes automated exposure control, adjustment of the mA and/or kV according to Whitney size and/or use of iterative reconstruction technique. CONTRAST:  75mL OMNIPAQUE IOHEXOL 350 MG/ML SOLN COMPARISON:  None Available. FINDINGS: Cardiovascular: Satisfactory opacification of the pulmonary arteries to the segmental level. No evidence of pulmonary embolism. Normal heart size. No pericardial effusion.  Mild aortic atherosclerosis and coronary artery calcification. No pericardial effusion. Mediastinum/Nodes: Thyroid gland and trachea appear normal. Unremarkable appearance of the esophagus. No mediastinal or hilar adenopathy. Lungs/Pleura: Small to moderate bilateral pleural effusions. Atelectasis and airspace consolidation noted in the right middle lobe and right lower lobe. Signs of interstitial edema with interlobular septal thickening and diffuse hazy ground-glass attenuation is noted bilaterally. Areas of subsegmental atelectasis are identified within the lingula and posterior left lower lobe. Upper Abdomen: Several foci of gas are identified overlying the anterolateral margin of the right hepatic lobe for which pneumoperitoneum cannot be excluded, image 149/4. Musculoskeletal: Multiple healed right rib  fractures identified. No acute or suspicious osseous findings. Review of the MIP images confirms the above findings. IMPRESSION: 1. No evidence for acute pulmonary embolus. 2. Small to moderate bilateral pleural effusions. Diffuse interstitial edema is identified. Correlate for signs/symptoms of congestive heart failure. 3. Atelectasis and airspace consolidation noted in the right middle lobe and right lower lobe. 4. Several foci of gas are identified overlying the anterolateral margin of the right hepatic lobe for which pneumoperitoneum cannot be excluded. 5. Coronary artery calcification. 6.  Aortic Atherosclerosis (ICD10-I70.0). Critical Value/emergent results were called by telephone at the time of interpretation on 12/12/2022 at 6:53 pm to provider [facility] , who verbally acknowledged these results. Electronically Signed   By: Signa Kell M.D.   On: 12/12/2022 18:53   DG Chest 2 View  Addendum Date: 12/12/2022   ADDENDUM REPORT: 12/12/2022 16:09 ADDENDUM: Corrected report: Original report was generated with an error due to voice recognition software, corrected as follows: Comparison dated chest x-ray dated May 15, 2016, rib series dated May 15, 2017 Electronically Signed   By: Allegra Lai M.D.   On: 12/12/2022 16:09   Result Date: 12/12/2022 CLINICAL DATA:  Shortness of breath EXAM: CHEST - 2 VIEW COMPARISON:  Chest x-ray dated May 15, 2026 FINDINGS: New asymmetric fullness of the right hilum. Normal heart size. Small right-greater-than-left pleural effusions. No evidence of pneumothorax. Old left-sided rib fractures. IMPRESSION: 1. New asymmetric fullness of the right hilum. Recommend contrast-enhanced chest CT to exclude right hilar mass. 2. Small right-greater-than-left pleural effusions. Electronically Signed: By: Allegra Lai M.D. On: 12/12/2022 15:22    Microbiology: Recent Results (from the past 240 hour(s))  Respiratory (~20 pathogens) panel by PCR     Status: None    Collection Time: 12/12/22  7:44 PM   Specimen: Nasopharyngeal Swab; Respiratory  Result Value Ref Range Status   Adenovirus NOT DETECTED NOT DETECTED Final   Coronavirus 229E NOT DETECTED NOT DETECTED Final    Comment: (NOTE) The Coronavirus on the Respiratory Panel, DOES NOT test for the novel  Coronavirus (2019 nCoV)    Coronavirus HKU1 NOT DETECTED NOT DETECTED Final   Coronavirus NL63 NOT DETECTED NOT DETECTED Final   Coronavirus OC43 NOT DETECTED NOT DETECTED Final   Metapneumovirus NOT DETECTED NOT DETECTED Final   Rhinovirus / Enterovirus NOT DETECTED NOT DETECTED Final   Influenza A NOT DETECTED NOT DETECTED Final   Influenza B NOT DETECTED NOT DETECTED Final   Parainfluenza Virus 1 NOT DETECTED NOT DETECTED Final   Parainfluenza Virus 2 NOT DETECTED NOT DETECTED Final   Parainfluenza Virus 3 NOT DETECTED NOT DETECTED Final   Parainfluenza Virus 4 NOT DETECTED NOT DETECTED Final   Respiratory Syncytial Virus NOT DETECTED NOT DETECTED Final   Bordetella pertussis NOT DETECTED NOT DETECTED Final   Bordetella Parapertussis NOT DETECTED NOT DETECTED Final   Chlamydophila  pneumoniae NOT DETECTED NOT DETECTED Final   Mycoplasma pneumoniae NOT DETECTED NOT DETECTED Final    Comment: Performed at [facility], [address], [place], [place] [address]  SARS Coronavirus 2 by RT PCR (hospital order, performed in [facility] hospital lab) *cepheid single result test* Anterior Nasal Swab     Status: None   Collection Time: 12/13/22  9:30 AM   Specimen: Anterior Nasal Swab  Result Value Ref Range Status   SARS Coronavirus 2 by RT PCR NEGATIVE NEGATIVE Final    Comment: (NOTE) SARS-CoV-2 target nucleic acids are NOT DETECTED.  The SARS-CoV-2 RNA is generally detectable in upper and lower respiratory specimens during the acute phase of infection. The lowest concentration of SARS-CoV-2 viral copies this assay can detect is 250 copies / mL. A negative result does not preclude  SARS-CoV-2 infection and should not be used as the sole basis for treatment or other Whitney management decisions.  A negative result may occur with improper specimen collection / handling, submission of specimen other than nasopharyngeal swab, presence of viral mutation(s) within the areas targeted by this assay, and inadequate number of viral copies (<250 copies / mL). A negative result must be combined with clinical observations, Whitney history, and epidemiological information.  Fact Sheet for Patients:   RoadLapTop.co.za  Fact Sheet for Healthcare Providers: http://kim-miller.com/  This test is not yet approved or  cleared by the [place] FDA and has been authorized for detection and/or diagnosis of SARS-CoV-2 by FDA under an Emergency Use Authorization (EUA).  This EUA will remain in effect (meaning this test can be used) for the duration of the COVID-19 declaration under Section 564(b)(1) of the Act, 21 U.S.C. section 360bbb-3(b)(1), unless the authorization is terminated or revoked sooner.  Performed at [facility], [address] [place], [place] [address]      Labs: Basic Metabolic Panel: Recent Labs  Lab 12/12/22 2316 12/13/22 0208 12/14/22 0629 12/14/22 1148 12/14/22 1337 12/14/22 1347 12/15/22 0504 12/15/22 1449 12/16/22 0448  NA 126* 127* 133*  --  135 134* 137  --  138  K 3.8 3.5 2.8*   < > 3.0* 2.9* 2.7* 4.4 4.5  CL 91* 90* 96*  --   --   --  95*  --  105  CO2 23 26 29   --   --   --  28  --  24  GLUCOSE 173* 134* 115*  --   --   --  108*  --  110*  BUN 22 19 15   --   --   --  18  --  27*  CREATININE 0.97 0.90 0.88  --   --   --  0.97  --  0.92  CALCIUM 8.6* 8.2* 7.8*  --   --   --  8.2*  --  8.9  MG  --  1.8 2.2  --   --   --  2.3  --   --    < > = values in this interval not displayed.   Liver Function Tests: No results for input(s): "AST", "ALT", "ALKPHOS", "BILITOT", "PROT", "ALBUMIN"  in the last 168 hours. No results for input(s): "LIPASE", "AMYLASE" in the last 168 hours. No results for input(s): "AMMONIA" in the last 168 hours. CBC: Recent Labs  Lab 12/12/22 1229 12/13/22 0208 12/14/22 0629 12/14/22 1337 12/14/22 1347 12/15/22 0504  WBC 13.6* 11.3* 8.0  --   --  8.9  NEUTROABS  --  8.3*  --   --   --   --   HGB 12.2 11.9* 12.2 12.9 12.9 13.8  HCT 37.8 35.7* 36.8 38.0 38.0 42.5  MCV 86.9 84.2 84.2  --   --  86.4  PLT 354.0 319 326  --   --  299   Cardiac Enzymes: No results for input(s): "CKTOTAL", "CKMB", "CKMBINDEX", "TROPONINI" in the last 168 hours. BNP: BNP (last 3 results) No results for input(s): "BNP" in the last 8760 hours.  ProBNP (last 3 results) Recent Labs    12/12/22 1229  PROBNP 1,663.0*    CBG: Recent Labs  Lab 12/15/22 0757 12/15/22 1130 12/15/22 1632 12/15/22 2311 12/16/22 0906  GLUCAP 118* 109* 135* 174* 127*       Signed:  Silvano Bilis MD.  [facility] 12/16/2022, 12:27 PM

## 2022-12-17 LAB — LIPOPROTEIN A (LPA): Lipoprotein (a): 48.3 nmol/L — ABNORMAL HIGH (ref <75.0)

## 2022-12-18 ENCOUNTER — Telehealth: Payer: Self-pay | Admitting: *Deleted

## 2022-12-18 NOTE — Transitions of Care (Post Inpatient/ED Visit) (Signed)
12/18/2022  Name: Whitney Knight MRN: [id number] DOB: [birth year]  Today's TOC FU Call Status: Today's TOC FU Call Status:: Successful TOC FU Call Completed TOC FU Call Complete Date: 12/18/22  Transition Care Management Follow-up Telephone Call Date of Discharge: 12/16/22 Discharge Facility: [facility]) Type of Discharge: Inpatient Admission Primary Inpatient Discharge Diagnosis:: Acute systolic heart failure How have you been since you were released from the hospital?: Better Any questions or concerns?: No  Items Reviewed: Did you receive and understand the discharge instructions provided?: Yes Medications obtained,verified, and reconciled?: Yes (Medications Reviewed) Any new allergies since your discharge?: No Dietary orders reviewed?: No Do you have support at home?: Yes People in Home: spouse Name of Support/Comfort Primary Source: russell/ and Daughter  Medications Reviewed Today: Medications Reviewed Today     Reviewed by Luella Cook, RN (Case Manager) on 12/18/22 at 1207  Med List Status: <None>   Medication Order Taking? Sig Documenting Provider Last Dose Status Informant  albuterol (VENTOLIN HFA) 108 (90 Base) MCG/ACT inhaler [id number] Yes Inhale 2 puffs into the lungs every 6 (six) hours as needed for wheezing or shortness of breath. Dana Allan, MD Taking Active Self  aspirin EC 81 MG tablet [id number] Yes Take 1 tablet (81 mg total) by mouth daily. Swallow whole. Wouk, Wilfred Curtis, MD Taking Active   blood glucose meter kit and supplies KIT [id number] Yes One touch ultra mini. Use 2-4 times per day. Glori Luis, MD Taking Active Self  carvedilol (COREG) 3.125 MG tablet [id number] Yes Take 1 tablet (3.125 mg total) by mouth 2 (two) times daily with a meal. Wouk, Wilfred Curtis, MD Taking Active   Continuous Glucose Receiver [facility] G7 RECEIVER) Hardie Pulley [id number] Yes Use to check glucose at least every 8 hours. Glori Luis,  MD Taking Active Self  Continuous Glucose Sensor (DEXCOM G7 [place]) [place] [id number] Yes Apply every 10 days Glori Luis, MD Taking Active Self  dapagliflozin propanediol (FARXIGA) 10 MG TABS tablet [id number] Yes Take 1 tablet (10 mg total) by mouth daily. Wouk, Wilfred Curtis, MD Taking Active   fluticasone [facility]) 50 MCG/ACT nasal spray [id number] Yes USE TWO SPRAYS IN EACH NOSTRIL DAILY Glori Luis, MD Taking Active Self  furosemide (LASIX) 40 MG tablet [id number] Yes Take 1 tablet (40 mg total) by mouth daily. Wouk, Wilfred Curtis, MD Taking Active   Lancets [facility] K[facility] PLUS [place]) MISC [id number] Yes USE ONCE DAILY AS DIRECTED Glori Luis, MD Taking Active Self  levothyroxine (SYNTHROID) 75 MCG tablet [id number] Yes TAKE 1 TABLET BY MOUTH DAILY Glori Luis, MD Taking Active Self  [facility] ULTRA test strip [id number] Yes USE TO CHECK BLOOD SUGAR UP TO THREE TIMES DAILY Glori Luis, MD Taking Active Self  saccharomyces boulardii (FLORASTOR) 250 MG capsule [id number]  Take 1 capsule (250 mg total) by mouth daily. Dana Allan, MD  Active Self  spironolactone (ALDACTONE) 25 MG tablet [id number]  Take 0.5 tablets (12.5 mg total) by mouth daily. Wouk, Wilfred Curtis, MD  Active   ticagrelor [facility]) 90 MG TABS tablet [id number] Yes Take 1 tablet (90 mg total) by mouth 2 (two) times daily. Wouk, Wilfred Curtis, MD Taking Active   tirzepatide [facility]) 15 MG/0.5ML Pen [id number] Yes Inject 15 mg into the skin once a week. Glori Luis, MD Taking Active Self  Zoledronic Acid (RECLAST IV) [id number] No Inject into the vein. Once a year  Patient not taking: Reported on 12/12/2022   [provider] Not Taking Active Self            Home Care and Equipment/Supplies: Were Home Health Services Ordered?: NA Any new equipment or medical supplies ordered?: Yes Name of Medical supply agency?: [employer] Were you able to get the equipment/medical supplies?: Yes Do you  have any questions related to the use of the equipment/supplies?: No  Functional Questionnaire: Do you need assistance with bathing/showering or dressing?: Yes Do you need assistance with meal preparation?: Yes Do you need assistance with eating?: No Do you have difficulty maintaining continence: No Do you need assistance with getting out of bed/getting out of a chair/moving?: No Do you have difficulty managing or taking your medications?: No  Follow up appointments reviewed: PCP Follow-up appointment confirmed?: Yes Date of PCP follow-up appointment?: 01/03/23 Follow-up Provider: [facility] Follow-up appointment confirmed?: Yes Date of Specialist follow-up appointment?: 12/21/22 Follow-Up Specialty Provider:: Eula Listen PA Do you need transportation to your follow-up appointment?: No Do you understand care options if your condition(s) worsen?: Yes-patient verbalized understanding  SDOH Interventions Today    Flowsheet Row Most Recent Value  SDOH Interventions   Food Insecurity Interventions Intervention Not Indicated  Housing Interventions Intervention Not Indicated  Transportation Interventions Intervention Not Indicated      Interventions Today    Flowsheet Row Most Recent Value  General Interventions   General Interventions Discussed/Reviewed General Interventions Discussed, General Interventions Reviewed, Doctor Visits  Doctor Visits Discussed/Reviewed Doctor Visits Discussed, Doctor Visits Reviewed  Exercise Interventions   Exercise Discussed/Reviewed Exercise Discussed  [Patient has walked a few laps this morning]  Pharmacy Interventions   Pharmacy Dicussed/Reviewed Pharmacy Topics Discussed, Pharmacy Topics Reviewed  Safety Interventions   Safety Discussed/Reviewed Safety Discussed  [patient didn't feel she needed a medical alert since she is wearing a [employer] lifevest and keeps her phone near her at all times.]      TOC Interventions Today     Flowsheet Row Most Recent Value  TOC Interventions   TOC Interventions Discussed/Reviewed TOC Interventions Discussed, TOC Interventions Reviewed       Gean Maidens BSN RN [facility] [phone]

## 2022-12-19 NOTE — Progress Notes (Unsigned)
Cardiology Office Note    Date:  12/21/2022   ID:  Whitney Knight, Whitney Knight 06/24/1950, MRN [id number]  PCP:  Glori Luis, MD  Cardiologist:  Yvonne Kendall, MD  Electrophysiologist:  None   Chief Complaint: Hospital follow-up  History of Present Illness:   Whitney Knight is a 72 y.o. female with history of CAD with late presenting anterior ST elevation MI, HFrEF secondary to ICM, DM2, HTN, HLD, and hypothyroidism who presents for hospital follow-up as outlined below.  score in 2022 of 37.1 which was the 64th percentile.  She was admitted to [facility] with a late presenting anterior ST elevation MI and acute HFrEF.  Initially, concerns were for myocarditis given multiple sick contacts.  Troponin peaked at 2024.  BNP 1663.  COVID/respiratory panel negative.  Echo demonstrated an EF of 30 to 35%, WMA, G2DD, normal RV systolic function, ventricular cavity size, and PASP, mildly dilated left atrium, moderate pleural effusion in the left and right lateral regions, mild to moderate mitral regurgitation, moderate tricuspid regurgitation, and an estimated right atrial pressure of 3 mmHg.  R/LHC showed an occluded proximal to mid LAD which was treated with PCI/DES (overall difficult procedure).  Otherwise, no significant disease in a somewhat codominant system with a wraparound LAD providing the distal half to one third of PDA territory.  Hemodynamic findings were consistent with mild pulmonary hypertension.  She had symptomatic improvement with PCI, IV diuresis, and escalation of GDMT.  Discharged home with LifeVest.  She comes in accompanied by her husband today and is doing very well from a cardiac perspective.  No symptoms of angina or cardiac decompensation.  No dyspnea, palpitations, dizziness, presyncope, or syncope.  Adherent and tolerating cardiac medications including DAPT without missing any doses.  No falls, hematochezia, or melena.  Wearing a LifeVest without alarms/gongs.  Prefers to avoid  statin therapy.  Appetite is beginning to pick up.  Weight downtrending some.  Has follow-up with advanced heart failure next week.  Notes left ear fullness associated with allergies.   Labs independently reviewed: 11/2022 - potassium 4.5, BUN 27, serum creatinine 0.92, magnesium 2.3, LP(a) 48.3, Hgb 13.8, PLT 299, TC 141, TG 91, HDL 36, LDL 87, TSH normal 07/2022 - A1c 7.0 05/2022 - albumin 4.2, AST/ALT normal  Past Medical History:  Diagnosis Date   Allergy    Anemia    Arthritis    KNEES   Cataract    Chronic sinusitis    Cough    Diabetes mellitus    Diverticulosis    GERD (gastroesophageal reflux disease)    Headache(784.0)    [facility] in past   Hypertension    Osteoporosis    Thyroid disease     Past Surgical History:  Procedure Laterality Date   bone graft     for dental surgery   CARPAL TUNNEL RELEASE Right 02/27/2013   [facility]   COLONOSCOPY  2014   CORONARY STENT INTERVENTION N/A 12/14/2022   Procedure: CORONARY STENT INTERVENTION;  Surgeon: Marykay Lex, MD;  Location: [facility] INVASIVE CV LAB;  Service: Cardiovascular;  Laterality: N/A;   dental implants     DENTAL SURGERY     DENTAL SURGERY     dental implant   NASAL SINUS SURGERY  06/2008   Dr. Jenne Campus   RIGHT/LEFT HEART CATH AND CORONARY ANGIOGRAPHY N/A 12/14/2022   Procedure: RIGHT/LEFT HEART CATH AND CORONARY ANGIOGRAPHY;  Surgeon: Marykay Lex, MD;  Location: [facility] INVASIVE CV LAB;  Service: Cardiovascular;  Laterality: N/A;   TRIGGER FINGER RELEASE Right 02/27/2013   [facility]   UPPER GASTROINTESTINAL ENDOSCOPY     VAGINAL DELIVERY     2    Current Medications: Current Meds  Medication Sig   albuterol (VENTOLIN HFA) 108 (90 Base) MCG/ACT inhaler Inhale 2 puffs into the lungs every 6 (six) hours as needed for wheezing or shortness of breath.   aspirin EC 81 MG tablet Take 1 tablet (81 mg total) by mouth daily. Swallow whole.   blood glucose meter kit and supplies KIT One touch ultra mini. Use  2-4 times per day.   carvedilol (COREG) 3.125 MG tablet Take 1 tablet (3.125 mg total) by mouth 2 (two) times daily with a meal.   Continuous Glucose Receiver (DEXCOM G7 RECEIVER) DEVI Use to check glucose at least every 8 hours.   Continuous Glucose Sensor (DEXCOM G7 SENSOR) MISC Apply every 10 days   dapagliflozin propanediol (FARXIGA) 10 MG TABS tablet Take 1 tablet (10 mg total) by mouth daily.   fluticasone (FLONASE) 50 MCG/ACT nasal spray USE TWO SPRAYS IN EACH NOSTRIL DAILY   furosemide (LASIX) 40 MG tablet Take 1 tablet (40 mg total) by mouth daily.   Lancets (ONETOUCH DELICA PLUS LANCET33G) MISC USE ONCE DAILY AS DIRECTED   levothyroxine (SYNTHROID) 75 MCG tablet TAKE 1 TABLET BY MOUTH DAILY   ONETOUCH ULTRA test strip USE TO CHECK BLOOD SUGAR UP TO THREE TIMES DAILY   saccharomyces boulardii (FLORASTOR) 250 MG capsule Take 1 capsule (250 mg total) by mouth daily.   spironolactone (ALDACTONE) 25 MG tablet Take 0.5 tablets (12.5 mg total) by mouth daily.   ticagrelor (BRILINTA) 90 MG TABS tablet Take 1 tablet (90 mg total) by mouth 2 (two) times daily.   tirzepatide (MOUNJARO) 15 MG/0.5ML Pen Inject 15 mg into the skin once a week.    Allergies:   Clarithromycin, Jardiance [empagliflozin], Penicillins, and Prednisone   Social History   Socioeconomic History   Marital status: Married    Spouse name: Not on file   Number of children: 2   Years of education: Not on file   Highest education level: Not on file  Occupational History   Occupation: [employer]    Employer: [employer]  Tobacco Use   Smoking status: Never   Smokeless tobacco: Never  Vaping Use   Vaping status: Never Used  Substance and Sexual Activity   Alcohol use: Yes    Comment: occ   Drug use: No   Sexual activity: Not on file  Other Topics Concern   Not on file  Social History Narrative   Lives with husband. No pets, 2 children.      Work - [employer]      Diet - regular   Exercise - none  presently   Social Determinants of Health   Financial Resource Strain: Patient Declined (12/12/2022)   Overall Financial Resource Strain (CARDIA)    Difficulty of Paying Living Expenses: Patient declined  Food Insecurity: No Food Insecurity (12/18/2022)   Hunger Vital Sign    Worried About Running Out of Food in the Last Year: Never true    Ran Out of Food in the Last Year: Never true  Transportation Needs: No Transportation Needs (12/18/2022)   PRAPARE - Administrator, Civil Service (Medical): No    Lack of Transportation (Non-Medical): No  Physical Activity: Sufficiently Active (12/12/2022)   Exercise Vital Sign    Days of Exercise per Week: 7 days  Minutes of Exercise per Session: 30 min  Stress: No Stress Concern Present (12/12/2022)   [employer] of Occupational Health - Occupational Stress Questionnaire    Feeling of Stress : Not at all  Social Connections: Unknown (12/12/2022)   Social Connection and Isolation Panel [NHANES]    Frequency of Communication with Friends and Family: Patient declined    Frequency of Social Gatherings with Friends and Family: Patient declined    Attends Religious Services: Patient declined    Database administrator or Organizations: Patient declined    Attends Engineer, structural: More than 4 times per year    Marital Status: Married     Family History:  The patient's family history includes COPD in her father; Diabetes in her maternal grandmother; Heart disease in her father and mother; Hypertension in her father. There is no history of Colon cancer, Esophageal cancer, Pancreatic cancer, Stomach cancer, or Liver disease.  ROS:   12-point review of systems is negative unless otherwise noted in the HPI.   EKGs/Labs/Other Studies Reviewed:    Studies reviewed were summarized above. The additional studies were reviewed today:  Calcium score 02/24/2021: IMPRESSION AND RECOMMENDATION: 1. Coronary calcium score of 37.1.  This was 64th percentile for age and sex matched control. 2. CAC 1-99 in LAD.  CAC-DRS A1/N1. 3. Continue heart healthy lifestyle and risk factor modification. 4. Consider statin therapy due to Age >55. __________   2D echo 12/13/2022: 1. Left ventricular ejection fraction, by estimation, is 30 to 35%. The  left ventricle has moderately decreased function. The left ventricle  demonstrates regional wall motion abnormalities (see scoring  diagram/findings for description). Left ventricular   diastolic parameters are consistent with Grade II diastolic dysfunction  (pseudonormalization). Elevated left atrial pressure. The average left  ventricular global longitudinal strain is -6.7 %. The global longitudinal  strain is abnormal.   2. Right ventricular systolic function is normal. The right ventricular  size is normal. There is normal pulmonary artery systolic pressure.   3. Left atrial size was mildly dilated.   4. Moderate pleural effusion in both left and right lateral regions.   5. The mitral valve is abnormal. Mild to moderate mitral valve  regurgitation. No evidence of mitral stenosis.   6. Tricuspid valve regurgitation is moderate.   7. The aortic valve is tricuspid. Aortic valve regurgitation is not  visualized. No aortic stenosis is present.   8. The inferior vena cava is normal in size with greater than 50%  respiratory variability, suggesting right atrial pressure of 3 mmHg.   Conclusion(s)/Recommendation(s): No left ventricular mural or apical thrombus/thrombi.  __________   [facility] 12/14/2022:   CULPRIT LESION: Prox LAD to Mid LAD lesion is 100% stenosed with 0% stenosed side branch in 1st Diag.   A drug-eluting stent was successfully placed using a STENT ONYX FRONTIER 2.5X30. => Taper postdilation from 3.1 to 2.8 mm   Post intervention, there is a 0% residual stenosis.  TIMI-3 flow restored.   Post intervention, the side branch was reduced to 0% residual stenosis distally  (very small vessel); 1st Diag lesion is 100% stenosed distally.   Hemodynamic findings consistent with mild pulmonary hypertension.   There is no aortic valve stenosis.   Anticipated discharge date to be determined.   Patient still has need for additional titration of CHF/C medications along with additional diuresis   Recommend uninterrupted dual antiplatelet therapy with Aspirin 81mg  daily and Ticagrelor 90mg  twice daily for a minimum of 12 months (  ACS-Class I recommendation).   Would continue Thienopyridine monotherapy with either clopidogrel 75 mg daily or ticagrelor 60 mg twice daily long-term based on 30 mm stent in the proximal LAD.   POST-OPERATIVE DIAGNOSIS:   Severe single-vessel disease with 100% occlusion of the prox-mid LAD at SP1 otherwise no significant disease in a somewhat codominant system with the wraparound LAD providing the distal half to one third of the PDA territory. Difficult but successful DES PCI of the LAD with a Resolute Onyx DES 2.5 mm x 30 mm stent postdilated and tapered fashion from 3.1 to 2.8 mm.  2 small diagonal branches noted after stent with 1 small diagonal that barely feels that arose from the original occluded section. Consistent combined CHF with known EF of 30 to 35% and PCWP of 29 millimercury and LVEDP of 21 mL of mercury, but preserved cardiac output put and index with cardiac output of 5.44 and index of 3.16. RHC numbers: RAP 5 mmHg; RV P-EDP 46/0-8 mmHg; PAP-mean 46/19-32 mmHg (mild to moderate WHO class II-LV CHF related), PCWP 28 mmHg.  LV P-EDP 113/5-21 mmHg; AO P-MAP 104/68-81 mmHg.  Ao sat 90%, PA sat 68%.     PLAN OF CARE:  Patient continues to be inpatient.  I have written for IV Lasix 60 mg to be given today post cath (early PM dose).  Will continue with IV diuresis given elevated LVEDP and PCWP - INCREASE TO 60mg  BID starting tonite.  DAPT for minimum 1 year but would like to continue Thienopyridine for lifetime based on length of stent in the  LAD.  Continue to titrate GDMT for CAD and CM/CHF.  Phase 2 CRH ordered.   EKG:  EKG is ordered today.  The EKG ordered today demonstrates NSR, 87 bpm, nonspecific IVCD, baseline artifact, piror anteroseptal infarct, nonspecific lateral st/t changes  Recent Labs: 05/18/2022: ALT 31 12/12/2022: Pro B Natriuretic peptide (BNP) 1,663.0; TSH 2.723 12/15/2022: Hemoglobin 13.8; Magnesium 2.3; Platelets 299 12/16/2022: BUN 27; Creatinine, Ser 0.92; Potassium 4.5; Sodium 138  Recent Lipid Panel    Component Value Date/Time   CHOL 141 12/14/2022 0629   CHOL 202 (H) 04/04/2021 1034   TRIG 91 12/14/2022 0629   HDL 36 (L) 12/14/2022 0629   HDL 72 04/04/2021 1034   CHOLHDL 3.9 12/14/2022 0629   VLDL 18 12/14/2022 0629   LDLCALC 87 12/14/2022 0629   LDLCALC 113 (H) 04/04/2021 1034   LDLDIRECT 118 (H) 10/04/2021 1058    PHYSICAL EXAM:    VS:  BP 100/64 (BP Location: Left Arm, Patient Position: Sitting, Cuff Size: Normal)   Pulse 87   Ht 5\' 4"  (1.626 m)   Wt 133 lb (60.3 kg)   BMI 22.83 kg/m   BMI: Body mass index is 22.83 kg/m.  Physical Exam Vitals reviewed.  Constitutional:      Appearance: She is well-developed.  HENT:     Head: Normocephalic and atraumatic.  Eyes:     General:        Right eye: No discharge.        Left eye: No discharge.  Neck:     Vascular: No JVD.  Cardiovascular:     Rate and Rhythm: Normal rate and regular rhythm.     Pulses:          Posterior tibial pulses are 2+ on the right side and 2+ on the left side.     Heart sounds: S1 normal and S2 normal. Heart sounds not distant. No midsystolic click and  no opening snap. Murmur heard.     Systolic murmur is present with a grade of 2/6 at the lower left sternal border.     No friction rub.  Pulmonary:     Effort: Pulmonary effort is normal. No respiratory distress.     Breath sounds: Normal breath sounds. No decreased breath sounds, wheezing or rales.  Chest:     Chest wall: No tenderness.  Abdominal:      General: There is no distension.  Musculoskeletal:     Cervical back: Normal range of motion.     Right lower leg: No edema.     Left lower leg: No edema.  Skin:    General: Skin is warm and dry.     Nails: There is no clubbing.  Neurological:     Mental Status: She is alert and oriented to person, place, and time.  Psychiatric:        Speech: Speech normal.        Behavior: Behavior normal.        Thought Content: Thought content normal.        Judgment: Judgment normal.     Wt Readings from Last 3 Encounters:  12/21/22 133 lb (60.3 kg)  12/16/22 135 lb (61.2 kg)  12/12/22 148 lb 6 oz (67.3 kg)     ASSESSMENT & PLAN:   CAD involving the native coronary arteries without angina: She is doing well and without symptoms concerning for angina.  Continue aggressive risk factor modification secondary prevention including DAPT with ASA 81 mg daily and Brilinta 90 mg twice daily for a minimum of 12 months dating back to date of PCI without interruption, along with continuation of carvedilol.  Declines statin and ezetimibe.  Prefers to avoid PCSK9 inhibitor, will restart.  Consider bempedoic acid or inclisiran.  Prefers red yeast rice and co-Q10.  Okay to participate with cardiac rehab.  No indication for further ischemic testing at this time.  Check CBC.  HFrEF secondary to ICM: Euvolemic and well compensated.  Remains on carvedilol 3.125 mg twice daily, Farxiga 10 mg, spironolactone 12.5 mg, and furosemide 40 mg daily.  Not currently on ACE inhibitor, ARB, or ARNi secondary to cough intolerance.  Relative hypotension precludes escalation of GDMT at this time.  LifeVest remains in place without alarms.  Look to escalate GDMT as able moving forward with plans for repeat echo in 3 months to evaluate for improvement in cardiomyopathy following PCI of the LAD and on optimal medical therapy.  Should her cardiomyopathy persist at that time with an EF less than 35%, will refer to EP for consideration of  ICD.  QRS not currently wide enough for CRT.  Check BMP.  Refer to advanced heart failure service.  HTN: Blood pressure is well-controlled in the office today.  Continue medical therapy as outlined above.  HLD with statin intolerance: LDL 87 in 11/2022 with target LDL being less than 55.  Statin intolerant.  She is uncertain if she would want to move forward with newer cholesterol agents.  Prefers red yeast rice.    Disposition: F/u with Dr. Okey Dupre or an APP in 1 month.   Medication Adjustments/Labs and Tests Ordered: Current medicines are reviewed at length with the patient today.  Concerns regarding medicines are outlined above. Medication changes, Labs and Tests ordered today are summarized above and listed in the Patient Instructions accessible in Encounters.   Elinor Dodge, PA-C 12/21/2022 12:17 PM     [facility] -  [place] [address] [place], [place] [address] [phone]

## 2022-12-21 ENCOUNTER — Encounter: Payer: Self-pay | Admitting: Physician Assistant

## 2022-12-21 ENCOUNTER — Encounter: Payer: Self-pay | Admitting: Family Medicine

## 2022-12-21 ENCOUNTER — Ambulatory Visit: Payer: Medicare Other | Attending: Physician Assistant | Admitting: Physician Assistant

## 2022-12-21 VITALS — BP 100/64 | HR 87 | Ht 64.0 in | Wt 133.0 lb

## 2022-12-21 DIAGNOSIS — E785 Hyperlipidemia, unspecified: Secondary | ICD-10-CM | POA: Diagnosis not present

## 2022-12-21 DIAGNOSIS — I255 Ischemic cardiomyopathy: Secondary | ICD-10-CM

## 2022-12-21 DIAGNOSIS — Z789 Other specified health status: Secondary | ICD-10-CM | POA: Diagnosis not present

## 2022-12-21 DIAGNOSIS — I2510 Atherosclerotic heart disease of native coronary artery without angina pectoris: Secondary | ICD-10-CM

## 2022-12-21 DIAGNOSIS — I5020 Unspecified systolic (congestive) heart failure: Secondary | ICD-10-CM

## 2022-12-21 DIAGNOSIS — I10 Essential (primary) hypertension: Secondary | ICD-10-CM | POA: Diagnosis not present

## 2022-12-21 NOTE — Patient Instructions (Signed)
Medication Instructions:  Your physician recommends the following medication changes.  It's okay to start taking Zyrtec Over-The-Counter as needed for allergy symptoms  *If you need a refill on your cardiac medications before your next appointment, please call your pharmacy*   Lab Work: BMET, Magnesium level and CBC  If you have labs (blood work) drawn today and your tests are completely normal, you will receive your results only by: MyChart Message (if you have MyChart) OR A paper copy in the mail If you have any lab test that is abnormal or we need to change your treatment, we will call you to review the results.   Testing/Procedures: None  Follow-Up: At [facility], you and your health needs are our priority.  As part of our continuing mission to provide you with exceptional heart care, we have created designated Provider Care Teams.  These Care Teams include your primary Cardiologist (physician) and Advanced Practice Providers (APPs -  Physician Assistants and Nurse Practitioners) who all work together to provide you with the care you need, when you need it.  We recommend signing up for the patient portal called "MyChart".  Sign up information is provided on this After Visit Summary.  MyChart is used to connect with patients for Virtual Visits (Telemedicine).  Patients are able to view lab/test results, encounter notes, upcoming appointments, etc.  Non-urgent messages can be sent to your provider as well.   To learn more about what you can do with MyChart, go to [email or web address].    Your next appointment:   1 month(s)  Provider:   You may see Yvonne Kendall, MD or one of the following Advanced Practice Providers on your designated Care Team:    Eula Listen, [place]

## 2022-12-22 ENCOUNTER — Other Ambulatory Visit: Payer: Self-pay | Admitting: *Deleted

## 2022-12-22 ENCOUNTER — Other Ambulatory Visit: Payer: Self-pay

## 2022-12-22 DIAGNOSIS — I5020 Unspecified systolic (congestive) heart failure: Secondary | ICD-10-CM

## 2022-12-22 LAB — CBC
Hematocrit: 48 % — ABNORMAL HIGH (ref 34.0–46.6)
Hemoglobin: 15.4 g/dL (ref 11.1–15.9)
MCH: 27.8 pg (ref 26.6–33.0)
MCHC: 32.1 g/dL (ref 31.5–35.7)
MCV: 87 fL (ref 79–97)
Platelets: 333 10*3/uL (ref 150–450)
RBC: 5.54 x10E6/uL — ABNORMAL HIGH (ref 3.77–5.28)
RDW: 13.5 % (ref 11.7–15.4)
WBC: 11.8 10*3/uL — ABNORMAL HIGH (ref 3.4–10.8)

## 2022-12-22 LAB — BASIC METABOLIC PANEL
BUN/Creatinine Ratio: 26 (ref 12–28)
BUN: 36 mg/dL — ABNORMAL HIGH (ref 8–27)
CO2: 26 mmol/L (ref 20–29)
Calcium: 9.7 mg/dL (ref 8.7–10.3)
Chloride: 92 mmol/L — ABNORMAL LOW (ref 96–106)
Creatinine, Ser: 1.37 mg/dL — ABNORMAL HIGH (ref 0.57–1.00)
Glucose: 149 mg/dL — ABNORMAL HIGH (ref 70–99)
Potassium: 4.4 mmol/L (ref 3.5–5.2)
Sodium: 136 mmol/L (ref 134–144)
eGFR: 42 mL/min/{1.73_m2} — ABNORMAL LOW (ref >59)

## 2022-12-22 LAB — MAGNESIUM: Magnesium: 2.5 mg/dL — ABNORMAL HIGH (ref 1.6–2.3)

## 2022-12-22 MED ORDER — FUROSEMIDE 20 MG PO TABS: 20.0000 mg | ORAL_TABLET | Freq: Every day | ORAL | Status: DC

## 2022-12-25 ENCOUNTER — Telehealth: Payer: Self-pay | Admitting: Pharmacist

## 2022-12-25 NOTE — Progress Notes (Signed)
Received voicemail from [facility] requesting chart notes for patient to support DexCom order.   Please fax Dr. Purvis Sheffield most recent note to [facility] at [phone].   Thanks!

## 2022-12-25 NOTE — Telephone Encounter (Signed)
Chart notes was geni faxed.

## 2022-12-27 ENCOUNTER — Ambulatory Visit: Payer: Medicare Other | Attending: Internal Medicine | Admitting: Internal Medicine

## 2022-12-27 ENCOUNTER — Encounter: Payer: Medicare Other | Attending: Cardiology | Admitting: *Deleted

## 2022-12-27 ENCOUNTER — Encounter: Payer: Self-pay | Admitting: Internal Medicine

## 2022-12-27 ENCOUNTER — Encounter: Payer: Self-pay | Admitting: *Deleted

## 2022-12-27 VITALS — BP 108/87 | HR 90 | Wt 131.4 lb

## 2022-12-27 DIAGNOSIS — I2510 Atherosclerotic heart disease of native coronary artery without angina pectoris: Secondary | ICD-10-CM | POA: Diagnosis not present

## 2022-12-27 DIAGNOSIS — I214 Non-ST elevation (NSTEMI) myocardial infarction: Secondary | ICD-10-CM

## 2022-12-27 DIAGNOSIS — I255 Ischemic cardiomyopathy: Secondary | ICD-10-CM | POA: Diagnosis not present

## 2022-12-27 DIAGNOSIS — I5020 Unspecified systolic (congestive) heart failure: Secondary | ICD-10-CM | POA: Diagnosis not present

## 2022-12-27 DIAGNOSIS — Z789 Other specified health status: Secondary | ICD-10-CM

## 2022-12-27 DIAGNOSIS — Z955 Presence of coronary angioplasty implant and graft: Secondary | ICD-10-CM

## 2022-12-27 MED ORDER — CANDESARTAN CILEXETIL 4 MG PO TABS: 2.0000 mg | ORAL_TABLET | Freq: Every day | ORAL | 5 refills | Status: DC

## 2022-12-27 MED ORDER — FUROSEMIDE 20 MG PO TABS: 20.0000 mg | ORAL_TABLET | ORAL | 5 refills | Status: DC | PRN

## 2022-12-27 NOTE — Progress Notes (Signed)
ADVANCED HF CLINIC CONSULT NOTE  Referring Physician: Glori Luis, MD Primary Care: Glori Luis, MD Primary Cardiologist: Yvonne Kendall, MD  HPI:  Whitney Knight is a 72 y.o. female with history of CAD with late presenting anterior ST elevation MI, HFrEF secondary to ICM, DM2, HTN, HLD, and hypothyroidism who presents for hospital follow-up as outlined below.  Has long h/o DM2. Retired from [employer] 2 years ago and lost nearly 50 pounds with walking program.    She was admitted to [facility] with a late presenting anterior ST elevation MI and acute HFrEF.  Initially, concerns were for myocarditis given multiple sick contacts.  Troponin peaked at 2024.  BNP 1663.  COVID/respiratory panel negative.  Echo EF of 30-35%, WMA, G2DD, normal RV systolic function, ventricular cavity size, and PASP, mildly dilated left atrium, moderate pleural effusion in the left and right lateral regions, mild to moderate mitral regurgitation, moderate tricuspid regurgitation, and an estimated right atrial pressure of 3 mmHg.  R/LHC showed an occluded proximal to mid LAD which was treated with PCI/DES (overall difficult procedure).  Otherwise, no significant disease in a somewhat codominant system with a wraparound LAD providing the distal half to one third of PDA territory.  Hemodynamic findings were consistent with mild pulmonary hypertension.  She had symptomatic improvement with PCI, IV diuresis, and escalation of GDMT.  Discharged home with LifeVest.   She comes in accompanied by her daughter today. Feeling much better. Walking a mile a day (1/2 mile at a time). No CP or SOB. Compliant with meds. Wearing LifeVest. No alarms. Taking lasix 20 daily. Refuses losartan due to cough. Meeting with CR today.   M died at 45 from SCD D died at 71 h/o CHF, COPD No siblings   Review of Systems: [y] = yes, [ ]  = no   General: Weight gain [ ] ; Weight loss [ ] ; Anorexia [ ] ; Fatigue [ ] ; Fever [ ] ; Chills [ ] ;  Weakness [ ]   Cardiac: Chest pain/pressure [ y]; Resting SOB [ ] ; Exertional SOB [ ] ; Orthopnea [ ] ; Pedal Edema [ ] ; Palpitations [ ] ; Syncope [ ] ; Presyncope [ ] ; Paroxysmal nocturnal dyspnea[ ]   Pulmonary: Cough [ ] ; Wheezing[ ] ; Hemoptysis[ ] ; Sputum [ ] ; Snoring [ ]   GI: Vomiting[ ] ; Dysphagia[ ] ; Melena[ ] ; Hematochezia [ ] ; Heartburn[ ] ; Abdominal pain [ ] ; Constipation [ ] ; Diarrhea [ ] ; BRBPR [ ]   GU: Hematuria[ ] ; Dysuria [ ] ; Nocturia[ ]   Vascular: Pain in legs with walking [ ] ; Pain in feet with lying flat [ ] ; Non-healing sores [ ] ; Stroke [ ] ; TIA [ ] ; Slurred speech [ ] ;  Neuro: Headaches[ ] ; Vertigo[ ] ; Seizures[ ] ; Paresthesias[ ] ;Blurred vision [ ] ; Diplopia [ ] ; Vision changes [ ]   Ortho/Skin: Arthritis [ y]; Joint pain [ y]; Muscle pain [ ] ; Joint swelling [ ] ; Back Pain [ ] ; Rash [ ]   Psych: Depression[ ] ; Anxiety[ ]   Heme: Bleeding problems [ ] ; Clotting disorders [ ] ; Anemia [ y]  Endocrine: Diabetes [place]ienne ]; Thyroid dysfunction[ y]   Past Medical History:  Diagnosis Date   Allergy    Anemia    Arthritis    KNEES   Cataract    Chronic sinusitis    Cough    Diabetes mellitus    Diverticulosis    GERD (gastroesophageal reflux disease)    Headache(784.0)    [facility] in past   Hypertension    Osteoporosis    Thyroid  disease     Current Outpatient Medications  Medication Sig Dispense Refill   albuterol (VENTOLIN HFA) 108 (90 Base) MCG/ACT inhaler Inhale 2 puffs into the lungs every 6 (six) hours as needed for wheezing or shortness of breath. 8 g 0   aspirin EC 81 MG tablet Take 1 tablet (81 mg total) by mouth daily. Swallow whole.     blood glucose meter kit and supplies KIT One touch ultra mini. Use 2-4 times per day. 1 each 0   carvedilol (COREG) 3.125 MG tablet Take 1 tablet (3.125 mg total) by mouth 2 (two) times daily with a meal. 180 tablet 1   Continuous Glucose Receiver (DEXCOM G7 RECEIVER) DEVI Use to check glucose at least every 8 hours. 1  each 0   Continuous Glucose Sensor (DEXCOM G7 SENSOR) MISC Apply every 10 days 3 each 3   dapagliflozin propanediol (FARXIGA) 10 MG TABS tablet Take 1 tablet (10 mg total) by mouth daily. 90 tablet 1   fluticasone (FLONASE) 50 MCG/ACT nasal spray USE TWO SPRAYS IN EACH NOSTRIL DAILY 16 g 1   furosemide (LASIX) 20 MG tablet Take 1 tablet (20 mg total) by mouth daily.     Lancets (ONETOUCH DELICA PLUS LANCET33G) MISC USE ONCE DAILY AS DIRECTED 100 each 1   levOCARNitine (L-CARNITINE PO) Take 2 tablets by mouth daily at 6 (six) AM.     levothyroxine (SYNTHROID) 75 MCG tablet TAKE 1 TABLET BY MOUTH DAILY 90 tablet 1   ONETOUCH ULTRA test strip USE TO CHECK BLOOD SUGAR UP TO THREE TIMES DAILY 400 strip 1   Red Yeast Rice Extract (RED YEAST RICE PO) Take 2 tablets by mouth daily at 6 (six) AM.     saccharomyces boulardii (FLORASTOR) 250 MG capsule Take 1 capsule (250 mg total) by mouth daily. 90 capsule 0   spironolactone (ALDACTONE) 25 MG tablet Take 0.5 tablets (12.5 mg total) by mouth daily. 45 tablet 1   ticagrelor (BRILINTA) 90 MG TABS tablet Take 1 tablet (90 mg total) by mouth 2 (two) times daily. 180 tablet 1   tirzepatide (MOUNJARO) 15 MG/0.5ML Pen Inject 15 mg into the skin once a week. 6 mL 3   Zoledronic Acid (RECLAST IV) Inject into the vein. Once a year     No current facility-administered medications for this visit.    Allergies  Allergen Reactions   Clarithromycin     REACTION: Nausea   Jardiance [Empagliflozin] Nausea And Vomiting   Penicillins     REACTION: Throat swelling   Prednisone Swelling    Patient states it "closed her esophagus and she had to get it stretched."      Social History   Socioeconomic History   Marital status: Married    Spouse name: Not on file   Number of children: 2   Years of education: Not on file   Highest education level: Not on file  Occupational History   Occupation: [employer]    Employer: [employer]  Tobacco Use    Smoking status: Never   Smokeless tobacco: Never  Vaping Use   Vaping status: Never Used  Substance and Sexual Activity   Alcohol use: Yes    Comment: occ   Drug use: No   Sexual activity: Not on file  Other Topics Concern   Not on file  Social History Narrative   Lives with husband. No pets, 2 children.      Work - [employer]      Diet - regular  Exercise - none presently   Social Determinants of Health   Financial Resource Strain: Patient Declined (12/12/2022)   Overall Financial Resource Strain (CARDIA)    Difficulty of Paying Living Expenses: Patient declined  Food Insecurity: No Food Insecurity (12/18/2022)   Hunger Vital Sign    Worried About Running Out of Food in the Last Year: Never true    Ran Out of Food in the Last Year: Never true  Transportation Needs: No Transportation Needs (12/18/2022)   PRAPARE - Administrator, Civil Service (Medical): No    Lack of Transportation (Non-Medical): No  Physical Activity: Sufficiently Active (12/12/2022)   Exercise Vital Sign    Days of Exercise per Week: 7 days    Minutes of Exercise per Session: 30 min  Stress: No Stress Concern Present (12/12/2022)   [employer] of Occupational Health - Occupational Stress Questionnaire    Feeling of Stress : Not at all  Social Connections: Unknown (12/12/2022)   Social Connection and Isolation Panel [NHANES]    Frequency of Communication with Friends and Family: Patient declined    Frequency of Social Gatherings with Friends and Family: Patient declined    Attends Religious Services: Patient declined    Database administrator or Organizations: Patient declined    Attends Engineer, structural: More than 4 times per year    Marital Status: Married  Catering manager Violence: Not At Risk (12/13/2022)   Humiliation, Afraid, Rape, and Kick questionnaire    Fear of Current or Ex-Partner: No    Emotionally Abused: No    Physically Abused: No    Sexually Abused: No       Family History  Problem Relation Age of Onset   COPD Father    Hypertension Father    Heart disease Father    Heart disease Mother    Diabetes Maternal Grandmother    Colon cancer Neg Hx    Esophageal cancer Neg Hx    Pancreatic cancer Neg Hx    Stomach cancer Neg Hx    Liver disease Neg Hx     Vitals:   12/27/22 1302  BP: 108/87  Pulse: 90  SpO2: 100%  Weight: 131 lb 6.4 oz (59.6 kg)    PHYSICAL EXAM: General:  Well appearing. No respiratory difficulty HEENT: normal Neck: supple. no JVD. Carotids 2+ bilat; no bruits. No lymphadenopathy or thryomegaly appreciated. Cor: PMI nondisplaced. Regular rate & rhythm. No rubs, gallops or murmurs. + LifeVest Lungs: clear Abdomen: soft, nontender, nondistended. No hepatosplenomegaly. No bruits or masses. Good bowel sounds. Extremities: no cyanosis, clubbing, rash, edema  Neuro: alert & oriented x 3, cranial nerves grossly intact. moves all 4 extremities w/o difficulty. Affect pleasant.   ASSESSMENT & PLAN:  1. Acute systolic HF - due to iCM - OOH anterior MI 8/24 -> now s/p PCI/DES to LAD - Echo 8/24 EF of 30 to 35%, WMA, G2DD - NYHA II - Volume status ok/low - Change lasix to PRN - Continue spiro to 12.5 daily - Continue Farxiga 10 - Continue carvedilol 3.125 bid - Add candesartan 2mg  daily as BP tolerates  - Continue Lifevest for now - Repeat echo in 3 months to decide on ICD - Labs today  2. CAD - s/p OOH anterior MI 8/24 -> now s/p PCI/DES to LAD. No other CAD - Continue DAPT and red yeast rice (intolerant of statins)  - Management per primary cardiology - Has referral to CR  3. DM2 - on SGLT2i  and GLP1-RA  Arvilla Meres, MD  1:40 PM

## 2022-12-27 NOTE — Patient Instructions (Addendum)
STOP taking Lasix daily and ONLY take it when you NEED it  START CANDESARTAN 2 MG daily (1/2 tab)  Go DOWN to LOWER LEVEL and have your blood work completed

## 2022-12-27 NOTE — Progress Notes (Signed)
Virtual orientation call completed today. shehas an appointment on Date: 01/04/2023  for EP eval and gym Orientation.  Documentation of diagnosis can be found in [facility] 12/12/2022 .

## 2022-12-28 ENCOUNTER — Ambulatory Visit: Payer: Medicare Other | Admitting: Family Medicine

## 2022-12-28 LAB — CBC
Hematocrit: 42.8 % (ref 34.0–46.6)
Hemoglobin: 14.4 g/dL (ref 11.1–15.9)
MCH: 28.5 pg (ref 26.6–33.0)
MCHC: 33.6 g/dL (ref 31.5–35.7)
MCV: 85 fL (ref 79–97)
Platelets: 209 10*3/uL (ref 150–450)
RBC: 5.05 x10E6/uL (ref 3.77–5.28)
RDW: 13.6 % (ref 11.7–15.4)
WBC: 8.4 10*3/uL (ref 3.4–10.8)

## 2022-12-28 LAB — BASIC METABOLIC PANEL
BUN/Creatinine Ratio: 36 — ABNORMAL HIGH (ref 12–28)
BUN: 40 mg/dL — ABNORMAL HIGH (ref 8–27)
CO2: 24 mmol/L (ref 20–29)
Calcium: 9.4 mg/dL (ref 8.7–10.3)
Chloride: 95 mmol/L — ABNORMAL LOW (ref 96–106)
Creatinine, Ser: 1.12 mg/dL — ABNORMAL HIGH (ref 0.57–1.00)
Glucose: 121 mg/dL — ABNORMAL HIGH (ref 70–99)
Potassium: 4.5 mmol/L (ref 3.5–5.2)
Sodium: 136 mmol/L (ref 134–144)
eGFR: 53 mL/min/{1.73_m2} — ABNORMAL LOW (ref >59)

## 2022-12-28 LAB — BRAIN NATRIURETIC PEPTIDE: BNP: 423.5 pg/mL — ABNORMAL HIGH (ref 0.0–100.0)

## 2022-12-28 LAB — MAGNESIUM: Magnesium: 2.4 mg/dL — ABNORMAL HIGH (ref 1.6–2.3)

## 2023-01-03 ENCOUNTER — Encounter: Payer: Self-pay | Admitting: Family Medicine

## 2023-01-03 ENCOUNTER — Telehealth (INDEPENDENT_AMBULATORY_CARE_PROVIDER_SITE_OTHER): Payer: Medicare Other | Admitting: Family Medicine

## 2023-01-03 VITALS — BP 107/73 | Ht 64.0 in | Wt 129.8 lb

## 2023-01-03 DIAGNOSIS — I2510 Atherosclerotic heart disease of native coronary artery without angina pectoris: Secondary | ICD-10-CM | POA: Diagnosis not present

## 2023-01-03 DIAGNOSIS — I255 Ischemic cardiomyopathy: Secondary | ICD-10-CM | POA: Diagnosis not present

## 2023-01-03 DIAGNOSIS — E1165 Type 2 diabetes mellitus with hyperglycemia: Secondary | ICD-10-CM | POA: Diagnosis not present

## 2023-01-03 MED ORDER — TIRZEPATIDE 12.5 MG/0.5ML ~~LOC~~ SOAJ: 12.5000 mg | SUBCUTANEOUS | 2 refills | Status: DC

## 2023-01-03 NOTE — Assessment & Plan Note (Signed)
Chronic issue.  Status post drug-eluting stent placement.  She will continue on ticagrelor 90 mg twice daily.  Discussed statin therapy versus PCSK9 inhibitor and she declines treatment for cholesterol at this time.  She is currently taking red yeast rice for this.  We will continue to monitor her lipid panel.

## 2023-01-03 NOTE — Assessment & Plan Note (Signed)
Chronic issue.  Patient will continue to see the advanced heart failure clinic.  She will remain on candesartan 2 mg daily, carvedilol 3.125 mg twice daily, Farxiga 10 mg daily, Lasix 20 mg daily as needed, and spironolactone 12.5 mg daily.  Monitor for symptoms.

## 2023-01-03 NOTE — Assessment & Plan Note (Signed)
Chronic issue.  Seemingly better controlled based on home CBG reports.  We will reduce Mounjaro to 12.5 mg weekly.  She will have her A1c checked through the heart failure clinic in the near future.  She will also continue Farxiga 10 mg daily.

## 2023-01-03 NOTE — Progress Notes (Signed)
Virtual Visit via video Note  I connected with Whitney Knight today at  9:00 AM EDT by a video enabled telemedicine application and verified that I am speaking with the correct person using two identifiers. Location patient: home Location provider: work Persons participating in the virtual visit: patient, provider  I discussed the limitations, risks, security and privacy concerns of performing an evaluation and management service by telephone and the availability of in person appointments. I also discussed with the patient that there may be a patient responsible charge related to this service. The patient expressed understanding and agreed to proceed.  Reason for visit: f/u  HPI: Diabetes: Patient notes her sugars have been excellent since she has been eating better after recent hospital stay for cardiac issue.  Notes her sugar was 98 this morning and is 117 now.  She is on [place].  Had a low sugar last night though drank grape juice and did fine after that.  Patient notes the heart failure specialist wondered about reducing her Mounjaro dose to limit further weight loss.  Plans to have A1c checked through the heart failure clinic in the next week or 2.  CHF/NSTEMI/CAD: Patient was hospitalized after developing progressive swelling and dyspnea.  Notes it started after having a scratchy throat and having respiratory illness.  She notes she was negative for COVID and strep throat.  She had a cardiac catheterization that revealed 100% occlusion of the proximal and mid LAD.  She had a drug-eluting stent placed for that.  She has been on spironolactone, ticagrelor, candesartan, and carvedilol.  She has not required any Lasix since discharge.  She is wearing her LifeVest through the CHF clinic.  They plan to repeat an echo in about 3 months and consider ICD placement at that time.  Blood pressures have been 95/68-107/73.  She notes no chest pain, shortness of breath, orthopnea, PND, or  edema.  She is back to walking 1.5 miles per day.  She is following with the advanced heart failure clinic in [place].  Patient notes she discussed her cholesterol with CHF and primary cardiology and she reports they were both fine with her not being on a statin.  She refuses statin therapy at this time given side effects she saw with her husband.   ROS: See pertinent positives and negatives per HPI.  Past Medical History:  Diagnosis Date   Allergy    Anemia    Arthritis    KNEES   Cataract    Chronic sinusitis    Cough    Diabetes mellitus    Diverticulosis    GERD (gastroesophageal reflux disease)    Headache(784.0)    [facility] in past   Hypertension    Osteoporosis    Thyroid disease     Past Surgical History:  Procedure Laterality Date   bone graft     for dental surgery   CARPAL TUNNEL RELEASE Right 02/27/2013   [facility]   COLONOSCOPY  2014   CORONARY STENT INTERVENTION N/A 12/14/2022   Procedure: CORONARY STENT INTERVENTION;  Surgeon: Marykay Lex, MD;  Location: [facility] INVASIVE CV LAB;  Service: Cardiovascular;  Laterality: N/A;   dental implants     DENTAL SURGERY     DENTAL SURGERY     dental implant   NASAL SINUS SURGERY  06/2008   Dr. Jenne Campus   RIGHT/LEFT HEART CATH AND CORONARY ANGIOGRAPHY N/A 12/14/2022   Procedure: RIGHT/LEFT HEART CATH AND CORONARY ANGIOGRAPHY;  Surgeon: Marykay Lex,  MD;  Location: [facility] INVASIVE CV LAB;  Service: Cardiovascular;  Laterality: N/A;   TRIGGER FINGER RELEASE Right 02/27/2013   [facility]   UPPER GASTROINTESTINAL ENDOSCOPY     VAGINAL DELIVERY     2    Family History  Problem Relation Age of Onset   COPD Father    Hypertension Father    Heart disease Father    Heart disease Mother    Diabetes Maternal Grandmother    Colon cancer Neg Hx    Esophageal cancer Neg Hx    Pancreatic cancer Neg Hx    Stomach cancer Neg Hx    Liver disease Neg Hx     SOCIAL HX: Non-smoker   Current Outpatient Medications:     albuterol (VENTOLIN HFA) 108 (90 Base) MCG/ACT inhaler, Inhale 2 puffs into the lungs every 6 (six) hours as needed for wheezing or shortness of breath., Disp: 8 g, Rfl: 0   aspirin EC 81 MG tablet, Take 1 tablet (81 mg total) by mouth daily. Swallow whole., Disp: , Rfl:    blood glucose meter kit and supplies KIT, One touch ultra mini. Use 2-4 times per day., Disp: 1 each, Rfl: 0   candesartan (ATACAND) 4 MG tablet, Take 0.5 tablets (2 mg total) by mouth daily., Disp: 15 tablet, Rfl: 5   carvedilol (COREG) 3.125 MG tablet, Take 1 tablet (3.125 mg total) by mouth 2 (two) times daily with a meal., Disp: 180 tablet, Rfl: 1   Continuous Glucose Receiver (DEXCOM G7 RECEIVER) DEVI, Use to check glucose at least every 8 hours., Disp: 1 each, Rfl: 0   Continuous Glucose Sensor (DEXCOM G7 SENSOR) MISC, Apply every 10 days, Disp: 3 each, Rfl: 3   dapagliflozin propanediol (FARXIGA) 10 MG TABS tablet, Take 1 tablet (10 mg total) by mouth daily., Disp: 90 tablet, Rfl: 1   fluticasone (FLONASE) 50 MCG/ACT nasal spray, USE TWO SPRAYS IN EACH NOSTRIL DAILY, Disp: 16 g, Rfl: 1   Lancets (ONETOUCH DELICA PLUS LANCET33G) MISC, USE ONCE DAILY AS DIRECTED, Disp: 100 each, Rfl: 1   levOCARNitine (L-CARNITINE PO), Take 2 tablets by mouth daily at 6 (six) AM., Disp: , Rfl:    levothyroxine (SYNTHROID) 75 MCG tablet, TAKE 1 TABLET BY MOUTH DAILY, Disp: 90 tablet, Rfl: 1   ONETOUCH ULTRA test strip, USE TO CHECK BLOOD SUGAR UP TO THREE TIMES DAILY, Disp: 400 strip, Rfl: 1   Red Yeast Rice Extract (RED YEAST RICE PO), Take 2 tablets by mouth daily at 6 (six) AM., Disp: , Rfl:    saccharomyces boulardii (FLORASTOR) 250 MG capsule, Take 1 capsule (250 mg total) by mouth daily., Disp: 90 capsule, Rfl: 0   spironolactone (ALDACTONE) 25 MG tablet, Take 0.5 tablets (12.5 mg total) by mouth daily., Disp: 45 tablet, Rfl: 1   ticagrelor (BRILINTA) 90 MG TABS tablet, Take 1 tablet (90 mg total) by mouth 2 (two) times daily., Disp:  180 tablet, Rfl: 1   tirzepatide (MOUNJARO) 12.5 MG/0.5ML Pen, Inject 12.5 mg into the skin once a week., Disp: 6 mL, Rfl: 2  EXAM:  VITALS per patient if applicable:  GENERAL: alert, oriented, appears well and in no acute distress  HEENT: atraumatic, conjunttiva clear, no obvious abnormalities on inspection of external nose and ears  NECK: normal movements of the head and neck  LUNGS: on inspection no signs of respiratory distress, breathing rate appears normal, no obvious gross SOB, gasping or wheezing  CV: no obvious cyanosis  MS: moves all visible extremities  without noticeable abnormality  PSYCH/NEURO: pleasant and cooperative, no obvious depression or anxiety, speech and thought processing grossly intact  ASSESSMENT AND PLAN:  Discussed the following assessment and plan:  Problem List Items Addressed This Visit     Coronary artery disease    Chronic issue.  Status post drug-eluting stent placement.  She will continue on ticagrelor 90 mg twice daily.  Discussed statin therapy versus PCSK9 inhibitor and she declines treatment for cholesterol at this time.  She is currently taking red yeast rice for this.  We will continue to monitor her lipid panel.      Ischemic cardiomyopathy    Chronic issue.  Patient will continue to see the advanced heart failure clinic.  She will remain on candesartan 2 mg daily, carvedilol 3.125 mg twice daily, Farxiga 10 mg daily, Lasix 20 mg daily as needed, and spironolactone 12.5 mg daily.  Monitor for symptoms.      Uncontrolled type 2 diabetes mellitus with hyperglycemia (HCC) - Primary (Chronic)    Chronic issue.  Seemingly better controlled based on home CBG reports.  We will reduce Mounjaro to 12.5 mg weekly.  She will have her A1c checked through the heart failure clinic in the near future.  She will also continue Farxiga 10 mg daily.      Relevant Medications   tirzepatide (MOUNJARO) 12.5 MG/0.5ML Pen   Other Relevant Orders   HgB A1c     Return in about 4 months (around 05/05/2023) for DM.   I discussed the assessment and treatment plan with the patient. The patient was provided an opportunity to ask questions and all were answered. The patient agreed with the plan and demonstrated an understanding of the instructions.   The patient was advised to call back or seek an in-person evaluation if the symptoms worsen or if the condition fails to improve as anticipated.  Marikay Alar, MD

## 2023-01-04 ENCOUNTER — Encounter: Payer: Medicare Other | Attending: Cardiology

## 2023-01-04 ENCOUNTER — Telehealth: Payer: Self-pay | Admitting: Physician Assistant

## 2023-01-04 VITALS — Ht 64.0 in | Wt 134.7 lb

## 2023-01-04 DIAGNOSIS — Z48812 Encounter for surgical aftercare following surgery on the circulatory system: Secondary | ICD-10-CM | POA: Diagnosis not present

## 2023-01-04 DIAGNOSIS — Z955 Presence of coronary angioplasty implant and graft: Secondary | ICD-10-CM | POA: Diagnosis not present

## 2023-01-04 DIAGNOSIS — I214 Non-ST elevation (NSTEMI) myocardial infarction: Secondary | ICD-10-CM

## 2023-01-04 DIAGNOSIS — I252 Old myocardial infarction: Secondary | ICD-10-CM | POA: Diagnosis not present

## 2023-01-04 NOTE — Telephone Encounter (Signed)
Reviewed home BP log.  Following candesartan 2 mg blood pressures were running in the 80s systolic.  Was subsequently ordered with BP running in the 90s to low 100s systolic.  Given relative hypotension, we will hold candesartan for now.

## 2023-01-04 NOTE — Progress Notes (Signed)
Cardiac Individual Treatment Plan  Patient Details  Name: Whitney Knight MRN: [id number] Date of Birth: 09/03/1950 Referring Provider:   Flowsheet Row Cardiac Rehab from 01/04/2023 in [facility] Cardiac and Pulmonary Rehab  Referring Provider Dr. Herbie Baltimore       Initial Encounter Date:  Flowsheet Row Cardiac Rehab from 01/04/2023 in [facility] Cardiac and Pulmonary Rehab  Date 01/04/23       Visit Diagnosis: NSTEMI (non-ST elevation myocardial infarction) [facility])  Status post coronary artery stent placement  Patient's Home Medications on Admission:  Current Outpatient Medications:    albuterol (VENTOLIN HFA) 108 (90 Base) MCG/ACT inhaler, Inhale 2 puffs into the lungs every 6 (six) hours as needed for wheezing or shortness of breath., Disp: 8 g, Rfl: 0   aspirin EC 81 MG tablet, Take 1 tablet (81 mg total) by mouth daily. Swallow whole., Disp: , Rfl:    blood glucose meter kit and supplies KIT, One touch ultra mini. Use 2-4 times per day., Disp: 1 each, Rfl: 0   candesartan (ATACAND) 4 MG tablet, Take 0.5 tablets (2 mg total) by mouth daily., Disp: 15 tablet, Rfl: 5   carvedilol (COREG) 3.125 MG tablet, Take 1 tablet (3.125 mg total) by mouth 2 (two) times daily with a meal., Disp: 180 tablet, Rfl: 1   Continuous Glucose Receiver (DEXCOM G7 RECEIVER) DEVI, Use to check glucose at least every 8 hours., Disp: 1 each, Rfl: 0   Continuous Glucose Sensor (DEXCOM G7 SENSOR) MISC, Apply every 10 days, Disp: 3 each, Rfl: 3   dapagliflozin propanediol (FARXIGA) 10 MG TABS tablet, Take 1 tablet (10 mg total) by mouth daily., Disp: 90 tablet, Rfl: 1   fluticasone (FLONASE) 50 MCG/ACT nasal spray, USE TWO SPRAYS IN EACH NOSTRIL DAILY, Disp: 16 g, Rfl: 1   Lancets (ONETOUCH DELICA PLUS LANCET33G) MISC, USE ONCE DAILY AS DIRECTED, Disp: 100 each, Rfl: 1   levOCARNitine (L-CARNITINE PO), Take 2 tablets by mouth daily at 6 (six) AM., Disp: , Rfl:    levothyroxine (SYNTHROID) 75 MCG tablet, TAKE 1 TABLET BY MOUTH  DAILY, Disp: 90 tablet, Rfl: 1   ONETOUCH ULTRA test strip, USE TO CHECK BLOOD SUGAR UP TO THREE TIMES DAILY, Disp: 400 strip, Rfl: 1   Red Yeast Rice Extract (RED YEAST RICE PO), Take 2 tablets by mouth daily at 6 (six) AM., Disp: , Rfl:    saccharomyces boulardii (FLORASTOR) 250 MG capsule, Take 1 capsule (250 mg total) by mouth daily., Disp: 90 capsule, Rfl: 0   spironolactone (ALDACTONE) 25 MG tablet, Take 0.5 tablets (12.5 mg total) by mouth daily., Disp: 45 tablet, Rfl: 1   ticagrelor (BRILINTA) 90 MG TABS tablet, Take 1 tablet (90 mg total) by mouth 2 (two) times daily., Disp: 180 tablet, Rfl: 1   tirzepatide (MOUNJARO) 12.5 MG/0.5ML Pen, Inject 12.5 mg into the skin once a week., Disp: 6 mL, Rfl: 2  Past Medical History: Past Medical History:  Diagnosis Date   Allergy    Anemia    Arthritis    KNEES   Cataract    Chronic sinusitis    Cough    Diabetes mellitus    Diverticulosis    GERD (gastroesophageal reflux disease)    Headache(784.0)    [facility] in past   Hypertension    Osteoporosis    Thyroid disease     Tobacco Use: Social History   Tobacco Use  Smoking Status Never  Smokeless Tobacco Never    Labs: Review Flowsheet  More  data exists      Latest Ref Rng & Units 10/04/2021 01/11/2022 05/18/2022 08/21/2022 12/14/2022  Labs for ITP Cardiac and Pulmonary Rehab  Cholestrol 0 - 200 mg/dL - - 474  - 259   LDL (calc) 0 - 99 mg/dL - - 563  - 87   Direct LDL 0 - 99 mg/dL 875  - - - -  HDL-C >64 mg/dL - - 33.29  - 36   Trlycerides <150 mg/dL - - 518.8  - 91   Hemoglobin A1c 4.6 - 6.5 % 7.4  7.1  7.4  7.0  -  PH, Arterial 7.35 - 7.45 - - - - 7.493   PCO2 arterial 32 - 48 mmHg - - - - 39.8   Bicarbonate 20.0 - 28.0 mmol/L - - - - 31.7  30.5   TCO2 22 - 32 mmol/L - - - - 33  32   O2 Saturation % - - - - 68  92     Details       Multiple values from one day are sorted in reverse-chronological order          Exercise Target Goals: Exercise  Program Goal: Individual exercise prescription set using results from initial 6 min walk test and THRR while considering  patient's activity barriers and safety.   Exercise Prescription Goal: Initial exercise prescription builds to 30-45 minutes a day of aerobic activity, 2-3 days per week.  Home exercise guidelines will be given to patient during program as part of exercise prescription that the participant will acknowledge.   Education: Aerobic Exercise: - Group verbal and visual presentation on the components of exercise prescription. Introduces F.I.T.T principle from ACSM for exercise prescriptions.  Reviews F.I.T.T. principles of aerobic exercise including progression. Written material given at graduation. Flowsheet Row Cardiac Rehab from 01/04/2023 in [facility] Cardiac and Pulmonary Rehab  Education need identified 01/04/23       Education: Resistance Exercise: - Group verbal and visual presentation on the components of exercise prescription. Introduces F.I.T.T principle from ACSM for exercise prescriptions  Reviews F.I.T.T. principles of resistance exercise including progression. Written material given at graduation.    Education: Exercise & Equipment Safety: - Individual verbal instruction and demonstration of equipment use and safety with use of the equipment. Flowsheet Row Cardiac Rehab from 01/04/2023 in [facility] Cardiac and Pulmonary Rehab  Date 01/04/23  Educator MB  Instruction Review Code 1- Verbalizes Understanding       Education: Exercise Physiology & General Exercise Guidelines: - Group verbal and written instruction with models to review the exercise physiology of the cardiovascular system and associated critical values. Provides general exercise guidelines with specific guidelines to those with heart or lung disease.    Education: Flexibility, Balance, Mind/Body Relaxation: - Group verbal and visual presentation with interactive activity on the components of exercise  prescription. Introduces F.I.T.T principle from ACSM for exercise prescriptions. Reviews F.I.T.T. principles of flexibility and balance exercise training including progression. Also discusses the mind body connection.  Reviews various relaxation techniques to help reduce and manage stress (i.e. Deep breathing, progressive muscle relaxation, and visualization). Balance handout provided to take home. Written material given at graduation.   Activity Barriers & Risk Stratification:  Activity Barriers & Cardiac Risk Stratification - 01/04/23 1651       Activity Barriers & Cardiac Risk Stratification   Activity Barriers None    Cardiac Risk Stratification Moderate             6 Minute Walk:  6 Minute Walk     Row Name 01/04/23 1639         6 Minute Walk   Phase Initial     Distance 1380 feet     Walk Time 6 minutes     # of Rest Breaks 0     MPH 2.61     METS 3.18     RPE 7     Perceived Dyspnea  0     VO2 Peak 11.13     Symptoms No     Resting HR 97 bpm     Resting BP 100/62     Resting Oxygen Saturation  99 %     Exercise Oxygen Saturation  during 6 min walk 99 %     Max Ex. HR 113 bpm     Max Ex. BP 112/70     2 Minute Post BP 106/60              Oxygen Initial Assessment:   Oxygen Re-Evaluation:   Oxygen Discharge (Final Oxygen Re-Evaluation):   Initial Exercise Prescription:  Initial Exercise Prescription - 01/04/23 1600       Date of Initial Exercise RX and Referring Provider   Date 01/04/23    Referring Provider Dr. Herbie Baltimore      Oxygen   Maintain Oxygen Saturation 88% or higher      Treadmill   MPH 2.6    Grade 0    Minutes 15    METs 3      Recumbant Bike   Level 3    RPM 50    Watts 22    Minutes 15    METs 3      NuStep   Level 3    SPM 80    Minutes 15    METs 3      Prescription Details   Frequency (times per week) 2    Duration Progress to 30 minutes of continuous aerobic without signs/symptoms of physical distress       Intensity   THRR 40-80% of Max Heartrate 118-139    Ratings of Perceived Exertion 11-13    Perceived Dyspnea 0-4      Progression   Progression Continue to progress workloads to maintain intensity without signs/symptoms of physical distress.      Resistance Training   Training Prescription Yes    Weight 2 lb    Reps 10-15             Perform Capillary Blood Glucose checks as needed.  Exercise Prescription Changes:   Exercise Prescription Changes     Row Name 01/04/23 1600             Response to Exercise   Blood Pressure (Admit) 100/62       Blood Pressure (Exercise) 112/70       Blood Pressure (Exit) 106/60       Heart Rate (Admit) 97 bpm       Heart Rate (Exercise) 113 bpm       Heart Rate (Exit) 84 bpm       Oxygen Saturation (Admit) 99 %       Oxygen Saturation (Exercise) 99 %       Oxygen Saturation (Exit) 99 %       Rating of Perceived Exertion (Exercise) 7       Perceived Dyspnea (Exercise) 0       Symptoms none       Comments results  Progression   Progression Continue to progress workloads to maintain intensity without signs/symptoms of physical distress.       Average METs 3                Exercise Comments:   Exercise Goals and Review:   Exercise Goals     Row Name 01/04/23 1645             Exercise Goals   Increase Physical Activity Yes       Intervention Provide advice, education, support and counseling about physical activity/exercise needs.;Develop an individualized exercise prescription for aerobic and resistive training based on initial evaluation findings, risk stratification, comorbidities and participant's personal goals.       Expected Outcomes Short Term: Attend rehab on a regular basis to increase amount of physical activity.;Long Term: Exercising regularly at least 3-5 days a week.;Long Term: Add in home exercise to make exercise part of routine and to increase amount of physical activity.       Increase  Strength and Stamina Yes       Intervention Provide advice, education, support and counseling about physical activity/exercise needs.;Develop an individualized exercise prescription for aerobic and resistive training based on initial evaluation findings, risk stratification, comorbidities and participant's personal goals.       Expected Outcomes Short Term: Increase workloads from initial exercise prescription for resistance, speed, and METs.;Short Term: Perform resistance training exercises routinely during rehab and add in resistance training at home;Long Term: Improve cardiorespiratory fitness, muscular endurance and strength as measured by increased METs and functional capacity ( )       Able to understand and use rate of perceived exertion (RPE) scale Yes       Intervention Provide education and explanation on how to use RPE scale       Expected Outcomes Short Term: Able to use RPE daily in rehab to express subjective intensity level;Long Term:  Able to use RPE to guide intensity level when exercising independently       Able to understand and use Dyspnea scale Yes       Intervention Provide education and explanation on how to use Dyspnea scale       Expected Outcomes Short Term: Able to use Dyspnea scale daily in rehab to express subjective sense of shortness of breath during exertion;Long Term: Able to use Dyspnea scale to guide intensity level when exercising independently       Knowledge and understanding of Target Heart Rate Range (THRR) Yes       Intervention Provide education and explanation of THRR including how the numbers were predicted and where they are located for reference       Expected Outcomes Short Term: Able to state/look up THRR;Long Term: Able to use THRR to govern intensity when exercising independently;Short Term: Able to use daily as guideline for intensity in rehab       Able to check pulse independently Yes       Intervention Provide education and demonstration on how  to check pulse in carotid and radial arteries.;Review the importance of being able to check your own pulse for safety during independent exercise       Expected Outcomes Short Term: Able to explain why pulse checking is important during independent exercise;Long Term: Able to check pulse independently and accurately       Understanding of Exercise Prescription Yes       Intervention Provide education, explanation, and written materials on patient's individual exercise prescription  Expected Outcomes Short Term: Able to explain program exercise prescription;Long Term: Able to explain home exercise prescription to exercise independently                Exercise Goals Re-Evaluation :   Discharge Exercise Prescription (Final Exercise Prescription Changes):  Exercise Prescription Changes - 01/04/23 1600       Response to Exercise   Blood Pressure (Admit) 100/62    Blood Pressure (Exercise) 112/70    Blood Pressure (Exit) 106/60    Heart Rate (Admit) 97 bpm    Heart Rate (Exercise) 113 bpm    Heart Rate (Exit) 84 bpm    Oxygen Saturation (Admit) 99 %    Oxygen Saturation (Exercise) 99 %    Oxygen Saturation (Exit) 99 %    Rating of Perceived Exertion (Exercise) 7    Perceived Dyspnea (Exercise) 0    Symptoms none    Comments results      Progression   Progression Continue to progress workloads to maintain intensity without signs/symptoms of physical distress.    Average METs 3             Nutrition:  Target Goals: Understanding of nutrition guidelines, daily intake of sodium 1500mg , cholesterol 200mg , calories 30% from fat and 7% or less from saturated fats, daily to have 5 or more servings of fruits and vegetables.  Education: All About Nutrition: -Group instruction provided by verbal, written material, interactive activities, discussions, models, and posters to present general guidelines for heart healthy nutrition including fat, fiber, MyPlate, the role of  sodium in heart healthy nutrition, utilization of the nutrition label, and utilization of this knowledge for meal planning. Follow up email sent as well. Written material given at graduation. Flowsheet Row Cardiac Rehab from 01/04/2023 in [facility] Cardiac and Pulmonary Rehab  Education need identified 01/04/23       Biometrics:  Pre Biometrics - 01/04/23 1645       Pre Biometrics   Height 5\' 4"  (1.626 m)    Weight 134 lb 11.2 oz (61.1 kg)    Waist Circumference 37.7 inches    Hip Circumference 40.5 inches    Waist to Hip Ratio 0.93 %    BMI (Calculated) 23.11    Single Leg Stand 3 seconds              Nutrition Therapy Plan and Nutrition Goals:  Nutrition Therapy & Goals - 01/04/23 1647       Personal Nutrition Goals   Nutrition Goal Meet with RD on 9/19      Intervention Plan   Intervention Prescribe, educate and counsel regarding individualized specific dietary modifications aiming towards targeted core components such as weight, hypertension, lipid management, diabetes, heart failure and other comorbidities.;Nutrition handout(s) given to patient.    Expected Outcomes Short Term Goal: Understand basic principles of dietary content, such as calories, fat, sodium, cholesterol and nutrients.;Long Term Goal: Adherence to prescribed nutrition plan.;Short Term Goal: A plan has been developed with personal nutrition goals set during dietitian appointment.             Nutrition Assessments:  MEDIFICTS Score Key: ?70 Need to make dietary changes  40-70 Heart Healthy Diet ? 40 Therapeutic Level Cholesterol Diet  Flowsheet Row Cardiac Rehab from 01/04/2023 in [facility] Cardiac and Pulmonary Rehab  Picture Your Plate Total Score on Admission 54      Picture Your Plate Scores: <16 Unhealthy dietary pattern with much room for improvement. 41-50 Dietary pattern unlikely to meet  recommendations for good health and room for improvement. 51-60 More healthful dietary pattern, with some  room for improvement.  >60 Healthy dietary pattern, although there may be some specific behaviors that could be improved.    Nutrition Goals Re-Evaluation:   Nutrition Goals Discharge (Final Nutrition Goals Re-Evaluation):   Psychosocial: Target Goals: Acknowledge presence or absence of significant depression and/or stress, maximize coping skills, provide positive support system. Participant is able to verbalize types and ability to use techniques and skills needed for reducing stress and depression.   Education: Stress, Anxiety, and Depression - Group verbal and visual presentation to define topics covered.  Reviews how body is impacted by stress, anxiety, and depression.  Also discusses healthy ways to reduce stress and to treat/manage anxiety and depression.  Written material given at graduation.   Education: Sleep Hygiene -Provides group verbal and written instruction about how sleep can affect your health.  Define sleep hygiene, discuss sleep cycles and impact of sleep habits. Review good sleep hygiene tips.    Initial Review & Psychosocial Screening:  Initial Psych Review & Screening - 12/27/22 1436       Initial Review   Current issues with None Identified      Family Dynamics   Good Support System? Yes   family, friends     Barriers   Psychosocial barriers to participate in program The patient should benefit from training in stress management and relaxation.      Screening Interventions   Interventions To provide support and resources with identified psychosocial needs;Provide feedback about the scores to participant;Encouraged to exercise    Expected Outcomes Short Term goal: Utilizing psychosocial counselor, staff and physician to assist with identification of specific Stressors or current issues interfering with healing process. Setting desired goal for each stressor or current issue identified.;Long Term Goal: Stressors or current issues are controlled or  eliminated.;Short Term goal: Identification and review with participant of any Quality of Life or Depression concerns found by scoring the questionnaire.;Long Term goal: The participant improves quality of Life and PHQ9 Scores as seen by post scores and/or verbalization of changes             Quality of Life Scores:   Quality of Life - 01/04/23 1646       Quality of Life   Select Quality of Life      Quality of Life Scores   Health/Function Pre 27.4 %    Socioeconomic Pre 23.25 %    Psych/Spiritual Pre 29.64 %    Family Pre 30 %    GLOBAL Pre 27.27 %            Scores of 19 and below usually indicate a poorer quality of life in these areas.  A difference of  2-3 points is a clinically meaningful difference.  A difference of 2-3 points in the total score of the Quality of Life Index has been associated with significant improvement in overall quality of life, self-image, physical symptoms, and general health in studies assessing change in quality of life.  PHQ-9: Review Flowsheet  More data exists      01/04/2023 01/03/2023 10/10/2022 08/21/2022 01/11/2022  Depression screen PHQ 2/9  Decreased Interest 0 0 0 0 0  Down, Depressed, Hopeless 0 0 0 0 0  PHQ - 2 Score 0 0 0 0 0  Altered sleeping 0 0 0 0 -  Tired, decreased energy 0 0 0 0 -  Change in appetite 0 0 0 0 -  Feeling bad or failure about yourself  0 0 0 0 -  Trouble concentrating 0 0 0 0 -  Moving slowly or fidgety/restless 0 - 0 0 -  Suicidal thoughts 0 0 0 0 -  PHQ-9 Score 0 0 0 0 -  Difficult doing work/chores Not difficult at all Not difficult at all Not difficult at all Not difficult at all -    Details           Interpretation of Total Score  Total Score Depression Severity:  1-4 = Minimal depression, 5-9 = Mild depression, 10-14 = Moderate depression, 15-19 = Moderately severe depression, 20-27 = Severe depression   Psychosocial Evaluation and Intervention:  Psychosocial Evaluation - 12/27/22 1451        Psychosocial Evaluation & Interventions   Comments Claudia has no barriers to attending the program. She lives with her husband, son and daughter.  They and her friends are her support. She is alredy walking about a mile a day in her home and has permission form her doctor to increase her distance. She is wearing a LIFE VEST.  She has lost about 50 pounds in the past 2 years. 10 pounds during her recent hospitalization. She does not want to lose more weight. She is ready to get started.    Expected Outcomes STG She attends all scheduled sessions, she works with the EP staff to progress her exercise back to her level prior to the NSTEMI. She will continue to monitor her weight loss. LTG continue to manage her esercise progression and her weight after discharge    Continue Psychosocial Services  Follow up required by staff             Psychosocial Re-Evaluation:   Psychosocial Discharge (Final Psychosocial Re-Evaluation):   Vocational Rehabilitation: Provide vocational rehab assistance to qualifying candidates.   Vocational Rehab Evaluation & Intervention:  Vocational Rehab - 12/27/22 1446       Initial Vocational Rehab Evaluation & Intervention   Assessment shows need for Vocational Rehabilitation No      Vocational Rehab Re-Evaulation   Comments retired             Education: Education Goals: Education classes will be provided on a variety of topics geared toward better understanding of heart health and risk factor modification. Participant will state understanding/return demonstration of topics presented as noted by education test scores.  Learning Barriers/Preferences:   General Cardiac Education Topics:  AED/CPR: - Group verbal and written instruction with the use of models to demonstrate the basic use of the AED with the basic ABC's of resuscitation.   Anatomy and Cardiac Procedures: - Group verbal and visual presentation and models provide information about  basic cardiac anatomy and function. Reviews the testing methods done to diagnose heart disease and the outcomes of the test results. Describes the treatment choices: Medical Management, Angioplasty, or Coronary Bypass Surgery for treating various heart conditions including Myocardial Infarction, Angina, Valve Disease, and Cardiac Arrhythmias.  Written material given at graduation. Flowsheet Row Cardiac Rehab from 01/04/2023 in [facility] Cardiac and Pulmonary Rehab  Education need identified 01/04/23       Medication Safety: - Group verbal and visual instruction to review commonly prescribed medications for heart and lung disease. Reviews the medication, class of the drug, and side effects. Includes the steps to properly store meds and maintain the prescription regimen.  Written material given at graduation.   Intimacy: - Group verbal instruction through game format to discuss how heart  and lung disease can affect sexual intimacy. Written material given at graduation..   Know Your Numbers and Heart Failure: - Group verbal and visual instruction to discuss disease risk factors for cardiac and pulmonary disease and treatment options.  Reviews associated critical values for Overweight/Obesity, Hypertension, Cholesterol, and Diabetes.  Discusses basics of heart failure: signs/symptoms and treatments.  Introduces Heart Failure Zone chart for action plan for heart failure.  Written material given at graduation. Flowsheet Row Cardiac Rehab from 01/04/2023 in [facility] Cardiac and Pulmonary Rehab  Education need identified 01/04/23       Infection Prevention: - Provides verbal and written material to individual with discussion of infection control including proper hand washing and proper equipment cleaning during exercise session. Flowsheet Row Cardiac Rehab from 01/04/2023 in [facility] Cardiac and Pulmonary Rehab  Date 01/04/23  Educator MB  Instruction Review Code 1- Verbalizes Understanding       Falls  Prevention: - Provides verbal and written material to individual with discussion of falls prevention and safety. Flowsheet Row Cardiac Rehab from 01/04/2023 in [facility] Cardiac and Pulmonary Rehab  Date 01/04/23  Educator MB  Instruction Review Code 1- Verbalizes Understanding       Other: -Provides group and verbal instruction on various topics (see comments)   Knowledge Questionnaire Score:  Knowledge Questionnaire Score - 01/04/23 1648       Knowledge Questionnaire Score   Pre Score 22/26             Core Components/Risk Factors/Patient Goals at Admission:  Personal Goals and Risk Factors at Admission - 01/04/23 1648       Core Components/Risk Factors/Patient Goals on Admission    Weight Management Yes;Weight Gain    Intervention Weight Management: Develop a combined nutrition and exercise program designed to reach desired caloric intake, while maintaining appropriate intake of nutrient and fiber, sodium and fats, and appropriate energy expenditure required for the weight goal.;Weight Management: Provide education and appropriate resources to help participant work on and attain dietary goals.;Weight Management/Obesity: Establish reasonable short term and long term weight goals.    Admit Weight 134 lb 11.2 oz (61.1 kg)    Goal Weight: Short Term 140 lb (63.5 kg)    Goal Weight: Long Term 140 lb (63.5 kg)    Expected Outcomes Short Term: Continue to assess and modify interventions until short term weight is achieved;Long Term: Adherence to nutrition and physical activity/exercise program aimed toward attainment of established weight goal;Weight Gain: Understanding of general recommendations for a high calorie, high protein meal plan that promotes weight gain by distributing calorie intake throughout the day with the consumption for 4-5 meals, snacks, and/or supplements;Understanding recommendations for meals to include 15-35% energy as protein, 25-35% energy from fat, 35-60% energy  from carbohydrates, less than 200mg  of dietary cholesterol, 20-35 gm of total fiber daily;Understanding of distribution of calorie intake throughout the day with the consumption of 4-5 meals/snacks    Diabetes Yes    Intervention Provide education about signs/symptoms and action to take for hypo/hyperglycemia.;Provide education about proper nutrition, including hydration, and aerobic/resistive exercise prescription along with prescribed medications to achieve blood glucose in normal ranges: Fasting glucose 65-99 mg/dL    Expected Outcomes Short Term: Participant verbalizes understanding of the signs/symptoms and immediate care of hyper/hypoglycemia, proper foot care and importance of medication, aerobic/resistive exercise and nutrition plan for blood glucose control.;Long Term: Attainment of HbA1C < 7%.    Heart Failure Yes    Intervention Provide a combined exercise and nutrition program  that is supplemented with education, support and counseling about heart failure. Directed toward relieving symptoms such as shortness of breath, decreased exercise tolerance, and extremity edema.    Expected Outcomes Improve functional capacity of life;Short term: Attendance in program 2-3 days a week with increased exercise capacity. Reported lower sodium intake. Reported increased fruit and vegetable intake. Reports medication compliance.;Short term: Daily weights obtained and reported for increase. Utilizing diuretic protocols set by physician.;Long term: Adoption of self-care skills and reduction of barriers for early signs and symptoms recognition and intervention leading to self-care maintenance.    Hypertension Yes    Intervention Provide education on lifestyle modifcations including regular physical activity/exercise, weight management, moderate sodium restriction and increased consumption of fresh fruit, vegetables, and low fat dairy, alcohol moderation, and smoking cessation.;Monitor prescription use compliance.     Expected Outcomes Short Term: Continued assessment and intervention until BP is < 140/17mm HG in hypertensive participants. < 130/64mm HG in hypertensive participants with diabetes, heart failure or chronic kidney disease.;Long Term: Maintenance of blood pressure at goal levels.             Education:Diabetes - Individual verbal and written instruction to review signs/symptoms of diabetes, desired ranges of glucose level fasting, after meals and with exercise. Acknowledge that pre and post exercise glucose checks will be done for 3 sessions at entry of program. Flowsheet Row Cardiac Rehab from 01/04/2023 in [facility]  Date 01/04/23  Educator MB  Instruction Review Code 1- Verbalizes Understanding       Core Components/Risk Factors/Patient Goals Review:    Core Components/Risk Factors/Patient Goals at Discharge (Final Review):    ITP Comments:  ITP Comments     Row Name 12/27/22 1447 01/04/23 1639         ITP Comments Virtual orientation call completed today. shehas an appointment on Date: 01/04/2023  for EP eval and gym Orientation.  Documentation of diagnosis can be found in [facility] 12/12/2022 . Completed and gym orientation. Initial ITP created and sent for review to Bethann Punches, Medical Director.               Comments: Initial ITP

## 2023-01-04 NOTE — Patient Instructions (Signed)
Patient Instructions  Patient Details  Name: Whitney Knight MRN: [id number] Date of Birth: 21-Nov-1950 Referring Provider:  Glori Luis, MD  Below are your personal goals for exercise, nutrition, and risk factors. Our goal is to help you stay on track towards obtaining and maintaining these goals. We will be discussing your progress on these goals with you throughout the program.  Initial Exercise Prescription:  Initial Exercise Prescription - 01/04/23 1600       Date of Initial Exercise RX and Referring Provider   Date 01/04/23    Referring Provider Dr. Herbie Baltimore      Oxygen   Maintain Oxygen Saturation 88% or higher      Treadmill   MPH 2.6    Grade 0    Minutes 15    METs 3      Recumbant Bike   Level 3    RPM 50    Watts 22    Minutes 15    METs 3      NuStep   Level 3    SPM 80    Minutes 15    METs 3      Prescription Details   Frequency (times per week) 2    Duration Progress to 30 minutes of continuous aerobic without signs/symptoms of physical distress      Intensity   THRR 40-80% of Max Heartrate 118-139    Ratings of Perceived Exertion 11-13    Perceived Dyspnea 0-4      Progression   Progression Continue to progress workloads to maintain intensity without signs/symptoms of physical distress.      Resistance Training   Training Prescription Yes    Weight 2 lb    Reps 10-15             Exercise Goals: Frequency: Be able to perform aerobic exercise two to three times per week in program working toward 2-5 days per week of home exercise.  Intensity: Work with a perceived exertion of 11 (fairly light) - 15 (hard) while following your exercise prescription.  We will make changes to your prescription with you as you progress through the program.   Duration: Be able to do 30 to 45 minutes of continuous aerobic exercise in addition to a 5 minute warm-up and a 5 minute cool-down routine.   Nutrition Goals: Your personal nutrition goals will  be established when you do your nutrition analysis with the dietician.  The following are general nutrition guidelines to follow: Cholesterol < 200mg /day Sodium < 1500mg /day Fiber: Women over 50 yrs - 21 grams per day  Personal Goals:  Personal Goals and Risk Factors at Admission - 01/04/23 1648       Core Components/Risk Factors/Patient Goals on Admission    Weight Management Yes;Weight Gain    Intervention Weight Management: Develop a combined nutrition and exercise program designed to reach desired caloric intake, while maintaining appropriate intake of nutrient and fiber, sodium and fats, and appropriate energy expenditure required for the weight goal.;Weight Management: Provide education and appropriate resources to help participant work on and attain dietary goals.;Weight Management/Obesity: Establish reasonable short term and long term weight goals.    Admit Weight 134 lb 11.2 oz (61.1 kg)    Goal Weight: Short Term 140 lb (63.5 kg)    Goal Weight: Long Term 140 lb (63.5 kg)    Expected Outcomes Short Term: Continue to assess and modify interventions until short term weight is achieved;Long Term: Adherence to nutrition and physical activity/exercise  program aimed toward attainment of established weight goal;Weight Gain: Understanding of general recommendations for a high calorie, high protein meal plan that promotes weight gain by distributing calorie intake throughout the day with the consumption for 4-5 meals, snacks, and/or supplements;Understanding recommendations for meals to include 15-35% energy as protein, 25-35% energy from fat, 35-60% energy from carbohydrates, less than 200mg  of dietary cholesterol, 20-35 gm of total fiber daily;Understanding of distribution of calorie intake throughout the day with the consumption of 4-5 meals/snacks    Diabetes Yes    Intervention Provide education about signs/symptoms and action to take for hypo/hyperglycemia.;Provide education about proper  nutrition, including hydration, and aerobic/resistive exercise prescription along with prescribed medications to achieve blood glucose in normal ranges: Fasting glucose 65-99 mg/dL    Expected Outcomes Short Term: Participant verbalizes understanding of the signs/symptoms and immediate care of hyper/hypoglycemia, proper foot care and importance of medication, aerobic/resistive exercise and nutrition plan for blood glucose control.;Long Term: Attainment of HbA1C < 7%.    Heart Failure Yes    Intervention Provide a combined exercise and nutrition program that is supplemented with education, support and counseling about heart failure. Directed toward relieving symptoms such as shortness of breath, decreased exercise tolerance, and extremity edema.    Expected Outcomes Improve functional capacity of life;Short term: Attendance in program 2-3 days a week with increased exercise capacity. Reported lower sodium intake. Reported increased fruit and vegetable intake. Reports medication compliance.;Short term: Daily weights obtained and reported for increase. Utilizing diuretic protocols set by physician.;Long term: Adoption of self-care skills and reduction of barriers for early signs and symptoms recognition and intervention leading to self-care maintenance.    Hypertension Yes    Intervention Provide education on lifestyle modifcations including regular physical activity/exercise, weight management, moderate sodium restriction and increased consumption of fresh fruit, vegetables, and low fat dairy, alcohol moderation, and smoking cessation.;Monitor prescription use compliance.    Expected Outcomes Short Term: Continued assessment and intervention until BP is < 140/29mm HG in hypertensive participants. < 130/88mm HG in hypertensive participants with diabetes, heart failure or chronic kidney disease.;Long Term: Maintenance of blood pressure at goal levels.             Tobacco Use Initial Evaluation: Social  History   Tobacco Use  Smoking Status Never  Smokeless Tobacco Never    Exercise Goals and Review:  Exercise Goals     Row Name 01/04/23 1645             Exercise Goals   Increase Physical Activity Yes       Intervention Provide advice, education, support and counseling about physical activity/exercise needs.;Develop an individualized exercise prescription for aerobic and resistive training based on initial evaluation findings, risk stratification, comorbidities and participant's personal goals.       Expected Outcomes Short Term: Attend rehab on a regular basis to increase amount of physical activity.;Long Term: Exercising regularly at least 3-5 days a week.;Long Term: Add in home exercise to make exercise part of routine and to increase amount of physical activity.       Increase Strength and Stamina Yes       Intervention Provide advice, education, support and counseling about physical activity/exercise needs.;Develop an individualized exercise prescription for aerobic and resistive training based on initial evaluation findings, risk stratification, comorbidities and participant's personal goals.       Expected Outcomes Short Term: Increase workloads from initial exercise prescription for resistance, speed, and METs.;Short Term: Perform resistance training exercises routinely during rehab  and add in resistance training at home;Long Term: Improve cardiorespiratory fitness, muscular endurance and strength as measured by increased METs and functional capacity ( )       Able to understand and use rate of perceived exertion (RPE) scale Yes       Intervention Provide education and explanation on how to use RPE scale       Expected Outcomes Short Term: Able to use RPE daily in rehab to express subjective intensity level;Long Term:  Able to use RPE to guide intensity level when exercising independently       Able to understand and use Dyspnea scale Yes       Intervention Provide education  and explanation on how to use Dyspnea scale       Expected Outcomes Short Term: Able to use Dyspnea scale daily in rehab to express subjective sense of shortness of breath during exertion;Long Term: Able to use Dyspnea scale to guide intensity level when exercising independently       Knowledge and understanding of Target Heart Rate Range (THRR) Yes       Intervention Provide education and explanation of THRR including how the numbers were predicted and where they are located for reference       Expected Outcomes Short Term: Able to state/look up THRR;Long Term: Able to use THRR to govern intensity when exercising independently;Short Term: Able to use daily as guideline for intensity in rehab       Able to check pulse independently Yes       Intervention Provide education and demonstration on how to check pulse in carotid and radial arteries.;Review the importance of being able to check your own pulse for safety during independent exercise       Expected Outcomes Short Term: Able to explain why pulse checking is important during independent exercise;Long Term: Able to check pulse independently and accurately       Understanding of Exercise Prescription Yes       Intervention Provide education, explanation, and written materials on patient's individual exercise prescription       Expected Outcomes Short Term: Able to explain program exercise prescription;Long Term: Able to explain home exercise prescription to exercise independently                Copy of goals given to participant.

## 2023-01-05 NOTE — Progress Notes (Signed)
Advanced Heart Failure Clinic Note  PCP: Glori Luis, MD PCP-Cardiologist: Yvonne Kendall, MD HF-Cardiologist: Arvilla Meres, MD  HPI:  Whitney Knight is a 72 y.o. female with history of CAD with a history of late presenting anterior ST elevation MI, HFrEF secondary to ICM, DM2, HTN, HLD, and hypothyroidism. Mother died at 45 from SCD and father died at 23 with h/o CHF and COPD.   Has long h/o DM2. Retired from [employer] 2 years ago and lost nearly 50 pounds with walking program.   She was admitted to [facility] with a late presenting anterior ST elevation MI and acute HFrEF 12/12/22.  Initially, concerns were for myocarditis given multiple sick contacts.  Troponin peaked at 2024.  BNP 1663.  COVID/respiratory panel negative.  Echo EF of 30-35%, WMA, G2DD, normal RV systolic function, ventricular cavity size, and PASP, mildly dilated left atrium, moderate pleural effusion in the left and right lateral regions, mild to moderate mitral regurgitation, moderate tricuspid regurgitation, and an estimated right atrial pressure of 3 mmHg.  R/LHC showed an occluded proximal to mid LAD which was treated with PCI/DES (overall difficult procedure).  Otherwise, no significant disease in a somewhat codominant system with a wraparound LAD providing the distal half to one third of PDA territory.  Hemodynamic findings were consistent with mild pulmonary hypertension.  She had symptomatic improvement with PCI, IV diuresis, and escalation of GDMT.  Discharged home with LifeVest.   Presented to AHF clinic on 12/27/22 feeling much better. Walking a mile a day (1/2 mile at a time). No CP or SOB. Compliant with meds. Wearing LifeVest. No alarms. Taking lasix 20 daily. Refused losartan due to cough. Candesartan 2 mg daily was started and furosemide was changed to prn.  On 01/04/23, cardiology reviewed BP log and held candesartan for hypotension (SBP 80-90s).    Today [name] returns to Heart Failure Clinic for  pharmacist medication titration. Reports feeling excellent overall. Denies dizziness, lightheadedness, fatigue, chest pain, palpitations, SOB, LEE, orthopnea, orthostasis, and PND. Reports being able to complete all activities of daily living (ADLs). Is very active throughout the day. Weight at home is 130 pounds. Takes furosemide as needed. Appetite is fair. Does follow a low sodium diet. Reports having trouble finding Mounjarno 12.5 mg dose.  Current HF Medications: Carvedilol 3.125 mg BID Candesartan 2 mg daily (currently being held) [place] 10 mg daily Spironolactone 12.5 mg daily  Has the patient been experiencing any side effects to the medications prescribed? Yes. Reported extreme fatigue after starting candesartan and BPs in the 90s. Fatigue resolved with stopping.  Does the patient have any problems obtaining medications due to transportation or finances? No  Understanding of regimen: Excellent Understanding of indications: Excellent Potential of adherence: Excellent  Patient understands to avoid NSAIDs. Patient understands to avoid decongestants.  Pertinent Lab Values: Creatinine, Ser  Date Value Ref Range Status  12/27/2022 1.12 (H) 0.57 - 1.00 mg/dL Final   BUN  Date Value Ref Range Status  12/27/2022 40 (H) 8 - 27 mg/dL Final   Potassium  Date Value Ref Range Status  12/27/2022 4.5 3.5 - 5.2 mmol/L Final   Sodium  Date Value Ref Range Status  12/27/2022 136 134 - 144 mmol/L Final   BNP  Date Value Ref Range Status  12/27/2022 423.5 (H) 0.0 - 100.0 pg/mL Final    Comment:    [employer] ADVIA Centaur XP methodology   Magnesium  Date Value Ref Range Status  12/27/2022 2.4 (H) 1.6 - 2.3  mg/dL Final   TSH  Date Value Ref Range Status  12/12/2022 2.723 0.350 - 4.500 uIU/mL Final    Comment:    Performed by a 3rd Generation assay with a functional sensitivity of <=0.01 uIU/mL. Performed at [facility], [address] [place], [place] [address]    05/18/2022 2.01 0.35 - 5.50 uIU/mL Final   Vital Signs: There were no vitals filed for this visit.  Home BP Readings: Before taking candesartan: 108/72 mmHg 2.5 hours after taking candesartan 101/50 mmHg ~8 hours after taking candesartan: 98/64 mmHg BP over that last week has generally 94-107/60-70s mmHg  Assessment/Plan: 1. Acute systolic HF - Due to iCM - OOH anterior MI 8/24 -> now s/p PCI/DES to LAD - Echo 11/2022 EF of 30 to 35%, WMA, G2DD - NYHA II - Volume status ok today with no evidence of edema or hypovolemia. - Continue lasix PRN. Has not been requiring. - Continue spirolactone 12.5 mg daily - Continue Farxiga 10 mg daily - Increase carvedilol to 6.25 mg BID. Currently asymptomatic and euvolemic. HR at home in 80s and 90 in clinic. Goal 70 bpm. Instructed to reach out with any symptoms of hypotension. - Continue to hold candesartan for now given symptomatic hypotension. Can attempt again at next visit as BP tolerates  - Continue Lifevest for now - Repeat echo in 2 months to decide on ICD - Since patient has not been taking candesartan, no need for labs today.   2. CAD - s/p OOH anterior MI 8/24 -> now s/p PCI/DES to LAD. No other CAD - Continue DAPT. No longer taking red yeast rice. Intolerant to statins. - Management per primary cardiology - Has referral to CR   3. DM2 - On SGLT2i and GLP1-RA -Mounjaro dose recently reduced to 12.5 mg weekly. Requested refill, which was sent to [facility] pharmacy.  Follow up: 1 month with Dr. Gala Romney  Thank you for involving pharmacy in this patient's care.  Enos Fling, PharmD, BCPS Phone - [phone] Clinical Pharmacist 01/11/2023 2:10 PM

## 2023-01-06 ENCOUNTER — Other Ambulatory Visit: Payer: Self-pay | Admitting: Family Medicine

## 2023-01-08 ENCOUNTER — Encounter: Payer: Medicare Other | Admitting: *Deleted

## 2023-01-08 ENCOUNTER — Other Ambulatory Visit: Payer: Self-pay | Admitting: Family Medicine

## 2023-01-08 DIAGNOSIS — Z955 Presence of coronary angioplasty implant and graft: Secondary | ICD-10-CM | POA: Diagnosis not present

## 2023-01-08 DIAGNOSIS — Z48812 Encounter for surgical aftercare following surgery on the circulatory system: Secondary | ICD-10-CM | POA: Diagnosis not present

## 2023-01-08 DIAGNOSIS — I252 Old myocardial infarction: Secondary | ICD-10-CM | POA: Diagnosis not present

## 2023-01-08 DIAGNOSIS — I214 Non-ST elevation (NSTEMI) myocardial infarction: Secondary | ICD-10-CM

## 2023-01-08 LAB — GLUCOSE, CAPILLARY
Glucose-Capillary: 138 mg/dL — ABNORMAL HIGH (ref 70–99)
Glucose-Capillary: 93 mg/dL (ref 70–99)
Glucose-Capillary: 147 mg/dL — ABNORMAL HIGH (ref 70–99)

## 2023-01-08 NOTE — Progress Notes (Signed)
Daily Session Note  Patient Details  Name: Whitney Knight MRN: [id number] Date of Birth: 1950/06/29 Referring Provider:   Flowsheet Row Cardiac Rehab from 01/04/2023 in [facility] Cardiac [facility] Pulmonary Rehab  Referring Provider Dr. Herbie Baltimore       Encounter Date: 01/08/2023  Check In:  Session Check In - 01/08/23 1108       Check-In   Supervising physician immediately available to respond to emergencies See telemetry face sheet for immediately available ER MD    Location [facility]-Cardiac & Pulmonary Rehab    Staff Present Cora Collum, RN, BSN, Matthew Saras, BS, ACSM CEP, Exercise Physiologist;Maxon Conetta BS, , Exercise Physiologist    Virtual Visit No    Medication changes reported     No    Fall or balance concerns reported    No    Warm-up and Cool-down Performed on first and last piece of equipment    Resistance Training Performed Yes    VAD Patient? No    PAD/SET Patient? No      Pain Assessment   Currently in Pain? No/denies                Social History   Tobacco Use  Smoking Status Never  Smokeless Tobacco Never    Goals Met:  Exercise tolerated well Personal goals reviewed No report of concerns or symptoms today  Goals Unmet:  Not Applicable  Comments: First full day of exercise!  Patient was oriented to gym and equipment including functions, settings, policies, and procedures.  Patient's individual exercise prescription and treatment plan were reviewed.  All starting workloads were established based on the results of the 6 minute walk test done at initial orientation visit.  The plan for exercise progression was also introduced and progression will be customized based on patient's performance and goals.    Dr. Bethann Punches is Medical Director for [facility] Cardiac Rehabilitation.  Dr. Vida Rigger is Medical Director for [facility] Pulmonary Rehabilitation.

## 2023-01-08 NOTE — Progress Notes (Signed)
Assessment start time: 10:53 AM  Accessibility to food: no concerns Digestive issues/concerns: no known food allergies  24-hours Recall: B: Alini new protein coffee Snack: lite and lively yogurt, swiss cheese stick L: health shake Snack: yogurt or cheese cracker D: chicken, salad Snack: some sort of protein snack   Beverages water, zevia Alcohol: none Caffeine zevia, and protien coffee  Supplements health code nutrition shake, electrolytes  Intake Patterns Gets full quickly, usually has smaller snacks and meal replacement shake to help meet calorie needs.   Education r/t nutrition plan: Patient drinks mostly water, sometimes zevia. She reports she gets full quickly, uses small snacks and meal replacement shake often to help her get enough nutrition . She doesn't want to lose weight, feels she could gain some weight even. Encouraged her to continue to eat smaller but more frequent meals, focus in nutrient dense foods over empty calories. She has T2DM, A1C improving, currently at 7.0%. Spoke to her about carb sources, portions, and goal of eating ~30-60g per meal, ~15-30g per snack. Encouraged fibrous, complex carbs like whole grains, legumes, and lentils. Reviewed mediterranean diet handout, types of fats, their sources, how to read label. Her meal replacement was 400kcal, 27gFat, 17gSat fat, 13gCHO, 9gFiber, 27gPro. Spoke to her about how the protein content was good, low in sodium and good calorie count, but that the high saturated fat intake was on concern, than looking for lower sat fat alternatives would be a good idea. She seemed to prefer this meal replacement over trying others at this time. Encouraged her to watch saturated fat intake outside of this meal replacement as a goal to prevent excessive sat fat intake. Also recommended she add a fruit to it as well, since it was low in carbs and almost all of it was fiber.    Goal 1: Use healthy fats to promote heart health and support  calorie intake. Goal 2: Eat a protein and a controlled portion of carbs at each meal  Goal 3: Watch saturated fat intake, aim for less than ~15g per day on average  End time 11:43 AM

## 2023-01-08 NOTE — Telephone Encounter (Signed)
She is no longer on this medicine per hospital discharge summary and her most recent heart failure clinic note.  Please see if she has still been taking this.  Thanks.

## 2023-01-09 ENCOUNTER — Encounter: Payer: Self-pay | Admitting: Family Medicine

## 2023-01-09 ENCOUNTER — Ambulatory Visit: Payer: Medicare Other

## 2023-01-09 NOTE — Telephone Encounter (Signed)
Patient states she will have to check with her daughter cause she is not sure.

## 2023-01-09 NOTE — Telephone Encounter (Signed)
Patient sent a mychart message and states she is no longer on this medication to refuse the refill.

## 2023-01-09 NOTE — Telephone Encounter (Signed)
Noted  

## 2023-01-10 ENCOUNTER — Encounter: Payer: Medicare Other | Admitting: *Deleted

## 2023-01-10 ENCOUNTER — Ambulatory Visit: Payer: Medicare Other

## 2023-01-10 DIAGNOSIS — Z955 Presence of coronary angioplasty implant and graft: Secondary | ICD-10-CM

## 2023-01-10 DIAGNOSIS — Z48812 Encounter for surgical aftercare following surgery on the circulatory system: Secondary | ICD-10-CM | POA: Diagnosis not present

## 2023-01-10 DIAGNOSIS — I214 Non-ST elevation (NSTEMI) myocardial infarction: Secondary | ICD-10-CM

## 2023-01-10 DIAGNOSIS — I252 Old myocardial infarction: Secondary | ICD-10-CM | POA: Diagnosis not present

## 2023-01-10 LAB — GLUCOSE, CAPILLARY
Glucose-Capillary: 126 mg/dL — ABNORMAL HIGH (ref 70–99)
Glucose-Capillary: 93 mg/dL (ref 70–99)

## 2023-01-10 NOTE — Progress Notes (Signed)
Daily Session Note  Patient Details  Name: Whitney Knight MRN: [id number] Date of Birth: 16-Sep-1950 Referring Provider:   Flowsheet Row Cardiac Rehab from 01/04/2023 in [facility] Cardiac and Pulmonary Rehab  Referring Provider Dr. Herbie Baltimore       Encounter Date: 01/10/2023  Check In:  Session Check In - 01/10/23 1009       Check-In   Supervising physician immediately available to respond to emergencies See telemetry face sheet for immediately available ER MD    Location [facility]-Cardiac & Pulmonary Rehab    Staff Present Rory Percy, MS, Exercise Physiologist;Joseph Shelbie Proctor, RN, ADN    Virtual Visit No    Medication changes reported     No    Fall or balance concerns reported    No    Warm-up and Cool-down Performed on first and last piece of equipment    Resistance Training Performed Yes    VAD Patient? No    PAD/SET Patient? No      Pain Assessment   Currently in Pain? No/denies                Social History   Tobacco Use  Smoking Status Never  Smokeless Tobacco Never    Goals Met:  Independence with exercise equipment Exercise tolerated well No report of concerns or symptoms today Strength training completed today  Goals Unmet:  Not Applicable  Comments: Pt able to follow exercise prescription today without complaint.  Will continue to monitor for progression.    Dr. Bethann Punches is Medical Director for [facility] Cardiac Rehabilitation.  Dr. Vida Rigger is Medical Director for [facility] Pulmonary Rehabilitation.

## 2023-01-11 ENCOUNTER — Ambulatory Visit: Payer: Medicare Other | Attending: Internal Medicine | Admitting: Pharmacist

## 2023-01-11 ENCOUNTER — Other Ambulatory Visit: Payer: Self-pay

## 2023-01-11 ENCOUNTER — Encounter: Payer: Self-pay | Admitting: Pharmacist

## 2023-01-11 ENCOUNTER — Ambulatory Visit: Payer: Medicare Other

## 2023-01-11 DIAGNOSIS — E1165 Type 2 diabetes mellitus with hyperglycemia: Secondary | ICD-10-CM

## 2023-01-11 MED ORDER — TIRZEPATIDE 12.5 MG/0.5ML ~~LOC~~ SOAJ
12.5000 mg | SUBCUTANEOUS | 2 refills | Status: DC
  Filled 2023-01-11: qty 2, 28d supply, fill #0
  Filled 2023-01-11: qty 6, 84d supply, fill #0
  Filled 2023-02-01: qty 2, 28d supply, fill #0
  Filled 2023-02-24: qty 2, 28d supply, fill #1
  Filled 2023-03-24: qty 2, 28d supply, fill #2
  Filled 2023-04-21: qty 2, 28d supply, fill #3
  Filled 2023-05-17: qty 2, 28d supply, fill #4
  Filled 2023-06-16: qty 2, 28d supply, fill #5
  Filled 2023-07-11: qty 2, 28d supply, fill #6
  Filled 2023-08-08: qty 2, 28d supply, fill #7
  Filled 2023-09-05: qty 2, 28d supply, fill #8

## 2023-01-11 MED ORDER — CARVEDILOL 6.25 MG PO TABS: 3.1250 mg | ORAL_TABLET | Freq: Two times a day (BID) | ORAL | 3 refills | Status: DC

## 2023-01-11 NOTE — Patient Instructions (Signed)
It was a pleasure seeing you today!  MEDICATIONS: -We are changing your medications today -Increase carvedilol to 6.25 mg twice daily (can double current 3.125 mg dose to test tolerability) -Stop taking candesartan for now. Can try again in ~1 month -Pick up Mounjaro 12.5 mg weekly at the pharmacy -Call if you have questions about your medications.  LABS: -We will call you if your labs need attention.  NEXT APPOINTMENT: Return to clinic in 1 month with Dr. Gala Romney.  In general, to take care of your heart failure: -Limit your fluid intake to 2 Liters (half-gallon) per day.   -Limit your salt intake to ideally 2-3 grams (2000-3000 mg) per day. -Weigh yourself daily and record, and bring that "weight diary" to your next appointment.  (Weight gain of 2-3 pounds in 1 day typically means fluid weight.) -The medications for your heart are to help your heart and help you live longer.   -Please contact us before stopping any of your heart medications.  Call the clinic at [phone] with questions or to reschedule future appointments.

## 2023-01-16 ENCOUNTER — Ambulatory Visit: Payer: Medicare Other

## 2023-01-16 DIAGNOSIS — I420 Dilated cardiomyopathy: Secondary | ICD-10-CM | POA: Diagnosis not present

## 2023-01-16 DIAGNOSIS — I214 Non-ST elevation (NSTEMI) myocardial infarction: Secondary | ICD-10-CM | POA: Diagnosis not present

## 2023-01-18 ENCOUNTER — Ambulatory Visit: Payer: Medicare Other

## 2023-01-19 NOTE — Progress Notes (Unsigned)
Cardiology Office Note    Date:  01/22/2023   ID:  Whitney Knight, DOB Sep 13, 1950, MRN [id number]  PCP:  Glori Luis, MD  Cardiologist:  Yvonne Kendall, MD  Electrophysiologist:  None  Advanced Heart Failure: Bensimhon, MD  Chief Complaint: Follow up  History of Present Illness:   Whitney Knight is a 72 y.o. female with history of CAD with late presenting anterior ST elevation MI, HFrEF secondary to ICM, DM2, HTN, HLD, and hypothyroidism who presents for follow up of CAD and cardiomyopathy.   Calcium score in 2022 of 37.1 which was the 64th percentile.  She was admitted to [facility] in 11/2022 with a late presenting anterior ST elevation MI and acute HFrEF.  Initially, concerns were for myocarditis given multiple sick contacts.  Troponin peaked at 2024.  BNP 1663.  COVID/respiratory panel negative.  Echo demonstrated an EF of 30 to 35%, WMA, G2DD, normal RV systolic function, ventricular cavity size, and PASP, mildly dilated left atrium, moderate pleural effusion in the left and right lateral regions, mild to moderate mitral regurgitation, moderate tricuspid regurgitation, and an estimated right atrial pressure of 3 mmHg.  R/LHC showed an occluded proximal to mid LAD which was treated with PCI/DES (overall difficult procedure).  Otherwise, no significant disease in a somewhat codominant system with a wraparound LAD providing the distal half to one third of PDA territory.  Hemodynamic findings were consistent with mild pulmonary hypertension.  She had symptomatic improvement with PCI, IV diuresis, and escalation of GDMT.  Discharged home with LifeVest.  Has preferred red yeast rice and CoQ10 over statin.  Relative hypotension has precluded escalation of GDMT.  She was evaluated by the advanced heart failure service and initiated on candesartan which was subsequently discontinued with hypotension into the 80s systolic.  Furosemide has been transitioned to as needed.  She comes in accompanied  by her husband today and is doing very well from a cardiac perspective.  No symptoms of angina or cardiac decompensation.  No dyspnea, palpitations, dizziness, presyncope, or syncope.  She has had intermittent issues with lead connectivity with a LifeVest, though no shocks.  Continues to advance activity as tolerated, recently ambulated 2 miles over a couple different sessions.  Is having issues with seasonal allergies.  No falls or symptoms concerning for bleeding.  Appetite is intermittent.  Attempted to titrate carvedilol to 6.25 mg twice daily, with this blood pressure was soft at 98/58.  Given this, she resumed carvedilol 3.125 mg twice daily with continuation of Farxiga 10 mg, and spironolactone 12.5 mg blood pressure stable predominantly in the 100s over 60s.  Adherent to DAPT without any missed doses.   Labs independently reviewed: 11/2022 - BNP 423, Hgb 14.4, PLT 209, BUN 40, serum creatinine 1.12, potassium 4.5, magnesium 2.4 11/2022 - LP(a) 48.3, Hgb 13.8, PLT 299, TC 141, TG 91, HDL 36, LDL 87, TSH normal 07/2022 - A1c 7.0 05/2022 - albumin 4.2, AST/ALT normal  Past Medical History:  Diagnosis Date   Allergy    Anemia    Arthritis    KNEES   Cataract    Chronic sinusitis    Cough    Diabetes mellitus    Diverticulosis    GERD (gastroesophageal reflux disease)    Headache(784.0)    [facility] in past   Hypertension    Osteoporosis    Thyroid disease     Past Surgical History:  Procedure Laterality Date   bone graft     for dental  surgery   CARPAL TUNNEL RELEASE Right 02/27/2013   [facility]   COLONOSCOPY  2014   CORONARY STENT INTERVENTION N/A 12/14/2022   Procedure: CORONARY STENT INTERVENTION;  Surgeon: Marykay Lex, MD;  Location: [facility] INVASIVE CV LAB;  Service: Cardiovascular;  Laterality: N/A;   dental implants     DENTAL SURGERY     DENTAL SURGERY     dental implant   NASAL SINUS SURGERY  06/2008   Dr. Jenne Campus   RIGHT/LEFT HEART CATH AND CORONARY  ANGIOGRAPHY N/A 12/14/2022   Procedure: RIGHT/LEFT HEART CATH AND CORONARY ANGIOGRAPHY;  Surgeon: Marykay Lex, MD;  Location: [facility] INVASIVE CV LAB;  Service: Cardiovascular;  Laterality: N/A;   TRIGGER FINGER RELEASE Right 02/27/2013   [facility]   UPPER GASTROINTESTINAL ENDOSCOPY     VAGINAL DELIVERY     2    Current Medications: Current Meds  Medication Sig   aspirin EC 81 MG tablet Take 1 tablet (81 mg total) by mouth daily. Swallow whole.   blood glucose meter kit and supplies KIT One touch ultra mini. Use 2-4 times per day.   carvedilol (COREG) 6.25 MG tablet Take 0.5 tablets (3.125 mg total) by mouth 2 (two) times daily with a meal.   cetirizine (ZYRTEC) 10 MG chewable tablet Chew 10 mg by mouth daily.   Continuous Glucose Receiver (DEXCOM G7 RECEIVER) DEVI Use to check glucose at least every 8 hours.   Continuous Glucose Sensor (DEXCOM G7 SENSOR) MISC Apply every 10 days   dapagliflozin propanediol (FARXIGA) 10 MG TABS tablet Take 1 tablet (10 mg total) by mouth daily.   fluticasone (FLONASE) 50 MCG/ACT nasal spray USE TWO SPRAYS IN EACH NOSTRIL DAILY   Lancets (ONETOUCH DELICA PLUS LANCET33G) MISC USE ONCE DAILY AS DIRECTED   levOCARNitine (L-CARNITINE PO) Take 2 tablets by mouth daily at 6 (six) AM.   levothyroxine (SYNTHROID) 75 MCG tablet TAKE 1 TABLET BY MOUTH DAILY   ONETOUCH ULTRA test strip USE TO CHECK BLOOD SUGAR UP TO THREE TIMES DAILY   saccharomyces boulardii (FLORASTOR) 250 MG capsule Take 1 capsule (250 mg total) by mouth daily.   spironolactone (ALDACTONE) 25 MG tablet Take 0.5 tablets (12.5 mg total) by mouth daily.   ticagrelor (BRILINTA) 90 MG TABS tablet Take 1 tablet (90 mg total) by mouth 2 (two) times daily.   tirzepatide (MOUNJARO) 12.5 MG/0.5ML Pen Inject 12.5 mg into the skin once a week.    Allergies:   Clarithromycin, Jardiance [empagliflozin], Penicillins, and Prednisone   Social History   Socioeconomic History   Marital status: Married    Spouse  name: Not on file   Number of children: 2   Years of education: Not on file   Highest education level: Not on file  Occupational History   Occupation: [employer]    Employer: [employer]  Tobacco Use   Smoking status: Never   Smokeless tobacco: Never  Vaping Use   Vaping status: Never Used  Substance and Sexual Activity   Alcohol use: Yes    Comment: occ   Drug use: No   Sexual activity: Not on file  Other Topics Concern   Not on file  Social History Narrative   Lives with husband. No pets, 2 children.      Work - [employer]      Diet - regular   Exercise - none presently   Social Determinants of Health   Financial Resource Strain: Patient Declined (12/12/2022)   Overall Financial Resource Strain (CARDIA)  Difficulty of Paying Living Expenses: Patient declined  Food Insecurity: No Food Insecurity (12/18/2022)   Hunger Vital Sign    Worried About Running Out of Food in the Last Year: Never true    Ran Out of Food in the Last Year: Never true  Transportation Needs: No Transportation Needs (12/18/2022)   PRAPARE - Administrator, Civil Service (Medical): No    Lack of Transportation (Non-Medical): No  Physical Activity: Sufficiently Active (12/12/2022)   Exercise Vital Sign    Days of Exercise per Week: 7 days    Minutes of Exercise per Session: 30 min  Stress: No Stress Concern Present (12/12/2022)   [employer] of Occupational Health - Occupational Stress Questionnaire    Feeling of Stress : Not at all  Social Connections: Unknown (12/12/2022)   Social Connection and Isolation Panel [NHANES]    Frequency of Communication with Friends and Family: Patient declined    Frequency of Social Gatherings with Friends and Family: Patient declined    Attends Religious Services: Patient declined    Database administrator or Organizations: Patient declined    Attends Engineer, structural: More than 4 times per year    Marital Status: Married      Family History:  The patient's family history includes COPD in her father; Diabetes in her maternal grandmother; Heart disease in her father and mother; Hypertension in her father. There is no history of Colon cancer, Esophageal cancer, Pancreatic cancer, Stomach cancer, or Liver disease.  ROS:   12-point review of systems is negative unless otherwise noted in the HPI.   EKGs/Labs/Other Studies Reviewed:    Studies reviewed were summarized above. The additional studies were reviewed today:  Calcium score 02/24/2021: IMPRESSION AND RECOMMENDATION: 1. Coronary calcium score of 37.1. This was 64th percentile for age and sex matched control. 2. CAC 1-99 in LAD.  CAC-DRS A1/N1. 3. Continue heart healthy lifestyle and risk factor modification. 4. Consider statin therapy due to Age >55. __________   2D echo 12/13/2022: 1. Left ventricular ejection fraction, by estimation, is 30 to 35%. The  left ventricle has moderately decreased function. The left ventricle  demonstrates regional wall motion abnormalities (see scoring  diagram/findings for description). Left ventricular   diastolic parameters are consistent with Grade II diastolic dysfunction  (pseudonormalization). Elevated left atrial pressure. The average left  ventricular global longitudinal strain is -6.7 %. The global longitudinal  strain is abnormal.   2. Right ventricular systolic function is normal. The right ventricular  size is normal. There is normal pulmonary artery systolic pressure.   3. Left atrial size was mildly dilated.   4. Moderate pleural effusion in both left and right lateral regions.   5. The mitral valve is abnormal. Mild to moderate mitral valve  regurgitation. No evidence of mitral stenosis.   6. Tricuspid valve regurgitation is moderate.   7. The aortic valve is tricuspid. Aortic valve regurgitation is not  visualized. No aortic stenosis is present.   8. The inferior vena cava is normal in size with  greater than 50%  respiratory variability, suggesting right atrial pressure of 3 mmHg.   Conclusion(s)/Recommendation(s): No left ventricular mural or apical thrombus/thrombi.  __________   [facility] 12/14/2022:   CULPRIT LESION: Prox LAD to Mid LAD lesion is 100% stenosed with 0% stenosed side branch in 1st Diag.   A drug-eluting stent was successfully placed using a STENT ONYX FRONTIER 2.5X30. => Taper postdilation from 3.1 to 2.8 mm  Post intervention, there is a 0% residual stenosis.  TIMI-3 flow restored.   Post intervention, the side branch was reduced to 0% residual stenosis distally (very small vessel); 1st Diag lesion is 100% stenosed distally.   Hemodynamic findings consistent with mild pulmonary hypertension.   There is no aortic valve stenosis.   Anticipated discharge date to be determined.   Patient still has need for additional titration of CHF/C medications along with additional diuresis   Recommend uninterrupted dual antiplatelet therapy with Aspirin 81mg  daily and Ticagrelor 90mg  twice daily for a minimum of 12 months (ACS-Class I recommendation).   Would continue Thienopyridine monotherapy with either clopidogrel 75 mg daily or ticagrelor 60 mg twice daily long-term based on 30 mm stent in the proximal LAD.   POST-OPERATIVE DIAGNOSIS:   Severe single-vessel disease with 100% occlusion of the prox-mid LAD at SP1 otherwise no significant disease in a somewhat codominant system with the wraparound LAD providing the distal half to one third of the PDA territory. Difficult but successful DES PCI of the LAD with a Resolute Onyx DES 2.5 mm x 30 mm stent postdilated and tapered fashion from 3.1 to 2.8 mm.  2 small diagonal branches noted after stent with 1 small diagonal that barely feels that arose from the original occluded section. Consistent combined CHF with known EF of 30 to 35% and PCWP of 29 millimercury and LVEDP of 21 mL of mercury, but preserved cardiac output put and index with  cardiac output of 5.44 and index of 3.16. RHC numbers: RAP 5 mmHg; RV P-EDP 46/0-8 mmHg; PAP-mean 46/19-32 mmHg (mild to moderate WHO class II-LV CHF related), PCWP 28 mmHg.  LV P-EDP 113/5-21 mmHg; AO P-MAP 104/68-81 mmHg.  Ao sat 90%, PA sat 68%.     PLAN OF CARE:  Patient continues to be inpatient.  I have written for IV Lasix 60 mg to be given today post cath (early PM dose).  Will continue with IV diuresis given elevated LVEDP and PCWP - INCREASE TO 60mg  BID starting tonite.  DAPT for minimum 1 year but would like to continue Thienopyridine for lifetime based on length of stent in the LAD.  Continue to titrate GDMT for CAD and CM/CHF.  Phase 2 CRH ordered.   EKG:  EKG is ordered today.  The EKG ordered today demonstrates NSR, 78 bpm, nonspecific IVCD, prior anterior infarct, lateral T wave inversion, consistent with prior tracing  Recent Labs: 05/18/2022: ALT 31 12/12/2022: Pro B Natriuretic peptide (BNP) 1,663.0; TSH 2.723 12/27/2022: BNP 423.5; BUN 40; Creatinine, Ser 1.12; Hemoglobin 14.4; Magnesium 2.4; Platelets 209; Potassium 4.5; Sodium 136  Recent Lipid Panel    Component Value Date/Time   CHOL 141 12/14/2022 0629   CHOL 202 (H) 04/04/2021 1034   TRIG 91 12/14/2022 0629   HDL 36 (L) 12/14/2022 0629   HDL 72 04/04/2021 1034   CHOLHDL 3.9 12/14/2022 0629   VLDL 18 12/14/2022 0629   LDLCALC 87 12/14/2022 0629   LDLCALC 113 (H) 04/04/2021 1034   LDLDIRECT 118 (H) 10/04/2021 1058    PHYSICAL EXAM:    VS:  BP 112/70 (BP Location: Left Arm, Patient Position: Sitting, Cuff Size: Normal)   Pulse 78   Ht 5\' 4"  (1.626 m)   Wt 132 lb 9.6 oz (60.1 kg)   BMI 22.76 kg/m   BMI: Body mass index is 22.76 kg/m.  Physical Exam Vitals reviewed.  Constitutional:      Appearance: She is well-developed.  HENT:  Head: Normocephalic and atraumatic.  Eyes:     General:        Right eye: No discharge.        Left eye: No discharge.  Neck:     Vascular: No JVD.  Cardiovascular:      Rate and Rhythm: Normal rate and regular rhythm.     Pulses:          Posterior tibial pulses are 2+ on the right side and 2+ on the left side.     Heart sounds: S1 normal and S2 normal. Heart sounds not distant. No midsystolic click and no opening snap. Murmur heard.     Systolic murmur is present with a grade of 2/6 at the lower left sternal border.     No friction rub.  Pulmonary:     Effort: Pulmonary effort is normal. No respiratory distress.     Breath sounds: Normal breath sounds. No decreased breath sounds, wheezing or rales.  Chest:     Chest wall: No tenderness.  Abdominal:     General: There is no distension.  Musculoskeletal:     Cervical back: Normal range of motion.     Right lower leg: No edema.     Left lower leg: No edema.  Skin:    General: Skin is warm and dry.     Nails: There is no clubbing.  Neurological:     Mental Status: She is alert and oriented to person, place, and time.  Psychiatric:        Speech: Speech normal.        Behavior: Behavior normal.        Thought Content: Thought content normal.        Judgment: Judgment normal.     Wt Readings from Last 3 Encounters:  01/22/23 132 lb 9.6 oz (60.1 kg)  01/11/23 135 lb (61.2 kg)  01/04/23 134 lb 11.2 oz (61.1 kg)     ASSESSMENT & PLAN:   CAD involving the native coronary arteries without angina: She continues to do well and is without symptoms concerning for angina.  Continue aggressive risk factor modification and secondary prevention with ASA 81 mg daily and ticagrelor 90 mg twice daily for a minimum of 12 months dating back to date of PCI without interruption (12/14/2022).  She remains on carvedilol.  Historically declined statin and ezetimibe.  Prefers to avoid PCSK9 inhibitor.  Remains on red yeast rice and co-Q10.  Participating with cardiac rehab.  No indication for further ischemic testing at this time.  HFrEF secondary to ICM: Euvolemic and well compensated.  Relative hypotension has precluded  escalation of GDMT and led to discontinuation of ARB.  Remains on carvedilol 3.125 mg twice daily, Farxiga 10 mg, spironolactone 12.5 mg.  Escalate GDMT as/if able.  Check BMP and BNP.  Not requiring a standing diuretic.  LifeVest without shocks.  Plan to repeat echo after 03/16/2023 to evaluate for improvement in her cardiomyopathy following PCI of the LAD and on maximally tolerated GDMT.  Should her cardiomyopathy persist at that time, with an EF less than 35%, would recommend continuation of LifeVest and referral to EP for consideration of ICD.  QRS not currently wide enough for CRT.  HTN: Blood pressure is well-controlled in the office today.  Blood pressure has limited escalation of GDMT.  Continue medical therapy as outlined above.  HLD with statin intolerance: LDL 87 in 11/2022 with target LDL being less than 55.  Statin intolerant.  Remains on red  yeast rice and co-Q10.  DM2: Remains on SGLT2 inhibitor and GLP-1 therapy.  Check A1c.    Disposition: F/u with Dr. Okey Dupre or an APP after echo.   Medication Adjustments/Labs and Tests Ordered: Current medicines are reviewed at length with the patient today.  Concerns regarding medicines are outlined above. Medication changes, Labs and Tests ordered today are summarized above and listed in the Patient Instructions accessible in Encounters.   SignedEula Listen, PA-C 01/22/2023 11:47 AM     [facility] [place] [address] [place], [place] [address] [phone]

## 2023-01-22 ENCOUNTER — Encounter: Payer: Self-pay | Admitting: Physician Assistant

## 2023-01-22 ENCOUNTER — Ambulatory Visit: Payer: Medicare Other | Attending: Physician Assistant | Admitting: Physician Assistant

## 2023-01-22 VITALS — BP 112/70 | HR 78 | Ht 64.0 in | Wt 132.6 lb

## 2023-01-22 DIAGNOSIS — E118 Type 2 diabetes mellitus with unspecified complications: Secondary | ICD-10-CM

## 2023-01-22 DIAGNOSIS — E785 Hyperlipidemia, unspecified: Secondary | ICD-10-CM | POA: Diagnosis not present

## 2023-01-22 DIAGNOSIS — I5020 Unspecified systolic (congestive) heart failure: Secondary | ICD-10-CM

## 2023-01-22 DIAGNOSIS — I255 Ischemic cardiomyopathy: Secondary | ICD-10-CM

## 2023-01-22 DIAGNOSIS — Z79899 Other long term (current) drug therapy: Secondary | ICD-10-CM

## 2023-01-22 DIAGNOSIS — I10 Essential (primary) hypertension: Secondary | ICD-10-CM | POA: Diagnosis not present

## 2023-01-22 DIAGNOSIS — Z789 Other specified health status: Secondary | ICD-10-CM | POA: Diagnosis not present

## 2023-01-22 DIAGNOSIS — I2510 Atherosclerotic heart disease of native coronary artery without angina pectoris: Secondary | ICD-10-CM

## 2023-01-22 NOTE — Patient Instructions (Signed)
Medication Instructions:  Your Physician recommend you continue on your current medication as directed.    *If you need a refill on your cardiac medications before your next appointment, please call your pharmacy*   Lab Work: Your provider would like for you to have following labs drawn today BMT, BNP, and A1C.   If you have labs (blood work) drawn today and your tests are completely normal, you will receive your results only by: MyChart Message (if you have MyChart) OR A paper copy in the mail If you have any lab test that is abnormal or we need to change your treatment, we will call you to review the results.   Testing/Procedures: Your physician has requested that you have an echocardiogram AFTER 11/15. Echocardiography is a painless test that uses sound waves to create images of your heart. It provides your doctor with information about the size and shape of your heart and how well your heart's chambers and valves are working.   You may receive an ultrasound enhancing agent through an IV if needed to better visualize your heart during the echo. This procedure takes approximately one hour.  There are no restrictions for this procedure.  This will take place at [address] [address], [place] [address]    Follow-Up: At [facility], you and your health needs are our priority.  As part of our continuing mission to provide you with exceptional heart care, we have created designated Provider Care Teams.  These Care Teams include your primary Cardiologist (physician) and Advanced Practice Providers (APPs -  Physician Assistants and Nurse Practitioners) who all work together to provide you with the care you need, when you need it.  We recommend signing up for the patient portal called "MyChart".  Sign up information is provided on this After Visit Summary.  MyChart is used to connect with patients for Virtual Visits (Telemedicine).  Patients are able to view  lab/test results, encounter notes, upcoming appointments, etc.  Non-urgent messages can be sent to your provider as well.   To learn more about what you can do with MyChart, go to [email or web address].    Your next appointment:   1 week after Echo on 11/15   Provider:   You may see Yvonne Kendall, MD or one of the following Advanced Practice Providers on your designated Care Team:   Eula Listen, [place]

## 2023-01-23 ENCOUNTER — Ambulatory Visit: Payer: Medicare Other

## 2023-01-23 LAB — BASIC METABOLIC PANEL
BUN/Creatinine Ratio: 22 (ref 12–28)
BUN: 23 mg/dL (ref 8–27)
CO2: 23 mmol/L (ref 20–29)
Calcium: 9.7 mg/dL (ref 8.7–10.3)
Chloride: 100 mmol/L (ref 96–106)
Creatinine, Ser: 1.03 mg/dL — ABNORMAL HIGH (ref 0.57–1.00)
Glucose: 114 mg/dL — ABNORMAL HIGH (ref 70–99)
Potassium: 4.6 mmol/L (ref 3.5–5.2)
Sodium: 138 mmol/L (ref 134–144)
eGFR: 58 mL/min/{1.73_m2} — ABNORMAL LOW (ref >59)

## 2023-01-23 LAB — BRAIN NATRIURETIC PEPTIDE: BNP: 345.5 pg/mL — ABNORMAL HIGH (ref 0.0–100.0)

## 2023-01-23 LAB — HEMOGLOBIN A1C
Est. average glucose Bld gHb Est-mCnc: 143 mg/dL
Hgb A1c MFr Bld: 6.6 % — ABNORMAL HIGH (ref 4.8–5.6)

## 2023-01-24 ENCOUNTER — Encounter: Payer: Medicare Other | Admitting: *Deleted

## 2023-01-24 DIAGNOSIS — Z955 Presence of coronary angioplasty implant and graft: Secondary | ICD-10-CM

## 2023-01-24 DIAGNOSIS — I252 Old myocardial infarction: Secondary | ICD-10-CM | POA: Diagnosis not present

## 2023-01-24 DIAGNOSIS — I214 Non-ST elevation (NSTEMI) myocardial infarction: Secondary | ICD-10-CM

## 2023-01-24 DIAGNOSIS — Z48812 Encounter for surgical aftercare following surgery on the circulatory system: Secondary | ICD-10-CM | POA: Diagnosis not present

## 2023-01-24 NOTE — Progress Notes (Signed)
Daily Session Note  Patient Details  Name: Whitney Knight MRN: [id number] Date of Birth: [birth year] Referring Provider:   Flowsheet Row Cardiac Rehab from 01/04/2023 in [facility] Cardiac and Pulmonary Rehab  Referring Provider Dr. Herbie Baltimore       Encounter Date: 01/24/2023  Check In:  Session Check In - 01/24/23 1000       Check-In   Supervising physician immediately available to respond to emergencies See telemetry face sheet for immediately available ER MD    Location [facility]-Cardiac & Pulmonary Rehab    Staff Present Lanny Hurst, RN, ADN;Laureen Manson Passey, BS, RRT, CPFT;Krista Karleen Hampshire RN, BSN    Virtual Visit No    Medication changes reported     No    Fall or balance concerns reported    No    Warm-up and Cool-down Performed on first and last piece of equipment    Resistance Training Performed Yes    VAD Patient? No    PAD/SET Patient? No      Pain Assessment   Currently in Pain? No/denies                Social History   Tobacco Use  Smoking Status Never  Smokeless Tobacco Never    Goals Met:  Independence with exercise equipment Exercise tolerated well No report of concerns or symptoms today Strength training completed today  Goals Unmet:  Not Applicable  Comments: Pt able to follow exercise prescription today without complaint.  Will continue to monitor for progression.    Dr. Bethann Punches is Medical Director for [facility] Cardiac Rehabilitation.  Dr. Vida Rigger is Medical Director for [facility] Pulmonary Rehabilitation.

## 2023-01-25 ENCOUNTER — Ambulatory Visit: Payer: Medicare Other

## 2023-01-29 ENCOUNTER — Encounter: Payer: Medicare Other | Admitting: *Deleted

## 2023-01-29 DIAGNOSIS — Z48812 Encounter for surgical aftercare following surgery on the circulatory system: Secondary | ICD-10-CM | POA: Diagnosis not present

## 2023-01-29 DIAGNOSIS — Z955 Presence of coronary angioplasty implant and graft: Secondary | ICD-10-CM | POA: Diagnosis not present

## 2023-01-29 DIAGNOSIS — I214 Non-ST elevation (NSTEMI) myocardial infarction: Secondary | ICD-10-CM

## 2023-01-29 DIAGNOSIS — I252 Old myocardial infarction: Secondary | ICD-10-CM | POA: Diagnosis not present

## 2023-01-29 NOTE — Progress Notes (Signed)
Daily Session Note  Patient Details  Name: Whitney Knight MRN: [id number] Date of Birth: 12/17/1950 Referring Provider:   Flowsheet Row Cardiac Rehab from 01/04/2023 in [facility]  Referring Provider Dr. Herbie Baltimore       Encounter Date: 01/29/2023  Check In:  Session Check In - 01/29/23 0952       Check-In   Supervising physician immediately available to respond to emergencies See telemetry face sheet for immediately available ER MD    Location [facility]-Cardiac & Pulmonary Rehab    Staff Present Maxon Conetta BS, , Exercise Physiologist;Kelly Madilyn Fireman, BS, ACSM CEP, Exercise Physiologist;Nyelli Samara Katrinka Blazing, RN, ADN    Virtual Visit No    Medication changes reported     No    Fall or balance concerns reported    No    Warm-up and Cool-down Performed on first and last piece of equipment    Resistance Training Performed Yes    VAD Patient? No    PAD/SET Patient? No      Pain Assessment   Currently in Pain? No/denies                Social History   Tobacco Use  Smoking Status Never  Smokeless Tobacco Never    Goals Met:  Independence with exercise equipment Exercise tolerated well No report of concerns or symptoms today Strength training completed today  Goals Unmet:  Not Applicable  Comments: Pt able to follow exercise prescription today without complaint.  Will continue to monitor for progression.    Dr. Bethann Punches is Medical Director for [facility] Cardiac Rehabilitation.  Dr. Vida Rigger is Medical Director for [facility] Pulmonary Rehabilitation.

## 2023-01-30 ENCOUNTER — Ambulatory Visit: Payer: Medicare Other

## 2023-01-31 ENCOUNTER — Encounter: Payer: Self-pay | Admitting: *Deleted

## 2023-01-31 ENCOUNTER — Encounter: Payer: Medicare Other | Attending: Cardiology | Admitting: *Deleted

## 2023-01-31 DIAGNOSIS — I214 Non-ST elevation (NSTEMI) myocardial infarction: Secondary | ICD-10-CM

## 2023-01-31 DIAGNOSIS — Z955 Presence of coronary angioplasty implant and graft: Secondary | ICD-10-CM | POA: Diagnosis not present

## 2023-01-31 DIAGNOSIS — I252 Old myocardial infarction: Secondary | ICD-10-CM | POA: Diagnosis not present

## 2023-01-31 DIAGNOSIS — Z48812 Encounter for surgical aftercare following surgery on the circulatory system: Secondary | ICD-10-CM | POA: Diagnosis not present

## 2023-01-31 NOTE — Progress Notes (Signed)
Cardiac Individual Treatment Plan  Patient Details  Name: Whitney Knight MRN: [id number] Date of Birth: 04-Feb-1951 Referring Provider:   Flowsheet Row Cardiac Rehab from 01/04/2023 in [facility] Cardiac and Pulmonary Rehab  Referring Provider Dr. Herbie Baltimore       Initial Encounter Date:  Flowsheet Row Cardiac Rehab from 01/04/2023 in [facility] Cardiac and Pulmonary Rehab  Date 01/04/23       Visit Diagnosis: NSTEMI (non-ST elevation myocardial infarction) [facility])  Status post coronary artery stent placement  Patient's Home Medications on Admission:  Current Outpatient Medications:    aspirin EC 81 MG tablet, Take 1 tablet (81 mg total) by mouth daily. Swallow whole., Disp: , Rfl:    blood glucose meter kit and supplies KIT, One touch ultra mini. Use 2-4 times per day., Disp: 1 each, Rfl: 0   candesartan (ATACAND) 4 MG tablet, Take 0.5 tablets (2 mg total) by mouth daily. (Patient not taking: Reported on 01/11/2023), Disp: 15 tablet, Rfl: 5   carvedilol (COREG) 6.25 MG tablet, Take 0.5 tablets (3.125 mg total) by mouth 2 (two) times daily with a meal., Disp: 60 tablet, Rfl: 3   cetirizine (ZYRTEC) 10 MG chewable tablet, Chew 10 mg by mouth daily., Disp: , Rfl:    Continuous Glucose Receiver (DEXCOM G7 RECEIVER) DEVI, Use to check glucose at least every 8 hours., Disp: 1 each, Rfl: 0   Continuous Glucose Sensor (DEXCOM G7 SENSOR) MISC, Apply every 10 days, Disp: 3 each, Rfl: 3   dapagliflozin propanediol (FARXIGA) 10 MG TABS tablet, Take 1 tablet (10 mg total) by mouth daily., Disp: 90 tablet, Rfl: 1   fluticasone (FLONASE) 50 MCG/ACT nasal spray, USE TWO SPRAYS IN EACH NOSTRIL DAILY, Disp: 16 g, Rfl: 3   Lancets (ONETOUCH DELICA PLUS LANCET33G) MISC, USE ONCE DAILY AS DIRECTED, Disp: 100 each, Rfl: 1   levOCARNitine (L-CARNITINE PO), Take 2 tablets by mouth daily at 6 (six) AM., Disp: , Rfl:    levothyroxine (SYNTHROID) 75 MCG tablet, TAKE 1 TABLET BY MOUTH DAILY, Disp: 90 tablet, Rfl: 1   ONETOUCH  ULTRA test strip, USE TO CHECK BLOOD SUGAR UP TO THREE TIMES DAILY, Disp: 400 strip, Rfl: 1   saccharomyces boulardii (FLORASTOR) 250 MG capsule, Take 1 capsule (250 mg total) by mouth daily., Disp: 90 capsule, Rfl: 0   spironolactone (ALDACTONE) 25 MG tablet, Take 0.5 tablets (12.5 mg total) by mouth daily., Disp: 45 tablet, Rfl: 1   ticagrelor (BRILINTA) 90 MG TABS tablet, Take 1 tablet (90 mg total) by mouth 2 (two) times daily., Disp: 180 tablet, Rfl: 1   tirzepatide (MOUNJARO) 12.5 MG/0.5ML Pen, Inject 12.5 mg into the skin once a week., Disp: 6 mL, Rfl: 2  Past Medical History: Past Medical History:  Diagnosis Date   Allergy    Anemia    Arthritis    KNEES   Cataract    Chronic sinusitis    Cough    Diabetes mellitus    Diverticulosis    GERD (gastroesophageal reflux disease)    Headache(784.0)    [facility] in past   Hypertension    Osteoporosis    Thyroid disease     Tobacco Use: Social History   Tobacco Use  Smoking Status Never  Smokeless Tobacco Never    Labs: Review Flowsheet  More data exists      Latest Ref Rng & Units 01/11/2022 05/18/2022 08/21/2022 12/14/2022 01/22/2023  Labs for ITP Cardiac and Pulmonary Rehab  Cholestrol 0 - 200 mg/dL - 086  -  141  -  LDL (calc) 0 - 99 mg/dL - 952  - 87  -  HDL-C >84 mg/dL - 13.24  - 36  -  Trlycerides <150 mg/dL - 401.0  - 91  -  Hemoglobin A1c 4.8 - 5.6 % 7.1  7.4  7.0  - 6.6   PH, Arterial 7.35 - 7.45 - - - 7.493  -  PCO2 arterial 32 - 48 mmHg - - - 39.8  -  Bicarbonate 20.0 - 28.0 mmol/L - - - 31.7  30.5  -  TCO2 22 - 32 mmol/L - - - 33  32  -  O2 Saturation % - - - 68  92  -    Details       Multiple values from one day are sorted in reverse-chronological order          Exercise Target Goals: Exercise Program Goal: Individual exercise prescription set using results from initial 6 min walk test and THRR while considering  patient's activity barriers and safety.   Exercise Prescription  Goal: Initial exercise prescription builds to 30-45 minutes a day of aerobic activity, 2-3 days per week.  Home exercise guidelines will be given to patient during program as part of exercise prescription that the participant will acknowledge.   Education: Aerobic Exercise: - Group verbal and visual presentation on the components of exercise prescription. Introduces F.I.T.T principle from ACSM for exercise prescriptions.  Reviews F.I.T.T. principles of aerobic exercise including progression. Written material given at graduation. Flowsheet Row Cardiac Rehab from 01/04/2023 in [facility] Cardiac and Pulmonary Rehab  Education need identified 01/04/23       Education: Resistance Exercise: - Group verbal and visual presentation on the components of exercise prescription. Introduces F.I.T.T principle from ACSM for exercise prescriptions  Reviews F.I.T.T. principles of resistance exercise including progression. Written material given at graduation.    Education: Exercise & Equipment Safety: - Individual verbal instruction and demonstration of equipment use and safety with use of the equipment. Flowsheet Row Cardiac Rehab from 01/04/2023 in [facility] Cardiac and Pulmonary Rehab  Date 01/04/23  Educator MB  Instruction Review Code 1- Verbalizes Understanding       Education: Exercise Physiology & General Exercise Guidelines: - Group verbal and written instruction with models to review the exercise physiology of the cardiovascular system and associated critical values. Provides general exercise guidelines with specific guidelines to those with heart or lung disease.    Education: Flexibility, Balance, Mind/Body Relaxation: - Group verbal and visual presentation with interactive activity on the components of exercise prescription. Introduces F.I.T.T principle from ACSM for exercise prescriptions. Reviews F.I.T.T. principles of flexibility and balance exercise training including progression. Also discusses the  mind body connection.  Reviews various relaxation techniques to help reduce and manage stress (i.e. Deep breathing, progressive muscle relaxation, and visualization). Balance handout provided to take home. Written material given at graduation.   Activity Barriers & Risk Stratification:  Activity Barriers & Cardiac Risk Stratification - 01/04/23 1651       Activity Barriers & Cardiac Risk Stratification   Activity Barriers None    Cardiac Risk Stratification Moderate             6 Minute Walk:  6 Minute Walk     Row Name 01/04/23 1639         6 Minute Walk   Phase Initial     Distance 1380 feet     Walk Time 6 minutes     # of Rest  Breaks 0     MPH 2.61     METS 3.18     RPE 7     Perceived Dyspnea  0     VO2 Peak 11.13     Symptoms No     Resting HR 97 bpm     Resting BP 100/62     Resting Oxygen Saturation  99 %     Exercise Oxygen Saturation  during 6 min walk 99 %     Max Ex. HR 113 bpm     Max Ex. BP 112/70     2 Minute Post BP 106/60              Oxygen Initial Assessment:   Oxygen Re-Evaluation:   Oxygen Discharge (Final Oxygen Re-Evaluation):   Initial Exercise Prescription:  Initial Exercise Prescription - 01/04/23 1600       Date of Initial Exercise RX and Referring Provider   Date 01/04/23    Referring Provider Dr. Herbie Baltimore      Oxygen   Maintain Oxygen Saturation 88% or higher      Treadmill   MPH 2.6    Grade 0    Minutes 15    METs 3      Recumbant Bike   Level 3    RPM 50    Watts 22    Minutes 15    METs 3      NuStep   Level 3    SPM 80    Minutes 15    METs 3      Prescription Details   Frequency (times per week) 2    Duration Progress to 30 minutes of continuous aerobic without signs/symptoms of physical distress      Intensity   THRR 40-80% of Max Heartrate 118-139    Ratings of Perceived Exertion 11-13    Perceived Dyspnea 0-4      Progression   Progression Continue to progress workloads to maintain  intensity without signs/symptoms of physical distress.      Resistance Training   Training Prescription Yes    Weight 2 lb    Reps 10-15             Perform Capillary Blood Glucose checks as needed.  Exercise Prescription Changes:   Exercise Prescription Changes     Row Name 01/04/23 1600 01/19/23 0800           Response to Exercise   Blood Pressure (Admit) 100/62 102/60      Blood Pressure (Exercise) 112/70 104/58      Blood Pressure (Exit) 106/60 104/60      Heart Rate (Admit) 97 bpm 90 bpm      Heart Rate (Exercise) 113 bpm 106 bpm      Heart Rate (Exit) 84 bpm 93 bpm      Oxygen Saturation (Admit) 99 % --      Oxygen Saturation (Exercise) 99 % --      Oxygen Saturation (Exit) 99 % --      Rating of Perceived Exertion (Exercise) 7 13      Perceived Dyspnea (Exercise) 0 --      Symptoms none none      Comments results First two days of exercise      Duration -- Progress to 30 minutes of  aerobic without signs/symptoms of physical distress      Intensity -- THRR unchanged        Progression   Progression Continue to progress  workloads to maintain intensity without signs/symptoms of physical distress. Continue to progress workloads to maintain intensity without signs/symptoms of physical distress.      Average METs 3 2.91        Resistance Training   Training Prescription -- Yes      Weight -- 2 lb      Reps -- 10-15        Interval Training   Interval Training -- No        Treadmill   MPH -- 2.6      Grade -- 0      Minutes -- 15      METs -- 2.99        Recumbant Bike   Level -- 1.2      Watts -- 22      Minutes -- 15      METs -- 3.14        NuStep   Level -- 3      Minutes -- 15      METs -- 2.6        Oxygen   Maintain Oxygen Saturation -- 88% or higher               Exercise Comments:   Exercise Comments     Row Name 01/08/23 1109           Exercise Comments First full day of exercise!  Patient was oriented to gym and  equipment including functions, settings, policies, and procedures.  Patient's individual exercise prescription and treatment plan were reviewed.  All starting workloads were established based on the results of the 6 minute walk test done at initial orientation visit.  The plan for exercise progression was also introduced and progression will be customized based on patient's performance and goals.                Exercise Goals and Review:   Exercise Goals     Row Name 01/04/23 1645             Exercise Goals   Increase Physical Activity Yes       Intervention Provide advice, education, support and counseling about physical activity/exercise needs.;Develop an individualized exercise prescription for aerobic and resistive training based on initial evaluation findings, risk stratification, comorbidities and participant's personal goals.       Expected Outcomes Short Term: Attend rehab on a regular basis to increase amount of physical activity.;Long Term: Exercising regularly at least 3-5 days a week.;Long Term: Add in home exercise to make exercise part of routine and to increase amount of physical activity.       Increase Strength and Stamina Yes       Intervention Provide advice, education, support and counseling about physical activity/exercise needs.;Develop an individualized exercise prescription for aerobic and resistive training based on initial evaluation findings, risk stratification, comorbidities and participant's personal goals.       Expected Outcomes Short Term: Increase workloads from initial exercise prescription for resistance, speed, and METs.;Short Term: Perform resistance training exercises routinely during rehab and add in resistance training at home;Long Term: Improve cardiorespiratory fitness, muscular endurance and strength as measured by increased METs and functional capacity ( )       Able to understand and use rate of perceived exertion (RPE) scale Yes        Intervention Provide education and explanation on how to use RPE scale       Expected Outcomes Short Term: Able to use RPE daily  in rehab to express subjective intensity level;Long Term:  Able to use RPE to guide intensity level when exercising independently       Able to understand and use Dyspnea scale Yes       Intervention Provide education and explanation on how to use Dyspnea scale       Expected Outcomes Short Term: Able to use Dyspnea scale daily in rehab to express subjective sense of shortness of breath during exertion;Long Term: Able to use Dyspnea scale to guide intensity level when exercising independently       Knowledge and understanding of Target Heart Rate Range (THRR) Yes       Intervention Provide education and explanation of THRR including how the numbers were predicted and where they are located for reference       Expected Outcomes Short Term: Able to state/look up THRR;Long Term: Able to use THRR to govern intensity when exercising independently;Short Term: Able to use daily as guideline for intensity in rehab       Able to check pulse independently Yes       Intervention Provide education and demonstration on how to check pulse in carotid and radial arteries.;Review the importance of being able to check your own pulse for safety during independent exercise       Expected Outcomes Short Term: Able to explain why pulse checking is important during independent exercise;Long Term: Able to check pulse independently and accurately       Understanding of Exercise Prescription Yes       Intervention Provide education, explanation, and written materials on patient's individual exercise prescription       Expected Outcomes Short Term: Able to explain program exercise prescription;Long Term: Able to explain home exercise prescription to exercise independently                Exercise Goals Re-Evaluation :  Exercise Goals Re-Evaluation     Row Name 01/08/23 1109 01/19/23 0824            Exercise Goal Re-Evaluation   Exercise Goals Review Able to understand and use rate of perceived exertion (RPE) scale;Knowledge and understanding of Target Heart Rate Range (THRR);Able to understand and use Dyspnea scale;Understanding of Exercise Prescription Increase Physical Activity;Increase Strength and Stamina;Understanding of Exercise Prescription      Comments Reviewed RPE  and dyspnea scale, THR and program prescription with pt today.  Pt voiced understanding and was given a copy of goals to take home. Claudett is off to a good start in the program. She did well on the treadmill at a speed of 2.6 mph with no incline. She also worked at level 3 on the T4 nustep and level 1.2 on the recumbent bike. She did well with 2 lb hand weights for resistance training during her first two sessions as well. We will continue to monitor her progress.      Expected Outcomes Short: Use RPE daily to regulate intensity. Long: Follow program prescription in THR. Short: Continue to follow current exercise prescription. Long: Continue exercise to increase strength and stamina.               Discharge Exercise Prescription (Final Exercise Prescription Changes):  Exercise Prescription Changes - 01/19/23 0800       Response to Exercise   Blood Pressure (Admit) 102/60    Blood Pressure (Exercise) 104/58    Blood Pressure (Exit) 104/60    Heart Rate (Admit) 90 bpm    Heart Rate (Exercise) 106 bpm  Heart Rate (Exit) 93 bpm    Rating of Perceived Exertion (Exercise) 13    Symptoms none    Comments First two days of exercise    Duration Progress to 30 minutes of  aerobic without signs/symptoms of physical distress    Intensity THRR unchanged      Progression   Progression Continue to progress workloads to maintain intensity without signs/symptoms of physical distress.    Average METs 2.91      Resistance Training   Training Prescription Yes    Weight 2 lb    Reps 10-15      Interval Training    Interval Training No      Treadmill   MPH 2.6    Grade 0    Minutes 15    METs 2.99      Recumbant Bike   Level 1.2    Watts 22    Minutes 15    METs 3.14      NuStep   Level 3    Minutes 15    METs 2.6      Oxygen   Maintain Oxygen Saturation 88% or higher             Nutrition:  Target Goals: Understanding of nutrition guidelines, daily intake of sodium 1500mg , cholesterol 200mg , calories 30% from fat and 7% or less from saturated fats, daily to have 5 or more servings of fruits and vegetables.  Education: All About Nutrition: -Group instruction provided by verbal, written material, interactive activities, discussions, models, and posters to present general guidelines for heart healthy nutrition including fat, fiber, MyPlate, the role of sodium in heart healthy nutrition, utilization of the nutrition label, and utilization of this knowledge for meal planning. Follow up email sent as well. Written material given at graduation. Flowsheet Row Cardiac Rehab from 01/04/2023 in [facility] Cardiac and Pulmonary Rehab  Education need identified 01/04/23       Biometrics:  Pre Biometrics - 01/04/23 1645       Pre Biometrics   Height 5\' 4"  (1.626 m)    Weight 134 lb 11.2 oz (61.1 kg)    Waist Circumference 37.7 inches    Hip Circumference 40.5 inches    Waist to Hip Ratio 0.93 %    BMI (Calculated) 23.11    Single Leg Stand 3 seconds              Nutrition Therapy Plan and Nutrition Goals:  Nutrition Therapy & Goals - 01/08/23 1355       Nutrition Therapy   Diet Carb controlled, Cardiac, low na    Protein (specify units) 90    Fiber 25 grams    Whole Grain Foods 3 servings    Saturated Fats 15 max. grams    Fruits and Vegetables 5 servings/day    Sodium 2 grams      Personal Nutrition Goals   Nutrition Goal Use healthy fats to promote heart health and support calorie intake.    Personal Goal #2 Eat a protein and a controlled portion of carbs at each meal     Personal Goal #3 Watch saturated fat intake, aim for less than ~15g per day on average    Comments Patient drinks mostly water, sometimes zevia. She reports she gets full quickly, uses small snacks and meal replacement shake often to help her get enough nutrition . She doesn't want to lose weight, feels she could gain some weight even. Encouraged her to continue to eat smaller but  more frequent meals, focus in nutrient dense foods over empty calories. She has T2DM, A1C improving, currently at 7.0%. Spoke to her about carb sources, portions, and goal of eating ~30-60g per meal, ~15-30g per snack. Encouraged fibrous, complex carbs like whole grains, legumes, and lentils. Reviewed mediterranean diet handout, types of fats, their sources, how to read label. Her meal replacement was 400kcal, 27gFat, 17gSat fat, 13gCHO, 9gFiber, 27gPro. Spoke to her about how the protein content was good, low in sodium and good calorie count, but that the high saturated fat intake was on concern, than looking for lower sat fat alternatives would be a good idea. She seemed to prefer this meal replacement over trying others at this time. Encouraged her to watch saturated fat intake outside of this meal replacement as a goal to prevent excessive sat fat intake. Also recommended she add a fruit to it as well, since it was low in carbs and almost all of it was fiber.      Intervention Plan   Intervention Prescribe, educate and counsel regarding individualized specific dietary modifications aiming towards targeted core components such as weight, hypertension, lipid management, diabetes, heart failure and other comorbidities.;Nutrition handout(s) given to patient.    Expected Outcomes Short Term Goal: Understand basic principles of dietary content, such as calories, fat, sodium, cholesterol and nutrients.;Short Term Goal: A plan has been developed with personal nutrition goals set during dietitian appointment.;Long Term Goal: Adherence  to prescribed nutrition plan.             Nutrition Assessments:  MEDIFICTS Score Key: >=70 Need to make dietary changes  40-70 Heart Healthy Diet <= 40 Therapeutic Level Cholesterol Diet  Flowsheet Row Cardiac Rehab from 01/04/2023 in [facility] Cardiac and Pulmonary Rehab  Picture Your Plate Total Score on Admission 54      Picture Your Plate Scores: <16 Unhealthy dietary pattern with much room for improvement. 41-50 Dietary pattern unlikely to meet recommendations for good health and room for improvement. 51-60 More healthful dietary pattern, with some room for improvement.  >60 Healthy dietary pattern, although there may be some specific behaviors that could be improved.    Nutrition Goals Re-Evaluation:   Nutrition Goals Discharge (Final Nutrition Goals Re-Evaluation):   Psychosocial: Target Goals: Acknowledge presence or absence of significant depression and/or stress, maximize coping skills, provide positive support system. Participant is able to verbalize types and ability to use techniques and skills needed for reducing stress and depression.   Education: Stress, Anxiety, and Depression - Group verbal and visual presentation to define topics covered.  Reviews how body is impacted by stress, anxiety, and depression.  Also discusses healthy ways to reduce stress and to treat/manage anxiety and depression.  Written material given at graduation.   Education: Sleep Hygiene -Provides group verbal and written instruction about how sleep can affect your health.  Define sleep hygiene, discuss sleep cycles and impact of sleep habits. Review good sleep hygiene tips.    Initial Review & Psychosocial Screening:  Initial Psych Review & Screening - 12/27/22 1436       Initial Review   Current issues with None Identified      Family Dynamics   Good Support System? Yes   family, friends     Barriers   Psychosocial barriers to participate in program The patient should benefit  from training in stress management and relaxation.      Screening Interventions   Interventions To provide support and resources with identified psychosocial needs;Provide feedback about the  scores to participant;Encouraged to exercise    Expected Outcomes Short Term goal: Utilizing psychosocial counselor, staff and physician to assist with identification of specific Stressors or current issues interfering with healing process. Setting desired goal for each stressor or current issue identified.;Long Term Goal: Stressors or current issues are controlled or eliminated.;Short Term goal: Identification and review with participant of any Quality of Life or Depression concerns found by scoring the questionnaire.;Long Term goal: The participant improves quality of Life and PHQ9 Scores as seen by post scores and/or verbalization of changes             Quality of Life Scores:   Quality of Life - 01/04/23 1646       Quality of Life   Select Quality of Life      Quality of Life Scores   Health/Function Pre 27.4 %    Socioeconomic Pre 23.25 %    Psych/Spiritual Pre 29.64 %    Family Pre 30 %    GLOBAL Pre 27.27 %            Scores of 19 and below usually indicate a poorer quality of life in these areas.  A difference of  2-3 points is a clinically meaningful difference.  A difference of 2-3 points in the total score of the Quality of Life Index has been associated with significant improvement in overall quality of life, self-image, physical symptoms, and general health in studies assessing change in quality of life.  PHQ-9: Review Flowsheet  More data exists      01/04/2023 01/03/2023 10/10/2022 08/21/2022 01/11/2022  Depression screen PHQ 2/9  Decreased Interest 0 0 0 0 0  Down, Depressed, Hopeless 0 0 0 0 0  PHQ - 2 Score 0 0 0 0 0  Altered sleeping 0 0 0 0 -  Tired, decreased energy 0 0 0 0 -  Change in appetite 0 0 0 0 -  Feeling bad or failure about yourself  0 0 0 0 -  Trouble  concentrating 0 0 0 0 -  Moving slowly or fidgety/restless 0 - 0 0 -  Suicidal thoughts 0 0 0 0 -  PHQ-9 Score 0 0 0 0 -  Difficult doing work/chores Not difficult at all Not difficult at all Not difficult at all Not difficult at all -    Details           Interpretation of Total Score  Total Score Depression Severity:  1-4 = Minimal depression, 5-9 = Mild depression, 10-14 = Moderate depression, 15-19 = Moderately severe depression, 20-27 = Severe depression   Psychosocial Evaluation and Intervention:  Psychosocial Evaluation - 12/27/22 1451       Psychosocial Evaluation & Interventions   Comments Ariyana has no barriers to attending the program. She lives with her husband, son and daughter.  They and her friends are her support. She is alredy walking about a mile a day in her home and has permission form her doctor to increase her distance. She is wearing a LIFE VEST.  She has lost about 50 pounds in the past 2 years. 10 pounds during her recent hospitalization. She does not want to lose more weight. She is ready to get started.    Expected Outcomes STG She attends all scheduled sessions, she works with the EP staff to progress her exercise back to her level prior to the NSTEMI. She will continue to monitor her weight loss. LTG continue to manage her esercise progression and her weight  after discharge    Continue Psychosocial Services  Follow up required by staff             Psychosocial Re-Evaluation:   Psychosocial Discharge (Final Psychosocial Re-Evaluation):   Vocational Rehabilitation: Provide vocational rehab assistance to qualifying candidates.   Vocational Rehab Evaluation & Intervention:  Vocational Rehab - 12/27/22 1446       Initial Vocational Rehab Evaluation & Intervention   Assessment shows need for Vocational Rehabilitation No      Vocational Rehab Re-Evaulation   Comments retired             Education: Education Goals: Education classes will  be provided on a variety of topics geared toward better understanding of heart health and risk factor modification. Participant will state understanding/return demonstration of topics presented as noted by education test scores.  Learning Barriers/Preferences:   General Cardiac Education Topics:  AED/CPR: - Group verbal and written instruction with the use of models to demonstrate the basic use of the AED with the basic ABC's of resuscitation.   Anatomy and Cardiac Procedures: - Group verbal and visual presentation and models provide information about basic cardiac anatomy and function. Reviews the testing methods done to diagnose heart disease and the outcomes of the test results. Describes the treatment choices: Medical Management, Angioplasty, or Coronary Bypass Surgery for treating various heart conditions including Myocardial Infarction, Angina, Valve Disease, and Cardiac Arrhythmias.  Written material given at graduation. Flowsheet Row Cardiac Rehab from 01/04/2023 in [facility] Cardiac and Pulmonary Rehab  Education need identified 01/04/23       Medication Safety: - Group verbal and visual instruction to review commonly prescribed medications for heart and lung disease. Reviews the medication, class of the drug, and side effects. Includes the steps to properly store meds and maintain the prescription regimen.  Written material given at graduation.   Intimacy: - Group verbal instruction through game format to discuss how heart and lung disease can affect sexual intimacy. Written material given at graduation..   Know Your Numbers and Heart Failure: - Group verbal and visual instruction to discuss disease risk factors for cardiac and pulmonary disease and treatment options.  Reviews associated critical values for Overweight/Obesity, Hypertension, Cholesterol, and Diabetes.  Discusses basics of heart failure: signs/symptoms and treatments.  Introduces Heart Failure Zone chart for action plan  for heart failure.  Written material given at graduation. Flowsheet Row Cardiac Rehab from 01/04/2023 in [facility] Cardiac and Pulmonary Rehab  Education need identified 01/04/23       Infection Prevention: - Provides verbal and written material to individual with discussion of infection control including proper hand washing and proper equipment cleaning during exercise session. Flowsheet Row [facility]hab [facility]  Date 01/04/23  Educator MB  Instruction Review Code 1- Verbalizes Understanding       Falls Prevention: - Provides verbal and written material to individual with discussion of falls prevention and safety. Flowsheet Row Cardiac Rehab from 01/04/2023 in [facility] Cardiac and Pulmonary Rehab  Date 01/04/23  Educator MB  Instruction Review Code 1- Verbalizes Understanding       Other: -Provides group and verbal instruction on various topics (see comments)   Knowledge Questionnaire Score:  Knowledge Questionnaire Score - 01/04/23 1648       Knowledge Questionnaire Score   Pre Score 22/26             Core Components/Risk Factors/Patient Goals at Admission:  Personal Goals and Risk Factors at Admission -  01/04/23 1648       Core Components/Risk Factors/Patient Goals on Admission    Weight Management Yes;Weight Gain    Intervention Weight Management: Develop a combined nutrition and exercise program designed to reach desired caloric intake, while maintaining appropriate intake of nutrient and fiber, sodium and fats, and appropriate energy expenditure required for the weight goal.;Weight Management: Provide education and appropriate resources to help participant work on and attain dietary goals.;Weight Management/Obesity: Establish reasonable short term and long term weight goals.    Admit Weight 134 lb 11.2 oz (61.1 kg)    Goal Weight: Short Term 140 lb (63.5 kg)    Goal Weight: Long Term 140 lb (63.5 kg)    Expected Outcomes Short  Term: Continue to assess and modify interventions until short term weight is achieved;Long Term: Adherence to nutrition and physical activity/exercise program aimed toward attainment of established weight goal;Weight Gain: Understanding of general recommendations for a high calorie, high protein meal plan that promotes weight gain by distributing calorie intake throughout the day with the consumption for 4-5 meals, snacks, and/or supplements;Understanding recommendations for meals to include 15-35% energy as protein, 25-35% energy from fat, 35-60% energy from carbohydrates, less than 200mg  of dietary cholesterol, 20-35 gm of total fiber daily;Understanding of distribution of calorie intake throughout the day with the consumption of 4-5 meals/snacks    Diabetes Yes    Intervention Provide education about signs/symptoms and action to take for hypo/hyperglycemia.;Provide education about proper nutrition, including hydration, and aerobic/resistive exercise prescription along with prescribed medications to achieve blood glucose in normal ranges: Fasting glucose 65-99 mg/dL    Expected Outcomes Short Term: Participant verbalizes understanding of the signs/symptoms and immediate care of hyper/hypoglycemia, proper foot care and importance of medication, aerobic/resistive exercise and nutrition plan for blood glucose control.;Long Term: Attainment of HbA1C < 7%.    Heart Failure Yes    Intervention Provide a combined exercise and nutrition program that is supplemented with education, support and counseling about heart failure. Directed toward relieving symptoms such as shortness of breath, decreased exercise tolerance, and extremity edema.    Expected Outcomes Improve functional capacity of life;Short term: Attendance in program 2-3 days a week with increased exercise capacity. Reported lower sodium intake. Reported increased fruit and vegetable intake. Reports medication compliance.;Short term: Daily weights obtained  and reported for increase. Utilizing diuretic protocols set by physician.;Long term: Adoption of self-care skills and reduction of barriers for early signs and symptoms recognition and intervention leading to self-care maintenance.    Hypertension Yes    Intervention Provide education on lifestyle modifcations including regular physical activity/exercise, weight management, moderate sodium restriction and increased consumption of fresh fruit, vegetables, and low fat dairy, alcohol moderation, and smoking cessation.;Monitor prescription use compliance.    Expected Outcomes Short Term: Continued assessment and intervention until BP is < 140/65mm HG in hypertensive participants. < 130/51mm HG in hypertensive participants with diabetes, heart failure or chronic kidney disease.;Long Term: Maintenance of blood pressure at goal levels.             Education:Diabetes - Individual verbal and written instruction to review signs/symptoms of diabetes, desired ranges of glucose level fasting, after meals and with exercise. Acknowledge that pre and post exercise glucose checks will be done for 3 sessions at entry of program. Flowsheet Row Cardiac Rehab from 01/04/2023 in [facility] Cardiac and Pulmonary Rehab  Date 01/04/23  Educator MB  Instruction Review Code 1- Verbalizes Understanding       Core Components/Risk Factors/Patient Goals Review:  Core Components/Risk Factors/Patient Goals at Discharge (Final Review):    ITP Comments:  ITP Comments     Row Name 12/27/22 1447 01/04/23 1639 01/08/23 1109 01/31/23 1016     ITP Comments Virtual orientation call completed today. shehas an appointment on Date: 01/04/2023  for EP eval and gym Orientation.  Documentation of diagnosis can be found in [facility] 12/12/2022 . Completed and gym orientation. Initial ITP created and sent for review to Bethann Punches, Medical Director. First full day of exercise!  Patient was oriented to gym and equipment including functions,  settings, policies, and procedures.  Patient's individual exercise prescription and treatment plan were reviewed.  All starting workloads were established based on the results of the 6 minute walk test done at initial orientation visit.  The plan for exercise progression was also introduced and progression will be customized based on patient's performance and goals. 30 Day review completed. Medical Director ITP review done, changes made as directed, and signed approval by Medical Director.    new to program             Comments:

## 2023-01-31 NOTE — Progress Notes (Signed)
Daily Session Note  Patient Details  Name: Whitney Knight MRN: [id number] Date of Birth: 30-Jul-1950 Referring Provider:   Flowsheet Row Cardiac Rehab from 01/04/2023 in [facility] Cardiac and Pulmonary Rehab  Referring Provider Dr. Herbie Baltimore       Encounter Date: 01/31/2023  Check In:  Session Check In - 01/31/23 1013       Check-In   Supervising physician immediately available to respond to emergencies See telemetry face sheet for immediately available ER MD    Location [facility]-Cardiac & Pulmonary Rehab    Staff Present Rory Percy, MS, Exercise Physiologist;Joseph Shelbie Proctor, RN, ADN    Virtual Visit No    Medication changes reported     No    Fall or balance concerns reported    No    Warm-up and Cool-down Performed on first and last piece of equipment    Resistance Training Performed Yes    VAD Patient? No    PAD/SET Patient? No      Pain Assessment   Currently in Pain? No/denies                Social History   Tobacco Use  Smoking Status Never  Smokeless Tobacco Never    Goals Met:  Independence with exercise equipment Exercise tolerated well No report of concerns or symptoms today Strength training completed today  Goals Unmet:  Not Applicable  Comments: Pt able to follow exercise prescription today without complaint.  Will continue to monitor for progression.    Dr. Bethann Punches is Medical Director for [facility] Cardiac Rehabilitation.  Dr. Vida Rigger is Medical Director for [facility] Pulmonary Rehabilitation.

## 2023-02-01 ENCOUNTER — Other Ambulatory Visit: Payer: Self-pay

## 2023-02-01 ENCOUNTER — Ambulatory Visit: Payer: Medicare Other

## 2023-02-02 DIAGNOSIS — H52223 Regular astigmatism, bilateral: Secondary | ICD-10-CM | POA: Diagnosis not present

## 2023-02-02 DIAGNOSIS — H524 Presbyopia: Secondary | ICD-10-CM | POA: Diagnosis not present

## 2023-02-02 DIAGNOSIS — H5203 Hypermetropia, bilateral: Secondary | ICD-10-CM | POA: Diagnosis not present

## 2023-02-02 LAB — HM DIABETES EYE EXAM

## 2023-02-05 ENCOUNTER — Encounter: Payer: Medicare Other | Admitting: *Deleted

## 2023-02-06 ENCOUNTER — Ambulatory Visit: Payer: Medicare Other

## 2023-02-07 ENCOUNTER — Encounter: Payer: Self-pay | Admitting: Family Medicine

## 2023-02-07 ENCOUNTER — Encounter: Payer: Medicare Other | Admitting: *Deleted

## 2023-02-07 DIAGNOSIS — I214 Non-ST elevation (NSTEMI) myocardial infarction: Secondary | ICD-10-CM

## 2023-02-07 DIAGNOSIS — Z48812 Encounter for surgical aftercare following surgery on the circulatory system: Secondary | ICD-10-CM | POA: Diagnosis not present

## 2023-02-07 DIAGNOSIS — Z955 Presence of coronary angioplasty implant and graft: Secondary | ICD-10-CM

## 2023-02-07 DIAGNOSIS — I252 Old myocardial infarction: Secondary | ICD-10-CM | POA: Diagnosis not present

## 2023-02-07 NOTE — Progress Notes (Signed)
Daily Session Note  Patient Details  Name: Whitney Knight MRN: [id number] Date of Birth: Nov 03, 1950 Referring Provider:   Flowsheet Row Cardiac Rehab from 01/04/2023 in [facility] Cardiac and Pulmonary Rehab  Referring Provider Dr. Herbie Baltimore       Encounter Date: 02/07/2023  Check In:  Session Check In - 02/07/23 0922       Check-In   Supervising physician immediately available to respond to emergencies See telemetry face sheet for immediately available ER MD    Location [facility]-Cardiac & Pulmonary Rehab    Staff Present Rory Percy, MS, Exercise Physiologist;Joseph Reino Kent, RCP,RRT,BSRT;Maxon Racine BS, , Exercise Physiologist;Lawan Nanez Katrinka Blazing, RN, ADN    Virtual Visit No    Medication changes reported     No    Fall or balance concerns reported    No    Warm-up and Cool-down Performed on first and last piece of equipment    Resistance Training Performed Yes    VAD Patient? No    PAD/SET Patient? No      Pain Assessment   Currently in Pain? No/denies                Social History   Tobacco Use  Smoking Status Never  Smokeless Tobacco Never    Goals Met:  Independence with exercise equipment Exercise tolerated well No report of concerns or symptoms today Strength training completed today  Goals Unmet:  Not Applicable  Comments: Pt able to follow exercise prescription today without complaint.  Will continue to monitor for progression.    Dr. Bethann Punches is Medical Director for [facility] Cardiac Rehabilitation.  Dr. Vida Rigger is Medical Director for [facility] Pulmonary Rehabilitation.

## 2023-02-08 ENCOUNTER — Ambulatory Visit: Payer: Medicare Other

## 2023-02-08 ENCOUNTER — Encounter: Payer: Medicare Other | Admitting: Internal Medicine

## 2023-02-10 ENCOUNTER — Other Ambulatory Visit: Payer: Self-pay | Admitting: Family Medicine

## 2023-02-12 ENCOUNTER — Encounter: Payer: Medicare Other | Admitting: *Deleted

## 2023-02-12 DIAGNOSIS — I252 Old myocardial infarction: Secondary | ICD-10-CM | POA: Diagnosis not present

## 2023-02-12 DIAGNOSIS — Z48812 Encounter for surgical aftercare following surgery on the circulatory system: Secondary | ICD-10-CM | POA: Diagnosis not present

## 2023-02-12 DIAGNOSIS — Z955 Presence of coronary angioplasty implant and graft: Secondary | ICD-10-CM | POA: Diagnosis not present

## 2023-02-12 DIAGNOSIS — I214 Non-ST elevation (NSTEMI) myocardial infarction: Secondary | ICD-10-CM

## 2023-02-12 NOTE — Progress Notes (Signed)
Daily Session Note  Patient Details  Name: Whitney Knight MRN: [id number] Date of Birth: 1951-03-22 Referring Provider:   Flowsheet Row Cardiac Rehab from 01/04/2023 in [facility] Cardiac and Pulmonary Rehab  Referring Provider Dr. Herbie Baltimore       Encounter Date: 02/12/2023  Check In:  Session Check In - 02/12/23 0929       Check-In   Supervising physician immediately available to respond to emergencies See telemetry face sheet for immediately available ER MD    Location [facility]-Cardiac & Pulmonary Rehab    Staff Present Rory Percy, MS, Exercise Physiologist;Maxon Suzzette Righter, , Exercise Physiologist;Kelly Madilyn Fireman, BS, ACSM CEP, Exercise Physiologist;Sharnetta Gielow Katrinka Blazing, RN, ADN    Virtual Visit No    Medication changes reported     No    Fall or balance concerns reported    No    Warm-up and Cool-down Performed on first and last piece of equipment    Resistance Training Performed Yes    VAD Patient? No    PAD/SET Patient? No      Pain Assessment   Currently in Pain? No/denies                Social History   Tobacco Use  Smoking Status Never  Smokeless Tobacco Never    Goals Met:  Independence with exercise equipment Exercise tolerated well No report of concerns or symptoms today Strength training completed today  Goals Unmet:  Not Applicable  Comments: Pt able to follow exercise prescription today without complaint.  Will continue to monitor for progression.    Dr. Bethann Punches is Medical Director for [facility] Cardiac Rehabilitation.  Dr. Vida Rigger is Medical Director for [facility] Pulmonary Rehabilitation.

## 2023-02-13 ENCOUNTER — Ambulatory Visit: Payer: Medicare Other

## 2023-02-14 ENCOUNTER — Encounter: Payer: Medicare Other | Admitting: *Deleted

## 2023-02-14 DIAGNOSIS — Z955 Presence of coronary angioplasty implant and graft: Secondary | ICD-10-CM | POA: Diagnosis not present

## 2023-02-14 DIAGNOSIS — I252 Old myocardial infarction: Secondary | ICD-10-CM | POA: Diagnosis not present

## 2023-02-14 DIAGNOSIS — Z48812 Encounter for surgical aftercare following surgery on the circulatory system: Secondary | ICD-10-CM | POA: Diagnosis not present

## 2023-02-14 DIAGNOSIS — I214 Non-ST elevation (NSTEMI) myocardial infarction: Secondary | ICD-10-CM

## 2023-02-14 NOTE — Progress Notes (Signed)
Daily Session Note  Patient Details  Name: Whitney Knight MRN: [id number] Date of Birth: [birth year] Referring Provider:   Flowsheet Row Cardiac Rehab from 01/04/2023 in [facility] Cardiac and Pulmonary Rehab  Referring Provider Dr. Herbie Baltimore       Encounter Date: 02/14/2023  Check In:  Session Check In - 02/14/23 0925       Check-In   Supervising physician immediately available to respond to emergencies See telemetry face sheet for immediately available ER MD    Location [facility]-Cardiac & Pulmonary Rehab    Staff Present Cora Collum, RN, BSN, CCRP;Joseph Hood, RCP,RRT,BSRT;Maxon Dayton BS, , Exercise Physiologist;Cheyann Blecha Katrinka Blazing, RN, [place]    Virtual Visit No    Medication changes reported     No    Fall or balance concerns reported    No    Warm-up and Cool-down Performed on first and last piece of equipment    Resistance Training Performed Yes    VAD Patient? No    PAD/SET Patient? No      Pain Assessment   Currently in Pain? No/denies                Social History   Tobacco Use  Smoking Status Never  Smokeless Tobacco Never    Goals Met:  Independence with exercise equipment Exercise tolerated well No report of concerns or symptoms today Strength training completed today  Goals Unmet:  Not Applicable  Comments: Pt able to follow exercise prescription today without complaint.  Will continue to monitor for progression.    Dr. Bethann Punches is Medical Director for [facility] Cardiac Rehabilitation.  Dr. Vida Rigger is Medical Director for [facility] Pulmonary Rehabilitation.

## 2023-02-15 ENCOUNTER — Ambulatory Visit: Payer: Medicare Other

## 2023-02-15 DIAGNOSIS — I420 Dilated cardiomyopathy: Secondary | ICD-10-CM | POA: Diagnosis not present

## 2023-02-15 DIAGNOSIS — I214 Non-ST elevation (NSTEMI) myocardial infarction: Secondary | ICD-10-CM | POA: Diagnosis not present

## 2023-02-19 ENCOUNTER — Encounter: Payer: Medicare Other | Admitting: *Deleted

## 2023-02-19 DIAGNOSIS — I214 Non-ST elevation (NSTEMI) myocardial infarction: Secondary | ICD-10-CM

## 2023-02-19 DIAGNOSIS — Z955 Presence of coronary angioplasty implant and graft: Secondary | ICD-10-CM | POA: Diagnosis not present

## 2023-02-19 DIAGNOSIS — Z48812 Encounter for surgical aftercare following surgery on the circulatory system: Secondary | ICD-10-CM | POA: Diagnosis not present

## 2023-02-19 DIAGNOSIS — I252 Old myocardial infarction: Secondary | ICD-10-CM | POA: Diagnosis not present

## 2023-02-19 NOTE — Progress Notes (Signed)
Daily Session Note  Patient Details  Name: Whitney Knight MRN: [id number] Date of Birth: May 01, 1951 Referring Provider:   Flowsheet Row Cardiac Rehab from 01/04/2023 in [facility] Cardiac and Pulmonary Rehab  Referring Provider Dr. Herbie Baltimore       Encounter Date: 02/19/2023  Check In:  Session Check In - 02/19/23 0940       Check-In   Location [facility]-Cardiac & Pulmonary Rehab    Staff Present Tommye Standard, BS, ACSM CEP, Exercise Physiologist;Maxon Conetta BS, , Exercise Physiologist;Margaret Best, MS, Exercise Physiologist;Yaremi Stahlman, RN, BSN, CCRP    Virtual Visit No    Medication changes reported     No    Fall or balance concerns reported    No    Warm-up and Cool-down Performed on first and last piece of equipment    Resistance Training Performed Yes    VAD Patient? No    PAD/SET Patient? No      Pain Assessment   Currently in Pain? No/denies                Social History   Tobacco Use  Smoking Status Never  Smokeless Tobacco Never    Goals Met:  Independence with exercise equipment Exercise tolerated well No report of concerns or symptoms today  Goals Unmet:  Not Applicable  Comments: Pt able to follow exercise prescription today without complaint.  Will continue to monitor for progression.    Dr. Bethann Punches is Medical Director for [facility] Cardiac Rehabilitation.  Dr. Vida Rigger is Medical Director for [facility] Pulmonary Rehabilitation.

## 2023-02-20 ENCOUNTER — Ambulatory Visit: Payer: Medicare Other

## 2023-02-21 ENCOUNTER — Encounter: Payer: Medicare Other | Admitting: *Deleted

## 2023-02-22 ENCOUNTER — Ambulatory Visit: Payer: Medicare Other

## 2023-02-26 ENCOUNTER — Encounter: Payer: Medicare Other | Admitting: *Deleted

## 2023-02-26 ENCOUNTER — Other Ambulatory Visit: Payer: Self-pay

## 2023-02-26 DIAGNOSIS — Z48812 Encounter for surgical aftercare following surgery on the circulatory system: Secondary | ICD-10-CM | POA: Diagnosis not present

## 2023-02-26 DIAGNOSIS — I214 Non-ST elevation (NSTEMI) myocardial infarction: Secondary | ICD-10-CM

## 2023-02-26 DIAGNOSIS — Z955 Presence of coronary angioplasty implant and graft: Secondary | ICD-10-CM | POA: Diagnosis not present

## 2023-02-26 DIAGNOSIS — I252 Old myocardial infarction: Secondary | ICD-10-CM | POA: Diagnosis not present

## 2023-02-26 NOTE — Progress Notes (Signed)
Daily Session Note  Patient Details  Name: Whitney Knight MRN: [id number] Date of Birth: September 30, 1950 Referring Provider:   Flowsheet Row Cardiac Rehab from 01/04/2023 in [facility] Cardiac and Pulmonary Rehab  Referring Provider Dr. Herbie Baltimore       Encounter Date: 02/26/2023  Check In:  Session Check In - 02/26/23 0944       Check-In   Supervising physician immediately available to respond to emergencies See telemetry face sheet for immediately available ER MD    Location [facility]-Cardiac & Pulmonary Rehab    Staff Present Cora Collum, RN, BSN, CCRP;Meredith Jewel Baize, RN Mabeline Caras, BS, ACSM CEP, Exercise Physiologist;Maxon Conetta BS, , Exercise Physiologist;Margaret Best, MS, Exercise Physiologist    Virtual Visit No    Medication changes reported     No    Fall or balance concerns reported    No    Warm-up and Cool-down Performed on first and last piece of equipment    Resistance Training Performed Yes    VAD Patient? No    PAD/SET Patient? No      Pain Assessment   Currently in Pain? No/denies                Social History   Tobacco Use  Smoking Status Never  Smokeless Tobacco Never    Goals Met:  Independence with exercise equipment Exercise tolerated well No report of concerns or symptoms today  Goals Unmet:  Not Applicable  Comments: Pt able to follow exercise prescription today without complaint.  Will continue to monitor for progression.    Dr. Bethann Punches is Medical Director for [facility] Cardiac Rehabilitation.  Dr. Vida Rigger is Medical Director for [facility] Pulmonary Rehabilitation.

## 2023-02-27 ENCOUNTER — Ambulatory Visit: Payer: Medicare Other

## 2023-02-28 ENCOUNTER — Encounter: Payer: Medicare Other | Admitting: *Deleted

## 2023-02-28 ENCOUNTER — Encounter: Payer: Self-pay | Admitting: *Deleted

## 2023-02-28 DIAGNOSIS — I214 Non-ST elevation (NSTEMI) myocardial infarction: Secondary | ICD-10-CM

## 2023-02-28 DIAGNOSIS — Z955 Presence of coronary angioplasty implant and graft: Secondary | ICD-10-CM

## 2023-02-28 DIAGNOSIS — Z48812 Encounter for surgical aftercare following surgery on the circulatory system: Secondary | ICD-10-CM | POA: Diagnosis not present

## 2023-02-28 DIAGNOSIS — I252 Old myocardial infarction: Secondary | ICD-10-CM | POA: Diagnosis not present

## 2023-02-28 NOTE — Progress Notes (Signed)
Daily Session Note  Patient Details  Name: Whitney Knight MRN: [id number] Date of Birth: 30-Oct-1950 Referring Provider:   Flowsheet Row Cardiac Rehab from 01/04/2023 in [facility] Cardiac and Pulmonary Rehab  Referring Provider Dr. Herbie Baltimore       Encounter Date: 02/28/2023  Check In:  Session Check In - 02/28/23 0930       Check-In   Supervising physician immediately available to respond to emergencies See telemetry face sheet for immediately available ER MD    Location [facility]-Cardiac & Pulmonary Rehab    Staff Present Cora Collum, RN, BSN, CCRP;Joseph Hood, RCP,RRT,BSRT;Maxon Moreland BS, , Exercise Physiologist;Francessca Friis Katrinka Blazing, RN, [place]    Virtual Visit No    Medication changes reported     No    Fall or balance concerns reported    No    Warm-up and Cool-down Performed on first and last piece of equipment    Resistance Training Performed Yes    VAD Patient? No    PAD/SET Patient? No      Pain Assessment   Currently in Pain? No/denies                Social History   Tobacco Use  Smoking Status Never  Smokeless Tobacco Never    Goals Met:  Independence with exercise equipment Exercise tolerated well No report of concerns or symptoms today Strength training completed today  Goals Unmet:  Not Applicable  Comments: Pt able to follow exercise prescription today without complaint.  Will continue to monitor for progression.    Dr. Bethann Punches is Medical Director for [facility] Cardiac Rehabilitation.  Dr. Vida Rigger is Medical Director for [facility] Pulmonary Rehabilitation.

## 2023-02-28 NOTE — Progress Notes (Signed)
Cardiac Individual Treatment Plan  Patient Details  Name: Whitney Knight MRN: [id number] Date of Birth: [birth year] Referring Provider:   Flowsheet Row Cardiac Rehab from 01/04/2023 in [facility] Cardiac and Pulmonary Rehab  Referring Provider Dr. Herbie Baltimore       Initial Encounter Date:  Flowsheet Row Cardiac Rehab from 01/04/2023 in [facility] Cardiac and Pulmonary Rehab  Date 01/04/23       Visit Diagnosis: NSTEMI (non-ST elevation myocardial infarction) [facility])  Status post coronary artery stent placement  Patient's Home Medications on Admission:  Current Outpatient Medications:    aspirin EC 81 MG tablet, Take 1 tablet (81 mg total) by mouth daily. Swallow whole., Disp: , Rfl:    blood glucose meter kit and supplies KIT, One touch ultra mini. Use 2-4 times per day., Disp: 1 each, Rfl: 0   candesartan (ATACAND) 4 MG tablet, Take 0.5 tablets (2 mg total) by mouth daily. (Patient not taking: Reported on 01/11/2023), Disp: 15 tablet, Rfl: 5   carvedilol (COREG) 6.25 MG tablet, Take 0.5 tablets (3.125 mg total) by mouth 2 (two) times daily with a meal., Disp: 60 tablet, Rfl: 3   cetirizine (ZYRTEC) 10 MG chewable tablet, Chew 10 mg by mouth daily., Disp: , Rfl:    Continuous Glucose Receiver (DEXCOM G7 RECEIVER) DEVI, Use to check glucose at least every 8 hours., Disp: 1 each, Rfl: 0   Continuous Glucose Sensor (DEXCOM G7 SENSOR) MISC, Apply every 10 days, Disp: 3 each, Rfl: 3   dapagliflozin propanediol (FARXIGA) 10 MG TABS tablet, Take 1 tablet (10 mg total) by mouth daily., Disp: 90 tablet, Rfl: 1   fluticasone (FLONASE) 50 MCG/ACT nasal spray, USE TWO SPRAYS IN EACH NOSTRIL DAILY, Disp: 16 g, Rfl: 3   Lancets (ONETOUCH DELICA PLUS LANCET33G) MISC, USE ONCE DAILY AS DIRECTED, Disp: 100 each, Rfl: 1   levOCARNitine (L-CARNITINE PO), Take 2 tablets by mouth daily at 6 (six) AM., Disp: , Rfl:    levothyroxine (SYNTHROID) 75 MCG tablet, TAKE 1 TABLET BY MOUTH DAILY, Disp: 90 tablet, Rfl: 1   ONETOUCH  ULTRA test strip, USE TO CHECK BLOOD SUGAR UP TO THREE TIMES DAILY, Disp: 400 strip, Rfl: 1   saccharomyces boulardii (FLORASTOR) 250 MG capsule, Take 1 capsule (250 mg total) by mouth daily., Disp: 90 capsule, Rfl: 0   spironolactone (ALDACTONE) 25 MG tablet, Take 0.5 tablets (12.5 mg total) by mouth daily., Disp: 45 tablet, Rfl: 1   ticagrelor (BRILINTA) 90 MG TABS tablet, Take 1 tablet (90 mg total) by mouth 2 (two) times daily., Disp: 180 tablet, Rfl: 1   tirzepatide (MOUNJARO) 12.5 MG/0.5ML Pen, Inject 12.5 mg into the skin once a week., Disp: 6 mL, Rfl: 2  Past Medical History: Past Medical History:  Diagnosis Date   Allergy    Anemia    Arthritis    KNEES   Cataract    Chronic sinusitis    Cough    Diabetes mellitus    Diverticulosis    GERD (gastroesophageal reflux disease)    Headache(784.0)    [facility] in past   Hypertension    Osteoporosis    Thyroid disease     Tobacco Use: Social History   Tobacco Use  Smoking Status Never  Smokeless Tobacco Never    Labs: Review Flowsheet  More data exists      Latest Ref Rng & Units 01/11/2022 05/18/2022 08/21/2022 12/14/2022 01/22/2023  Labs for ITP Cardiac and Pulmonary Rehab  Cholestrol 0 - 200 mg/dL - 409  -  141  -  LDL (calc) 0 - 99 mg/dL - 161  - 87  -  HDL-C >09 mg/dL - 60.45  - 36  -  Trlycerides <150 mg/dL - 409.8  - 91  -  Hemoglobin A1c 4.8 - 5.6 % 7.1  7.4  7.0  - 6.6   PH, Arterial 7.35 - 7.45 - - - 7.493  -  PCO2 arterial 32 - 48 mmHg - - - 39.8  -  Bicarbonate 20.0 - 28.0 mmol/L - - - 31.7  30.5  -  TCO2 22 - 32 mmol/L - - - 33  32  -  O2 Saturation % - - - 68  92  -    Details       Multiple values from one day are sorted in reverse-chronological order          Exercise Target Goals: Exercise Program Goal: Individual exercise prescription set using results from initial 6 min walk test and THRR while considering  patient's activity barriers and safety.   Exercise Prescription  Goal: Initial exercise prescription builds to 30-45 minutes a day of aerobic activity, 2-3 days per week.  Home exercise guidelines will be given to patient during program as part of exercise prescription that the participant will acknowledge.   Education: Aerobic Exercise: - Group verbal and visual presentation on the components of exercise prescription. Introduces F.I.T.T principle from ACSM for exercise prescriptions.  Reviews F.I.T.T. principles of aerobic exercise including progression. Written material given at graduation. Flowsheet Row Cardiac Rehab from 01/04/2023 in [facility] Cardiac and Pulmonary Rehab  Education need identified 01/04/23       Education: Resistance Exercise: - Group verbal and visual presentation on the components of exercise prescription. Introduces F.I.T.T principle from ACSM for exercise prescriptions  Reviews F.I.T.T. principles of resistance exercise including progression. Written material given at graduation.    Education: Exercise & Equipment Safety: - Individual verbal instruction and demonstration of equipment use and safety with use of the equipment. Flowsheet Row Cardiac Rehab from 01/04/2023 in [facility] Cardiac and Pulmonary Rehab  Date 01/04/23  Educator MB  Instruction Review Code 1- Verbalizes Understanding       Education: Exercise Physiology & General Exercise Guidelines: - Group verbal and written instruction with models to review the exercise physiology of the cardiovascular system and associated critical values. Provides general exercise guidelines with specific guidelines to those with heart or lung disease.    Education: Flexibility, Balance, Mind/Body Relaxation: - Group verbal and visual presentation with interactive activity on the components of exercise prescription. Introduces F.I.T.T principle from ACSM for exercise prescriptions. Reviews F.I.T.T. principles of flexibility and balance exercise training including progression. Also discusses the  mind body connection.  Reviews various relaxation techniques to help reduce and manage stress (i.e. Deep breathing, progressive muscle relaxation, and visualization). Balance handout provided to take home. Written material given at graduation.   Activity Barriers & Risk Stratification:  Activity Barriers & Cardiac Risk Stratification - 01/04/23 1651       Activity Barriers & Cardiac Risk Stratification   Activity Barriers None    Cardiac Risk Stratification Moderate             6 Minute Walk:  6 Minute Walk     Row Name 01/04/23 1639         6 Minute Walk   Phase Initial     Distance 1380 feet     Walk Time 6 minutes     # of Rest  Breaks 0     MPH 2.61     METS 3.18     RPE 7     Perceived Dyspnea  0     VO2 Peak 11.13     Symptoms No     Resting HR 97 bpm     Resting BP 100/62     Resting Oxygen Saturation  99 %     Exercise Oxygen Saturation  during 6 min walk 99 %     Max Ex. HR 113 bpm     Max Ex. BP 112/70     2 Minute Post BP 106/60              Oxygen Initial Assessment:   Oxygen Re-Evaluation:   Oxygen Discharge (Final Oxygen Re-Evaluation):   Initial Exercise Prescription:  Initial Exercise Prescription - 01/04/23 1600       Date of Initial Exercise RX and Referring Provider   Date 01/04/23    Referring Provider Dr. Herbie Baltimore      Oxygen   Maintain Oxygen Saturation 88% or higher      Treadmill   MPH 2.6    Grade 0    Minutes 15    METs 3      Recumbant Bike   Level 3    RPM 50    Watts 22    Minutes 15    METs 3      NuStep   Level 3    SPM 80    Minutes 15    METs 3      Prescription Details   Frequency (times per week) 2    Duration Progress to 30 minutes of continuous aerobic without signs/symptoms of physical distress      Intensity   THRR 40-80% of Max Heartrate 118-139    Ratings of Perceived Exertion 11-13    Perceived Dyspnea 0-4      Progression   Progression Continue to progress workloads to maintain  intensity without signs/symptoms of physical distress.      Resistance Training   Training Prescription Yes    Weight 2 lb    Reps 10-15             Perform Capillary Blood Glucose checks as needed.  Exercise Prescription Changes:   Exercise Prescription Changes     Row Name 01/04/23 1600 01/19/23 0800 02/01/23 1600 02/14/23 1300       Response to Exercise   Blood Pressure (Admit) 100/62 102/60 110/62 102/60    Blood Pressure (Exercise) 112/70 104/58 118/68 122/82    Blood Pressure (Exit) 106/60 104/60 102/58 98/60    Heart Rate (Admit) 97 bpm 90 bpm 79 bpm 65 bpm    Heart Rate (Exercise) 113 bpm 106 bpm 109 bpm 113 bpm    Heart Rate (Exit) 84 bpm 93 bpm 90 bpm 82 bpm    Oxygen Saturation (Admit) 99 % -- -- --    Oxygen Saturation (Exercise) 99 % -- -- --    Oxygen Saturation (Exit) 99 % -- -- --    Rating of Perceived Exertion (Exercise) 7 13 12 13     Perceived Dyspnea (Exercise) 0 -- 0 0    Symptoms none none none none    Comments results First two days of exercise -- --    Duration -- Progress to 30 minutes of  aerobic without signs/symptoms of physical distress Progress to 30 minutes of  aerobic without signs/symptoms of physical distress Progress to 30 minutes of  aerobic without signs/symptoms of physical distress    Intensity -- THRR unchanged THRR unchanged THRR unchanged      Progression   Progression Continue to progress workloads to maintain intensity without signs/symptoms of physical distress. Continue to progress workloads to maintain intensity without signs/symptoms of physical distress. Continue to progress workloads to maintain intensity without signs/symptoms of physical distress. Continue to progress workloads to maintain intensity without signs/symptoms of physical distress.    Average METs 3 2.91 2.47 2.37      Resistance Training   Training Prescription -- Yes Yes Yes    Weight -- 2 lb 2 lb 2 lb    Reps -- 10-15 10-15 10-15      Interval  Training   Interval Training -- No No No      Treadmill   MPH -- 2.6 2.4 2    Grade -- 0 0 0    Minutes -- 15 15 15     METs -- 2.99 2.84 2.53      Recumbant Bike   Level -- 1.2 -- 1.2    Watts -- 22 -- 22    Minutes -- 15 -- 15    METs -- 3.14 -- 3.14      NuStep   Level -- 3 1 2     Minutes -- 15 15 15     METs -- 2.6 2.1 2.2      Oxygen   Maintain Oxygen Saturation -- 88% or higher 88% or higher 88% or higher             Exercise Comments:   Exercise Comments     Row Name 01/08/23 1109           Exercise Comments First full day of exercise!  Patient was oriented to gym and equipment including functions, settings, policies, and procedures.  Patient's individual exercise prescription and treatment plan were reviewed.  All starting workloads were established based on the results of the 6 minute walk test done at initial orientation visit.  The plan for exercise progression was also introduced and progression will be customized based on patient's performance and goals.                Exercise Goals and Review:   Exercise Goals     Row Name 01/04/23 1645             Exercise Goals   Increase Physical Activity Yes       Intervention Provide advice, education, support and counseling about physical activity/exercise needs.;Develop an individualized exercise prescription for aerobic and resistive training based on initial evaluation findings, risk stratification, comorbidities and participant's personal goals.       Expected Outcomes Short Term: Attend rehab on a regular basis to increase amount of physical activity.;Long Term: Exercising regularly at least 3-5 days a week.;Long Term: Add in home exercise to make exercise part of routine and to increase amount of physical activity.       Increase Strength and Stamina Yes       Intervention Provide advice, education, support and counseling about physical activity/exercise needs.;Develop an individualized exercise  prescription for aerobic and resistive training based on initial evaluation findings, risk stratification, comorbidities and participant's personal goals.       Expected Outcomes Short Term: Increase workloads from initial exercise prescription for resistance, speed, and METs.;Short Term: Perform resistance training exercises routinely during rehab and add in resistance training at home;Long Term: Improve cardiorespiratory fitness, muscular endurance and strength as measured  by increased METs and functional capacity ( )       Able to understand and use rate of perceived exertion (RPE) scale Yes       Intervention Provide education and explanation on how to use RPE scale       Expected Outcomes Short Term: Able to use RPE daily in rehab to express subjective intensity level;Long Term:  Able to use RPE to guide intensity level when exercising independently       Able to understand and use Dyspnea scale Yes       Intervention Provide education and explanation on how to use Dyspnea scale       Expected Outcomes Short Term: Able to use Dyspnea scale daily in rehab to express subjective sense of shortness of breath during exertion;Long Term: Able to use Dyspnea scale to guide intensity level when exercising independently       Knowledge and understanding of Target Heart Rate Range (THRR) Yes       Intervention Provide education and explanation of THRR including how the numbers were predicted and where they are located for reference       Expected Outcomes Short Term: Able to state/look up THRR;Long Term: Able to use THRR to govern intensity when exercising independently;Short Term: Able to use daily as guideline for intensity in rehab       Able to check pulse independently Yes       Intervention Provide education and demonstration on how to check pulse in carotid and radial arteries.;Review the importance of being able to check your own pulse for safety during independent exercise       Expected Outcomes  Short Term: Able to explain why pulse checking is important during independent exercise;Long Term: Able to check pulse independently and accurately       Understanding of Exercise Prescription Yes       Intervention Provide education, explanation, and written materials on patient's individual exercise prescription       Expected Outcomes Short Term: Able to explain program exercise prescription;Long Term: Able to explain home exercise prescription to exercise independently                Exercise Goals Re-Evaluation :  Exercise Goals Re-Evaluation     Row Name 01/08/23 1109 01/19/23 0824 02/01/23 1652 02/14/23 1356       Exercise Goal Re-Evaluation   Exercise Goals Review Able to understand and use rate of perceived exertion (RPE) scale;Knowledge and understanding of Target Heart Rate Range (THRR);Able to understand and use Dyspnea scale;Understanding of Exercise Prescription Increase Physical Activity;Increase Strength and Stamina;Understanding of Exercise Prescription Increase Physical Activity;Increase Strength and Stamina;Understanding of Exercise Prescription Increase Physical Activity;Increase Strength and Stamina;Understanding of Exercise Prescription    Comments Reviewed RPE  and dyspnea scale, THR and program prescription with pt today.  Pt voiced understanding and was given a copy of goals to take home. Ciji is off to a good start in the program. She did well on the treadmill at a speed of 2.6 mph with no incline. She also worked at level 3 on the T4 nustep and level 1.2 on the recumbent bike. She did well with 2 lb hand weights for resistance training during her first two sessions as well. We will continue to monitor her progress. Ardes has only attended one session during this review but continues to do well in rehab. She was able to maintain her realtive intensity on the treadmill, and used the T4 nustep at level 1.  We will continue to monitor her progress in the program. Jayle  continues to do well in rehab. She has recently increased her level on the T4 nustep form level 1 to 2. She has also been able to maintain her relative intensity on the recumbent bike and treadmill. We will continue to monitor her progress in the program.    Expected Outcomes Short: Use RPE daily to regulate intensity. Long: Follow program prescription in THR. Short: Continue to follow current exercise prescription. Long: Continue exercise to increase strength and stamina. Short: Continue to follow current exercise prescription. Long: Continue exercise to increase strength and stamina. Short: Continue to follow current exercise prescription. Long: Continue exercise to increase strength and stamina.             Discharge Exercise Prescription (Final Exercise Prescription Changes):  Exercise Prescription Changes - 02/14/23 1300       Response to Exercise   Blood Pressure (Admit) 102/60    Blood Pressure (Exercise) 122/82    Blood Pressure (Exit) 98/60    Heart Rate (Admit) 65 bpm    Heart Rate (Exercise) 113 bpm    Heart Rate (Exit) 82 bpm    Rating of Perceived Exertion (Exercise) 13    Perceived Dyspnea (Exercise) 0    Symptoms none    Duration Progress to 30 minutes of  aerobic without signs/symptoms of physical distress    Intensity THRR unchanged      Progression   Progression Continue to progress workloads to maintain intensity without signs/symptoms of physical distress.    Average METs 2.37      Resistance Training   Training Prescription Yes    Weight 2 lb    Reps 10-15      Interval Training   Interval Training No      Treadmill   MPH 2    Grade 0    Minutes 15    METs 2.53      Recumbant Bike   Level 1.2    Watts 22    Minutes 15    METs 3.14      NuStep   Level 2    Minutes 15    METs 2.2      Oxygen   Maintain Oxygen Saturation 88% or higher             Nutrition:  Target Goals: Understanding of nutrition guidelines, daily intake of sodium  1500mg , cholesterol 200mg , calories 30% from fat and 7% or less from saturated fats, daily to have 5 or more servings of fruits and vegetables.  Education: All About Nutrition: -Group instruction provided by verbal, written material, interactive activities, discussions, models, and posters to present general guidelines for heart healthy nutrition including fat, fiber, MyPlate, the role of sodium in heart healthy nutrition, utilization of the nutrition label, and utilization of this knowledge for meal planning. Follow up email sent as well. Written material given at graduation. Flowsheet Row Cardiac Rehab from 01/04/2023 in [facility] Cardiac and Pulmonary Rehab  Education need identified 01/04/23       Biometrics:  Pre Biometrics - 01/04/23 1645       Pre Biometrics   Height 5\' 4"  (1.626 m)    Weight 134 lb 11.2 oz (61.1 kg)    Waist Circumference 37.7 inches    Hip Circumference 40.5 inches    Waist to Hip Ratio 0.93 %    BMI (Calculated) 23.11    Single Leg Stand 3 seconds  Nutrition Therapy Plan and Nutrition Goals:  Nutrition Therapy & Goals - 01/08/23 1355       Nutrition Therapy   Diet Carb controlled, Cardiac, low na    Protein (specify units) 90    Fiber 25 grams    Whole Grain Foods 3 servings    Saturated Fats 15 max. grams    Fruits and Vegetables 5 servings/day    Sodium 2 grams      Personal Nutrition Goals   Nutrition Goal Use healthy fats to promote heart health and support calorie intake.    Personal Goal #2 Eat a protein and a controlled portion of carbs at each meal    Personal Goal #3 Watch saturated fat intake, aim for less than ~15g per day on average    Comments Patient drinks mostly water, sometimes zevia. She reports she gets full quickly, uses small snacks and meal replacement shake often to help her get enough nutrition . She doesn't want to lose weight, feels she could gain some weight even. Encouraged her to continue to eat smaller but  more frequent meals, focus in nutrient dense foods over empty calories. She has T2DM, A1C improving, currently at 7.0%. Spoke to her about carb sources, portions, and goal of eating ~30-60g per meal, ~15-30g per snack. Encouraged fibrous, complex carbs like whole grains, legumes, and lentils. Reviewed mediterranean diet handout, types of fats, their sources, how to read label. Her meal replacement was 400kcal, 27gFat, 17gSat fat, 13gCHO, 9gFiber, 27gPro. Spoke to her about how the protein content was good, low in sodium and good calorie count, but that the high saturated fat intake was on concern, than looking for lower sat fat alternatives would be a good idea. She seemed to prefer this meal replacement over trying others at this time. Encouraged her to watch saturated fat intake outside of this meal replacement as a goal to prevent excessive sat fat intake. Also recommended she add a fruit to it as well, since it was low in carbs and almost all of it was fiber.      Intervention Plan   Intervention Prescribe, educate and counsel regarding individualized specific dietary modifications aiming towards targeted core components such as weight, hypertension, lipid management, diabetes, heart failure and other comorbidities.;Nutrition handout(s) given to patient.    Expected Outcomes Short Term Goal: Understand basic principles of dietary content, such as calories, fat, sodium, cholesterol and nutrients.;Short Term Goal: A plan has been developed with personal nutrition goals set during dietitian appointment.;Long Term Goal: Adherence to prescribed nutrition plan.             Nutrition Assessments:  MEDIFICTS Score Key: >=70 Need to make dietary changes  40-70 Heart Healthy Diet <= 40 Therapeutic Level Cholesterol Diet  Flowsheet Row Cardiac Rehab from 01/04/2023 in [facility] Cardiac and Pulmonary Rehab  Picture Your Plate Total Score on Admission 54      Picture Your Plate Scores: <16 Unhealthy dietary  pattern with much room for improvement. 41-50 Dietary pattern unlikely to meet recommendations for good health and room for improvement. 51-60 More healthful dietary pattern, with some room for improvement.  >60 Healthy dietary pattern, although there may be some specific behaviors that could be improved.    Nutrition Goals Re-Evaluation:   Nutrition Goals Discharge (Final Nutrition Goals Re-Evaluation):   Psychosocial: Target Goals: Acknowledge presence or absence of significant depression and/or stress, maximize coping skills, provide positive support system. Participant is able to verbalize types and ability to use techniques and  skills needed for reducing stress and depression.   Education: Stress, Anxiety, and Depression - Group verbal and visual presentation to define topics covered.  Reviews how body is impacted by stress, anxiety, and depression.  Also discusses healthy ways to reduce stress and to treat/manage anxiety and depression.  Written material given at graduation.   Education: Sleep Hygiene -Provides group verbal and written instruction about how sleep can affect your health.  Define sleep hygiene, discuss sleep cycles and impact of sleep habits. Review good sleep hygiene tips.    Initial Review & Psychosocial Screening:  Initial Psych Review & Screening - 12/27/22 1436       Initial Review   Current issues with None Identified      Family Dynamics   Good Support System? Yes   family, friends     Barriers   Psychosocial barriers to participate in program The patient should benefit from training in stress management and relaxation.      Screening Interventions   Interventions To provide support and resources with identified psychosocial needs;Provide feedback about the scores to participant;Encouraged to exercise    Expected Outcomes Short Term goal: Utilizing psychosocial counselor, staff and physician to assist with identification of specific Stressors or  current issues interfering with healing process. Setting desired goal for each stressor or current issue identified.;Long Term Goal: Stressors or current issues are controlled or eliminated.;Short Term goal: Identification and review with participant of any Quality of Life or Depression concerns found by scoring the questionnaire.;Long Term goal: The participant improves quality of Life and PHQ9 Scores as seen by post scores and/or verbalization of changes             Quality of Life Scores:   Quality of Life - 01/04/23 1646       Quality of Life   Select Quality of Life      Quality of Life Scores   Health/Function Pre 27.4 %    Socioeconomic Pre 23.25 %    Psych/Spiritual Pre 29.64 %    Family Pre 30 %    GLOBAL Pre 27.27 %            Scores of 19 and below usually indicate a poorer quality of life in these areas.  A difference of  2-3 points is a clinically meaningful difference.  A difference of 2-3 points in the total score of the Quality of Life Index has been associated with significant improvement in overall quality of life, self-image, physical symptoms, and general health in studies assessing change in quality of life.  PHQ-9: Review Flowsheet  More data exists      01/04/2023 01/03/2023 10/10/2022 08/21/2022 01/11/2022  Depression screen PHQ 2/9  Decreased Interest 0 0 0 0 0  Down, Depressed, Hopeless 0 0 0 0 0  PHQ - 2 Score 0 0 0 0 0  Altered sleeping 0 0 0 0 -  Tired, decreased energy 0 0 0 0 -  Change in appetite 0 0 0 0 -  Feeling bad or failure about yourself  0 0 0 0 -  Trouble concentrating 0 0 0 0 -  Moving slowly or fidgety/restless 0 - 0 0 -  Suicidal thoughts 0 0 0 0 -  PHQ-9 Score 0 0 0 0 -  Difficult doing work/chores Not difficult at all Not difficult at all Not difficult at all Not difficult at all -    Details           Interpretation of  Total Score  Total Score Depression Severity:  1-4 = Minimal depression, 5-9 = Mild depression, 10-14 =  Moderate depression, 15-19 = Moderately severe depression, 20-27 = Severe depression   Psychosocial Evaluation and Intervention:  Psychosocial Evaluation - 12/27/22 1451       Psychosocial Evaluation & Interventions   Comments Cherri has no barriers to attending the program. She lives with her husband, son and daughter.  They and her friends are her support. She is alredy walking about a mile a day in her home and has permission form her doctor to increase her distance. She is wearing a LIFE VEST.  She has lost about 50 pounds in the past 2 years. 10 pounds during her recent hospitalization. She does not want to lose more weight. She is ready to get started.    Expected Outcomes STG She attends all scheduled sessions, she works with the EP staff to progress her exercise back to her level prior to the NSTEMI. She will continue to monitor her weight loss. LTG continue to manage her esercise progression and her weight after discharge    Continue Psychosocial Services  Follow up required by staff             Psychosocial Re-Evaluation:  Psychosocial Re-Evaluation     Row Name 02/26/23 [phone]             Psychosocial Re-Evaluation   Current issues with Current Stress Concerns       Comments Ayen reports that she is doing well and enjyoing the program. When asked about stress, she mentioned that with flu season coming it does add some extra stress. She gets worried when there are a bunch of sicknesses around. She wants to continue coming to the program as long as she can, but mentions she will exercise at home if there a lot of cases of sicknesses. She is sleeping well and continues to have a great support system.       Expected Outcomes Short: attend cardiac rehab for exercise and education. Long: independently manage heart healthy habits       Interventions Encouraged to attend Cardiac Rehabilitation for the exercise;Stress management education       Continue Psychosocial Services  Follow  up required by staff                Psychosocial Discharge (Final Psychosocial Re-Evaluation):  Psychosocial Re-Evaluation - 02/26/23 0949       Psychosocial Re-Evaluation   Current issues with Current Stress Concerns    Comments Eiman reports that she is doing well and enjyoing the program. When asked about stress, she mentioned that with flu season coming it does add some extra stress. She gets worried when there are a bunch of sicknesses around. She wants to continue coming to the program as long as she can, but mentions she will exercise at home if there a lot of cases of sicknesses. She is sleeping well and continues to have a great support system.    Expected Outcomes Short: attend cardiac rehab for exercise and education. Long: independently manage heart healthy habits    Interventions Encouraged to attend Cardiac Rehabilitation for the exercise;Stress management education    Continue Psychosocial Services  Follow up required by staff             Vocational Rehabilitation: Provide vocational rehab assistance to qualifying candidates.   Vocational Rehab Evaluation & Intervention:  Vocational Rehab - 12/27/22 1446       Initial Vocational  Rehab Evaluation & Intervention   Assessment shows need for Vocational Rehabilitation No      Vocational Rehab Re-Evaulation   Comments retired             Education: Education Goals: Education classes will be provided on a variety of topics geared toward better understanding of heart health and risk factor modification. Participant will state understanding/return demonstration of topics presented as noted by education test scores.  Learning Barriers/Preferences:   General Cardiac Education Topics:  AED/CPR: - Group verbal and written instruction with the use of models to demonstrate the basic use of the AED with the basic ABC's of resuscitation.   Anatomy and Cardiac Procedures: - Group verbal and visual presentation  and models provide information about basic cardiac anatomy and function. Reviews the testing methods done to diagnose heart disease and the outcomes of the test results. Describes the treatment choices: Medical Management, Angioplasty, or Coronary Bypass Surgery for treating various heart conditions including Myocardial Infarction, Angina, Valve Disease, and Cardiac Arrhythmias.  Written material given at graduation. Flowsheet Row Cardiac Rehab from 01/04/2023 in [facility] Card[facility] Rehab  Education need identified 01/04/23       Medication Safety: - Group verbal and visual instruction to review commonly prescribed medications for heart and lung disease. Reviews the medication, class of the drug, and side effects. Includes the steps to properly store meds and maintain the prescription regimen.  Written material given at graduation.   Intimacy: - Group verbal instruction through game format to discuss how heart and lung disease can affect sexual intimacy. Written material given at graduation..   Know Your Numbers and Heart Failure: - Group verbal and visual instruction to discuss disease risk factors for cardiac and pulmonary disease and treatment options.  Reviews associated critical values for Overweight/Obesity, Hypertension, Cholesterol, and Diabetes.  Discusses basics of heart failure: signs/symptoms and treatments.  Introduces Heart Failure Zone chart for action plan for heart failure.  Written material given at graduation. Flowsheet Row Cardiac Rehab from 01/04/2023 in [facility] Cardiac and Pulmonary Rehab  Education need identified 01/04/23       Infection Prevention: - Provides verbal and written material to individual with discussion of infection control including proper hand washing and proper equipment cleaning during exercise session. Flowsheet Row Cardiac Rehab from 01/04/2023 in [facility] Cardiac and Pulmonary Rehab  Date 01/04/23  Educator MB  Instruction Review Code 1- Verbalizes  Understanding       Falls Prevention: - Provides verbal and written material to individual with discussion of falls prevention and safety. Flowsheet Row Cardiac Rehab from 01/04/2023 in [facility] Cardiac and Pulmonary Rehab  Date 01/04/23  Educator MB  Instruction Review Code 1- Verbalizes Understanding       Other: -Provides group and verbal instruction on various topics (see comments)   Knowledge Questionnaire Score:  Knowledge Questionnaire Score - 01/04/23 1648       Knowledge Questionnaire Score   Pre Score 22/26             Core Components/Risk Factors/Patient Goals at Admission:  Personal Goals and Risk Factors at Admission - 01/04/23 1648       Core Components/Risk Factors/Patient Goals on Admission    Weight Management Yes;Weight Gain    Intervention Weight Management: Develop a combined nutrition and exercise program designed to reach desired caloric intake, while maintaining appropriate intake of nutrient and fiber, sodium and fats, and appropriate energy expenditure required for the weight goal.;Weight Management: Provide education and appropriate resources  to help participant work on and attain dietary goals.;Weight Management/Obesity: Establish reasonable short term and long term weight goals.    Admit Weight 134 lb 11.2 oz (61.1 kg)    Goal Weight: Short Term 140 lb (63.5 kg)    Goal Weight: Long Term 140 lb (63.5 kg)    Expected Outcomes Short Term: Continue to assess and modify interventions until short term weight is achieved;Long Term: Adherence to nutrition and physical activity/exercise program aimed toward attainment of established weight goal;Weight Gain: Understanding of general recommendations for a high calorie, high protein meal plan that promotes weight gain by distributing calorie intake throughout the day with the consumption for 4-5 meals, snacks, and/or supplements;Understanding recommendations for meals to include 15-35% energy as protein, 25-35%  energy from fat, 35-60% energy from carbohydrates, less than 200mg  of dietary cholesterol, 20-35 gm of total fiber daily;Understanding of distribution of calorie intake throughout the day with the consumption of 4-5 meals/snacks    Diabetes Yes    Intervention Provide education about signs/symptoms and action to take for hypo/hyperglycemia.;Provide education about proper nutrition, including hydration, and aerobic/resistive exercise prescription along with prescribed medications to achieve blood glucose in normal ranges: Fasting glucose 65-99 mg/dL    Expected Outcomes Short Term: Participant verbalizes understanding of the signs/symptoms and immediate care of hyper/hypoglycemia, proper foot care and importance of medication, aerobic/resistive exercise and nutrition plan for blood glucose control.;Long Term: Attainment of HbA1C < 7%.    Heart Failure Yes    Intervention Provide a combined exercise and nutrition program that is supplemented with education, support and counseling about heart failure. Directed toward relieving symptoms such as shortness of breath, decreased exercise tolerance, and extremity edema.    Expected Outcomes Improve functional capacity of life;Short term: Attendance in program 2-3 days a week with increased exercise capacity. Reported lower sodium intake. Reported increased fruit and vegetable intake. Reports medication compliance.;Short term: Daily weights obtained and reported for increase. Utilizing diuretic protocols set by physician.;Long term: Adoption of self-care skills and reduction of barriers for early signs and symptoms recognition and intervention leading to self-care maintenance.    Hypertension Yes    Intervention Provide education on lifestyle modifcations including regular physical activity/exercise, weight management, moderate sodium restriction and increased consumption of fresh fruit, vegetables, and low fat dairy, alcohol moderation, and smoking cessation.;Monitor  prescription use compliance.    Expected Outcomes Short Term: Continued assessment and intervention until BP is < 140/48mm HG in hypertensive participants. < 130/88mm HG in hypertensive participants with diabetes, heart failure or chronic kidney disease.;Long Term: Maintenance of blood pressure at goal levels.             Education:Diabetes - Individual verbal and written instruction to review signs/symptoms of diabetes, desired ranges of glucose level fasting, after meals and with exercise. Acknowledge that pre and post exercise glucose checks will be done for 3 sessions at entry of program. Flowsheet Row Cardiac Rehab from 01/04/2023 in [facility] Cardiac and Pulmonary Rehab  Date 01/04/23  Educator MB  Instruction Review Code 1- Verbalizes Understanding       Core Components/Risk Factors/Patient Goals Review:   Goals and Risk Factor Review     Row Name 02/26/23 0944             Core Components/Risk Factors/Patient Goals Review   Personal Goals Review Heart Failure;Weight Management/Obesity       Review Helma is enjoying the program so far. She is still wearing her Life Vest. They are planning an echo  11/18 and hopes to get it off. Her weight is stable and wants to keep it around 135lb. They decreased her monjaro and her A1C is doing well.       Expected Outcomes Short: attend cardiac rehab for weight maitenance and heart failure management Long: independently manage risk factors and increase stamina                Core Components/Risk Factors/Patient Goals at Discharge (Final Review):   Goals and Risk Factor Review - 02/26/23 0944       Core Components/Risk Factors/Patient Goals Review   Personal Goals Review Heart Failure;Weight Management/Obesity    Review Myia is enjoying the program so far. She is still wearing her Life Vest. They are planning an echo 11/18 and hopes to get it off. Her weight is stable and wants to keep it around 135lb. They decreased her monjaro and  her A1C is doing well.    Expected Outcomes Short: attend cardiac rehab for weight maitenance and heart failure management Long: independently manage risk factors and increase stamina             ITP Comments:  ITP Comments     Row Name 12/27/22 1447 01/04/23 1639 01/08/23 1109 01/31/23 1016 02/28/23 1242   ITP Comments Virtual orientation call completed today. shehas an appointment on Date: 01/04/2023  for EP eval and gym Orientation.  Documentation of diagnosis can be found in [facility] 12/12/2022 . Completed and gym orientation. Initial ITP created and sent for review to Bethann Punches, Medical Director. First full day of exercise!  Patient was oriented to gym and equipment including functions, settings, policies, and procedures.  Patient's individual exercise prescription and treatment plan were reviewed.  All starting workloads were established based on the results of the 6 minute walk test done at initial orientation visit.  The plan for exercise progression was also introduced and progression will be customized based on patient's performance and goals. 30 Day review completed. Medical Director ITP review done, changes made as directed, and signed approval by Medical Director.    new to program Recent hospitalization for angina.  Meds increased to pre NSTEMI level.            Comments:

## 2023-02-28 NOTE — Progress Notes (Signed)
Cardiac Individual Treatment Plan  Patient Details  Name: Whitney Knight MRN: [id number] Date of Birth: [birth year] Referring Provider:   Flowsheet Row Cardiac Rehab from 01/04/2023 in [facility] Cardiac and Pulmonary Rehab  Referring Provider Dr. Herbie Baltimore       Initial Encounter Date:  Flowsheet Row Cardiac Rehab from 01/04/2023 in [facility] Cardiac and Pulmonary Rehab  Date 01/04/23       Visit Diagnosis: NSTEMI (non-ST elevation myocardial infarction) [facility])  Status post coronary artery stent placement  Patient's Home Medications on Admission:  Current Outpatient Medications:    aspirin EC 81 MG tablet, Take 1 tablet (81 mg total) by mouth daily. Swallow whole., Disp: , Rfl:    blood glucose meter kit and supplies KIT, One touch ultra mini. Use 2-4 times per day., Disp: 1 each, Rfl: 0   candesartan (ATACAND) 4 MG tablet, Take 0.5 tablets (2 mg total) by mouth daily. (Patient not taking: Reported on 01/11/2023), Disp: 15 tablet, Rfl: 5   carvedilol (COREG) 6.25 MG tablet, Take 0.5 tablets (3.125 mg total) by mouth 2 (two) times daily with a meal., Disp: 60 tablet, Rfl: 3   cetirizine (ZYRTEC) 10 MG chewable tablet, Chew 10 mg by mouth daily., Disp: , Rfl:    Continuous Glucose Receiver (DEXCOM G7 RECEIVER) DEVI, Use to check glucose at least every 8 hours., Disp: 1 each, Rfl: 0   Continuous Glucose Sensor (DEXCOM G7 SENSOR) MISC, Apply every 10 days, Disp: 3 each, Rfl: 3   dapagliflozin propanediol (FARXIGA) 10 MG TABS tablet, Take 1 tablet (10 mg total) by mouth daily., Disp: 90 tablet, Rfl: 1   fluticasone (FLONASE) 50 MCG/ACT nasal spray, USE TWO SPRAYS IN EACH NOSTRIL DAILY, Disp: 16 g, Rfl: 3   Lancets (ONETOUCH DELICA PLUS LANCET33G) MISC, USE ONCE DAILY AS DIRECTED, Disp: 100 each, Rfl: 1   levOCARNitine (L-CARNITINE PO), Take 2 tablets by mouth daily at 6 (six) AM., Disp: , Rfl:    levothyroxine (SYNTHROID) 75 MCG tablet, TAKE 1 TABLET BY MOUTH DAILY, Disp: 90 tablet, Rfl: 1   ONETOUCH  ULTRA test strip, USE TO CHECK BLOOD SUGAR UP TO THREE TIMES DAILY, Disp: 400 strip, Rfl: 1   saccharomyces boulardii (FLORASTOR) 250 MG capsule, Take 1 capsule (250 mg total) by mouth daily., Disp: 90 capsule, Rfl: 0   spironolactone (ALDACTONE) 25 MG tablet, Take 0.5 tablets (12.5 mg total) by mouth daily., Disp: 45 tablet, Rfl: 1   ticagrelor (BRILINTA) 90 MG TABS tablet, Take 1 tablet (90 mg total) by mouth 2 (two) times daily., Disp: 180 tablet, Rfl: 1   tirzepatide (MOUNJARO) 12.5 MG/0.5ML Pen, Inject 12.5 mg into the skin once a week., Disp: 6 mL, Rfl: 2  Past Medical History: Past Medical History:  Diagnosis Date   Allergy    Anemia    Arthritis    KNEES   Cataract    Chronic sinusitis    Cough    Diabetes mellitus    Diverticulosis    GERD (gastroesophageal reflux disease)    Headache(784.0)    [facility] in past   Hypertension    Osteoporosis    Thyroid disease     Tobacco Use: Social History   Tobacco Use  Smoking Status Never  Smokeless Tobacco Never    Labs: Review Flowsheet  More data exists      Latest Ref Rng & Units 01/11/2022 05/18/2022 08/21/2022 12/14/2022 01/22/2023  Labs for ITP Cardiac and Pulmonary Rehab  Cholestrol 0 - 200 mg/dL - 102  -  141  -  LDL (calc) 0 - 99 mg/dL - 161  - 87  -  HDL-C >09 mg/dL - 60.45  - 36  -  Trlycerides <150 mg/dL - 409.8  - 91  -  Hemoglobin A1c 4.8 - 5.6 % 7.1  7.4  7.0  - 6.6   PH, Arterial 7.35 - 7.45 - - - 7.493  -  PCO2 arterial 32 - 48 mmHg - - - 39.8  -  Bicarbonate 20.0 - 28.0 mmol/L - - - 31.7  30.5  -  TCO2 22 - 32 mmol/L - - - 33  32  -  O2 Saturation % - - - 68  92  -    Details       Multiple values from one day are sorted in reverse-chronological order          Exercise Target Goals: Exercise Program Goal: Individual exercise prescription set using results from initial 6 min walk test and THRR while considering  patient's activity barriers and safety.   Exercise Prescription  Goal: Initial exercise prescription builds to 30-45 minutes a day of aerobic activity, 2-3 days per week.  Home exercise guidelines will be given to patient during program as part of exercise prescription that the participant will acknowledge.   Education: Aerobic Exercise: - Group verbal and visual presentation on the components of exercise prescription. Introduces F.I.T.T principle from ACSM for exercise prescriptions.  Reviews F.I.T.T. principles of aerobic exercise including progression. Written material given at graduation. Flowsheet Row Cardiac Rehab from 01/04/2023 in [facility] Cardiac and Pulmonary Rehab  Education need identified 01/04/23       Education: Resistance Exercise: - Group verbal and visual presentation on the components of exercise prescription. Introduces F.I.T.T principle from ACSM for exercise prescriptions  Reviews F.I.T.T. principles of resistance exercise including progression. Written material given at graduation.    Education: Exercise & Equipment Safety: - Individual verbal instruction and demonstration of equipment use and safety with use of the equipment. Flowsheet Row Cardiac Rehab from 01/04/2023 in [facility] Cardiac and Pulmonary Rehab  Date 01/04/23  Educator MB  Instruction Review Code 1- Verbalizes Understanding       Education: Exercise Physiology & General Exercise Guidelines: - Group verbal and written instruction with models to review the exercise physiology of the cardiovascular system and associated critical values. Provides general exercise guidelines with specific guidelines to those with heart or lung disease.    Education: Flexibility, Balance, Mind/Body Relaxation: - Group verbal and visual presentation with interactive activity on the components of exercise prescription. Introduces F.I.T.T principle from ACSM for exercise prescriptions. Reviews F.I.T.T. principles of flexibility and balance exercise training including progression. Also discusses the  mind body connection.  Reviews various relaxation techniques to help reduce and manage stress (i.e. Deep breathing, progressive muscle relaxation, and visualization). Balance handout provided to take home. Written material given at graduation.   Activity Barriers & Risk Stratification:  Activity Barriers & Cardiac Risk Stratification - 01/04/23 1651       Activity Barriers & Cardiac Risk Stratification   Activity Barriers None    Cardiac Risk Stratification Moderate             6 Minute Walk:  6 Minute Walk     Row Name 01/04/23 1639         6 Minute Walk   Phase Initial     Distance 1380 feet     Walk Time 6 minutes     # of Rest  Breaks 0     MPH 2.61     METS 3.18     RPE 7     Perceived Dyspnea  0     VO2 Peak 11.13     Symptoms No     Resting HR 97 bpm     Resting BP 100/62     Resting Oxygen Saturation  99 %     Exercise Oxygen Saturation  during 6 min walk 99 %     Max Ex. HR 113 bpm     Max Ex. BP 112/70     2 Minute Post BP 106/60              Oxygen Initial Assessment:   Oxygen Re-Evaluation:   Oxygen Discharge (Final Oxygen Re-Evaluation):   Initial Exercise Prescription:  Initial Exercise Prescription - 01/04/23 1600       Date of Initial Exercise RX and Referring Provider   Date 01/04/23    Referring Provider Dr. Herbie Baltimore      Oxygen   Maintain Oxygen Saturation 88% or higher      Treadmill   MPH 2.6    Grade 0    Minutes 15    METs 3      Recumbant Bike   Level 3    RPM 50    Watts 22    Minutes 15    METs 3      NuStep   Level 3    SPM 80    Minutes 15    METs 3      Prescription Details   Frequency (times per week) 2    Duration Progress to 30 minutes of continuous aerobic without signs/symptoms of physical distress      Intensity   THRR 40-80% of Max Heartrate 118-139    Ratings of Perceived Exertion 11-13    Perceived Dyspnea 0-4      Progression   Progression Continue to progress workloads to maintain  intensity without signs/symptoms of physical distress.      Resistance Training   Training Prescription Yes    Weight 2 lb    Reps 10-15             Perform Capillary Blood Glucose checks as needed.  Exercise Prescription Changes:   Exercise Prescription Changes     Row Name 01/04/23 1600 01/19/23 0800 02/01/23 1600 02/14/23 1300       Response to Exercise   Blood Pressure (Admit) 100/62 102/60 110/62 102/60    Blood Pressure (Exercise) 112/70 104/58 118/68 122/82    Blood Pressure (Exit) 106/60 104/60 102/58 98/60    Heart Rate (Admit) 97 bpm 90 bpm 79 bpm 65 bpm    Heart Rate (Exercise) 113 bpm 106 bpm 109 bpm 113 bpm    Heart Rate (Exit) 84 bpm 93 bpm 90 bpm 82 bpm    Oxygen Saturation (Admit) 99 % -- -- --    Oxygen Saturation (Exercise) 99 % -- -- --    Oxygen Saturation (Exit) 99 % -- -- --    Rating of Perceived Exertion (Exercise) 7 13 12 13     Perceived Dyspnea (Exercise) 0 -- 0 0    Symptoms none none none none    Comments results First two days of exercise -- --    Duration -- Progress to 30 minutes of  aerobic without signs/symptoms of physical distress Progress to 30 minutes of  aerobic without signs/symptoms of physical distress Progress to 30 minutes of  aerobic without signs/symptoms of physical distress    Intensity -- THRR unchanged THRR unchanged THRR unchanged      Progression   Progression Continue to progress workloads to maintain intensity without signs/symptoms of physical distress. Continue to progress workloads to maintain intensity without signs/symptoms of physical distress. Continue to progress workloads to maintain intensity without signs/symptoms of physical distress. Continue to progress workloads to maintain intensity without signs/symptoms of physical distress.    Average METs 3 2.91 2.47 2.37      Resistance Training   Training Prescription -- Yes Yes Yes    Weight -- 2 lb 2 lb 2 lb    Reps -- 10-15 10-15 10-15      Interval  Training   Interval Training -- No No No      Treadmill   MPH -- 2.6 2.4 2    Grade -- 0 0 0    Minutes -- 15 15 15     METs -- 2.99 2.84 2.53      Recumbant Bike   Level -- 1.2 -- 1.2    Watts -- 22 -- 22    Minutes -- 15 -- 15    METs -- 3.14 -- 3.14      NuStep   Level -- 3 1 2     Minutes -- 15 15 15     METs -- 2.6 2.1 2.2      Oxygen   Maintain Oxygen Saturation -- 88% or higher 88% or higher 88% or higher             Exercise Comments:   Exercise Comments     Row Name 01/08/23 1109           Exercise Comments First full day of exercise!  Patient was oriented to gym and equipment including functions, settings, policies, and procedures.  Patient's individual exercise prescription and treatment plan were reviewed.  All starting workloads were established based on the results of the 6 minute walk test done at initial orientation visit.  The plan for exercise progression was also introduced and progression will be customized based on patient's performance and goals.                Exercise Goals and Review:   Exercise Goals     Row Name 01/04/23 1645             Exercise Goals   Increase Physical Activity Yes       Intervention Provide advice, education, support and counseling about physical activity/exercise needs.;Develop an individualized exercise prescription for aerobic and resistive training based on initial evaluation findings, risk stratification, comorbidities and participant's personal goals.       Expected Outcomes Short Term: Attend rehab on a regular basis to increase amount of physical activity.;Long Term: Exercising regularly at least 3-5 days a week.;Long Term: Add in home exercise to make exercise part of routine and to increase amount of physical activity.       Increase Strength and Stamina Yes       Intervention Provide advice, education, support and counseling about physical activity/exercise needs.;Develop an individualized exercise  prescription for aerobic and resistive training based on initial evaluation findings, risk stratification, comorbidities and participant's personal goals.       Expected Outcomes Short Term: Increase workloads from initial exercise prescription for resistance, speed, and METs.;Short Term: Perform resistance training exercises routinely during rehab and add in resistance training at home;Long Term: Improve cardiorespiratory fitness, muscular endurance and strength as measured  by increased METs and functional capacity ( )       Able to understand and use rate of perceived exertion (RPE) scale Yes       Intervention Provide education and explanation on how to use RPE scale       Expected Outcomes Short Term: Able to use RPE daily in rehab to express subjective intensity level;Long Term:  Able to use RPE to guide intensity level when exercising independently       Able to understand and use Dyspnea scale Yes       Intervention Provide education and explanation on how to use Dyspnea scale       Expected Outcomes Short Term: Able to use Dyspnea scale daily in rehab to express subjective sense of shortness of breath during exertion;Long Term: Able to use Dyspnea scale to guide intensity level when exercising independently       Knowledge and understanding of Target Heart Rate Range (THRR) Yes       Intervention Provide education and explanation of THRR including how the numbers were predicted and where they are located for reference       Expected Outcomes Short Term: Able to state/look up THRR;Long Term: Able to use THRR to govern intensity when exercising independently;Short Term: Able to use daily as guideline for intensity in rehab       Able to check pulse independently Yes       Intervention Provide education and demonstration on how to check pulse in carotid and radial arteries.;Review the importance of being able to check your own pulse for safety during independent exercise       Expected Outcomes  Short Term: Able to explain why pulse checking is important during independent exercise;Long Term: Able to check pulse independently and accurately       Understanding of Exercise Prescription Yes       Intervention Provide education, explanation, and written materials on patient's individual exercise prescription       Expected Outcomes Short Term: Able to explain program exercise prescription;Long Term: Able to explain home exercise prescription to exercise independently                Exercise Goals Re-Evaluation :  Exercise Goals Re-Evaluation     Row Name 01/08/23 1109 01/19/23 0824 02/01/23 1652 02/14/23 1356       Exercise Goal Re-Evaluation   Exercise Goals Review Able to understand and use rate of perceived exertion (RPE) scale;Knowledge and understanding of Target Heart Rate Range (THRR);Able to understand and use Dyspnea scale;Understanding of Exercise Prescription Increase Physical Activity;Increase Strength and Stamina;Understanding of Exercise Prescription Increase Physical Activity;Increase Strength and Stamina;Understanding of Exercise Prescription Increase Physical Activity;Increase Strength and Stamina;Understanding of Exercise Prescription    Comments Reviewed RPE  and dyspnea scale, THR and program prescription with pt today.  Pt voiced understanding and was given a copy of goals to take home. Sherlita is off to a good start in the program. She did well on the treadmill at a speed of 2.6 mph with no incline. She also worked at level 3 on the T4 nustep and level 1.2 on the recumbent bike. She did well with 2 lb hand weights for resistance training during her first two sessions as well. We will continue to monitor her progress. Makylie has only attended one session during this review but continues to do well in rehab. She was able to maintain her realtive intensity on the treadmill, and used the T4 nustep at level 1.  We will continue to monitor her progress in the program. Jeydy  continues to do well in rehab. She has recently increased her level on the T4 nustep form level 1 to 2. She has also been able to maintain her relative intensity on the recumbent bike and treadmill. We will continue to monitor her progress in the program.    Expected Outcomes Short: Use RPE daily to regulate intensity. Long: Follow program prescription in THR. Short: Continue to follow current exercise prescription. Long: Continue exercise to increase strength and stamina. Short: Continue to follow current exercise prescription. Long: Continue exercise to increase strength and stamina. Short: Continue to follow current exercise prescription. Long: Continue exercise to increase strength and stamina.             Discharge Exercise Prescription (Final Exercise Prescription Changes):  Exercise Prescription Changes - 02/14/23 1300       Response to Exercise   Blood Pressure (Admit) 102/60    Blood Pressure (Exercise) 122/82    Blood Pressure (Exit) 98/60    Heart Rate (Admit) 65 bpm    Heart Rate (Exercise) 113 bpm    Heart Rate (Exit) 82 bpm    Rating of Perceived Exertion (Exercise) 13    Perceived Dyspnea (Exercise) 0    Symptoms none    Duration Progress to 30 minutes of  aerobic without signs/symptoms of physical distress    Intensity THRR unchanged      Progression   Progression Continue to progress workloads to maintain intensity without signs/symptoms of physical distress.    Average METs 2.37      Resistance Training   Training Prescription Yes    Weight 2 lb    Reps 10-15      Interval Training   Interval Training No      Treadmill   MPH 2    Grade 0    Minutes 15    METs 2.53      Recumbant Bike   Level 1.2    Watts 22    Minutes 15    METs 3.14      NuStep   Level 2    Minutes 15    METs 2.2      Oxygen   Maintain Oxygen Saturation 88% or higher             Nutrition:  Target Goals: Understanding of nutrition guidelines, daily intake of sodium  1500mg , cholesterol 200mg , calories 30% from fat and 7% or less from saturated fats, daily to have 5 or more servings of fruits and vegetables.  Education: All About Nutrition: -Group instruction provided by verbal, written material, interactive activities, discussions, models, and posters to present general guidelines for heart healthy nutrition including fat, fiber, MyPlate, the role of sodium in heart healthy nutrition, utilization of the nutrition label, and utilization of this knowledge for meal planning. Follow up email sent as well. Written material given at graduation. Flowsheet Row Cardiac Rehab from 01/04/2023 in [facility] Cardiac and Pulmonary Rehab  Education need identified 01/04/23       Biometrics:  Pre Biometrics - 01/04/23 1645       Pre Biometrics   Height 5\' 4"  (1.626 m)    Weight 134 lb 11.2 oz (61.1 kg)    Waist Circumference 37.7 inches    Hip Circumference 40.5 inches    Waist to Hip Ratio 0.93 %    BMI (Calculated) 23.11    Single Leg Stand 3 seconds  Nutrition Therapy Plan and Nutrition Goals:  Nutrition Therapy & Goals - 01/08/23 1355       Nutrition Therapy   Diet Carb controlled, Cardiac, low na    Protein (specify units) 90    Fiber 25 grams    Whole Grain Foods 3 servings    Saturated Fats 15 max. grams    Fruits and Vegetables 5 servings/day    Sodium 2 grams      Personal Nutrition Goals   Nutrition Goal Use healthy fats to promote heart health and support calorie intake.    Personal Goal #2 Eat a protein and a controlled portion of carbs at each meal    Personal Goal #3 Watch saturated fat intake, aim for less than ~15g per day on average    Comments Patient drinks mostly water, sometimes zevia. She reports she gets full quickly, uses small snacks and meal replacement shake often to help her get enough nutrition . She doesn't want to lose weight, feels she could gain some weight even. Encouraged her to continue to eat smaller but  more frequent meals, focus in nutrient dense foods over empty calories. She has T2DM, A1C improving, currently at 7.0%. Spoke to her about carb sources, portions, and goal of eating ~30-60g per meal, ~15-30g per snack. Encouraged fibrous, complex carbs like whole grains, legumes, and lentils. Reviewed mediterranean diet handout, types of fats, their sources, how to read label. Her meal replacement was 400kcal, 27gFat, 17gSat fat, 13gCHO, 9gFiber, 27gPro. Spoke to her about how the protein content was good, low in sodium and good calorie count, but that the high saturated fat intake was on concern, than looking for lower sat fat alternatives would be a good idea. She seemed to prefer this meal replacement over trying others at this time. Encouraged her to watch saturated fat intake outside of this meal replacement as a goal to prevent excessive sat fat intake. Also recommended she add a fruit to it as well, since it was low in carbs and almost all of it was fiber.      Intervention Plan   Intervention Prescribe, educate and counsel regarding individualized specific dietary modifications aiming towards targeted core components such as weight, hypertension, lipid management, diabetes, heart failure and other comorbidities.;Nutrition handout(s) given to patient.    Expected Outcomes Short Term Goal: Understand basic principles of dietary content, such as calories, fat, sodium, cholesterol and nutrients.;Short Term Goal: A plan has been developed with personal nutrition goals set during dietitian appointment.;Long Term Goal: Adherence to prescribed nutrition plan.             Nutrition Assessments:  MEDIFICTS Score Key: >=70 Need to make dietary changes  40-70 Heart Healthy Diet <= 40 Therapeutic Level Cholesterol Diet  Flowsheet Row Cardiac Rehab from 01/04/2023 in [facility] Cardiac and Pulmonary Rehab  Picture Your Plate Total Score on Admission 54      Picture Your Plate Scores: <16 Unhealthy dietary  pattern with much room for improvement. 41-50 Dietary pattern unlikely to meet recommendations for good health and room for improvement. 51-60 More healthful dietary pattern, with some room for improvement.  >60 Healthy dietary pattern, although there may be some specific behaviors that could be improved.    Nutrition Goals Re-Evaluation:   Nutrition Goals Discharge (Final Nutrition Goals Re-Evaluation):   Psychosocial: Target Goals: Acknowledge presence or absence of significant depression and/or stress, maximize coping skills, provide positive support system. Participant is able to verbalize types and ability to use techniques and  skills needed for reducing stress and depression.   Education: Stress, Anxiety, and Depression - Group verbal and visual presentation to define topics covered.  Reviews how body is impacted by stress, anxiety, and depression.  Also discusses healthy ways to reduce stress and to treat/manage anxiety and depression.  Written material given at graduation.   Education: Sleep Hygiene -Provides group verbal and written instruction about how sleep can affect your health.  Define sleep hygiene, discuss sleep cycles and impact of sleep habits. Review good sleep hygiene tips.    Initial Review & Psychosocial Screening:  Initial Psych Review & Screening - 12/27/22 1436       Initial Review   Current issues with None Identified      Family Dynamics   Good Support System? Yes   family, friends     Barriers   Psychosocial barriers to participate in program The patient should benefit from training in stress management and relaxation.      Screening Interventions   Interventions To provide support and resources with identified psychosocial needs;Provide feedback about the scores to participant;Encouraged to exercise    Expected Outcomes Short Term goal: Utilizing psychosocial counselor, staff and physician to assist with identification of specific Stressors or  current issues interfering with healing process. Setting desired goal for each stressor or current issue identified.;Long Term Goal: Stressors or current issues are controlled or eliminated.;Short Term goal: Identification and review with participant of any Quality of Life or Depression concerns found by scoring the questionnaire.;Long Term goal: The participant improves quality of Life and PHQ9 Scores as seen by post scores and/or verbalization of changes             Quality of Life Scores:   Quality of Life - 01/04/23 1646       Quality of Life   Select Quality of Life      Quality of Life Scores   Health/Function Pre 27.4 %    Socioeconomic Pre 23.25 %    Psych/Spiritual Pre 29.64 %    Family Pre 30 %    GLOBAL Pre 27.27 %            Scores of 19 and below usually indicate a poorer quality of life in these areas.  A difference of  2-3 points is a clinically meaningful difference.  A difference of 2-3 points in the total score of the Quality of Life Index has been associated with significant improvement in overall quality of life, self-image, physical symptoms, and general health in studies assessing change in quality of life.  PHQ-9: Review Flowsheet  More data exists      01/04/2023 01/03/2023 10/10/2022 08/21/2022 01/11/2022  Depression screen PHQ 2/9  Decreased Interest 0 0 0 0 0  Down, Depressed, Hopeless 0 0 0 0 0  PHQ - 2 Score 0 0 0 0 0  Altered sleeping 0 0 0 0 -  Tired, decreased energy 0 0 0 0 -  Change in appetite 0 0 0 0 -  Feeling bad or failure about yourself  0 0 0 0 -  Trouble concentrating 0 0 0 0 -  Moving slowly or fidgety/restless 0 - 0 0 -  Suicidal thoughts 0 0 0 0 -  PHQ-9 Score 0 0 0 0 -  Difficult doing work/chores Not difficult at all Not difficult at all Not difficult at all Not difficult at all -    Details           Interpretation of  Total Score  Total Score Depression Severity:  1-4 = Minimal depression, 5-9 = Mild depression, 10-14 =  Moderate depression, 15-19 = Moderately severe depression, 20-27 = Severe depression   Psychosocial Evaluation and Intervention:  Psychosocial Evaluation - 12/27/22 1451       Psychosocial Evaluation & Interventions   Comments Linzie has no barriers to attending the program. She lives with her husband, son and daughter.  They and her friends are her support. She is alredy walking about a mile a day in her home and has permission form her doctor to increase her distance. She is wearing a LIFE VEST.  She has lost about 50 pounds in the past 2 years. 10 pounds during her recent hospitalization. She does not want to lose more weight. She is ready to get started.    Expected Outcomes STG She attends all scheduled sessions, she works with the EP staff to progress her exercise back to her level prior to the NSTEMI. She will continue to monitor her weight loss. LTG continue to manage her esercise progression and her weight after discharge    Continue Psychosocial Services  Follow up required by staff             Psychosocial Re-Evaluation:  Psychosocial Re-Evaluation     Row Name 02/26/23 [phone]             Psychosocial Re-Evaluation   Current issues with Current Stress Concerns       Comments Willene reports that she is doing well and enjyoing the program. When asked about stress, she mentioned that with flu season coming it does add some extra stress. She gets worried when there are a bunch of sicknesses around. She wants to continue coming to the program as long as she can, but mentions she will exercise at home if there a lot of cases of sicknesses. She is sleeping well and continues to have a great support system.       Expected Outcomes Short: attend cardiac rehab for exercise and education. Long: independently manage heart healthy habits       Interventions Encouraged to attend Cardiac Rehabilitation for the exercise;Stress management education       Continue Psychosocial Services  Follow  up required by staff                Psychosocial Discharge (Final Psychosocial Re-Evaluation):  Psychosocial Re-Evaluation - 02/26/23 0949       Psychosocial Re-Evaluation   Current issues with Current Stress Concerns    Comments Aalliyah reports that she is doing well and enjyoing the program. When asked about stress, she mentioned that with flu season coming it does add some extra stress. She gets worried when there are a bunch of sicknesses around. She wants to continue coming to the program as long as she can, but mentions she will exercise at home if there a lot of cases of sicknesses. She is sleeping well and continues to have a great support system.    Expected Outcomes Short: attend cardiac rehab for exercise and education. Long: independently manage heart healthy habits    Interventions Encouraged to attend Cardiac Rehabilitation for the exercise;Stress management education    Continue Psychosocial Services  Follow up required by staff             Vocational Rehabilitation: Provide vocational rehab assistance to qualifying candidates.   Vocational Rehab Evaluation & Intervention:  Vocational Rehab - 12/27/22 1446       Initial Vocational  Rehab Evaluation & Intervention   Assessment shows need for Vocational Rehabilitation No      Vocational Rehab Re-Evaulation   Comments retired             Education: Education Goals: Education classes will be provided on a variety of topics geared toward better understanding of heart health and risk factor modification. Participant will state understanding/return demonstration of topics presented as noted by education test scores.  Learning Barriers/Preferences:   General Cardiac Education Topics:  AED/CPR: - Group verbal and written instruction with the use of models to demonstrate the basic use of the AED with the basic ABC's of resuscitation.   Anatomy and Cardiac Procedures: - Group verbal and visual presentation  and models provide information about basic cardiac anatomy and function. Reviews the testing methods done to diagnose heart disease and the outcomes of the test results. Describes the treatment choices: Medical Management, Angioplasty, or Coronary Bypass Surgery for treating various heart conditions including Myocardial Infarction, Angina, Valve Disease, and Cardiac Arrhythmias.  Written material given at graduation. Flowsheet Row Cardiac Rehab from 01/04/2023 in [facility] Cardiac and Pulmonary Rehab  Education need identified 01/04/23       Medication Safety: - Group verbal and visual instruction to review commonly prescribed medications for heart and lung disease. Reviews the medication, class of the drug, and side effects. Includes the steps to properly store meds and maintain the prescription regimen.  Written material given at graduation.   Intimacy: - Group verbal instruction through game format to discuss how heart and lung disease can affect sexual intimacy. Written material given at graduation..   Know Your Numbers and Heart Failure: - Group verbal and visual instruction to discuss disease risk factors for cardiac and pulmonary disease and treatment options.  Reviews associated critical values for Overweight/Obesity, Hypertension, Cholesterol, and Diabetes.  Discusses basics of heart failure: signs/symptoms and treatments.  Introduces Heart Failure Zone chart for action plan for heart failure.  Written material given at graduation. Flowsheet Row Cardiac Rehab from 01/04/2023 in [facility] Cardiac and Pulmonary Rehab  Education need identified 01/04/23       Infection Prevention: - Provides verbal and written material to individual with discussion of infection control including proper hand washing and proper equipment cleaning during exercise session. Flowsheet Row Cardiac Rehab from 01/04/2023 in [facility] Cardiac and Pulmonary Rehab  Date 01/04/23  Educator MB  Instruction Review Code 1- Verbalizes  Understanding       Falls Prevention: - Provides verbal and written material to individual with discussion of falls prevention and safety. Flowsheet Row Cardiac Rehab from 01/04/2023 in [facility] Cardiac and Pulmonary Rehab  Date 01/04/23  Educator MB  Instruction Review Code 1- Verbalizes Understanding       Other: -Provides group and verbal instruction on various topics (see comments)   Knowledge Questionnaire Score:  Knowledge Questionnaire Score - 01/04/23 1648       Knowledge Questionnaire Score   Pre Score 22/26             Core Components/Risk Factors/Patient Goals at Admission:  Personal Goals and Risk Factors at Admission - 01/04/23 1648       Core Components/Risk Factors/Patient Goals on Admission    Weight Management Yes;Weight Gain    Intervention Weight Management: Develop a combined nutrition and exercise program designed to reach desired caloric intake, while maintaining appropriate intake of nutrient and fiber, sodium and fats, and appropriate energy expenditure required for the weight goal.;Weight Management: Provide education and appropriate resources  to help participant work on and attain dietary goals.;Weight Management/Obesity: Establish reasonable short term and long term weight goals.    Admit Weight 134 lb 11.2 oz (61.1 kg)    Goal Weight: Short Term 140 lb (63.5 kg)    Goal Weight: Long Term 140 lb (63.5 kg)    Expected Outcomes Short Term: Continue to assess and modify interventions until short term weight is achieved;Long Term: Adherence to nutrition and physical activity/exercise program aimed toward attainment of established weight goal;Weight Gain: Understanding of general recommendations for a high calorie, high protein meal plan that promotes weight gain by distributing calorie intake throughout the day with the consumption for 4-5 meals, snacks, and/or supplements;Understanding recommendations for meals to include 15-35% energy as protein, 25-35%  energy from fat, 35-60% energy from carbohydrates, less than 200mg  of dietary cholesterol, 20-35 gm of total fiber daily;Understanding of distribution of calorie intake throughout the day with the consumption of 4-5 meals/snacks    Diabetes Yes    Intervention Provide education about signs/symptoms and action to take for hypo/hyperglycemia.;Provide education about proper nutrition, including hydration, and aerobic/resistive exercise prescription along with prescribed medications to achieve blood glucose in normal ranges: Fasting glucose 65-99 mg/dL    Expected Outcomes Short Term: Participant verbalizes understanding of the signs/symptoms and immediate care of hyper/hypoglycemia, proper foot care and importance of medication, aerobic/resistive exercise and nutrition plan for blood glucose control.;Long Term: Attainment of HbA1C < 7%.    Heart Failure Yes    Intervention Provide a combined exercise and nutrition program that is supplemented with education, support and counseling about heart failure. Directed toward relieving symptoms such as shortness of breath, decreased exercise tolerance, and extremity edema.    Expected Outcomes Improve functional capacity of life;Short term: Attendance in program 2-3 days a week with increased exercise capacity. Reported lower sodium intake. Reported increased fruit and vegetable intake. Reports medication compliance.;Short term: Daily weights obtained and reported for increase. Utilizing diuretic protocols set by physician.;Long term: Adoption of self-care skills and reduction of barriers for early signs and symptoms recognition and intervention leading to self-care maintenance.    Hypertension Yes    Intervention Provide education on lifestyle modifcations including regular physical activity/exercise, weight management, moderate sodium restriction and increased consumption of fresh fruit, vegetables, and low fat dairy, alcohol moderation, and smoking cessation.;Monitor  prescription use compliance.    Expected Outcomes Short Term: Continued assessment and intervention until BP is < 140/35mm HG in hypertensive participants. < 130/75mm HG in hypertensive participants with diabetes, heart failure or chronic kidney disease.;Long Term: Maintenance of blood pressure at goal levels.             Education:Diabetes - Individual verbal and written instruction to review signs/symptoms of diabetes, desired ranges of glucose level fasting, after meals and with exercise. Acknowledge that pre and post exercise glucose checks will be done for 3 sessions at entry of program. Flowsheet Row Cardiac Rehab from 01/04/2023 in [facility] Cardiac and Pulmonary Rehab  Date 01/04/23  Educator MB  Instruction Review Code 1- Verbalizes Understanding       Core Components/Risk Factors/Patient Goals Review:   Goals and Risk Factor Review     Row Name 02/26/23 0944             Core Components/Risk Factors/Patient Goals Review   Personal Goals Review Heart Failure;Weight Management/Obesity       Review Myani is enjoying the program so far. She is still wearing her Life Vest. They are planning an echo  11/18 and hopes to get it off. Her weight is stable and wants to keep it around 135lb. They decreased her monjaro and her A1C is doing well.       Expected Outcomes Short: attend cardiac rehab for weight maitenance and heart failure management Long: independently manage risk factors and increase stamina                Core Components/Risk Factors/Patient Goals at Discharge (Final Review):   Goals and Risk Factor Review - 02/26/23 0944       Core Components/Risk Factors/Patient Goals Review   Personal Goals Review Heart Failure;Weight Management/Obesity    Review Madeleyn is enjoying the program so far. She is still wearing her Life Vest. They are planning an echo 11/18 and hopes to get it off. Her weight is stable and wants to keep it around 135lb. They decreased her monjaro and  her A1C is doing well.    Expected Outcomes Short: attend cardiac rehab for weight maitenance and heart failure management Long: independently manage risk factors and increase stamina             ITP Comments:  ITP Comments     Row Name 12/27/22 1447 01/04/23 1639 01/08/23 1109 01/31/23 1016 02/28/23 1242   ITP Comments Virtual orientation call completed today. shehas an appointment on Date: 01/04/2023  for EP eval and gym Orientation.  Documentation of diagnosis can be found in [facility] 12/12/2022 . Completed and gym orientation. Initial ITP created and sent for review to Bethann Punches, Medical Director. First full day of exercise!  Patient was oriented to gym and equipment including functions, settings, policies, and procedures.  Patient's individual exercise prescription and treatment plan were reviewed.  All starting workloads were established based on the results of the 6 minute walk test done at initial orientation visit.  The plan for exercise progression was also introduced and progression will be customized based on patient's performance and goals. 30 Day review completed. Medical Director ITP review done, changes made as directed, and signed approval by Medical Director.    new to program Recent hospitalization for angina.  Meds increased to pre NSTEMI level.    Row Name 02/28/23 1248           ITP Comments 30 Day review completed. Medical Director ITP review done, changes made as directed, and signed approval by Medical Director.                Comments:

## 2023-03-01 ENCOUNTER — Ambulatory Visit: Payer: Medicare Other

## 2023-03-02 ENCOUNTER — Other Ambulatory Visit: Payer: Self-pay | Admitting: Family Medicine

## 2023-03-02 DIAGNOSIS — I10 Essential (primary) hypertension: Secondary | ICD-10-CM

## 2023-03-02 NOTE — Telephone Encounter (Signed)
Please contact the patient and see if she has been taking amlodipine.  It looks like this was discontinued during her hospitalization in August.  Thanks.

## 2023-03-02 NOTE — Telephone Encounter (Signed)
Rx was discontinued on 12/16/2022 at discharge. Is it okay to refuse?

## 2023-03-06 ENCOUNTER — Ambulatory Visit: Payer: Medicare Other

## 2023-03-07 ENCOUNTER — Encounter: Payer: Medicare Other | Attending: Cardiology

## 2023-03-08 ENCOUNTER — Encounter: Payer: Self-pay | Admitting: *Deleted

## 2023-03-08 ENCOUNTER — Encounter: Payer: Medicare Other | Admitting: Internal Medicine

## 2023-03-08 ENCOUNTER — Ambulatory Visit: Payer: Medicare Other

## 2023-03-08 DIAGNOSIS — I214 Non-ST elevation (NSTEMI) myocardial infarction: Secondary | ICD-10-CM

## 2023-03-08 DIAGNOSIS — Z955 Presence of coronary angioplasty implant and graft: Secondary | ICD-10-CM

## 2023-03-08 NOTE — Progress Notes (Signed)
Discharge Summary:  Whitney Knight  (DOB: [birth year])  Angelique Blonder requested to discharge early from cardiac rehab. She will continue to exercise at home. She completed 13/36 sessions.    6 Minute Walk     Row Name 01/04/23 1639         6 Minute Walk   Phase Initial     Distance 1380 feet     Walk Time 6 minutes     # of Rest Breaks 0     MPH 2.61     METS 3.18     RPE 7     Perceived Dyspnea  0     VO2 Peak 11.13     Symptoms No     Resting HR 97 bpm     Resting BP 100/62     Resting Oxygen Saturation  99 %     Exercise Oxygen Saturation  during 6 min walk 99 %     Max Ex. HR 113 bpm     Max Ex. BP 112/70     2 Minute Post BP 106/60

## 2023-03-08 NOTE — Progress Notes (Signed)
Cardiac Individual Treatment Plan  Patient Details  Name: Whitney Knight MRN: [id number] Date of Birth: 12/05/1950 Referring Provider:   Flowsheet Row Cardiac Rehab from 01/04/2023 in [facility] Cardiac and Pulmonary Rehab  Referring Provider Dr. Herbie Baltimore       Initial Encounter Date:  Flowsheet Row Cardiac Rehab from 01/04/2023 in [facility] Cardiac and Pulmonary Rehab  Date 01/04/23       Visit Diagnosis: NSTEMI (non-ST elevation myocardial infarction) [facility])  Status post coronary artery stent placement  Patient's Home Medications on Admission:  Current Outpatient Medications:    aspirin EC 81 MG tablet, Take 1 tablet (81 mg total) by mouth daily. Swallow whole., Disp: , Rfl:    blood glucose meter kit and supplies KIT, One touch ultra mini. Use 2-4 times per day., Disp: 1 each, Rfl: 0   candesartan (ATACAND) 4 MG tablet, Take 0.5 tablets (2 mg total) by mouth daily. (Patient not taking: Reported on 01/11/2023), Disp: 15 tablet, Rfl: 5   carvedilol (COREG) 6.25 MG tablet, Take 0.5 tablets (3.125 mg total) by mouth 2 (two) times daily with a meal., Disp: 60 tablet, Rfl: 3   cetirizine (ZYRTEC) 10 MG chewable tablet, Chew 10 mg by mouth daily., Disp: , Rfl:    Continuous Glucose Receiver (DEXCOM G7 RECEIVER) DEVI, Use to check glucose at least every 8 hours., Disp: 1 each, Rfl: 0   Continuous Glucose Sensor (DEXCOM G7 SENSOR) MISC, Apply every 10 days, Disp: 3 each, Rfl: 3   dapagliflozin propanediol (FARXIGA) 10 MG TABS tablet, Take 1 tablet (10 mg total) by mouth daily., Disp: 90 tablet, Rfl: 1   fluticasone (FLONASE) 50 MCG/ACT nasal spray, USE TWO SPRAYS IN EACH NOSTRIL DAILY, Disp: 16 g, Rfl: 3   Lancets (ONETOUCH DELICA PLUS LANCET33G) MISC, USE ONCE DAILY AS DIRECTED, Disp: 100 each, Rfl: 1   levOCARNitine (L-CARNITINE PO), Take 2 tablets by mouth daily at 6 (six) AM., Disp: , Rfl:    levothyroxine (SYNTHROID) 75 MCG tablet, TAKE 1 TABLET BY MOUTH DAILY, Disp: 90 tablet, Rfl: 1   ONETOUCH  ULTRA test strip, USE TO CHECK BLOOD SUGAR UP TO THREE TIMES DAILY, Disp: 400 strip, Rfl: 1   saccharomyces boulardii (FLORASTOR) 250 MG capsule, Take 1 capsule (250 mg total) by mouth daily., Disp: 90 capsule, Rfl: 0   spironolactone (ALDACTONE) 25 MG tablet, Take 0.5 tablets (12.5 mg total) by mouth daily., Disp: 45 tablet, Rfl: 1   ticagrelor (BRILINTA) 90 MG TABS tablet, Take 1 tablet (90 mg total) by mouth 2 (two) times daily., Disp: 180 tablet, Rfl: 1   tirzepatide (MOUNJARO) 12.5 MG/0.5ML Pen, Inject 12.5 mg into the skin once a week., Disp: 6 mL, Rfl: 2  Past Medical History: Past Medical History:  Diagnosis Date   Allergy    Anemia    Arthritis    KNEES   Cataract    Chronic sinusitis    Cough    Diabetes mellitus    Diverticulosis    GERD (gastroesophageal reflux disease)    Headache(784.0)    [facility] in past   Hypertension    Osteoporosis    Thyroid disease     Tobacco Use: Social History   Tobacco Use  Smoking Status Never  Smokeless Tobacco Never    Labs: Review Flowsheet  More data exists      Latest Ref Rng & Units 01/11/2022 05/18/2022 08/21/2022 12/14/2022 01/22/2023  Labs for ITP Cardiac and Pulmonary Rehab  Cholestrol 0 - 200 mg/dL - 409  -  141  -  LDL (calc) 0 - 99 mg/dL - 782  - 87  -  HDL-C >95 mg/dL - 62.13  - 36  -  Trlycerides <150 mg/dL - 086.5  - 91  -  Hemoglobin A1c 4.8 - 5.6 % 7.1  7.4  7.0  - 6.6   PH, Arterial 7.35 - 7.45 - - - 7.493  -  PCO2 arterial 32 - 48 mmHg - - - 39.8  -  Bicarbonate 20.0 - 28.0 mmol/L - - - 31.7  30.5  -  TCO2 22 - 32 mmol/L - - - 33  32  -  O2 Saturation % - - - 68  92  -    Details       Multiple values from one day are sorted in reverse-chronological order          Exercise Target Goals: Exercise Program Goal: Individual exercise prescription set using results from initial 6 min walk test and THRR while considering  patient's activity barriers and safety.   Exercise Prescription  Goal: Initial exercise prescription builds to 30-45 minutes a day of aerobic activity, 2-3 days per week.  Home exercise guidelines will be given to patient during program as part of exercise prescription that the participant will acknowledge.   Education: Aerobic Exercise: - Group verbal and visual presentation on the components of exercise prescription. Introduces F.I.T.T principle from ACSM for exercise prescriptions.  Reviews F.I.T.T. principles of aerobic exercise including progression. Written material given at graduation. Flowsheet Row Cardiac Rehab from 01/04/2023 in [facility] Cardiac and Pulmonary Rehab  Education need identified 01/04/23       Education: Resistance Exercise: - Group verbal and visual presentation on the components of exercise prescription. Introduces F.I.T.T principle from ACSM for exercise prescriptions  Reviews F.I.T.T. principles of resistance exercise including progression. Written material given at graduation.    Education: Exercise & Equipment Safety: - Individual verbal instruction and demonstration of equipment use and safety with use of the equipment. Flowsheet Row Cardiac Rehab from 01/04/2023 in [facility] Cardiac and Pulmonary Rehab  Date 01/04/23  Educator MB  Instruction Review Code 1- Verbalizes Understanding       Education: Exercise Physiology & General Exercise Guidelines: - Group verbal and written instruction with models to review the exercise physiology of the cardiovascular system and associated critical values. Provides general exercise guidelines with specific guidelines to those with heart or lung disease.    Education: Flexibility, Balance, Mind/Body Relaxation: - Group verbal and visual presentation with interactive activity on the components of exercise prescription. Introduces F.I.T.T principle from ACSM for exercise prescriptions. Reviews F.I.T.T. principles of flexibility and balance exercise training including progression. Also discusses the  mind body connection.  Reviews various relaxation techniques to help reduce and manage stress (i.e. Deep breathing, progressive muscle relaxation, and visualization). Balance handout provided to take home. Written material given at graduation.   Activity Barriers & Risk Stratification:  Activity Barriers & Cardiac Risk Stratification - 01/04/23 1651       Activity Barriers & Cardiac Risk Stratification   Activity Barriers None    Cardiac Risk Stratification Moderate             6 Minute Walk:  6 Minute Walk     Row Name 01/04/23 1639         6 Minute Walk   Phase Initial     Distance 1380 feet     Walk Time 6 minutes     # of Rest  Breaks 0     MPH 2.61     METS 3.18     RPE 7     Perceived Dyspnea  0     VO2 Peak 11.13     Symptoms No     Resting HR 97 bpm     Resting BP 100/62     Resting Oxygen Saturation  99 %     Exercise Oxygen Saturation  during 6 min walk 99 %     Max Ex. HR 113 bpm     Max Ex. BP 112/70     2 Minute Post BP 106/60              Oxygen Initial Assessment:   Oxygen Re-Evaluation:   Oxygen Discharge (Final Oxygen Re-Evaluation):   Initial Exercise Prescription:  Initial Exercise Prescription - 01/04/23 1600       Date of Initial Exercise RX and Referring Provider   Date 01/04/23    Referring Provider Dr. Herbie Baltimore      Oxygen   Maintain Oxygen Saturation 88% or higher      Treadmill   MPH 2.6    Grade 0    Minutes 15    METs 3      Recumbant Bike   Level 3    RPM 50    Watts 22    Minutes 15    METs 3      NuStep   Level 3    SPM 80    Minutes 15    METs 3      Prescription Details   Frequency (times per week) 2    Duration Progress to 30 minutes of continuous aerobic without signs/symptoms of physical distress      Intensity   THRR 40-80% of Max Heartrate 118-139    Ratings of Perceived Exertion 11-13    Perceived Dyspnea 0-4      Progression   Progression Continue to progress workloads to maintain  intensity without signs/symptoms of physical distress.      Resistance Training   Training Prescription Yes    Weight 2 lb    Reps 10-15             Perform Capillary Blood Glucose checks as needed.  Exercise Prescription Changes:   Exercise Prescription Changes     Row Name 01/04/23 1600 01/19/23 0800 02/01/23 1600 02/14/23 1300 03/01/23 1600     Response to Exercise   Blood Pressure (Admit) 100/62 102/60 110/62 102/60 122/82   Blood Pressure (Exercise) 112/70 104/58 118/68 122/82 114/56   Blood Pressure (Exit) 106/60 104/60 102/58 98/60 122/82   Heart Rate (Admit) 97 bpm 90 bpm 79 bpm 65 bpm 83 bpm   Heart Rate (Exercise) 113 bpm 106 bpm 109 bpm 113 bpm 107 bpm   Heart Rate (Exit) 84 bpm 93 bpm 90 bpm 82 bpm 84 bpm   Oxygen Saturation (Admit) 99 % -- -- -- --   Oxygen Saturation (Exercise) 99 % -- -- -- --   Oxygen Saturation (Exit) 99 % -- -- -- --   Rating of Perceived Exertion (Exercise) 7 13 12 13 13    Perceived Dyspnea (Exercise) 0 -- 0 0 0   Symptoms none none none none none   Comments results First two days of exercise -- -- --   Duration -- Progress to 30 minutes of  aerobic without signs/symptoms of physical distress Progress to 30 minutes of  aerobic without signs/symptoms of physical distress Progress to  30 minutes of  aerobic without signs/symptoms of physical distress Progress to 30 minutes of  aerobic without signs/symptoms of physical distress   Intensity -- THRR unchanged THRR unchanged THRR unchanged THRR unchanged     Progression   Progression Continue to progress workloads to maintain intensity without signs/symptoms of physical distress. Continue to progress workloads to maintain intensity without signs/symptoms of physical distress. Continue to progress workloads to maintain intensity without signs/symptoms of physical distress. Continue to progress workloads to maintain intensity without signs/symptoms of physical distress. Continue to progress  workloads to maintain intensity without signs/symptoms of physical distress.   Average METs 3 2.91 2.47 2.37 2.19     Resistance Training   Training Prescription -- Yes Yes Yes Yes   Weight -- 2 lb 2 lb 2 lb 2 lb   Reps -- 10-15 10-15 10-15 10-15     Interval Training   Interval Training -- No No No No     Treadmill   MPH -- 2.6 2.4 2 2.3   Grade -- 0 0 0 0   Minutes -- 15 15 15 15    METs -- 2.99 2.84 2.53 2.76     Recumbant Bike   Level -- 1.2 -- 1.2 --   Watts -- 22 -- 22 --   Minutes -- 15 -- 15 --   METs -- 3.14 -- 3.14 --     NuStep   Level -- 3 1 2 6    Minutes -- 15 15 15 15    METs -- 2.6 2.1 2.2 1.5     Oxygen   Maintain Oxygen Saturation -- 88% or higher 88% or higher 88% or higher 88% or higher            Exercise Comments:   Exercise Comments     Row Name 01/08/23 1109           Exercise Comments First full day of exercise!  Patient was oriented to gym and equipment including functions, settings, policies, and procedures.  Patient's individual exercise prescription and treatment plan were reviewed.  All starting workloads were established based on the results of the 6 minute walk test done at initial orientation visit.  The plan for exercise progression was also introduced and progression will be customized based on patient's performance and goals.                Exercise Goals and Review:   Exercise Goals     Row Name 01/04/23 1645             Exercise Goals   Increase Physical Activity Yes       Intervention Provide advice, education, support and counseling about physical activity/exercise needs.;Develop an individualized exercise prescription for aerobic and resistive training based on initial evaluation findings, risk stratification, comorbidities and participant's personal goals.       Expected Outcomes Short Term: Attend rehab on a regular basis to increase amount of physical activity.;Long Term: Exercising regularly at least 3-5 days  a week.;Long Term: Add in home exercise to make exercise part of routine and to increase amount of physical activity.       Increase Strength and Stamina Yes       Intervention Provide advice, education, support and counseling about physical activity/exercise needs.;Develop an individualized exercise prescription for aerobic and resistive training based on initial evaluation findings, risk stratification, comorbidities and participant's personal goals.       Expected Outcomes Short Term: Increase workloads from initial exercise prescription  for resistance, speed, and METs.;Short Term: Perform resistance training exercises routinely during rehab and add in resistance training at home;Long Term: Improve cardiorespiratory fitness, muscular endurance and strength as measured by increased METs and functional capacity ( )       Able to understand and use rate of perceived exertion (RPE) scale Yes       Intervention Provide education and explanation on how to use RPE scale       Expected Outcomes Short Term: Able to use RPE daily in rehab to express subjective intensity level;Long Term:  Able to use RPE to guide intensity level when exercising independently       Able to understand and use Dyspnea scale Yes       Intervention Provide education and explanation on how to use Dyspnea scale       Expected Outcomes Short Term: Able to use Dyspnea scale daily in rehab to express subjective sense of shortness of breath during exertion;Long Term: Able to use Dyspnea scale to guide intensity level when exercising independently       Knowledge and understanding of Target Heart Rate Range (THRR) Yes       Intervention Provide education and explanation of THRR including how the numbers were predicted and where they are located for reference       Expected Outcomes Short Term: Able to state/look up THRR;Long Term: Able to use THRR to govern intensity when exercising independently;Short Term: Able to use daily as  guideline for intensity in rehab       Able to check pulse independently Yes       Intervention Provide education and demonstration on how to check pulse in carotid and radial arteries.;Review the importance of being able to check your own pulse for safety during independent exercise       Expected Outcomes Short Term: Able to explain why pulse checking is important during independent exercise;Long Term: Able to check pulse independently and accurately       Understanding of Exercise Prescription Yes       Intervention Provide education, explanation, and written materials on patient's individual exercise prescription       Expected Outcomes Short Term: Able to explain program exercise prescription;Long Term: Able to explain home exercise prescription to exercise independently                Exercise Goals Re-Evaluation :  Exercise Goals Re-Evaluation     Row Name 01/08/23 1109 01/19/23 0824 02/01/23 1652 02/14/23 1356 03/01/23 1640     Exercise Goal Re-Evaluation   Exercise Goals Review Able to understand and use rate of perceived exertion (RPE) scale;Knowledge and understanding of Target Heart Rate Range (THRR);Able to understand and use Dyspnea scale;Understanding of Exercise Prescription Increase Physical Activity;Increase Strength and Stamina;Understanding of Exercise Prescription Increase Physical Activity;Increase Strength and Stamina;Understanding of Exercise Prescription Increase Physical Activity;Increase Strength and Stamina;Understanding of Exercise Prescription Increase Physical Activity;Increase Strength and Stamina;Understanding of Exercise Prescription   Comments Reviewed RPE  and dyspnea scale, THR and program prescription with pt today.  Pt voiced understanding and was given a copy of goals to take home. Cassady is off to a good start in the program. She did well on the treadmill at a speed of 2.6 mph with no incline. She also worked at level 3 on the T4 nustep and level 1.2 on  the recumbent bike. She did well with 2 lb hand weights for resistance training during her first two sessions as well. We will continue  to monitor her progress. Dannelle has only attended one session during this review but continues to do well in rehab. She was able to maintain her realtive intensity on the treadmill, and used the T4 nustep at level 1. We will continue to monitor her progress in the program. Willetta continues to do well in rehab. She has recently increased her level on the T4 nustep form level 1 to 2. She has also been able to maintain her relative intensity on the recumbent bike and treadmill. We will continue to monitor her progress in the program. Briannie continues to do well in rehab. She has recently increased her level on the T4 nustep from level 2 to 6. She has also been able to maintain her relative intensity on the recumbent bike and treadmill. We will continue to monitor her progress in the program.   Expected Outcomes Short: Use RPE daily to regulate intensity. Long: Follow program prescription in THR. Short: Continue to follow current exercise prescription. Long: Continue exercise to increase strength and stamina. Short: Continue to follow current exercise prescription. Long: Continue exercise to increase strength and stamina. Short: Continue to follow current exercise prescription. Long: Continue exercise to increase strength and stamina. Short: Continue to follow current exercise prescription. Long: Continue exercise to increase strength and stamina.            Discharge Exercise Prescription (Final Exercise Prescription Changes):  Exercise Prescription Changes - 03/01/23 1600       Response to Exercise   Blood Pressure (Admit) 122/82    Blood Pressure (Exercise) 114/56    Blood Pressure (Exit) 122/82    Heart Rate (Admit) 83 bpm    Heart Rate (Exercise) 107 bpm    Heart Rate (Exit) 84 bpm    Rating of Perceived Exertion (Exercise) 13    Perceived Dyspnea (Exercise) 0     Symptoms none    Duration Progress to 30 minutes of  aerobic without signs/symptoms of physical distress    Intensity THRR unchanged      Progression   Progression Continue to progress workloads to maintain intensity without signs/symptoms of physical distress.    Average METs 2.19      Resistance Training   Training Prescription Yes    Weight 2 lb    Reps 10-15      Interval Training   Interval Training No      Treadmill   MPH 2.3    Grade 0    Minutes 15    METs 2.76      NuStep   Level 6    Minutes 15    METs 1.5      Oxygen   Maintain Oxygen Saturation 88% or higher             Nutrition:  Target Goals: Understanding of nutrition guidelines, daily intake of sodium 1500mg , cholesterol 200mg , calories 30% from fat and 7% or less from saturated fats, daily to have 5 or more servings of fruits and vegetables.  Education: All About Nutrition: -Group instruction provided by verbal, written material, interactive activities, discussions, models, and posters to present general guidelines for heart healthy nutrition including fat, fiber, MyPlate, the role of sodium in heart healthy nutrition, utilization of the nutrition label, and utilization of this knowledge for meal planning. Follow up email sent as well. Written material given at graduation. Flowsheet Row Cardiac Rehab from [facility]04/[facility] Cardiac and Pulmonary Re[facility]  Education need identified 01/04/23       Biometrics:  Pre Biometrics - 01/04/23 1645       Pre Biometrics   Height 5\' 4"  (1.626 m)    Weight 134 lb 11.2 oz (61.1 kg)    Waist Circumference 37.7 inches    Hip Circumference 40.5 inches    Waist to Hip Ratio 0.93 %    BMI (Calculated) 23.11    Single Leg Stand 3 seconds              Nutrition Therapy Plan and Nutrition Goals:  Nutrition Therapy & Goals - 01/08/23 1355       Nutrition Therapy   Diet Carb controlled, Cardiac, low na    Protein (specify units) 90    Fiber 25 grams     Whole Grain Foods 3 servings    Saturated Fats 15 max. grams    Fruits and Vegetables 5 servings/day    Sodium 2 grams      Personal Nutrition Goals   Nutrition Goal Use healthy fats to promote heart health and support calorie intake.    Personal Goal #2 Eat a protein and a controlled portion of carbs at each meal    Personal Goal #3 Watch saturated fat intake, aim for less than ~15g per day on average    Comments Patient drinks mostly water, sometimes zevia. She reports she gets full quickly, uses small snacks and meal replacement shake often to help her get enough nutrition . She doesn't want to lose weight, feels she could gain some weight even. Encouraged her to continue to eat smaller but more frequent meals, focus in nutrient dense foods over empty calories. She has T2DM, A1C improving, currently at 7.0%. Spoke to her about carb sources, portions, and goal of eating ~30-60g per meal, ~15-30g per snack. Encouraged fibrous, complex carbs like whole grains, legumes, and lentils. Reviewed mediterranean diet handout, types of fats, their sources, how to read label. Her meal replacement was 400kcal, 27gFat, 17gSat fat, 13gCHO, 9gFiber, 27gPro. Spoke to her about how the protein content was good, low in sodium and good calorie count, but that the high saturated fat intake was on concern, than looking for lower sat fat alternatives would be a good idea. She seemed to prefer this meal replacement over trying others at this time. Encouraged her to watch saturated fat intake outside of this meal replacement as a goal to prevent excessive sat fat intake. Also recommended she add a fruit to it as well, since it was low in carbs and almost all of it was fiber.      Intervention Plan   Intervention Prescribe, educate and counsel regarding individualized specific dietary modifications aiming towards targeted core components such as weight, hypertension, lipid management, diabetes, heart failure and other  comorbidities.;Nutrition handout(s) given to patient.    Expected Outcomes Short Term Goal: Understand basic principles of dietary content, such as calories, fat, sodium, cholesterol and nutrients.;Short Term Goal: A plan has been developed with personal nutrition goals set during dietitian appointment.;Long Term Goal: Adherence to prescribed nutrition plan.             Nutrition Assessments:  MEDIFICTS Score Key: >=70 Need to make dietary changes  40-70 Heart Healthy Diet <= 40 Therapeutic Level Cholesterol Diet  Flowsheet Row Cardiac Rehab from 01/04/2023 in [facility] Cardiac and Pulmonary Rehab  Picture Your Plate Total Score on Admission 54      Picture Your Plate Scores: <16 Unhealthy dietary pattern with much room for improvement. 41-50 Dietary pattern unlikely to meet recommendations for  good health and room for improvement. 51-60 More healthful dietary pattern, with some room for improvement.  >60 Healthy dietary pattern, although there may be some specific behaviors that could be improved.    Nutrition Goals Re-Evaluation:   Nutrition Goals Discharge (Final Nutrition Goals Re-Evaluation):   Psychosocial: Target Goals: Acknowledge presence or absence of significant depression and/or stress, maximize coping skills, provide positive support system. Participant is able to verbalize types and ability to use techniques and skills needed for reducing stress and depression.   Education: Stress, Anxiety, and Depression - Group verbal and visual presentation to define topics covered.  Reviews how body is impacted by stress, anxiety, and depression.  Also discusses healthy ways to reduce stress and to treat/manage anxiety and depression.  Written material given at graduation.   Education: Sleep Hygiene -Provides group verbal and written instruction about how sleep can affect your health.  Define sleep hygiene, discuss sleep cycles and impact of sleep habits. Review good sleep hygiene  tips.    Initial Review & Psychosocial Screening:  Initial Psych Review & Screening - 12/27/22 1436       Initial Review   Current issues with None Identified      Family Dynamics   Good Support System? Yes   family, friends     Barriers   Psychosocial barriers to participate in program The patient should benefit from training in stress management and relaxation.      Screening Interventions   Interventions To provide support and resources with identified psychosocial needs;Provide feedback about the scores to participant;Encouraged to exercise    Expected Outcomes Short Term goal: Utilizing psychosocial counselor, staff and physician to assist with identification of specific Stressors or current issues interfering with healing process. Setting desired goal for each stressor or current issue identified.;Long Term Goal: Stressors or current issues are controlled or eliminated.;Short Term goal: Identification and review with participant of any Quality of Life or Depression concerns found by scoring the questionnaire.;Long Term goal: The participant improves quality of Life and PHQ9 Scores as seen by post scores and/or verbalization of changes             Quality of Life Scores:   Quality of Life - 01/04/23 1646       Quality of Life   Select Quality of Life      Quality of Life Scores   Health/Function Pre 27.4 %    Socioeconomic Pre 23.25 %    Psych/Spiritual Pre 29.64 %    Family Pre 30 %    GLOBAL Pre 27.27 %            Scores of 19 and below usually indicate a poorer quality of life in these areas.  A difference of  2-3 points is a clinically meaningful difference.  A difference of 2-3 points in the total score of the Quality of Life Index has been associated with significant improvement in overall quality of life, self-image, physical symptoms, and general health in studies assessing change in quality of life.  PHQ-9: Review Flowsheet  More data exists       01/04/2023 01/03/2023 10/10/2022 08/21/2022 01/11/2022  Depression screen PHQ 2/9  Decreased Interest 0 0 0 0 0  Down, Depressed, Hopeless 0 0 0 0 0  PHQ - 2 Score 0 0 0 0 0  Altered sleeping 0 0 0 0 -  Tired, decreased energy 0 0 0 0 -  Change in appetite 0 0 0 0 -  Feeling bad  or failure about yourself  0 0 0 0 -  Trouble concentrating 0 0 0 0 -  Moving slowly or fidgety/restless 0 - 0 0 -  Suicidal thoughts 0 0 0 0 -  PHQ-9 Score 0 0 0 0 -  Difficult doing work/chores Not difficult at all Not difficult at all Not difficult at all Not difficult at all -    Details           Interpretation of Total Score  Total Score Depression Severity:  1-4 = Minimal depression, 5-9 = Mild depression, 10-14 = Moderate depression, 15-19 = Moderately severe depression, 20-27 = Severe depression   Psychosocial Evaluation and Intervention:  Psychosocial Evaluation - 12/27/22 1451       Psychosocial Evaluation & Interventions   Comments Jacara has no barriers to attending the program. She lives with her husband, son and daughter.  They and her friends are her support. She is alredy walking about a mile a day in her home and has permission form her doctor to increase her distance. She is wearing a LIFE VEST.  She has lost about 50 pounds in the past 2 years. 10 pounds during her recent hospitalization. She does not want to lose more weight. She is ready to get started.    Expected Outcomes STG She attends all scheduled sessions, she works with the EP staff to progress her exercise back to her level prior to the NSTEMI. She will continue to monitor her weight loss. LTG continue to manage her esercise progression and her weight after discharge    Continue Psychosocial Services  Follow up required by staff             Psychosocial Re-Evaluation:  Psychosocial Re-Evaluation     Row Name 02/26/23 [phone]             Psychosocial Re-Evaluation   Current issues with Current Stress Concerns        Comments Keller reports that she is doing well and enjyoing the program. When asked about stress, she mentioned that with flu season coming it does add some extra stress. She gets worried when there are a bunch of sicknesses around. She wants to continue coming to the program as long as she can, but mentions she will exercise at home if there a lot of cases of sicknesses. She is sleeping well and continues to have a great support system.       Expected Outcomes Short: attend cardiac rehab for exercise and education. Long: independently manage heart healthy habits       Interventions Encouraged to attend Cardiac Rehabilitation for the exercise;Stress management education       Continue Psychosocial Services  Follow up required by staff                Psychosocial Discharge (Final Psychosocial Re-Evaluation):  Psychosocial Re-Evaluation - 02/26/23 0949       Psychosocial Re-Evaluation   Current issues with Current Stress Concerns    Comments Morine reports that she is doing well and enjyoing the program. When asked about stress, she mentioned that with flu season coming it does add some extra stress. She gets worried when there are a bunch of sicknesses around. She wants to continue coming to the program as long as she can, but mentions she will exercise at home if there a lot of cases of sicknesses. She is sleeping well and continues to have a great support system.    Expected Outcomes Short: attend  cardiac rehab for exercise and education. Long: independently manage heart healthy habits    Interventions Encouraged to attend Cardiac Rehabilitation for the exercise;Stress management education    Continue Psychosocial Services  Follow up required by staff             Vocational Rehabilitation: Provide vocational rehab assistance to qualifying candidates.   Vocational Rehab Evaluation & Intervention:  Vocational Rehab - 12/27/22 1446       Initial Vocational Rehab Evaluation &  Intervention   Assessment shows need for Vocational Rehabilitation No      Vocational Rehab Re-Evaulation   Comments retired             Education: Education Goals: Education classes will be provided on a variety of topics geared toward better understanding of heart health and risk factor modification. Participant will state understanding/return demonstration of topics presented as noted by education test scores.  Learning Barriers/Preferences:   General Cardiac Education Topics:  AED/CPR: - Group verbal and written instruction with the use of models to demonstrate the basic use of the AED with the basic ABC's of resuscitation.   Anatomy and Cardiac Procedures: - Group verbal and visual presentation and models provide information about basic cardiac anatomy and function. Reviews the testing methods done to diagnose heart disease and the outcomes of the test results. Describes the treatment choices: Medical Management, Angioplasty, or Coronary Bypass Surgery for treating various heart conditions including Myocardial Infarction, Angina, Valve Disease, and Cardiac Arrhythmias.  Written material given at graduation. Flowsheet Row Cardiac Rehab from 01/04/2023 in [facility] Cardiac and Pulmonary Rehab  Education need identified 01/04/23       Medication Safety: - Group verbal and visual instruction to review commonly prescribed medications for heart and lung disease. Reviews the medication, class of the drug, and side effects. Includes the steps to properly store meds and maintain the prescription regimen.  Written material given at graduation.   Intimacy: - Group verbal instruction through game format to discuss how heart and lung disease can affect sexual intimacy. Written material given at graduation..   Know Your Numbers and Heart Failure: - Group verbal and visual instruction to discuss disease risk factors for cardiac and pulmonary disease and treatment options.  Reviews associated  critical values for Overweight/Obesity, Hypertension, Cholesterol, and Diabetes.  Discusses basics of heart failure: signs/symptoms and treatments.  Introduces Heart Failure Zone chart for action plan for heart failure.  Written material given at graduation. Flowsheet Row Cardiac Rehab from 01/04/2023 in [facility] Cardiac and Pulmonary Rehab  Education need identified 01/04/23       Infection Prevention: - Provides verbal and written material to individual with discussion of infection control including proper hand washing and proper equipment cleaning during exercise session. Flowsheet Row Cardiac Rehab from 01/04/2023 in [facility] Cardiac and Pulmonary Rehab  Date 01/04/23  Educator MB  Instruction Review Code 1- Verbalizes Understanding       Falls Prevention: - Provides verbal and written material to individual with discussion of falls prevention and safety. Flowsheet Row Cardiac Rehab from 01/04/2023 in [facility] Cardiac and Pulmonary Rehab  Date 01/04/23  Educator MB  Instruction Review Code 1- Verbalizes Understanding       Other: -Provides group and verbal instruction on various topics (see comments)   Knowledge Questionnaire Score:  Knowledge Questionnaire Score - 01/04/23 1648       Knowledge Questionnaire Score   Pre Score 22/26             Core  Components/Risk Factors/Patient Goals at Admission:  Personal Goals and Risk Factors at Admission - 01/04/23 1648       Core Components/Risk Factors/Patient Goals on Admission    Weight Management Yes;Weight Gain    Intervention Weight Management: Develop a combined nutrition and exercise program designed to reach desired caloric intake, while maintaining appropriate intake of nutrient and fiber, sodium and fats, and appropriate energy expenditure required for the weight goal.;Weight Management: Provide education and appropriate resources to help participant work on and attain dietary goals.;Weight Management/Obesity: Establish  reasonable short term and long term weight goals.    Admit Weight 134 lb 11.2 oz (61.1 kg)    Goal Weight: Short Term 140 lb (63.5 kg)    Goal Weight: Long Term 140 lb (63.5 kg)    Expected Outcomes Short Term: Continue to assess and modify interventions until short term weight is achieved;Long Term: Adherence to nutrition and physical activity/exercise program aimed toward attainment of established weight goal;Weight Gain: Understanding of general recommendations for a high calorie, high protein meal plan that promotes weight gain by distributing calorie intake throughout the day with the consumption for 4-5 meals, snacks, and/or supplements;Understanding recommendations for meals to include 15-35% energy as protein, 25-35% energy from fat, 35-60% energy from carbohydrates, less than 200mg  of dietary cholesterol, 20-35 gm of total fiber daily;Understanding of distribution of calorie intake throughout the day with the consumption of 4-5 meals/snacks    Diabetes Yes    Intervention Provide education about signs/symptoms and action to take for hypo/hyperglycemia.;Provide education about proper nutrition, including hydration, and aerobic/resistive exercise prescription along with prescribed medications to achieve blood glucose in normal ranges: Fasting glucose 65-99 mg/dL    Expected Outcomes Short Term: Participant verbalizes understanding of the signs/symptoms and immediate care of hyper/hypoglycemia, proper foot care and importance of medication, aerobic/resistive exercise and nutrition plan for blood glucose control.;Long Term: Attainment of HbA1C < 7%.    Heart Failure Yes    Intervention Provide a combined exercise and nutrition program that is supplemented with education, support and counseling about heart failure. Directed toward relieving symptoms such as shortness of breath, decreased exercise tolerance, and extremity edema.    Expected Outcomes Improve functional capacity of life;Short term:  Attendance in program 2-3 days a week with increased exercise capacity. Reported lower sodium intake. Reported increased fruit and vegetable intake. Reports medication compliance.;Short term: Daily weights obtained and reported for increase. Utilizing diuretic protocols set by physician.;Long term: Adoption of self-care skills and reduction of barriers for early signs and symptoms recognition and intervention leading to self-care maintenance.    Hypertension Yes    Intervention Provide education on lifestyle modifcations including regular physical activity/exercise, weight management, moderate sodium restriction and increased consumption of fresh fruit, vegetables, and low fat dairy, alcohol moderation, and smoking cessation.;Monitor prescription use compliance.    Expected Outcomes Short Term: Continued assessment and intervention until BP is < 140/56mm HG in hypertensive participants. < 130/9mm HG in hypertensive participants with diabetes, heart failure or chronic kidney disease.;Long Term: Maintenance of blood pressure at goal levels.             Education:Diabetes - Individual verbal and written instruction to review signs/symptoms of diabetes, desired ranges of glucose level fasting, after meals and with exercise. Acknowledge that pre and post exercise glucose checks will be done for 3 sessions at entry of program. Flowsheet Row Cardiac Rehab from 01/04/2023 in [facility] Cardiac and Pulmonary Rehab  Date 01/04/23  Educator MB  Instruction Review Code  1- Verbalizes Understanding       Core Components/Risk Factors/Patient Goals Review:   Goals and Risk Factor Review     Row Name 02/26/23 0944             Core Components/Risk Factors/Patient Goals Review   Personal Goals Review Heart Failure;Weight Management/Obesity       Review Ravina is enjoying the program so far. She is still wearing her Life Vest. They are planning an echo 11/18 and hopes to get it off. Her weight is stable and  wants to keep it around 135lb. They decreased her monjaro and her A1C is doing well.       Expected Outcomes Short: attend cardiac rehab for weight maitenance and heart failure management Long: independently manage risk factors and increase stamina                Core Components/Risk Factors/Patient Goals at Discharge (Final Review):   Goals and Risk Factor Review - 02/26/23 0944       Core Components/Risk Factors/Patient Goals Review   Personal Goals Review Heart Failure;Weight Management/Obesity    Review Trystyn is enjoying the program so far. She is still wearing her Life Vest. They are planning an echo 11/18 and hopes to get it off. Her weight is stable and wants to keep it around 135lb. They decreased her monjaro and her A1C is doing well.    Expected Outcomes Short: attend cardiac rehab for weight maitenance and heart failure management Long: independently manage risk factors and increase stamina             ITP Comments:  ITP Comments     Row Name 12/27/22 1447 01/04/23 1639 01/08/23 1109 01/31/23 1016 02/28/23 1242   ITP Comments Virtual orientation call completed today. shehas an appointment on Date: 01/04/2023  for EP eval and gym Orientation.  Documentation of diagnosis can be found in [facility] 12/12/2022 . Completed and gym orientation. Initial ITP created and sent for review to Bethann Punches, Medical Director. First full day of exercise!  Patient was oriented to gym and equipment including functions, settings, policies, and procedures.  Patient's individual exercise prescription and treatment plan were reviewed.  All starting workloads were established based on the results of the 6 minute walk test done at initial orientation visit.  The plan for exercise progression was also introduced and progression will be customized based on patient's performance and goals. 30 Day review completed. Medical Director ITP review done, changes made as directed, and signed approval by Medical  Director.    new to program Recent hospitalization for angina.  Meds increased to pre NSTEMI level.    Row Name 02/28/23 1248           ITP Comments 30 Day review completed. Medical Director ITP review done, changes made as directed, and signed approval by Medical Director.                Comments: Discharge ITP

## 2023-03-13 ENCOUNTER — Ambulatory Visit: Payer: Medicare Other

## 2023-03-15 ENCOUNTER — Ambulatory Visit: Payer: Medicare Other

## 2023-03-19 ENCOUNTER — Ambulatory Visit: Payer: Medicare Other | Attending: Physician Assistant

## 2023-03-19 DIAGNOSIS — I255 Ischemic cardiomyopathy: Secondary | ICD-10-CM

## 2023-03-19 LAB — ECHOCARDIOGRAM LIMITED
Area-P 1/2: 3.53 cm2
Calc EF: 37.3 %
S' Lateral: 3.9 cm
Single Plane A2C EF: 43.4 %
Single Plane A4C EF: 24.6 %

## 2023-03-19 MED ORDER — PERFLUTREN LIPID MICROSPHERE
1.0000 mL | INTRAVENOUS | Status: AC | PRN
  Administered 2023-03-19: 3 mL via INTRAVENOUS

## 2023-03-20 ENCOUNTER — Ambulatory Visit: Payer: Medicare Other

## 2023-03-20 NOTE — H&P (View-Only) (Signed)
 Electrophysiology Office Note:    Date:  03/21/2023   ID:  Whitney Knight, DOB 1950/09/11, MRN [id number]  [facility] Cardiologist:  Whitney Kendall, MD  [facility] Electrophysiologist:  None   Referring MD: Whitney Luis, MD   Chief Complaint: Systolic heart failure  History of Present Illness:      Discussed the use of AI scribe software for clinical note transcription with the patient, who gave verbal consent to proceed.  Whitney Knight is a 72 year old woman who I am seeing today for an evaluation of coronary artery disease and systolic heart failure at the request of Whitney Knight.  The patient sees Dr. Gala Knight in the heart failure clinic.  The patient has a history of coronary artery disease with a late presenting STEMI, chronic systolic heart failure, diabetes, hypertension, hyperlipidemia and hypothyroidism.  Echo in August of this year showed an ejection fraction of 30 to 35%.  She has an occluded proximal to mid LAD which was treated with PCI.  She is on DAPT.  Hypotension has precluded safe uptitration of GDMT for her heart failure.  She was initially prescribed a LifeVest but discontinued therapy.  She had a repeat echo on March 19, 2023 which showed an EF of 25 to 30% with severe LV dysfunction.  The RV had low normal function and there was mild MR.   History of Present Illness   The patient, with a history of heart disease and diabetes, presents for a discussion about the implantation of an ICD. The patient's ejection fraction is less than 35%, putting her at an increased risk for deadly heart rhythms such as ventricular tachycardia or fibrillation. The patient reports feeling well overall. She is able to walk two miles without any symptoms. The patient also reports a history of a trigger finger, which has been acting up recently.               Their past medical, social and family history was reveiwed.   ROS:   Please see the history of present  illness.    All other systems reviewed and are negative.  EKGs/Labs/Other Studies Reviewed:    The following studies were reviewed today:  January 22, 2023 EKG shows sinus rhythm, nonspecific interventricular conduction delay with a QRS duration of 125 ms       Physical Exam:    VS:  BP 132/80   Pulse 71   Ht 5' 3.5" (1.613 m)   Wt 131 lb (59.4 kg)   BMI 22.84 kg/m     Wt Readings from Last 3 Encounters:  03/21/23 131 lb (59.4 kg)  03/21/23 131 lb 9.6 oz (59.7 kg)  01/22/23 132 lb 9.6 oz (60.1 kg)    GEN: Well appearing, appears younger than stated age CARDIAC: RRR, No MRG RESP: No IWOB. CTAB.       ASSESSMENT AND PLAN:    1. Coronary artery disease involving native coronary artery of native heart without angina pectoris   2. HFrEF (heart failure with reduced ejection fraction) (HCC)   3. Ischemic cardiomyopathy       Assessment and Plan    HF with Reduced Ejection Fraction Ejection fraction less than 30% with increased risk for ventricular tachycardia or fibrillation. Discussed the benefits of an implantable cardioverter-defibrillator (ICD) for protection against life-threatening heart rhythms and improvement in life expectancy. - Will Aspirin 81mg  PO daily uninterrupted  - Spoke with Dr Whitney Knight -- OK to hold Ticagrelor. Plan a 72 hour hold prior to  the implant to minimize risk of bleeding.  General Health Maintenance -Continue walking 2-3 miles daily for overall cardiovascular health. -Avoid red light therapy on the ICD implantation site for three months post-procedure. -Avoid use of SRT optimizer due to potential interference with ICD function.     ----------.   The patient has an ischemic CM (EF 25%), NYHA Class III CHF, and CAD.  He is referred by Dr Whitney Knight for risk stratification of sudden death and consideration of ICD implantation.  At this time, she meets criteria for ICD implantation for primary prevention of sudden death.  I have had a  thorough discussion with the patient reviewing options.  The patient and their family (if available) have had opportunities to ask questions and have them answered. The patient and I have decided together through a shared decision making process to proceed with ICD implant at this time.    Risks, benefits, alternatives to ICD implantation were discussed in detail with the patient today. The patient understands that the risks include but are not limited to bleeding, infection, pneumothorax, perforation, tamponade, vascular damage, renal failure, MI, stroke, death, inappropriate shocks, and lead dislodgement and wishes to proceed.  We will therefore schedule device implantation at the next available time.   [employer] ICD        Signed, Sheria Lang T. Lalla Brothers, MD, [facility], [facility] 03/21/2023 12:30 PM    Electrophysiology [facility]

## 2023-03-20 NOTE — Progress Notes (Unsigned)
Electrophysiology Office Note:    Date:  03/21/2023   ID:  Whitney Knight, DOB 1950/09/11, MRN [id number]  [facility] Cardiologist:  Whitney Kendall, MD  [facility] Electrophysiologist:  None   Referring MD: Whitney Luis, MD   Chief Complaint: Systolic heart failure  History of Present Illness:      Discussed the use of AI scribe software for clinical note transcription with the patient, who gave verbal consent to proceed.  Whitney Knight is a 72 year old woman who I am seeing today for an evaluation of coronary artery disease and systolic heart failure at the request of Whitney Knight.  The patient sees Dr. Gala Knight in the heart failure clinic.  The patient has a history of coronary artery disease with a late presenting STEMI, chronic systolic heart failure, diabetes, hypertension, hyperlipidemia and hypothyroidism.  Echo in August of this year showed an ejection fraction of 30 to 35%.  She has an occluded proximal to mid LAD which was treated with PCI.  She is on DAPT.  Hypotension has precluded safe uptitration of GDMT for her heart failure.  She was initially prescribed a LifeVest but discontinued therapy.  She had a repeat echo on March 19, 2023 which showed an EF of 25 to 30% with severe LV dysfunction.  The RV had low normal function and there was mild MR.   History of Present Illness   The patient, with a history of heart disease and diabetes, presents for a discussion about the implantation of an ICD. The patient's ejection fraction is less than 35%, putting her at an increased risk for deadly heart rhythms such as ventricular tachycardia or fibrillation. The patient reports feeling well overall. She is able to walk two miles without any symptoms. The patient also reports a history of a trigger finger, which has been acting up recently.               Their past medical, social and family history was reveiwed.   ROS:   Please see the history of present  illness.    All other systems reviewed and are negative.  EKGs/Labs/Other Studies Reviewed:    The following studies were reviewed today:  January 22, 2023 EKG shows sinus rhythm, nonspecific interventricular conduction delay with a QRS duration of 125 ms       Physical Exam:    VS:  BP 132/80   Pulse 71   Ht 5' 3.5" (1.613 m)   Wt 131 lb (59.4 kg)   BMI 22.84 kg/m     Wt Readings from Last 3 Encounters:  03/21/23 131 lb (59.4 kg)  03/21/23 131 lb 9.6 oz (59.7 kg)  01/22/23 132 lb 9.6 oz (60.1 kg)    GEN: Well appearing, appears younger than stated age CARDIAC: RRR, No MRG RESP: No IWOB. CTAB.       ASSESSMENT AND PLAN:    1. Coronary artery disease involving native coronary artery of native heart without angina pectoris   2. HFrEF (heart failure with reduced ejection fraction) (HCC)   3. Ischemic cardiomyopathy       Assessment and Plan    HF with Reduced Ejection Fraction Ejection fraction less than 30% with increased risk for ventricular tachycardia or fibrillation. Discussed the benefits of an implantable cardioverter-defibrillator (ICD) for protection against life-threatening heart rhythms and improvement in life expectancy. - Will Aspirin 81mg  PO daily uninterrupted  - Spoke with Dr Whitney Knight -- OK to hold Ticagrelor. Plan a 72 hour hold prior to  the implant to minimize risk of bleeding.  General Health Maintenance -Continue walking 2-3 miles daily for overall cardiovascular health. -Avoid red light therapy on the ICD implantation site for three months post-procedure. -Avoid use of SRT optimizer due to potential interference with ICD function.     ----------.   The patient has an ischemic CM (EF 25%), NYHA Class III CHF, and CAD.  He is referred by Dr Whitney Knight for risk stratification of sudden death and consideration of ICD implantation.  At this time, she meets criteria for ICD implantation for primary prevention of sudden death.  I have had a  thorough discussion with the patient reviewing options.  The patient and their family (if available) have had opportunities to ask questions and have them answered. The patient and I have decided together through a shared decision making process to proceed with ICD implant at this time.    Risks, benefits, alternatives to ICD implantation were discussed in detail with the patient today. The patient understands that the risks include but are not limited to bleeding, infection, pneumothorax, perforation, tamponade, vascular damage, renal failure, MI, stroke, death, inappropriate shocks, and lead dislodgement and wishes to proceed.  We will therefore schedule device implantation at the next available time.   [employer] ICD        Signed, Sheria Lang T. Lalla Brothers, MD, [facility], [facility] 03/21/2023 12:30 PM    Electrophysiology [facility]

## 2023-03-20 NOTE — Progress Notes (Unsigned)
Cardiology Office Note    Date:  03/21/2023   ID:  Whitney Knight, DOB [birth year], MRN [id number]  PCP:  Glori Luis, MD  Cardiologist:  Yvonne Kendall, MD  Electrophysiologist:  None   Chief Complaint: Follow up  History of Present Illness:   Whitney Knight is a 72 y.o. female with history of CAD with late presenting anterior ST elevation MI, HFrEF secondary to ICM, DM2, HTN, HLD, and hypothyroidism who presents for follow up of CAD and cardiomyopathy.   Calcium score in 2022 of 37.1 which was the 64th percentile.  She was admitted to [facility] in 11/2022 with a late presenting anterior ST elevation MI and acute HFrEF.  Initially, concerns were for myocarditis given multiple sick contacts.  Troponin peaked at 2024.  BNP 1663.  COVID/respiratory panel negative.  Echo demonstrated an EF of 30 to 35%, WMA, G2DD, normal RV systolic function, ventricular cavity size, and PASP, mildly dilated left atrium, moderate pleural effusion in the left and right lateral regions, mild to moderate mitral regurgitation, moderate tricuspid regurgitation, and an estimated right atrial pressure of 3 mmHg.  R/LHC showed an occluded proximal to mid LAD which was treated with PCI/DES (overall difficult procedure).  Otherwise, no significant disease in a somewhat codominant system with a wraparound LAD providing the distal half to one third of PDA territory.  Hemodynamic findings were consistent with mild pulmonary hypertension.  She had symptomatic improvement with PCI, IV diuresis, and escalation of GDMT.  Discharged home with LifeVest.  Has preferred red yeast rice and CoQ10 over statin.  Relative hypotension has precluded escalation of GDMT.  She was evaluated by the advanced heart failure service and initiated on candesartan which was subsequently discontinued with hypotension into the 80s systolic.  Furosemide has been transitioned to as needed.  She was last seen in the office on 01/22/2023 and was doing very  well from a cardiac perspective, without symptoms of angina or cardiac decompensation.  She was having some intermittent issues with lead connectivity with LifeVest, no shocks.  She was advancing activity as tolerated.  There was an attempt to titrate carvedilol to 6.25 mg twice daily, though with this blood pressure was soft in the 90s over 50s leading carvedilol to be resumed at lower dose 3.125 mg twice daily with continuation of Farxiga 10 mg and spironolactone 12.5 mg with stable blood pressures in the 100s over 60s.  Relative hypotension continued to preclude escalation of GDMT.  Limited echo on 03/19/2023 showed an EF of 25 to 30%, global hypokinesis, grade 2 diastolic dysfunction, low normal RV systolic function, mild mitral regurgitation, and an estimated right atrial pressure of 3 mmHg.  She comes in accompanied by her husband today and continues to do very well from a cardiac perspective, without symptoms of angina or cardiac decompensation.  She remains active at baseline ambulating 2 to 3 miles regularly without cardiac limitation.  She does notice some mild dyspnea after taking a bath.  No dizziness, presyncope, or syncope.  Discontinued LifeVest secondary to connectivity and alarm issues leading to increased anxiety.  No lower extremity swelling or progressive orthopnea.  Blood pressure stable on current regimen of GDMT including carvedilol 3.125 mg twice daily, Farxiga 10 mg, and spironolactone 12.5 mg.  Tolerating DAPT without bleeding concerns.  No falls.  To be evaluated by EP today for discussion of ICD with persistent cardiomyopathy.   Labs independently reviewed: 12/2022 - A1c 6.6, BNP 345, BUN 23, serum creatinine 1.03, potassium  4.6 11/2022 - BNP 423, Hgb 14.4, PLT 209, magnesium 2.4 11/2022 - LP(a) 48.3, Hgb 13.8, PLT 299, TC 141, TG 91, HDL 36, LDL 87, TSH normal 07/2022 - A1c 7.0 05/2022 - albumin 4.2, AST/ALT normal  Past Medical History:  Diagnosis Date   Allergy    Anemia     Arthritis    KNEES   Cataract    Chronic sinusitis    Cough    Diabetes mellitus    Diverticulosis    GERD (gastroesophageal reflux disease)    Headache(784.0)    [facility] in past   Hypertension    Osteoporosis    Thyroid disease     Past Surgical History:  Procedure Laterality Date   bone graft     for dental surgery   CARPAL TUNNEL RELEASE Right 02/27/2013   [facility]   COLONOSCOPY  2014   CORONARY STENT INTERVENTION N/A 12/14/2022   Procedure: CORONARY STENT INTERVENTION;  Surgeon: Marykay Lex, MD;  Location: [facility] INVASIVE CV LAB;  Service: Cardiovascular;  Laterality: N/A;   dental implants     DENTAL SURGERY     DENTAL SURGERY     dental implant   NASAL SINUS SURGERY  06/2008   Dr. Jenne Campus   RIGHT/LEFT HEART CATH AND CORONARY ANGIOGRAPHY N/A 12/14/2022   Procedure: RIGHT/LEFT HEART CATH AND CORONARY ANGIOGRAPHY;  Surgeon: Marykay Lex, MD;  Location: [facility] INVASIVE CV LAB;  Service: Cardiovascular;  Laterality: N/A;   TRIGGER FINGER RELEASE Right 02/27/2013   [facility]   UPPER GASTROINTESTINAL ENDOSCOPY     VAGINAL DELIVERY     2    Current Medications: Current Meds  Medication Sig   aspirin EC 81 MG tablet Take 1 tablet (81 mg total) by mouth daily. Swallow whole.   blood glucose meter kit and supplies KIT One touch ultra mini. Use 2-4 times per day.   carvedilol (COREG) 6.25 MG tablet Take 0.5 tablets (3.125 mg total) by mouth 2 (two) times daily with a meal.   cetirizine (ZYRTEC) 10 MG chewable tablet Chew 10 mg by mouth daily.   Continuous Glucose Receiver (DEXCOM G7 RECEIVER) DEVI Use to check glucose at least every 8 hours.   Continuous Glucose Sensor (DEXCOM G7 SENSOR) MISC Apply every 10 days   dapagliflozin propanediol (FARXIGA) 10 MG TABS tablet Take 1 tablet (10 mg total) by mouth daily.   diazepam (VALIUM) 5 MG tablet Take 1 tablet (5 mg total) by mouth once for 1 dose.   fluticasone (FLONASE) 50 MCG/ACT nasal spray USE TWO SPRAYS IN EACH  NOSTRIL DAILY   Lancets (ONETOUCH DELICA PLUS LANCET33G) MISC USE ONCE DAILY AS DIRECTED   levOCARNitine (L-CARNITINE PO) Take 2 tablets by mouth daily at 6 (six) AM.   levothyroxine (SYNTHROID) 75 MCG tablet TAKE 1 TABLET BY MOUTH DAILY   ONETOUCH ULTRA test strip USE TO CHECK BLOOD SUGAR UP TO THREE TIMES DAILY   saccharomyces boulardii (FLORASTOR) 250 MG capsule Take 1 capsule (250 mg total) by mouth daily.   spironolactone (ALDACTONE) 25 MG tablet Take 0.5 tablets (12.5 mg total) by mouth daily.   ticagrelor (BRILINTA) 90 MG TABS tablet Take 1 tablet (90 mg total) by mouth 2 (two) times daily.   tirzepatide (MOUNJARO) 12.5 MG/0.5ML Pen Inject 12.5 mg into the skin once a week.   [DISCONTINUED] candesartan (ATACAND) 4 MG tablet Take 0.5 tablets (2 mg total) by mouth daily.    Allergies:   Clarithromycin, Jardiance [empagliflozin], Penicillins, and Prednisone  Social History   Socioeconomic History   Marital status: Married    Spouse name: Not on file   Number of children: 2   Years of education: Not on file   Highest education level: Not on file  Occupational History   Occupation: [employer]    Employer: [employer]  Tobacco Use   Smoking status: Never   Smokeless tobacco: Never  Vaping Use   Vaping status: Never Used  Substance and Sexual Activity   Alcohol use: Yes    Comment: occ   Drug use: No   Sexual activity: Not on file  Other Topics Concern   Not on file  Social History Narrative   Lives with husband. No pets, 2 children.      Work - [employer]      Diet - regular   Exercise - none presently   Social Determinants of Health   Financial Resource Strain: Patient Declined (12/12/2022)   Overall Financial Resource Strain (CARDIA)    Difficulty of Paying Living Expenses: Patient declined  Food Insecurity: No Food Insecurity (12/18/2022)   Hunger Vital Sign    Worried About Running Out of Food in the Last Year: Never true    Ran Out of Food in the Last  Year: Never true  Transportation Needs: No Transportation Needs (12/18/2022)   PRAPARE - Administrator, Civil Service (Medical): No    Lack of Transportation (Non-Medical): No  Physical Activity: Sufficiently Active (12/12/2022)   Exercise Vital Sign    Days of Exercise per Week: 7 days    Minutes of Exercise per Session: 30 min  Stress: No Stress Concern Present (12/12/2022)   [employer] of Occupational Health - Occupational Stress Questionnaire    Feeling of Stress : Not at all  Social Connections: Unknown (12/12/2022)   Social Connection and Isolation Panel [NHANES]    Frequency of Communication with Friends and Family: Patient declined    Frequency of Social Gatherings with Friends and Family: Patient declined    Attends Religious Services: Patient declined    Database administrator or Organizations: Patient declined    Attends Engineer, structural: More than 4 times per year    Marital Status: Married     Family History:  The patient's family history includes COPD in her father; Diabetes in her maternal grandmother; Heart disease in her father and mother; Hypertension in her father. There is no history of Colon cancer, Esophageal cancer, Pancreatic cancer, Stomach cancer, or Liver disease.  ROS:   12-point review of systems is negative unless otherwise noted in the HPI.   EKGs/Labs/Other Studies Reviewed:    Studies reviewed were summarized above. The additional studies were reviewed today:  Calcium score 02/24/2021: IMPRESSION AND RECOMMENDATION: 1. Coronary calcium score of 37.1. This was 64th percentile for age and sex matched control. 2. CAC 1-99 in LAD.  CAC-DRS A1/N1. 3. Continue heart healthy lifestyle and risk factor modification. 4. Consider statin therapy due to Age >55. __________   2D echo 12/13/2022: 1. Left ventricular ejection fraction, by estimation, is 30 to 35%. The  left ventricle has moderately decreased function. The left  ventricle  demonstrates regional wall motion abnormalities (see scoring  diagram/findings for description). Left ventricular   diastolic parameters are consistent with Grade II diastolic dysfunction  (pseudonormalization). Elevated left atrial pressure. The average left  ventricular global longitudinal strain is -6.7 %. The global longitudinal  strain is abnormal.   2. Right ventricular systolic function is  normal. The right ventricular  size is normal. There is normal pulmonary artery systolic pressure.   3. Left atrial size was mildly dilated.   4. Moderate pleural effusion in both left and right lateral regions.   5. The mitral valve is abnormal. Mild to moderate mitral valve  regurgitation. No evidence of mitral stenosis.   6. Tricuspid valve regurgitation is moderate.   7. The aortic valve is tricuspid. Aortic valve regurgitation is not  visualized. No aortic stenosis is present.   8. The inferior vena cava is normal in size with greater than 50%  respiratory variability, suggesting right atrial pressure of 3 mmHg.   Conclusion(s)/Recommendation(s): No left ventricular mural or apical thrombus/thrombi.  __________   [facility] 12/14/2022:   CULPRIT LESION: Prox LAD to Mid LAD lesion is 100% stenosed with 0% stenosed side branch in 1st Diag.   A drug-eluting stent was successfully placed using a STENT ONYX FRONTIER 2.5X30. => Taper postdilation from 3.1 to 2.8 mm   Post intervention, there is a 0% residual stenosis.  TIMI-3 flow restored.   Post intervention, the side branch was reduced to 0% residual stenosis distally (very small vessel); 1st Diag lesion is 100% stenosed distally.   Hemodynamic findings consistent with mild pulmonary hypertension.   There is no aortic valve stenosis.   Anticipated discharge date to be determined.   Patient still has need for additional titration of CHF/C medications along with additional diuresis   Recommend uninterrupted dual antiplatelet therapy with  Aspirin 81mg  daily and Ticagrelor 90mg  twice daily for a minimum of 12 months (ACS-Class I recommendation).   Would continue Thienopyridine monotherapy with either clopidogrel 75 mg daily or ticagrelor 60 mg twice daily long-term based on 30 mm stent in the proximal LAD.   POST-OPERATIVE DIAGNOSIS:   Severe single-vessel disease with 100% occlusion of the prox-mid LAD at SP1 otherwise no significant disease in a somewhat codominant system with the wraparound LAD providing the distal half to one third of the PDA territory. Difficult but successful DES PCI of the LAD with a Resolute Onyx DES 2.5 mm x 30 mm stent postdilated and tapered fashion from 3.1 to 2.8 mm.  2 small diagonal branches noted after stent with 1 small diagonal that barely feels that arose from the original occluded section. Consistent combined CHF with known EF of 30 to 35% and PCWP of 29 millimercury and LVEDP of 21 mL of mercury, but preserved cardiac output put and index with cardiac output of 5.44 and index of 3.16. RHC numbers: RAP 5 mmHg; RV P-EDP 46/0-8 mmHg; PAP-mean 46/19-32 mmHg (mild to moderate WHO class II-LV CHF related), PCWP 28 mmHg.  LV P-EDP 113/5-21 mmHg; AO P-MAP 104/68-81 mmHg.  Ao sat 90%, PA sat 68%.     PLAN OF CARE:  Patient continues to be inpatient.  I have written for IV Lasix 60 mg to be given today post cath (early PM dose).  Will continue with IV diuresis given elevated LVEDP and PCWP - INCREASE TO 60mg  BID starting tonite.  DAPT for minimum 1 year but would like to continue Thienopyridine for lifetime based on length of stent in the LAD.  Continue to titrate GDMT for CAD and CM/CHF.  Phase 2 [facility] ordered. __________  Limited echo 03/19/2023: 1. Left ventricular ejection fraction, by estimation, is 25 to 30%. The  left ventricle has severely decreased function. The left ventricle  demonstrates global hypokinesis. Left ventricular diastolic parameters are  consistent with Grade II diastolic  dysfunction  (  pseudonormalization).   2. Right ventricular systolic function is low normal.   3. The mitral valve is normal in structure. Mild mitral valve  regurgitation.   4. The inferior vena cava is normal in size with greater than 50%  respiratory variability, suggesting right atrial pressure of 3 mmHg.    EKG:  EKG is not ordered today.    Recent Labs: 05/18/2022: ALT 31 12/12/2022: Pro B Natriuretic peptide (BNP) 1,663.0; TSH 2.723 12/27/2022: Hemoglobin 14.4; Magnesium 2.4; Platelets 209 01/22/2023: BNP 345.5; BUN 23; Creatinine, Ser 1.03; Potassium 4.6; Sodium 138  Recent Lipid Panel    Component Value Date/Time   CHOL 141 12/14/2022 0629   CHOL 202 (H) 04/04/2021 1034   TRIG 91 12/14/2022 0629   HDL 36 (L) 12/14/2022 0629   HDL 72 04/04/2021 1034   CHOLHDL 3.9 12/14/2022 0629   VLDL 18 12/14/2022 0629   LDLCALC 87 12/14/2022 0629   LDLCALC 113 (H) 04/04/2021 1034   LDLDIRECT 118 (H) 10/04/2021 1058    PHYSICAL EXAM:    VS:  BP 132/80 (BP Location: Left Arm, Patient Position: Sitting, Cuff Size: Normal)   Pulse 71   Ht 5' 3.5" (1.613 m)   Wt 131 lb 9.6 oz (59.7 kg)   BMI 22.95 kg/m   BMI: Body mass index is 22.95 kg/m.  Physical Exam Vitals reviewed.  Constitutional:      Appearance: She is well-developed.  HENT:     Head: Normocephalic and atraumatic.  Eyes:     General:        Right eye: No discharge.        Left eye: No discharge.  Neck:     Vascular: No JVD.  Cardiovascular:     Rate and Rhythm: Normal rate and regular rhythm.     Heart sounds: Normal heart sounds, S1 normal and S2 normal. Heart sounds not distant. No midsystolic click and no opening snap. No murmur heard.    No friction rub.  Pulmonary:     Effort: Pulmonary effort is normal. No respiratory distress.     Breath sounds: Normal breath sounds. No decreased breath sounds, wheezing, rhonchi or rales.  Chest:     Chest wall: No tenderness.  Abdominal:     General: There is no distension.   Musculoskeletal:     Cervical back: Normal range of motion.     Right lower leg: No edema.     Left lower leg: No edema.  Skin:    General: Skin is warm and dry.     Nails: There is no clubbing.  Neurological:     Mental Status: She is alert and oriented to person, place, and time.  Psychiatric:        Speech: Speech normal.        Behavior: Behavior normal.        Thought Content: Thought content normal.        Judgment: Judgment normal.     Wt Readings from Last 3 Encounters:  03/21/23 131 lb (59.4 kg)  03/21/23 131 lb 9.6 oz (59.7 kg)  01/22/23 132 lb 9.6 oz (60.1 kg)     ASSESSMENT & PLAN:   CAD involving the native coronary arteries without angina: She continues to do well and is without symptoms concerning for angina. Continue aggressive risk factor modification and secondary prevention with ASA 81 mg daily and ticagrelor 90 mg twice daily for a minimum of 12 months dating back to date of PCI without interruption (12/14/2022). She  remains on carvedilol. Historically declined statin and ezetimibe. Prefers to avoid PCSK9 inhibitor.  No longer with dissipating in cardiac rehab due to increased URI burden in the community.  No indication for further ischemic testing at this time.   HFrEF secondary to ICM: Euvolemic and well compensated. Relative hypotension has precluded escalation of GDMT and led to discontinuation of ARB. Remains on carvedilol 3.125 mg twice daily, Farxiga 10 mg, spironolactone 12.5 mg.  Now requiring standing loop diuretic.  Escalate GDMT as/if able.  Limited echo earlier this week showed a persistent cardiomyopathy with a EF of 25 to 30%.  Schedule cardiac MRI.  In the setting of occasional anxiety with claustrophobia, she will take Valium 5 mg x 1 prior to cardiac MRI.  She was advised her husband will need to drive her to and from the MRI.  [place].  EP planning for ICD.  QRS not wide enough for CRT.  HTN: Blood pressure is  well-controlled in the office today.  Historically, blood pressure has limited escalation of GDMT.  Continue medical therapy as outlined above.  HLD with statin intolerance: LDL 87 in 11/2022 with target LDL being less than 55.  Has declined further pharmacotherapy.  DM2: A1c 6.6 in 12/2022.  Remains on SGLT2 inhibitor and GLP-1 therapy.     Disposition: F/u with Dr. Okey Dupre or an APP in 3 months.   Medication Adjustments/Labs and Tests Ordered: Current medicines are reviewed at length with the patient today.  Concerns regarding medicines are outlined above. Medication changes, Labs and Tests ordered today are summarized above and listed in the Patient Instructions accessible in Encounters.   Signed, Eula Listen, PA-C 03/21/2023 12:20 PM     [facility] [place] [address] [place], [place] [address] [phone]

## 2023-03-21 ENCOUNTER — Other Ambulatory Visit: Payer: Self-pay

## 2023-03-21 ENCOUNTER — Encounter: Payer: Self-pay | Admitting: Physician Assistant

## 2023-03-21 ENCOUNTER — Encounter: Payer: Self-pay | Admitting: Cardiology

## 2023-03-21 ENCOUNTER — Encounter: Payer: Self-pay | Admitting: Internal Medicine

## 2023-03-21 ENCOUNTER — Ambulatory Visit: Payer: Medicare Other | Attending: Physician Assistant | Admitting: Physician Assistant

## 2023-03-21 ENCOUNTER — Ambulatory Visit: Payer: Medicare Other | Admitting: Cardiology

## 2023-03-21 VITALS — BP 132/80 | HR 71 | Ht 63.5 in | Wt 131.0 lb

## 2023-03-21 VITALS — BP 132/80 | HR 71 | Ht 63.5 in | Wt 131.6 lb

## 2023-03-21 DIAGNOSIS — I5020 Unspecified systolic (congestive) heart failure: Secondary | ICD-10-CM

## 2023-03-21 DIAGNOSIS — I10 Essential (primary) hypertension: Secondary | ICD-10-CM | POA: Diagnosis not present

## 2023-03-21 DIAGNOSIS — I255 Ischemic cardiomyopathy: Secondary | ICD-10-CM

## 2023-03-21 DIAGNOSIS — E785 Hyperlipidemia, unspecified: Secondary | ICD-10-CM | POA: Diagnosis not present

## 2023-03-21 DIAGNOSIS — I2510 Atherosclerotic heart disease of native coronary artery without angina pectoris: Secondary | ICD-10-CM | POA: Diagnosis not present

## 2023-03-21 DIAGNOSIS — E118 Type 2 diabetes mellitus with unspecified complications: Secondary | ICD-10-CM

## 2023-03-21 DIAGNOSIS — F418 Other specified anxiety disorders: Secondary | ICD-10-CM

## 2023-03-21 DIAGNOSIS — Z789 Other specified health status: Secondary | ICD-10-CM | POA: Diagnosis not present

## 2023-03-21 MED ORDER — DIAZEPAM 5 MG PO TABS: 5.0000 mg | ORAL_TABLET | Freq: Once | ORAL | 0 refills | Status: AC

## 2023-03-21 NOTE — Addendum Note (Signed)
Addended by: Frutoso Schatz on: 03/21/2023 12:39 PM   Modules accepted: Orders

## 2023-03-21 NOTE — Patient Instructions (Signed)
 Medication Instructions:  Your physician recommends that you continue on your current medications as directed. Please refer to the Current Medication list given to you today.  *If you need a refill on your cardiac medications before your next appointment, please call your pharmacy*  Lab Work: BMET and CBC  Testing/Procedures: Your physician has recommended that you have a defibrillator inserted. An implantable cardioverter defibrillator (ICD) is a small device that is placed in your chest or, in rare cases, your abdomen. This device uses electrical pulses or shocks to help control life-threatening, irregular heartbeats that could lead the heart to suddenly stop beating (sudden cardiac arrest). Leads are attached to the ICD that goes into your heart. This is done in the hospital and usually requires an overnight stay. Please see the instruction sheet given to you today for more information.  Follow-Up: At [facility], you and your health needs are our priority.  As part of our continuing mission to provide you with exceptional heart care, we have created designated Provider Care Teams.  These Care Teams include your primary Cardiologist (physician) and Advanced Practice Providers (APPs -  Physician Assistants and Nurse Practitioners) who all work together to provide you with the care you need, when you need it.   Your next appointment:   We will call you to arrange your follow up appointments

## 2023-03-21 NOTE — Patient Instructions (Addendum)
Medication Instructions:  Your physician recommends the following medication changes.  STOP TAKING: Candesartan (this was removed from your med list)  START TAKING: Diazepam (Valium) 5 mg take one tablet 30 prior to Cardiac MRI  *If you need a refill on your cardiac medications before your next appointment, please call your pharmacy*   Lab Work: Your provider would like for you to have following labs drawn today CBC.   If you have labs (blood work) drawn today and your tests are completely normal, you will receive your results only by: MyChart Message (if you have MyChart) OR A paper copy in the mail If you have any lab test that is abnormal or we need to change your treatment, we will call you to review the results.   Testing/Procedures: Your physician has requested that you have a cardiac MRI. Cardiac MRI uses a computer to create images of your heart as its beating, producing both still and moving pictures of your heart and major blood vessels. For further information please visit [email or web address]. Please follow the instruction sheet given to you today for more information.  You will be scheduled for Cardiac MRI at the location below.   [facility] [address] [place], [place] [address] [phone] Please go to the [facility] and check-in with the desk attendant.   Magnetic resonance imaging (MRI) is a painless test that produces images of the inside of the body without using Xrays.  During an MRI, strong magnets and radio waves work together in a Data processing manager to form detailed images.   MRI images may provide more details about a medical condition than X-rays, CT scans, and ultrasounds can provide.  You may be given earphones to listen for instructions.  You may eat a light breakfast and take medications as ordered with the exception of spironolactone. If you are undergoing a stress MRI, please avoid stimulants for 12 hr prior to test.  (Ie. Caffeine, nicotine, chocolate, or antihistamine medications)  An IV will be inserted into one of your veins. Contrast material will be injected into your IV. It will leave your body through your urine within a day. You may be told to drink plenty of fluids to help flush the contrast material out of your system.  You will be asked to remove all metal, including: Watch, jewelry, and other metal objects including hearing aids, hair pieces and dentures. Also wearable glucose monitoring systems (ie. Freestyle Libre and Omnipods) (Braces and fillings normally are not a problem.)   TEST WILL TAKE APPROXIMATELY 1 HOUR  PLEASE NOTIFY SCHEDULING AT LEAST 24 HOURS IN ADVANCE IF YOU ARE UNABLE TO KEEP YOUR APPOINTMENT. [phone]  For more information and frequently asked questions, please visit our website : [email or web address]  Please call the Cardiac Imaging Nurse Navigators with any questions/concerns. [phone] Office     Follow-Up: At [facility], you and your health needs are our priority.  As part of our continuing mission to provide you with exceptional heart care, we have created designated Provider Care Teams.  These Care Teams include your primary Cardiologist (physician) and Advanced Practice Providers (APPs -  Physician Assistants and Nurse Practitioners) who all work together to provide you with the care you need, when you need it.  We recommend signing up for the patient portal called "MyChart".  Sign up information is provided on this After Visit Summary.  MyChart is used to connect with patients for Virtual Visits (Telemedicine).  Patients are able to view  lab/test results, encounter notes, upcoming appointments, etc.  Non-urgent messages can be sent to your provider as well.   To learn more about what you can do with MyChart, go to [email or web address].    Your next appointment:   3 month(s)  Provider:   You may see Yvonne Kendall, MD or one  of the following Advanced Practice Providers on your designated Care Team:   Eula Listen, [place]

## 2023-03-22 ENCOUNTER — Encounter: Payer: Self-pay | Admitting: Family Medicine

## 2023-03-22 ENCOUNTER — Ambulatory Visit: Payer: Medicare Other

## 2023-03-22 LAB — BASIC METABOLIC PANEL
BUN/Creatinine Ratio: 25 (ref 12–28)
BUN: 25 mg/dL (ref 8–27)
CO2: 24 mmol/L (ref 20–29)
Calcium: 9.5 mg/dL (ref 8.7–10.3)
Chloride: 98 mmol/L (ref 96–106)
Creatinine, Ser: 0.99 mg/dL (ref 0.57–1.00)
Glucose: 105 mg/dL — ABNORMAL HIGH (ref 70–99)
Potassium: 4.6 mmol/L (ref 3.5–5.2)
Sodium: 137 mmol/L (ref 134–144)
eGFR: 61 mL/min/{1.73_m2} (ref >59)

## 2023-03-22 LAB — CBC
Hematocrit: 44.4 % (ref 34.0–46.6)
Hemoglobin: 14.1 g/dL (ref 11.1–15.9)
MCH: 27.6 pg (ref 26.6–33.0)
MCHC: 31.8 g/dL (ref 31.5–35.7)
MCV: 87 fL (ref 79–97)
Platelets: 225 10*3/uL (ref 150–450)
RBC: 5.11 x10E6/uL (ref 3.77–5.28)
RDW: 13.2 % (ref 11.7–15.4)
WBC: 9.5 10*3/uL (ref 3.4–10.8)

## 2023-03-24 ENCOUNTER — Other Ambulatory Visit: Payer: Self-pay | Admitting: Family Medicine

## 2023-03-24 DIAGNOSIS — J189 Pneumonia, unspecified organism: Secondary | ICD-10-CM

## 2023-03-24 DIAGNOSIS — R0602 Shortness of breath: Secondary | ICD-10-CM

## 2023-03-27 ENCOUNTER — Other Ambulatory Visit: Payer: Self-pay | Admitting: Physician Assistant

## 2023-03-27 ENCOUNTER — Ambulatory Visit: Payer: Medicare Other

## 2023-03-27 ENCOUNTER — Ambulatory Visit
Admission: RE | Admit: 2023-03-27 | Discharge: 2023-03-27 | Disposition: A | Discharge status: Home / Self Care | Payer: Medicare Other | Source: Ambulatory Visit | Attending: Physician Assistant | Admitting: Physician Assistant

## 2023-03-27 DIAGNOSIS — I255 Ischemic cardiomyopathy: Secondary | ICD-10-CM | POA: Diagnosis not present

## 2023-03-27 MED ORDER — GADOBUTROL 1 MMOL/ML IV SOLN
9.0000 mL | Freq: Once | INTRAVENOUS | Status: AC | PRN
  Administered 2023-03-27: 9 mL via INTRAVENOUS

## 2023-03-28 ENCOUNTER — Other Ambulatory Visit (HOSPITAL_COMMUNITY): Payer: Self-pay

## 2023-03-28 ENCOUNTER — Other Ambulatory Visit: Payer: Self-pay

## 2023-03-28 MED ORDER — SACCHAROMYCES BOULARDII 250 MG PO CAPS
250.0000 mg | ORAL_CAPSULE | Freq: Every day | ORAL | 3 refills | Status: DC
  Filled 2023-03-28 – 2023-04-10 (×3): qty 20, 20d supply, fill #0
  Filled 2023-04-25: qty 20, 20d supply, fill #1
  Filled 2023-05-16: qty 20, 20d supply, fill #2
  Filled 2023-07-02: qty 20, 20d supply, fill #3
  Filled 2023-07-20: qty 20, 20d supply, fill #4
  Filled 2023-08-09: qty 20, 20d supply, fill #5
  Filled 2023-08-29: qty 20, 20d supply, fill #6
  Filled 2023-09-18: qty 20, 20d supply, fill #7
  Filled 2023-10-15: qty 20, 20d supply, fill #8

## 2023-03-30 ENCOUNTER — Encounter: Payer: Self-pay | Admitting: Pharmacist

## 2023-03-30 ENCOUNTER — Other Ambulatory Visit: Payer: Self-pay

## 2023-03-30 ENCOUNTER — Other Ambulatory Visit (HOSPITAL_COMMUNITY): Payer: Self-pay

## 2023-03-30 ENCOUNTER — Encounter (HOSPITAL_COMMUNITY): Payer: Self-pay

## 2023-04-03 ENCOUNTER — Other Ambulatory Visit: Payer: Self-pay

## 2023-04-03 ENCOUNTER — Other Ambulatory Visit: Payer: Self-pay | Admitting: Family Medicine

## 2023-04-03 ENCOUNTER — Other Ambulatory Visit (HOSPITAL_COMMUNITY): Payer: Self-pay

## 2023-04-03 ENCOUNTER — Ambulatory Visit: Payer: Medicare Other

## 2023-04-03 DIAGNOSIS — E1165 Type 2 diabetes mellitus with hyperglycemia: Secondary | ICD-10-CM

## 2023-04-03 MED ORDER — DEXCOM G7 RECEIVER DEVI
0 refills | Status: DC
  Filled 2023-04-03: qty 1, fill #0

## 2023-04-04 ENCOUNTER — Other Ambulatory Visit (HOSPITAL_COMMUNITY): Payer: Self-pay

## 2023-04-04 ENCOUNTER — Encounter (HOSPITAL_COMMUNITY): Payer: Self-pay

## 2023-04-04 ENCOUNTER — Other Ambulatory Visit: Payer: Self-pay

## 2023-04-05 ENCOUNTER — Ambulatory Visit: Payer: Medicare Other

## 2023-04-09 NOTE — Pre-Procedure Instructions (Signed)
Instructed patient on the following items: Arrival time 1:30 Nothing to eat or drink after midnight No meds AM of procedure Responsible person to drive you home and stay with you for 24 hrs Wash with special soap night before and morning of procedure If on anti-coagulant drug instructions Brilinta- last dose 12/7

## 2023-04-10 ENCOUNTER — Ambulatory Visit: Payer: Medicare Other

## 2023-04-10 ENCOUNTER — Other Ambulatory Visit (HOSPITAL_COMMUNITY): Payer: Self-pay

## 2023-04-10 ENCOUNTER — Other Ambulatory Visit: Payer: Self-pay

## 2023-04-11 ENCOUNTER — Ambulatory Visit (HOSPITAL_COMMUNITY)
Admission: RE | Disposition: A | Discharge status: Home / Self Care | Payer: Medicare Other | Source: Home / Self Care | Attending: Cardiology

## 2023-04-11 ENCOUNTER — Ambulatory Visit (HOSPITAL_COMMUNITY)
Admission: RE | Admit: 2023-04-11 | Discharge: 2023-04-12 | Disposition: A | Discharge status: Home / Self Care | Payer: Medicare Other | Attending: Cardiology | Admitting: Cardiology

## 2023-04-11 ENCOUNTER — Other Ambulatory Visit: Payer: Self-pay

## 2023-04-11 DIAGNOSIS — R0602 Shortness of breath: Secondary | ICD-10-CM

## 2023-04-11 DIAGNOSIS — I5022 Chronic systolic (congestive) heart failure: Secondary | ICD-10-CM | POA: Diagnosis not present

## 2023-04-11 DIAGNOSIS — I255 Ischemic cardiomyopathy: Secondary | ICD-10-CM

## 2023-04-11 DIAGNOSIS — N3281 Overactive bladder: Secondary | ICD-10-CM

## 2023-04-11 DIAGNOSIS — E66811 Obesity, class 1: Secondary | ICD-10-CM

## 2023-04-11 DIAGNOSIS — T148XXA Other injury of unspecified body region, initial encounter: Secondary | ICD-10-CM

## 2023-04-11 DIAGNOSIS — I110 Hypertensive heart disease with heart failure: Secondary | ICD-10-CM | POA: Insufficient documentation

## 2023-04-11 DIAGNOSIS — I517 Cardiomegaly: Secondary | ICD-10-CM

## 2023-04-11 DIAGNOSIS — I2510 Atherosclerotic heart disease of native coronary artery without angina pectoris: Secondary | ICD-10-CM | POA: Diagnosis not present

## 2023-04-11 DIAGNOSIS — M810 Age-related osteoporosis without current pathological fracture: Secondary | ICD-10-CM

## 2023-04-11 DIAGNOSIS — Z95810 Presence of automatic (implantable) cardiac defibrillator: Secondary | ICD-10-CM | POA: Diagnosis not present

## 2023-04-11 DIAGNOSIS — R935 Abnormal findings on diagnostic imaging of other abdominal regions, including retroperitoneum: Secondary | ICD-10-CM

## 2023-04-11 DIAGNOSIS — I5021 Acute systolic (congestive) heart failure: Secondary | ICD-10-CM

## 2023-04-11 DIAGNOSIS — R7989 Other specified abnormal findings of blood chemistry: Secondary | ICD-10-CM

## 2023-04-11 DIAGNOSIS — R748 Abnormal levels of other serum enzymes: Secondary | ICD-10-CM

## 2023-04-11 DIAGNOSIS — I420 Dilated cardiomyopathy: Secondary | ICD-10-CM

## 2023-04-11 DIAGNOSIS — J810 Acute pulmonary edema: Secondary | ICD-10-CM

## 2023-04-11 DIAGNOSIS — E559 Vitamin D deficiency, unspecified: Secondary | ICD-10-CM

## 2023-04-11 DIAGNOSIS — E1165 Type 2 diabetes mellitus with hyperglycemia: Secondary | ICD-10-CM

## 2023-04-11 DIAGNOSIS — R0601 Orthopnea: Secondary | ICD-10-CM

## 2023-04-11 DIAGNOSIS — E871 Hypo-osmolality and hyponatremia: Secondary | ICD-10-CM

## 2023-04-11 HISTORY — PX: ICD IMPLANT: EP1208

## 2023-04-11 LAB — GLUCOSE, CAPILLARY
Glucose-Capillary: 86 mg/dL (ref 70–99)
Glucose-Capillary: 76 mg/dL (ref 70–99)
Glucose-Capillary: 92 mg/dL (ref 70–99)

## 2023-04-11 LAB — MRSA NEXT GEN BY PCR, NASAL: MRSA by PCR Next Gen: NOT DETECTED

## 2023-04-11 SURGERY — ICD IMPLANT

## 2023-04-11 MED ORDER — VANCOMYCIN HCL IN DEXTROSE 1-5 GM/200ML-% IV SOLN
INTRAVENOUS | Status: DC | PRN
  Administered 2023-04-11: 1000 mg via INTRAVENOUS

## 2023-04-11 MED ORDER — FENTANYL CITRATE (PF) 100 MCG/2ML IJ SOLN
INTRAMUSCULAR | Status: DC | PRN
  Administered 2023-04-11: 25 ug via INTRAVENOUS

## 2023-04-11 MED ORDER — ONDANSETRON HCL 4 MG/2ML IJ SOLN: 4.0000 mg | Freq: Four times a day (QID) | INTRAMUSCULAR | Status: DC | PRN

## 2023-04-11 MED ORDER — LIDOCAINE HCL (PF) 1 % IJ SOLN
INTRAMUSCULAR | Status: AC
  Filled 2023-04-11: qty 60

## 2023-04-11 MED ORDER — SODIUM CHLORIDE 0.9 % IV SOLN: INTRAVENOUS | Status: DC

## 2023-04-11 MED ORDER — LEVOTHYROXINE SODIUM 75 MCG PO TABS
75.0000 ug | ORAL_TABLET | Freq: Every day | ORAL | Status: DC
Start: 2023-04-12 — End: 2023-04-12
  Administered 2023-04-12: 75 ug via ORAL
  Filled 2023-04-11: qty 1

## 2023-04-11 MED ORDER — SODIUM CHLORIDE 0.9 % IV SOLN
INTRAVENOUS | Status: AC
  Filled 2023-04-11: qty 2

## 2023-04-11 MED ORDER — FENTANYL CITRATE (PF) 100 MCG/2ML IJ SOLN
INTRAMUSCULAR | Status: AC
  Filled 2023-04-11: qty 2

## 2023-04-11 MED ORDER — LIDOCAINE HCL (PF) 1 % IJ SOLN
INTRAMUSCULAR | Status: DC | PRN
  Administered 2023-04-11: 50 mL

## 2023-04-11 MED ORDER — SPIRONOLACTONE 12.5 MG HALF TABLET
12.5000 mg | ORAL_TABLET | Freq: Every day | ORAL | Status: DC
  Administered 2023-04-12: 12.5 mg via ORAL
  Filled 2023-04-11: qty 1

## 2023-04-11 MED ORDER — MIDAZOLAM HCL 2 MG/2ML IJ SOLN
INTRAMUSCULAR | Status: AC
  Filled 2023-04-11: qty 2

## 2023-04-11 MED ORDER — ACETAMINOPHEN 325 MG PO TABS: 325.0000 mg | ORAL_TABLET | ORAL | Status: DC | PRN

## 2023-04-11 MED ORDER — SODIUM CHLORIDE 0.9 % IV SOLN
80.0000 mg | INTRAVENOUS | Status: AC
  Administered 2023-04-11: 80 mg

## 2023-04-11 MED ORDER — ASPIRIN 81 MG PO TBEC
81.0000 mg | DELAYED_RELEASE_TABLET | Freq: Every day | ORAL | Status: DC
  Administered 2023-04-12: 81 mg via ORAL
  Filled 2023-04-11: qty 1

## 2023-04-11 MED ORDER — CARVEDILOL 3.125 MG PO TABS
3.1250 mg | ORAL_TABLET | Freq: Two times a day (BID) | ORAL | Status: DC
  Administered 2023-04-12: 3.125 mg via ORAL
  Filled 2023-04-11: qty 1

## 2023-04-11 MED ORDER — POVIDONE-IODINE 10 % EX SWAB
2.0000 | Freq: Once | CUTANEOUS | Status: AC
  Administered 2023-04-11: 2 via TOPICAL

## 2023-04-11 MED ORDER — MIDAZOLAM HCL 5 MG/5ML IJ SOLN
INTRAMUSCULAR | Status: DC | PRN
  Administered 2023-04-11: 1 mg via INTRAVENOUS

## 2023-04-11 MED ORDER — HEPARIN (PORCINE) IN NACL 1000-0.9 UT/500ML-% IV SOLN
INTRAVENOUS | Status: DC | PRN
  Administered 2023-04-11: 500 mL

## 2023-04-11 MED ORDER — CEFAZOLIN SODIUM-DEXTROSE 2-4 GM/100ML-% IV SOLN: 2.0000 g | INTRAVENOUS | Status: DC

## 2023-04-11 MED ORDER — VANCOMYCIN HCL IN DEXTROSE 1-5 GM/200ML-% IV SOLN
INTRAVENOUS | Status: AC
  Filled 2023-04-11: qty 200

## 2023-04-11 SURGICAL SUPPLY — 7 items
CABLE SURGICAL S-101-97-12 (CABLE) ×2 IMPLANT
ICD VIGILANT VR D232 (Pacemaker) IMPLANT
LEAD RELIANCE 0672 IMPLANT
PAD DEFIB RADIO PHYSIO CONN (PAD) ×2 IMPLANT
SHEATH 8FR PRELUDE SNAP 13 (SHEATH) IMPLANT
SHEATH PROBE COVER 6X72 (BAG) IMPLANT
TRAY PACEMAKER INSERTION (PACKS) ×2 IMPLANT

## 2023-04-11 NOTE — Interval H&P Note (Signed)
**Note Whitney-Identified via Obfuscation** History and Physical Interval Note:  04/11/2023 1:59 PM  Whitney Knight  has presented today for surgery, with the diagnosis of heart failure.  The various methods of treatment have been discussed with the patient and family. After consideration of risks, benefits and other options for treatment, the patient has consented to  Procedure(s): ICD IMPLANT (N/A) as a surgical intervention.  The patient's history has been reviewed, patient examined, no change in status, stable for surgery.  I have reviewed the patient's chart and labs.  Questions were answered to the patient's satisfaction.     Whitney Knight T Christoph Copelan

## 2023-04-12 ENCOUNTER — Telehealth: Payer: Self-pay

## 2023-04-12 ENCOUNTER — Ambulatory Visit (HOSPITAL_COMMUNITY): Payer: Medicare Other

## 2023-04-12 ENCOUNTER — Encounter (HOSPITAL_COMMUNITY): Payer: Self-pay | Admitting: Cardiology

## 2023-04-12 ENCOUNTER — Ambulatory Visit: Payer: Medicare Other

## 2023-04-12 DIAGNOSIS — Z95810 Presence of automatic (implantable) cardiac defibrillator: Secondary | ICD-10-CM

## 2023-04-12 DIAGNOSIS — I110 Hypertensive heart disease with heart failure: Secondary | ICD-10-CM | POA: Diagnosis not present

## 2023-04-12 DIAGNOSIS — I5022 Chronic systolic (congestive) heart failure: Secondary | ICD-10-CM | POA: Diagnosis not present

## 2023-04-12 DIAGNOSIS — I2510 Atherosclerotic heart disease of native coronary artery without angina pectoris: Secondary | ICD-10-CM | POA: Diagnosis not present

## 2023-04-12 DIAGNOSIS — S2249XA Multiple fractures of ribs, unspecified side, initial encounter for closed fracture: Secondary | ICD-10-CM | POA: Diagnosis not present

## 2023-04-12 DIAGNOSIS — I255 Ischemic cardiomyopathy: Secondary | ICD-10-CM | POA: Diagnosis not present

## 2023-04-12 MED FILL — Midazolam HCl Inj 2 MG/2ML (Base Equivalent): INTRAMUSCULAR | Qty: 1 | Status: AC

## 2023-04-12 NOTE — Discharge Instructions (Addendum)
After Your ICD (Implantable Cardiac Defibrillator)   You have a Environmental manager ICD  ACTIVITY Do not lift your arm above shoulder height for 1 week after your procedure. After 7 days, you may progress as below.  You should remove your sling 24 hours after your procedure, unless otherwise instructed by your provider.     Thursday April 19, 2023  Friday April 20, 2023 Saturday April 21, 2023 Sunday April 22, 2023   Do not lift, push, pull, or carry anything over 10 pounds with the affected arm until 6 weeks (Thursday May 24, 2023 ) after your procedure.   You may drive AFTER your wound check, unless you have been told otherwise by your provider.   Ask your healthcare provider when you can go back to work   INCISION/Dressing If you are on a blood thinner such as Coumadin, Xarelto, Eliquis, Plavix, or Pradaxa please confirm with your provider when this should be resumed.  RESUME your Brilinta (Ticagrelor) on 04/14/23  If large square, outer bandage is left in place, this can be removed after 24 hours from your procedure. Do not remove steri-strips or glue as below.   Monitor your defibrillator site for redness, swelling, and drainage. Call the device clinic at [phone] if you experience these symptoms or fever/chills.  If your incision is sealed with Steri-strips or staples, you may shower 7 days after your procedure or when told by your provider. Do not remove the steri-strips or let the shower hit directly on your site. You may wash around your site with soap and water.    If you were discharged in a sling, please do not wear this during the day more than 48 hours after your surgery unless otherwise instructed. This may increase the risk of stiffness and soreness in your shoulder.   Avoid lotions, ointments, or perfumes over your incision until it is well-healed.  You may use a hot tub or a pool AFTER your wound check appointment if the incision is completely  closed.  Your ICD is designed to protect you from life threatening heart rhythms. Because of this, you may receive a shock.   1 shock with no symptoms:  Call the office during business hours. 1 shock with symptoms (chest pain, chest pressure, dizziness, lightheadedness, shortness of breath, overall feeling unwell):  Call 911. If you experience 2 or more shocks in 24 hours:  Call 911. If you receive a shock, you should not drive for 6 months per the [place] DMV IF you receive appropriate therapy from your ICD.   ICD Alerts:  Some alerts are vibratory and others beep. These are NOT emergencies. Please call our office to let us know. If this occurs at night or on weekends, it can wait until the next business day. Send a remote transmission.  If your device is capable of reading fluid status (for heart failure), you will be offered monthly monitoring to review this with you.   DEVICE MANAGEMENT Remote monitoring is used to monitor your ICD from home. This monitoring is scheduled every 91 days by our office. It allows [place] to keep an eye on the functioning of your device to ensure it is working properly. You will routinely see your Electrophysiologist annually (more often if necessary).   You should receive your ID card for your new device in 4-8 weeks. Keep this card with you at all times once received. Consider wearing a medical alert bracelet or necklace.  Your ICD  may be MRI compatible.  This will be discussed at your next office visit/wound check.  You should avoid contact with strong electric or magnetic fields.   Do not use amateur (ham) radio equipment or electric (arc) welding torches. MP3 player headphones with magnets should not be used. Some devices are safe to use if held at least 12 inches (30 cm) from your defibrillator. These include power tools, lawn mowers, and speakers. If you are unsure if something is safe to use, ask your health care provider.  When using your cell phone, hold it to  the ear that is on the opposite side from the defibrillator. Do not leave your cell phone in a pocket over the defibrillator.  You may safely use electric blankets, heating pads, computers, and microwave ovens.  Call the office right away if: You have chest pain. You feel more than one shock. You feel more short of breath than you have felt before. You feel more light-headed than you have felt before. Your incision starts to open up.  This information is not intended to replace advice given to you by your health care provider. Make sure you discuss any questions you have with your health care provider.

## 2023-04-12 NOTE — Telephone Encounter (Signed)
Follow-up after same day discharge: Implant date: 04/11/2023 MD: Steffanie Dunn Device: [facility] [employer] D232 Vigilant EL ICD  Location: Left Chest   Wound check visit: 04/26/2023 90 day MD follow-up: 07/11/2023  Remote Transmission received:Yes  Dressing/sling removed: yes   Confirm OAC restart on: 04/14/2023  Please continue to monitor your cardiac device site for redness, swelling, and drainage. Call the device clinic at [phone] if you experience these symptoms, fever/chills, or have questions about your device.   Remote monitoring is used to monitor your cardiac device from home. This monitoring is scheduled every 91 days by our office. It allows [place] to keep an eye on the functioning of your device to ensure it is working properly.

## 2023-04-12 NOTE — Discharge Summary (Signed)
ELECTROPHYSIOLOGY PROCEDURE DISCHARGE SUMMARY    Patient ID: Whitney Knight,  MRN: [id number], DOB/AGE: Feb 09, 1951 72 y.o.  Admit date: 04/11/2023 Discharge date: 04/12/2023  Primary Care Physician: Glori Luis, MD  Primary Cardiologist: Dr. Okey Dupre Electrophysiologist: Dr. Lalla Brothers  Primary Discharge Diagnosis:  Ischemic cardiomyopathy  Secondary Discharge Diagnosis:  CAD 11/2022 with a late presenting anterior ST elevation MI  occluded proximal to mid LAD which was treated with PCI/DES  Chronic CHF (systolic) Currently compensated DM HTN HLD hypothyroidism  Allergies  Allergen Reactions   Penicillins Swelling    Throat swelling   Prednisone Anaphylaxis and Swelling    Patient states it "closed her esophagus and she had to get it stretched."   Clarithromycin Nausea Only   Jardiance [Empagliflozin] Nausea And Vomiting     Procedures This Admission:  1.  Implantation of a BSCi single chamber ICD on 04/11/23 by Dr Lalla Brothers.   There were no immediate post procedure complications. 2.  CXR on 04/12/23 demonstrated no pneumothorax status post device implantation.   Brief HPI: Whitney Knight is a 72 y.o. female was referred to electrophysiology in the outpatient setting for consideration of ICD implantation.  Past medical history includes above.  The patient has persistent LV dysfunction despite guideline directed therapy.  Risks, benefits, and alternatives to ICD implantation were reviewed with the patient who wished to proceed.   Hospital Course:  The patient was admitted and underwent implantation of an ICD with details as outlined above. She was monitored on telemetry overnight which demonstrated SR.  Left chest was without hematoma or ecchymosis.  The device was interrogated and found to be functioning normally.  CXR was obtained and demonstrated no pneumothorax status post device implantation.  Wound care, arm mobility, and restrictions were reviewed with  the patient.  The patient feels well, denies any CP or SOB, minimal site discomfort, she was examined by Dr. Lalla Brothers and considered stable for discharge to home.   The patient's discharge medications include an ACE/ARB (unable to maintain ARB 2/2 hypotension) and beta blocker (carvedilol).   Resume Brilinta on 04/14/23  Physical Exam: Vitals:   04/12/23 0000 04/12/23 0500 04/12/23 0741 04/12/23 0743  BP: 131/80 (!) 146/77 (!) 151/89 (!) 140/77  Pulse: 67 88 78 76  Resp: 15 17 16 16   Temp: 97.6 F (36.4 C) 97.6 F (36.4 C) 97.6 F (36.4 C)   TempSrc: Oral Oral Oral   SpO2: 96% 96% 100% 96%  Weight:      Height:        GEN- The patient is well appearing, alert and oriented x 3 today.   HEENT: normocephalic, atraumatic; sclera clear, conjunctiva pink; hearing intact; oropharynx clear Lungs-  CTA b/l, normal work of breathing.  No wheezes, rales, rhonchi Heart- RRR, no murmurs, rubs or gallops, PMI not laterally displaced GI- soft, non-tender, non-distended Extremities- no clubbing, cyanosis, or edema MS- no significant deformity or atrophy Skin- warm and dry, no rash or lesion, left chest without hematoma/ecchymosis Psych- euthymic mood, full affect Neuro- no gross defecits  Labs:   Lab Results  Component Value Date   WBC 9.5 03/21/2023   HGB 14.1 03/21/2023   HCT 44.4 03/21/2023   MCV 87 03/21/2023   PLT 225 03/21/2023   No results for input(s): "NA", "K", "CL", "CO2", "BUN", "CREATININE", "CALCIUM", "PROT", "BILITOT", "ALKPHOS", "ALT", "AST", "GLUCOSE" in the last 168 hours.  Invalid input(s): "LABALBU"  Discharge Medications:  Allergies as of 04/12/2023  Reactions   Penicillins Swelling   Throat swelling   Prednisone Anaphylaxis, Swelling   Patient states it "closed her esophagus and she had to get it stretched."   Clarithromycin Nausea Only   Jardiance [empagliflozin] Nausea And Vomiting        Medication List     TAKE these medications     aspirin EC 81 MG tablet Take 1 tablet (81 mg total) by mouth daily. Swallow whole.   blood glucose meter kit and supplies Kit One touch ultra mini. Use 2-4 times per day.   carvedilol 6.25 MG tablet Commonly known as: COREG Take 0.5 tablets (3.125 mg total) by mouth 2 (two) times daily with a meal.   cetirizine 10 MG tablet Commonly known as: ZYRTEC Take 10 mg by mouth daily.   COQ10 PO Take 250 mg by mouth every evening.   dapagliflozin propanediol 10 MG Tabs tablet Commonly known as: FARXIGA Take 1 tablet (10 mg total) by mouth daily.   Dexcom G7 Receiver Devi Use to check glucose at least every 8 hours.   Dexcom G7 Sensor Misc Apply every 10 days   Florastor 250 MG capsule Generic drug: saccharomyces boulardii Take 1 capsule (250 mg total) by mouth daily.   fluticasone 50 MCG/ACT nasal spray Commonly known as: FLONASE USE TWO SPRAYS IN EACH NOSTRIL DAILY   L-CARNITINE PO Take 1 tablet by mouth in the morning and at bedtime.   levothyroxine 75 MCG tablet Commonly known as: SYNTHROID TAKE 1 TABLET BY MOUTH DAILY   Mounjaro 12.5 MG/0.5ML Pen Generic drug: tirzepatide Inject 12.5 mg into the skin once a week.   OneTouch Delica Plus Lancet33G Misc USE ONCE DAILY AS DIRECTED   OneTouch Ultra test strip Generic drug: glucose blood USE TO CHECK BLOOD SUGAR UP TO THREE TIMES DAILY   OVER THE COUNTER MEDICATION Take 1 capsule by mouth at bedtime. Oxy-Powder digestive support   spironolactone 25 MG tablet Commonly known as: ALDACTONE Take 0.5 tablets (12.5 mg total) by mouth daily.   ticagrelor 90 MG Tabs tablet Commonly known as: BRILINTA Take 1 tablet (90 mg total) by mouth 2 (two) times daily. Notes to patient: Do NOT resume until 04/14/23        Disposition: Home Discharge Instructions     Diet - low sodium heart healthy   Complete by: As directed    Increase activity slowly   Complete by: As directed         Duration of Discharge  Encounter: Greater than 30 minutes including physician time.  Norma Fredrickson, PA-C 04/12/2023 11:07 AM

## 2023-04-12 NOTE — Plan of Care (Signed)
  Problem: Education: Goal: Knowledge of cardiac device and self-care will improve Outcome: Adequate for Discharge Goal: Ability to safely manage health related needs after discharge will improve Outcome: Adequate for Discharge Goal: Individualized Educational Video(s) Outcome: Adequate for Discharge   Problem: Cardiac: Goal: Ability to achieve and maintain adequate cardiopulmonary perfusion will improve Outcome: Adequate for Discharge   Problem: Education: Goal: Knowledge of General Education information will improve Description: Including pain rating scale, medication(s)/side effects and non-pharmacologic comfort measures Outcome: Adequate for Discharge   Problem: Health Behavior/Discharge Planning: Goal: Ability to manage health-related needs will improve Outcome: Adequate for Discharge   Problem: Clinical Measurements: Goal: Ability to maintain clinical measurements within normal limits will improve Outcome: Adequate for Discharge Goal: Will remain free from infection Outcome: Adequate for Discharge Goal: Diagnostic test results will improve Outcome: Adequate for Discharge Goal: Respiratory complications will improve Outcome: Adequate for Discharge Goal: Cardiovascular complication will be avoided Outcome: Adequate for Discharge   Problem: Activity: Goal: Risk for activity intolerance will decrease Outcome: Adequate for Discharge   Problem: Nutrition: Goal: Adequate nutrition will be maintained Outcome: Adequate for Discharge   Problem: Coping: Goal: Level of anxiety will decrease Outcome: Adequate for Discharge   Problem: Elimination: Goal: Will not experience complications related to bowel motility Outcome: Adequate for Discharge Goal: Will not experience complications related to urinary retention Outcome: Adequate for Discharge   Problem: Pain Management: Goal: General experience of comfort will improve Outcome: Adequate for Discharge   Problem:  Safety: Goal: Ability to remain free from injury will improve Outcome: Adequate for Discharge   Problem: Skin Integrity: Goal: Risk for impaired skin integrity will decrease Outcome: Adequate for Discharge

## 2023-04-17 ENCOUNTER — Ambulatory Visit: Payer: Medicare Other

## 2023-04-17 ENCOUNTER — Other Ambulatory Visit: Payer: Self-pay | Admitting: Family Medicine

## 2023-04-19 ENCOUNTER — Encounter: Payer: Medicare Other | Admitting: Family Medicine

## 2023-04-19 ENCOUNTER — Ambulatory Visit: Payer: Medicare Other

## 2023-04-23 NOTE — Progress Notes (Unsigned)
  Cardiology Office Note:  .   Date:  04/23/2023  ID:  [name], DOB [birth year], MRN [id number] PCP: Glori Luis, MD   HeartCare Providers Cardiologist:  Yvonne Kendall, MD {  History of Present Illness: .   Whitney Knight is a 72 y.o. female w/PMHx of CAD (11/2022 with a late presenting anterior ST elevation MI occluded proximal to mid LAD which was treated with PCI/DES), ICM, chronic CHF, DM, HTN, HLD, hypothyroidism  Underwent ICD implant 04/11/23, primary prevention Brilinta to resume 04/14/23   Today's visit is scheduled as post implant wound check  ROS:   She is accompanied by her daughter No site concerns Feels well No CP, palpitations or cardiac awareness No bleeding or pain at her site Resumed brilinta as instructed, reports that the initial swelling is improving/not yet resolved No near syncope or syncope. Looking forward to atrio next month to [place], and in the summer to [employer] BSCi single chamber ICD implant 04/11/23   Studies Reviewed: [name]    EKG not done today  DEVICE interrogation done today and reviewed by myself Battery and lead measurements are good No arrhythmias Acute implant output remains   C. MRI 03/28/23 IMPRESSION: 1. Severely reduced LV systolic function.  LVEF 28%. 2. Transmural LGE/scar present in the LV mid-apical anterior, mid-apical anteroseptal, and apical walls. 3. Normal RV size and function. 4. No significant valvular abnormalities. 5. Findings consistent with ischemic cardiomyopathy, prior infarct involving LAD territory.   Risk Assessment/Calculations:    Physical Exam:   VS:  There were no vitals taken for this visit.   Wt Readings from Last 3 Encounters:  04/11/23 132 lb 4.4 oz (60 kg)  03/21/23 131 lb (59.4 kg)  03/21/23 131 lb 9.6 oz (59.7 kg)    GEN: Well nourished, well developed in no acute distress NECK: No JVD; No carotid bruits CARDIAC: RRR, no murmurs, rubs,  gallops RESPIRATORY:  CTA b/l without rales, wheezing or rhonchi  ABDOMEN: Soft, non-tender, non-distended EXTREMITIES:  No edema; No deformity   ICD site: steri strips are removed without difficulty, wound edges are well approximated, there is fluid in the pocket, soft, nontender, not a hematoma, no ecchymosis remains, no heat/erythema, drainage, no signs of infection  ASSESSMENT AND PLAN: .    ICD Wound edges are well healed There is fluid, soft, and not a hematoma, pt reports less swollen then it was initially post implant Discussed monitor the swelling, if it doesn't continue to improve, or if it increases or becomes painful to let us know    Remain LUE restrictions reviewed  ICM Chronic CHF (systolic) No symptoms or exam findings of volume OL Heart logic score is initializing On BB, farxiga, spiro C/w End/team  CAD No anginal symptoms On ASA, brilinta, BB C/w Dr. Cameron Ali    Dispo: will have her see EP APP in 2 weeks (they prefer [place] office) in 2 weeks to ensure pocket swelling continues to improve/resolves.   Signed, Sheilah Pigeon, PA-C

## 2023-04-24 ENCOUNTER — Ambulatory Visit: Payer: Medicare Other

## 2023-04-26 ENCOUNTER — Ambulatory Visit: Payer: Medicare Other

## 2023-04-26 ENCOUNTER — Ambulatory Visit: Payer: Medicare Other | Attending: Physician Assistant | Admitting: Physician Assistant

## 2023-04-26 ENCOUNTER — Encounter: Payer: Self-pay | Admitting: Physician Assistant

## 2023-04-26 VITALS — BP 114/78 | HR 60 | Ht 63.5 in | Wt 135.2 lb

## 2023-04-26 DIAGNOSIS — I5022 Chronic systolic (congestive) heart failure: Secondary | ICD-10-CM

## 2023-04-26 DIAGNOSIS — Z5189 Encounter for other specified aftercare: Secondary | ICD-10-CM

## 2023-04-26 DIAGNOSIS — Z95810 Presence of automatic (implantable) cardiac defibrillator: Secondary | ICD-10-CM

## 2023-04-26 DIAGNOSIS — I2510 Atherosclerotic heart disease of native coronary artery without angina pectoris: Secondary | ICD-10-CM | POA: Diagnosis not present

## 2023-04-26 DIAGNOSIS — I255 Ischemic cardiomyopathy: Secondary | ICD-10-CM

## 2023-04-26 LAB — CUP PACEART INCLINIC DEVICE CHECK
Date Time Interrogation Session: 20241226181850
HighPow Impedance: 74 Ohm
Implantable Lead Connection Status: 753985
Implantable Lead Implant Date: 20241211
Implantable Lead Location: 753860
Implantable Lead Model: 672
Implantable Lead Serial Number: 263510
Implantable Pulse Generator Implant Date: 20241211
Lead Channel Impedance Value: 399 Ohm
Lead Channel Pacing Threshold Amplitude: 0.8 V
Lead Channel Pacing Threshold Pulse Width: 0.4 ms
Lead Channel Sensing Intrinsic Amplitude: 9.1 mV
Lead Channel Setting Pacing Amplitude: 3.5 V
Lead Channel Setting Pacing Pulse Width: 0.4 ms
Lead Channel Setting Sensing Sensitivity: 0.5 mV
Pulse Gen Serial Number: 340837
Zone Setting Status: 755011

## 2023-04-26 NOTE — Patient Instructions (Addendum)
Medication Instructions:   Your physician recommends that you continue on your current medications as directed. Please refer to the Current Medication list given to you today.   *If you need a refill on your cardiac medications before your next appointment, please call your pharmacy*   Lab Work: NONE ORDERED  TODAY    If you have labs (blood work) drawn today and your tests are completely normal, you will receive your results only by: MyChart Message (if you have MyChart) OR A paper copy in the mail If you have any lab test that is abnormal or we need to change your treatment, we will call you to review the results.   Testing/Procedures: NONE ORDERED  TODAY     Follow-Up: At [facility], you and your health needs are our priority.  As part of our continuing mission to provide you with exceptional heart care, we have created designated Provider Care Teams.  These Care Teams include your primary Cardiologist (physician) and Advanced Practice Providers (APPs -  Physician Assistants and Nurse Practitioners) who all work together to provide you with the care you need, when you need it.  We recommend signing up for the patient portal called "MyChart".  Sign up information is provided on this After Visit Summary.  MyChart is used to connect with patients for Virtual Visits (Telemedicine).  Patients are able to view lab/test results, encounter notes, upcoming appointments, etc.  Non-urgent messages can be sent to your provider as well.   To learn more about what you can do with MyChart, go to [email or web address].    Your next appointment:  AS SCHEDULED   Provider:    You may see  or one of the following Advanced Practice Providers on your designated Care Team:   Sherie Don, NP    Other Instructions

## 2023-05-01 ENCOUNTER — Ambulatory Visit: Payer: Medicare Other

## 2023-05-01 ENCOUNTER — Other Ambulatory Visit: Payer: Self-pay

## 2023-05-03 ENCOUNTER — Ambulatory Visit: Payer: Medicare Other

## 2023-05-10 NOTE — Progress Notes (Signed)
 Cardiology Office Note:  .   Date:  05/10/2023  ID:  Whitney Knight, DOB 1951-04-05, MRN [id number] PCP: Maribeth Camellia MATSU, MD  [facility] Providers Cardiologist:  Lonni Hanson, MD {  History of Present Illness: .   Whitney Knight is a 73 y.o. female w/PMHx of CAD (11/2022 with a late presenting anterior ST elevation MI occluded proximal to mid LAD which was treated with PCI/DES), ICM, chronic CHF, DM, HTN, HLD, hypothyroidism  Underwent ICD implant 04/11/23, primary prevention Brilinta  to resume 04/14/23  I saw her 04/26/23 She is accompanied by her daughter No site concerns Feels well No CP, palpitations or cardiac awareness No bleeding or pain at her site Resumed brilinta  as instructed, reports that the initial swelling is improving/not yet resolved No near syncope or syncope. Looking forward to atrio next month to [place], and in the summer to [employer] edges well healed She had some fluid in the pocket, not tender, not a hematoma, no sigfns of infection Reported as improved from day or tow after implant Aug 2024 late presenting STEMI and reported decreasing in size >> no changes to her DAPT with plans to see her again to f/u advised to call if it did not continue to improve, decrease in size/resolve  Today's visit is scheduled as post implant wound check  ROS:   Swelling has resolved She feels quite well No pain, or site concerns She inquires about reflux medication and hair supplements again She has been found with a  few cavities, and needs a couple dental implants, asks about timing for that, suspects for the implants they will want her off her brlinta  Device information [employer] single chamber ICD implant 04/11/23   Studies Reviewed: SABRA    EKG not done today  DEVICE interrogation  Not done today, normal device check on 04/26/23   C. MRI 03/28/23 IMPRESSION: 1. Severely reduced LV systolic function.  LVEF 28%. 2. Transmural LGE/scar present in the  LV mid-apical anterior, mid-apical anteroseptal, and apical walls. 3. Normal RV size and function. 4. No significant valvular abnormalities. 5. Findings consistent with ischemic cardiomyopathy, prior infarct involving LAD territory.   Risk Assessment/Calculations:    Physical Exam:   VS:  There were no vitals taken for this visit.   Wt Readings from Last 3 Encounters:  04/26/23 135 lb 3.2 oz (61.3 kg)  04/11/23 132 lb 4.4 oz (60 kg)  03/21/23 131 lb (59.4 kg)    GEN: Well nourished, well developed in no acute distress NECK: No JVD; No carotid bruits CARDIAC: RRR, no murmurs, rubs, gallops RESPIRATORY:  CTA b/l without rales, wheezing or rhonchi  ABDOMEN: Soft, non-tender, non-distended EXTREMITIES:  No edema; No deformity   ICD site:wound edges are well, fluid collection has resolved, no heat/erythema, drainage, no signs of infection  ASSESSMENT AND PLAN: .    ICD Remaining LUE restrictions reviewed Site looks great Dental work in a month or so from ICD perspective would be OK  She has a heated vest, I asked [employer] rep, uncertain about safety with her ICD, advised that she call the patient support line with the product information for formal recommendation on safety If unable to help her, he would suggest she come back in with him in the office to check her device with the vest on/turned on  Advised not to use it until [facility] has established it's safety    ICM Chronic CHF (systolic) No symptoms or exam findings of volume OL On BB,  farxiga , spiro C/w End/team  CAD No anginal symptoms On ASA, brilinta , BB Reviewed her DIVI vitamin with RPH in clinic, advises against it with her DAPT, she recommended Biotin as a supplement that would be OK I will send a message to [facility] regarding his recommendation for reflux medication, and their recommendation on timing of dental implant and her DAPT, she is looking to get it done in the next month or so C/w Dr. March    Dispo: f/u  at her 52 day visit as scheduled, sooner if needed   Signed, Charlies Macario Arthur, PA-C

## 2023-05-11 ENCOUNTER — Encounter: Payer: Self-pay | Admitting: Physician Assistant

## 2023-05-11 ENCOUNTER — Ambulatory Visit: Payer: Medicare Other | Attending: Physician Assistant | Admitting: Physician Assistant

## 2023-05-11 VITALS — BP 118/78 | HR 62 | Resp 16 | Ht 63.0 in | Wt 133.4 lb

## 2023-05-11 DIAGNOSIS — I255 Ischemic cardiomyopathy: Secondary | ICD-10-CM | POA: Diagnosis not present

## 2023-05-11 DIAGNOSIS — Z5189 Encounter for other specified aftercare: Secondary | ICD-10-CM | POA: Diagnosis not present

## 2023-05-11 DIAGNOSIS — I2510 Atherosclerotic heart disease of native coronary artery without angina pectoris: Secondary | ICD-10-CM

## 2023-05-11 NOTE — Patient Instructions (Signed)
Medication Instructions:    Your physician recommends that you continue on your current medications as directed. Please refer to the Current Medication list given to you today.  *If you need a refill on your cardiac medications before your next appointment, please call your pharmacy*   Lab Work: NONE ORDERED  TODAY    If you have labs (blood work) drawn today and your tests are completely normal, you will receive your results only by: MyChart Message (if you have MyChart) OR A paper copy in the mail If you have any lab test that is abnormal or we need to change your treatment, we will call you to review the results.   Testing/Procedures: NONE ORDERED  TODAY      Follow-Up: At [facility], you and your health needs are our priority.  As part of our continuing mission to provide you with exceptional heart care, we have created designated Provider Care Teams.  These Care Teams include your primary Cardiologist (physician) and Advanced Practice Providers (APPs -  Physician Assistants and Nurse Practitioners) who all work together to provide you with the care you need, when you need it.  We recommend signing up for the patient portal called "MyChart".  Sign up information is provided on this After Visit Summary.  MyChart is used to connect with patients for Virtual Visits (Telemedicine).  Patients are able to view lab/test results, encounter notes, upcoming appointments, etc.  Non-urgent messages can be sent to your provider as well.   To learn more about what you can do with MyChart, go to [email or web address].    Your next appointment:  AS SCHEDULED    Other Instructions  

## 2023-05-17 ENCOUNTER — Other Ambulatory Visit: Payer: Self-pay

## 2023-05-18 ENCOUNTER — Encounter: Payer: Self-pay | Admitting: Family Medicine

## 2023-05-25 ENCOUNTER — Other Ambulatory Visit: Payer: Self-pay | Admitting: Obstetrics and Gynecology

## 2023-05-25 ENCOUNTER — Other Ambulatory Visit: Payer: Self-pay

## 2023-05-28 MED ORDER — SPIRONOLACTONE 25 MG PO TABS: 12.5000 mg | ORAL_TABLET | Freq: Every day | ORAL | 1 refills | Status: DC

## 2023-06-11 ENCOUNTER — Other Ambulatory Visit: Payer: Self-pay | Admitting: Obstetrics and Gynecology

## 2023-06-22 ENCOUNTER — Ambulatory Visit: Payer: Medicare Other | Attending: Physician Assistant | Admitting: Physician Assistant

## 2023-06-22 ENCOUNTER — Encounter: Payer: Self-pay | Admitting: Physician Assistant

## 2023-06-22 VITALS — BP 137/82 | HR 71 | Ht 63.0 in | Wt 131.8 lb

## 2023-06-22 DIAGNOSIS — I10 Essential (primary) hypertension: Secondary | ICD-10-CM | POA: Diagnosis not present

## 2023-06-22 DIAGNOSIS — I255 Ischemic cardiomyopathy: Secondary | ICD-10-CM

## 2023-06-22 DIAGNOSIS — I5020 Unspecified systolic (congestive) heart failure: Secondary | ICD-10-CM

## 2023-06-22 DIAGNOSIS — E785 Hyperlipidemia, unspecified: Secondary | ICD-10-CM

## 2023-06-22 DIAGNOSIS — Z789 Other specified health status: Secondary | ICD-10-CM

## 2023-06-22 DIAGNOSIS — I2510 Atherosclerotic heart disease of native coronary artery without angina pectoris: Secondary | ICD-10-CM | POA: Diagnosis not present

## 2023-06-22 DIAGNOSIS — Z95810 Presence of automatic (implantable) cardiac defibrillator: Secondary | ICD-10-CM | POA: Diagnosis not present

## 2023-06-22 NOTE — Progress Notes (Signed)
Cardiology Office Note    Date:  06/22/2023   ID:  Whitney Knight, DOB Sep 24, 1950, MRN [id number]  PCP:  Glori Luis, MD  Cardiologist:  Yvonne Kendall, MD  Electrophysiologist:  Lanier Prude, MD   Chief Complaint: Follow up  History of Present Illness:   Whitney Knight is a 73 y.o. female with history of CAD with late presenting anterior ST elevation MI, HFrEF secondary to ICM status post ICD in 04/2023, DM2, HTN, HLD, and hypothyroidism who presents for follow up of CAD and cardiomyopathy.   Calcium score in 2022 of 37.1 which was the 64th percentile.  She was admitted to [facility] in 11/2022 with a late presenting anterior ST elevation MI and acute HFrEF.  Initially, concerns were for myocarditis given multiple sick contacts.  Troponin peaked at 2024.  BNP 1663.  COVID/respiratory panel negative.  Echo demonstrated an EF of 30 to 35%, WMA, G2DD, normal RV systolic function, ventricular cavity size, and PASP, mildly dilated left atrium, moderate pleural effusion in the left and right lateral regions, mild to moderate mitral regurgitation, moderate tricuspid regurgitation, and an estimated right atrial pressure of 3 mmHg.  R/LHC showed an occluded proximal to mid LAD which was treated with PCI/DES (overall difficult procedure).  Otherwise, no significant disease in a somewhat codominant system with a wraparound LAD providing the distal half to one third of PDA territory.  Hemodynamic findings were consistent with mild pulmonary hypertension.  She had symptomatic improvement with PCI, IV diuresis, and escalation of GDMT.  Discharged home with LifeVest.  Has preferred red yeast rice and CoQ10 over statin.  Relative hypotension has precluded escalation of GDMT.  She was evaluated by the advanced heart failure service and initiated on candesartan which was subsequently discontinued with hypotension into the 80s systolic.  Furosemide was transitioned to as needed.  Limited echo on 03/19/2023  showed an EF of 25 to 30%, global hypokinesis, grade 2 diastolic dysfunction, low normal RV systolic function, mild mitral regurgitation, and an estimated right atrial pressure of 3 mmHg.  Self discontinued LifeVest due to connectivity and alarm issues leading to increased anxiety.  She was evaluated by EP and underwent ICD implantation on 04/11/2023.  She comes in doing very well from a cardiac perspective and is without symptoms of angina or cardiac decompensation.  She does note some positional dizziness if she stands quickly.  No presyncope or syncope.  No falls, hematochezia, or melena.  She ambulates usually around 3 miles per day on flat ground.  She does note some shortness of breath if ambulating up an incline on her treadmill.  Adherent and tolerating cardiac medications including DAPT.  Indicates that she needs several dental extractions.  Weight stable.  Requests renewal of handicap placard given dyspnea when ambulating up an incline.  Continues to deal with Raynaud's.   Labs independently reviewed: 03/2023 - BUN 25, serum creatinine 0.9, potassium 4.6, Hgb 14.1, PLT 225 12/2022 - A1c 6.6, BNP 345 11/2022 - BNP 423, magnesium 2.4 11/2022 - LP(a) 48.3, TC 141, TG 91, HDL 36, LDL 87, TSH normal 07/2022 - A1c 7.0 05/2022 - albumin 4.2, AST/ALT normal  Past Medical History:  Diagnosis Date   Allergy    Anemia    Arthritis    KNEES   Cataract    Chronic sinusitis    Cough    Diabetes mellitus    Diverticulosis    GERD (gastroesophageal reflux disease)    Headache(784.0)    [place]  Neurological in past   Hypertension    Osteoporosis    Thyroid disease     Past Surgical History:  Procedure Laterality Date   bone graft     for dental surgery   CARPAL TUNNEL RELEASE Right 02/27/2013   [facility]   COLONOSCOPY  2014   CORONARY STENT INTERVENTION N/A 12/14/2022   Procedure: CORONARY STENT INTERVENTION;  Surgeon: Marykay Lex, MD;  Location: [facility] INVASIVE CV LAB;  Service:  Cardiovascular;  Laterality: N/A;   dental implants     DENTAL SURGERY     DENTAL SURGERY     dental implant   ICD IMPLANT N/A 04/11/2023   Procedure: ICD IMPLANT;  Surgeon: Lanier Prude, MD;  Location: [facility] INVASIVE CV LAB;  Service: Cardiovascular;  Laterality: N/A;   NASAL SINUS SURGERY  06/2008   Dr. Jenne Campus   RIGHT/LEFT HEART CATH AND CORONARY ANGIOGRAPHY N/A 12/14/2022   Procedure: RIGHT/LEFT HEART CATH AND CORONARY ANGIOGRAPHY;  Surgeon: Marykay Lex, MD;  Location: [facility] INVASIVE CV LAB;  Service: Cardiovascular;  Laterality: N/A;   TRIGGER FINGER RELEASE Right 02/27/2013   [facility]   UPPER GASTROINTESTINAL ENDOSCOPY     VAGINAL DELIVERY     2    Current Medications: Current Meds  Medication Sig   aspirin EC 81 MG tablet Take 1 tablet (81 mg total) by mouth daily. Swallow whole.   blood glucose meter kit and supplies KIT One touch ultra mini. Use 2-4 times per day.   carvedilol (COREG) 6.25 MG tablet Take 0.5 tablets (3.125 mg total) by mouth 2 (two) times daily with a meal.   cetirizine (ZYRTEC) 10 MG tablet Take 10 mg by mouth daily.   Coenzyme Q10 (COQ10 PO) Take 250 mg by mouth every evening.   dapagliflozin propanediol (FARXIGA) 10 MG TABS tablet Take 1 tablet (10 mg total) by mouth daily.   fluticasone (FLONASE) 50 MCG/ACT nasal spray USE TWO SPRAYS IN EACH NOSTRIL DAILY   Lancets (ONETOUCH DELICA PLUS LANCET33G) MISC USE ONCE DAILY AS DIRECTED   levOCARNitine (L-CARNITINE PO) Take 1 tablet by mouth in the morning and at bedtime.   levothyroxine (SYNTHROID) 75 MCG tablet TAKE 1 TABLET BY MOUTH DAILY   ONETOUCH ULTRA test strip USE TO CHECK BLOOD SUGAR UP TO THREE TIMES DAILY   OVER THE COUNTER MEDICATION Take 1 capsule by mouth at bedtime. Oxy-Powder digestive support   saccharomyces boulardii (FLORASTOR) 250 MG capsule Take 1 capsule (250 mg total) by mouth daily.   spironolactone (ALDACTONE) 25 MG tablet Take 0.5 tablets (12.5 mg total) by mouth daily.   ticagrelor  (BRILINTA) 90 MG TABS tablet Take 1 tablet (90 mg total) by mouth 2 (two) times daily.   tirzepatide (MOUNJARO) 12.5 MG/0.5ML Pen Inject 12.5 mg into the skin once a week.    Allergies:   Penicillins, Prednisone, Clarithromycin, and Jardiance [empagliflozin]   Social History   Socioeconomic History   Marital status: Married    Spouse name: Not on file   Number of children: 2   Years of education: Not on file   Highest education level: Not on file  Occupational History   Occupation: [employer]    Employer: [employer]  Tobacco Use   Smoking status: Never   Smokeless tobacco: Never  Vaping Use   Vaping status: Never Used  Substance and Sexual Activity   Alcohol use: Yes    Comment: occ   Drug use: No   Sexual activity: Not on file  Other Topics  Concern   Not on file  Social History Narrative   Lives with husband. No pets, 2 children.      Work - [employer]      Diet - regular   Exercise - none presently   Social Drivers of Corporate investment banker Strain: Patient Declined (12/12/2022)   Overall Financial Resource Strain (CARDIA)    Difficulty of Paying Living Expenses: Patient declined  Food Insecurity: No Food Insecurity (04/11/2023)   Hunger Vital Sign    Worried About Running Out of Food in the Last Year: Never true    Ran Out of Food in the Last Year: Never true  Transportation Needs: No Transportation Needs (04/12/2023)   PRAPARE - Administrator, Civil Service (Medical): No    Lack of Transportation (Non-Medical): No  Physical Activity: Sufficiently Active (12/12/2022)   Exercise Vital Sign    Days of Exercise per Week: 7 days    Minutes of Exercise per Session: 30 min  Stress: No Stress Concern Present (12/12/2022)   [employer] of Occupational Health - Occupational Stress Questionnaire    Feeling of Stress : Not at all  Social Connections: Unknown (12/12/2022)   Social Connection and Isolation Panel [NHANES]    Frequency of  Communication with Friends and Family: Patient declined    Frequency of Social Gatherings with Friends and Family: Patient declined    Attends Religious Services: Patient declined    Database administrator or Organizations: Patient declined    Attends Engineer, structural: More than 4 times per year    Marital Status: Married     Family History:  The patient's family history includes COPD in her father; Diabetes in her maternal grandmother; Heart disease in her father and mother; Hypertension in her father. There is no history of Colon cancer, Esophageal cancer, Pancreatic cancer, Stomach cancer, or Liver disease.  ROS:   12-point review of systems is negative unless otherwise noted in the HPI.   EKGs/Labs/Other Studies Reviewed:    Studies reviewed were summarized above. The additional studies were reviewed today:  Calcium score 02/24/2021: IMPRESSION AND RECOMMENDATION: 1. Coronary calcium score of 37.1. This was 64th percentile for age and sex matched control. 2. CAC 1-99 in LAD.  CAC-DRS A1/N1. 3. Continue heart healthy lifestyle and risk factor modification. 4. Consider statin therapy due to Age >55. __________   2D echo 12/13/2022: 1. Left ventricular ejection fraction, by estimation, is 30 to 35%. The  left ventricle has moderately decreased function. The left ventricle  demonstrates regional wall motion abnormalities (see scoring  diagram/findings for description). Left ventricular   diastolic parameters are consistent with Grade II diastolic dysfunction  (pseudonormalization). Elevated left atrial pressure. The average left  ventricular global longitudinal strain is -6.7 %. The global longitudinal  strain is abnormal.   2. Right ventricular systolic function is normal. The right ventricular  size is normal. There is normal pulmonary artery systolic pressure.   3. Left atrial size was mildly dilated.   4. Moderate pleural effusion in both left and right lateral  regions.   5. The mitral valve is abnormal. Mild to moderate mitral valve  regurgitation. No evidence of mitral stenosis.   6. Tricuspid valve regurgitation is moderate.   7. The aortic valve is tricuspid. Aortic valve regurgitation is not  visualized. No aortic stenosis is present.   8. The inferior vena cava is normal in size with greater than 50%  respiratory variability, suggesting right atrial  pressure of 3 mmHg.   Conclusion(s)/Recommendation(s): No left ventricular mural or apical thrombus/thrombi.  __________   [facility] 12/14/2022:   CULPRIT LESION: Prox LAD to Mid LAD lesion is 100% stenosed with 0% stenosed side branch in 1st Diag.   A drug-eluting stent was successfully placed using a STENT ONYX FRONTIER 2.5X30. => Taper postdilation from 3.1 to 2.8 mm   Post intervention, there is a 0% residual stenosis.  TIMI-3 flow restored.   Post intervention, the side branch was reduced to 0% residual stenosis distally (very small vessel); 1st Diag lesion is 100% stenosed distally.   Hemodynamic findings consistent with mild pulmonary hypertension.   There is no aortic valve stenosis.   Anticipated discharge date to be determined.   Patient still has need for additional titration of CHF/C medications along with additional diuresis   Recommend uninterrupted dual antiplatelet therapy with Aspirin 81mg  daily and Ticagrelor 90mg  twice daily for a minimum of 12 months (ACS-Class I recommendation).   Would continue Thienopyridine monotherapy with either clopidogrel 75 mg daily or ticagrelor 60 mg twice daily long-term based on 30 mm stent in the proximal LAD.   POST-OPERATIVE DIAGNOSIS:   Severe single-vessel disease with 100% occlusion of the prox-mid LAD at SP1 otherwise no significant disease in a somewhat codominant system with the wraparound LAD providing the distal half to one third of the PDA territory. Difficult but successful DES PCI of the LAD with a Resolute Onyx DES 2.5 mm x 30 mm stent  postdilated and tapered fashion from 3.1 to 2.8 mm.  2 small diagonal branches noted after stent with 1 small diagonal that barely feels that arose from the original occluded section. Consistent combined CHF with known EF of 30 to 35% and PCWP of 29 millimercury and LVEDP of 21 mL of mercury, but preserved cardiac output put and index with cardiac output of 5.44 and index of 3.16. RHC numbers: RAP 5 mmHg; RV P-EDP 46/0-8 mmHg; PAP-mean 46/19-32 mmHg (mild to moderate WHO class II-LV CHF related), PCWP 28 mmHg.  LV P-EDP 113/5-21 mmHg; AO P-MAP 104/68-81 mmHg.  Ao sat 90%, PA sat 68%.     PLAN OF CARE:  Patient continues to be inpatient.  I have written for IV Lasix 60 mg to be given today post cath (early PM dose).  Will continue with IV diuresis given elevated LVEDP and PCWP - INCREASE TO 60mg  BID starting tonite.  DAPT for minimum 1 year but would like to continue Thienopyridine for lifetime based on length of stent in the LAD.  Continue to titrate GDMT for CAD and CM/CHF.  Phase 2 CRH ordered. __________   Limited echo 03/19/2023: 1. Left ventricular ejection fraction, by estimation, is 25 to 30%. The  left ventricle has severely decreased function. The left ventricle  demonstrates global hypokinesis. Left ventricular diastolic parameters are  consistent with Grade II diastolic  dysfunction (pseudonormalization).   2. Right ventricular systolic function is low normal.   3. The mitral valve is normal in structure. Mild mitral valve  regurgitation.   4. The inferior vena cava is normal in size with greater than 50%  respiratory variability, suggesting right atrial pressure of 3 mmHg.  __________  cMRI 03/27/2023: IMPRESSION: 1. Severely reduced LV systolic function.  LVEF 28%. 2. Transmural LGE/scar present in the LV mid-apical anterior, mid-apical anteroseptal, and apical walls. 3. Normal RV size and function. 4. No significant valvular abnormalities. 5. Findings consistent with  ischemic cardiomyopathy, prior infarct involving LAD territory.  EKG:  EKG is ordered today.  The EKG ordered today demonstrates NSR, 71, nonspecific IVCD, prior septal infarct, nonspecific ST-T changes  Recent Labs: 12/12/2022: Pro B Natriuretic peptide (BNP) 1,663.0; TSH 2.723 12/27/2022: Magnesium 2.4 01/22/2023: BNP 345.5 03/21/2023: BUN 25; Creatinine, Ser 0.99; Hemoglobin 14.1; Platelets 225; Potassium 4.6; Sodium 137  Recent Lipid Panel    Component Value Date/Time   CHOL 141 12/14/2022 0629   CHOL 202 (H) 04/04/2021 1034   TRIG 91 12/14/2022 0629   HDL 36 (L) 12/14/2022 0629   HDL 72 04/04/2021 1034   CHOLHDL 3.9 12/14/2022 0629   VLDL 18 12/14/2022 0629   LDLCALC 87 12/14/2022 0629   LDLCALC 113 (H) 04/04/2021 1034   LDLDIRECT 118 (H) 10/04/2021 1058    PHYSICAL EXAM:    VS:  BP 137/82 (BP Location: Left Arm, Patient Position: Sitting, Cuff Size: Normal)   Pulse 71   Ht 5\' 3"  (1.6 m)   Wt 131 lb 12.8 oz (59.8 kg)   SpO2 98%   BMI 23.35 kg/m   BMI: Body mass index is 23.35 kg/m.  Physical Exam Vitals reviewed.  Constitutional:      Appearance: She is well-developed.  HENT:     Head: Normocephalic and atraumatic.  Eyes:     General:        Right eye: No discharge.        Left eye: No discharge.  Cardiovascular:     Rate and Rhythm: Normal rate and regular rhythm.     Heart sounds: Normal heart sounds, S1 normal and S2 normal. Heart sounds not distant. No midsystolic click and no opening snap. No murmur heard.    No friction rub.  Pulmonary:     Effort: Pulmonary effort is normal. No respiratory distress.     Breath sounds: Normal breath sounds. No decreased breath sounds, wheezing, rhonchi or rales.  Chest:     Chest wall: No tenderness.  Musculoskeletal:     Cervical back: Normal range of motion.     Right lower leg: No edema.     Left lower leg: No edema.  Skin:    General: Skin is warm and dry.     Nails: There is no clubbing.  Neurological:      Mental Status: She is alert and oriented to person, place, and time.  Psychiatric:        Speech: Speech normal.        Behavior: Behavior normal.        Thought Content: Thought content normal.        Judgment: Judgment normal.     Wt Readings from Last 3 Encounters:  06/22/23 131 lb 12.8 oz (59.8 kg)  05/11/23 133 lb 6.4 oz (60.5 kg)  04/26/23 135 lb 3.2 oz (61.3 kg)     ASSESSMENT & PLAN:   CAD involving the native coronary arteries without angina: She continues to do very well and is without symptoms of angina or cardiac decompensation.  Continue aggressive risk factor modification and secondary prevention with aspirin 81 mg daily and ticagrelor 90 mg twice daily for a minimum of 12 months dating back to date of PCI without interruption, 12/14/2022).  Ideally, would continue DAPT longer given length of stents along the LAD.  This can be readdressed in follow-up as she approaches her 57-month anniversary.  Has historically declined statin, ezetimibe, PCSK9 inhibitor, and bempedoic acid.  No indication for further ischemic testing at this time.  HFrEF secondary to ICM s/p ICD:  Euvolemic and well compensated with NYHA class II symptoms.  Relative hypotension has precluded escalation of GDMT and led to the discontinuation of ARB.  She remains on carvedilol 3.125 mg, Farxiga 10 mg and spironolactone.  Not requiring standing loop diuretic.  No device alarms or discharges.  CHF education.  HTN: Blood pressure is well-controlled in the office today.  Continue pharmacotherapy as outlined above.  HLD with statin intolerance: LDL 87 in 11/2022 with target LDL being less than 55.  Declines pharmacotherapy.  Dental extraction: Our preference is to avoid interruption of DAPT within the first 12 months of PCI, particularly given the length of LAD stent.    Disposition: F/u with Dr. Okey Dupre or an APP in 6 months, and EP as directed.    Medication Adjustments/Labs and Tests Ordered: Current medicines  are reviewed at length with the patient today.  Concerns regarding medicines are outlined above. Medication changes, Labs and Tests ordered today are summarized above and listed in the Patient Instructions accessible in Encounters.   Signed, Whitney Listen, PA-C 06/22/2023 2:32 PM     [facility] [place] [address] [place], [place] [address] [phone]

## 2023-06-22 NOTE — Patient Instructions (Signed)
Medication Instructions:  Your Physician recommend you continue on your current medication as directed.    *If you need a refill on your cardiac medications before your next appointment, please call your pharmacy*   Lab Work: None ordered at this time  If you have labs (blood work) drawn today and your tests are completely normal, you will receive your results only by: MyChart Message (if you have MyChart) OR A paper copy in the mail If you have any lab test that is abnormal or we need to change your treatment, we will call you to review the results.   Testing/Procedures: Disability Passenger transport manager paper work given   Follow-Up: At [facility], you and your health needs are our priority.  As part of our continuing mission to provide you with exceptional heart care, we have created designated Provider Care Teams.  These Care Teams include your primary Cardiologist (physician) and Advanced Practice Providers (APPs -  Physician Assistants and Nurse Practitioners) who all work together to provide you with the care you need, when you need it.  We recommend signing up for the patient portal called "MyChart".  Sign up information is provided on this After Visit Summary.  MyChart is used to connect with patients for Virtual Visits (Telemedicine).  Patients are able to view lab/test results, encounter notes, upcoming appointments, etc.  Non-urgent messages can be sent to your provider as well.   To learn more about what you can do with MyChart, go to [email or web address].    Your next appointment:   6 month(s)  Provider:   You may see Yvonne Kendall, MD or one of the following Advanced Practice Providers on your designated Care Team:   Eula Listen, [place]

## 2023-06-26 ENCOUNTER — Encounter: Payer: Self-pay | Admitting: Family Medicine

## 2023-06-27 NOTE — Telephone Encounter (Signed)
 She can certainly have this checked with cardiology if they are willing to though she is due for follow-up and needs a transfer of care visit scheduled.

## 2023-07-04 ENCOUNTER — Other Ambulatory Visit: Payer: Self-pay

## 2023-07-04 ENCOUNTER — Encounter (HOSPITAL_COMMUNITY): Payer: Self-pay

## 2023-07-04 ENCOUNTER — Other Ambulatory Visit (HOSPITAL_COMMUNITY): Payer: Self-pay

## 2023-07-11 ENCOUNTER — Other Ambulatory Visit: Payer: Self-pay | Admitting: Obstetrics and Gynecology

## 2023-07-11 ENCOUNTER — Ambulatory Visit: Payer: Medicare Other | Admitting: Cardiology

## 2023-07-12 ENCOUNTER — Ambulatory Visit: Payer: Medicare Other

## 2023-07-12 DIAGNOSIS — I255 Ischemic cardiomyopathy: Secondary | ICD-10-CM | POA: Diagnosis not present

## 2023-07-12 LAB — CUP PACEART REMOTE DEVICE CHECK
Battery Remaining Longevity: 180 mo
Battery Remaining Percentage: 100 %
Brady Statistic RV Percent Paced: 0 %
Date Time Interrogation Session: 20250313025100
HighPow Impedance: 89 Ohm
Implantable Lead Connection Status: 753985
Implantable Lead Implant Date: 20241211
Implantable Lead Location: 753860
Implantable Lead Model: 672
Implantable Lead Serial Number: 263510
Implantable Pulse Generator Implant Date: 20241211
Lead Channel Impedance Value: 485 Ohm
Lead Channel Pacing Threshold Amplitude: 0.6 V
Lead Channel Pacing Threshold Pulse Width: 0.4 ms
Lead Channel Setting Pacing Amplitude: 3.5 V
Lead Channel Setting Pacing Pulse Width: 0.4 ms
Lead Channel Setting Sensing Sensitivity: 0.5 mV
Pulse Gen Serial Number: 340837
Zone Setting Status: 755011

## 2023-07-15 ENCOUNTER — Encounter: Payer: Self-pay | Admitting: Cardiology

## 2023-07-17 ENCOUNTER — Telehealth: Payer: Self-pay

## 2023-07-17 ENCOUNTER — Ambulatory Visit: Payer: Medicare Other | Attending: Cardiology | Admitting: Cardiology

## 2023-07-17 ENCOUNTER — Encounter: Payer: Self-pay | Admitting: Cardiology

## 2023-07-17 VITALS — BP 132/76 | HR 83 | Ht 63.5 in | Wt 129.4 lb

## 2023-07-17 DIAGNOSIS — I255 Ischemic cardiomyopathy: Secondary | ICD-10-CM

## 2023-07-17 DIAGNOSIS — Z95810 Presence of automatic (implantable) cardiac defibrillator: Secondary | ICD-10-CM | POA: Diagnosis not present

## 2023-07-17 DIAGNOSIS — I5020 Unspecified systolic (congestive) heart failure: Secondary | ICD-10-CM | POA: Diagnosis not present

## 2023-07-17 LAB — CUP PACEART INCLINIC DEVICE CHECK
Date Time Interrogation Session: 20250318123916
HighPow Impedance: 93 Ohm
Implantable Lead Connection Status: 753985
Implantable Lead Implant Date: 20241211
Implantable Lead Location: 753860
Implantable Lead Model: 672
Implantable Lead Serial Number: 263510
Implantable Pulse Generator Implant Date: 20241211
Lead Channel Impedance Value: 500 Ohm
Lead Channel Pacing Threshold Amplitude: 0.6 V
Lead Channel Pacing Threshold Pulse Width: 0.4 ms
Lead Channel Sensing Intrinsic Amplitude: 10.7 mV
Lead Channel Setting Pacing Amplitude: 3.5 V
Lead Channel Setting Pacing Pulse Width: 0.4 ms
Lead Channel Setting Sensing Sensitivity: 0.5 mV
Pulse Gen Serial Number: 340837
Zone Setting Status: 755011

## 2023-07-17 NOTE — Progress Notes (Signed)
 Electrophysiology Clinic Note    Date:  07/17/2023  Patient ID:  Whitney, Knight 11/14/1950, MRN [id number] PCP:  Glori Luis, MD (Inactive)  Cardiologist:  Yvonne Kendall, MD HF cardiologist: Bensimhon Electrophysiologist: Lanier Prude, MD   Discussed the use of AI scribe software for clinical note transcription with the patient, who gave verbal consent to proceed.   Patient Profile    Chief Complaint: 89d routine PPM follow-up  History of Present Illness: Whitney Knight is a 73 y.o. female with PMH notable for CAD s/p PCI(11/2022 with late-presenting anterior STEMI), ICM s/p ICD, HFrEF, T2DM, HTN, HLD, hypothyroid; seen today for Lanier Prude, MD for routine electrophysiology followup.  She had ICD implant 04/2023 for primary prevention.   On follow-up today, she has no cardiac complaints. She remains active walking 3-4 miles per day. She has also started to lift weights again. Denies lower extremity edema, chest pain, chest pressure, palpitations.  Her only complaint is hand pain d/t Reynauds in the cold weather    Arrhythmia/Device History BSX single chamber ICD, imp 04/2023; dx HFrEF, primary prevention       ROS:  Please see the history of present illness. All other systems are reviewed and otherwise negative.    Physical Exam    VS:  BP 132/76   Pulse 83   Ht 5' 3.5" (1.613 m)   Wt 129 lb 6.4 oz (58.7 kg)   SpO2 100%   BMI 22.56 kg/m  BMI: Body mass index is 22.56 kg/m.  Wt Readings from Last 3 Encounters:  07/17/23 129 lb 6.4 oz (58.7 kg)  06/22/23 131 lb 12.8 oz (59.8 kg)  05/11/23 133 lb 6.4 oz (60.5 kg)     GEN- The patient is well appearing, alert and oriented x 3 today.   Lungs- Clear to ausculation bilaterally, normal work of breathing.  Heart- Regular rate and rhythm, no murmurs, rubs or gallops Extremities- No peripheral edema, warm, dry Skin-  device pocket well-healed, no tethering   Device interrogation done  today and reviewed by myself:  Battery 15 years Lead threshold, impedence, sensing stable  No episodes No changes made today   Studies Reviewed   Previous EP, cardiology notes.    EKG is not ordered. Personal review of EKG from  06/22/2023  shows:  SR at 71; incomplete LBBB        Cardiac MRI, 03/27/2023 1. Severely reduced LV systolic function.  LVEF 28%.  2. Transmural LGE/scar present in the LV mid-apical anterior, mid-apical anteroseptal, and apical walls.  3. Normal RV size and function.  4. No significant valvular abnormalities.  5. Findings consistent with ischemic cardiomyopathy, prior infarct involving LAD territory.  TTE, 03/19/2023  1. Left ventricular ejection fraction, by estimation, is 25 to 30%. The left ventricle has severely decreased function. The left ventricle demonstrates global hypokinesis. Left ventricular diastolic parameters are consistent with Grade II diastolic dysfunction (pseudonormalization).   2. Right ventricular systolic function is low normal.   3. The mitral valve is normal in structure. Mild mitral valve regurgitation.   4. The inferior vena cava is normal in size with greater than 50% respiratory variability, suggesting right atrial pressure of 3 mmHg.     Assessment and Plan     #) ICM s/p ICD S/p [employer] single chamber ICD Device functioning well, see paceart for details No concerning arrythmias  #) CAD Denies chest pain, chest pressure with great functional capacity Continue brillinta as  per gen cards She needs dental work and MD has requested she hold brilinta prior to Will msg pre-op team  #) HFrEF Warm and dry on exam Continue follow-up with HF team        Current medicines are reviewed at length with the patient today.   The patient does not have concerns regarding her medicines.  The following changes were made today:  none  Labs/ tests ordered today include:  No orders of the defined types were placed in  this encounter.    Disposition: Follow up with Dr. Lalla Brothers or EP APP in 12 months   Signed, Sherie Don, NP  07/17/23  12:37 PM  Electrophysiology [facility]

## 2023-07-17 NOTE — Telephone Encounter (Signed)
 Called requesting office to make them aware of recommendations office stated if we could "type recommendations in a letter or something"and fax it to them to make surgeon aware fax #[phone]

## 2023-07-17 NOTE — Patient Instructions (Signed)
Medication Instructions:  The current medical regimen is effective;  continue present plan and medications as directed. Please refer to the Current Medication list given to you today.   *If you need a refill on your cardiac medications before your next appointment, please call your pharmacy*   Follow-Up: At [facility], you and your health needs are our priority.  As part of our continuing mission to provide you with exceptional heart care, we have created designated Provider Care Teams.  These Care Teams include your primary Cardiologist (physician) and Advanced Practice Providers (APPs -  Physician Assistants and Nurse Practitioners) who all work together to provide you with the care you need, when you need it.  We recommend signing up for the patient portal called "MyChart".  Sign up information is provided on this After Visit Summary.  MyChart is used to connect with patients for Virtual Visits (Telemedicine).  Patients are able to view lab/test results, encounter notes, upcoming appointments, etc.  Non-urgent messages can be sent to your provider as well.   To learn more about what you can do with MyChart, go to [email or web address].    Your next appointment:   12 month(s)  Provider:   Steffanie Dunn, MD or Sherie Don, NP

## 2023-07-17 NOTE — Telephone Encounter (Signed)
 Preoperative team patient underwent PCI with drug-eluting stent placement 12/14/2022.  She received large stent to her LAD.  She will not be eligible for pausing her Brilinta until 12 months post stent placement.  Cessation of Brilinta could cause stent thrombus.  Please contact requesting office and let them know that her procedure will need to be postponed until after 12/14/2023.  Thank you.  Thomasene Ripple. Annakate Soulier NP-C     07/17/2023, 1:35 PM [facility] [address] Office [phone] Fax [phone]

## 2023-07-17 NOTE — Telephone Encounter (Signed)
   Pre-operative Risk Assessment    Patient Name: MAE DENUNZIO  DOB: Jun 25, 1950 MRN: [id number]   Date of last office visit: 07/17/2023 Date of next office visit: not scheduled yet.  Request for Surgical Clearance    Procedure:  Dental Extraction - Amount of Teeth to be Pulled:  2 (with 2 implants placed at the same time)  Date of Surgery:  Clearance 08/14/23                                 Surgeon:  Graylon Gunning, MD Surgeon's Group or Practice Name:  [facility] Phone number:  [phone] Fax number:  [phone]   Type of Clearance Requested:   - Pharmacy:  Hold Ticagrelor (Brilinta) 2 days prior   Type of Anesthesia:  Local    Additional requests/questions:    Guy Begin   07/17/2023, 1:07 PM

## 2023-07-20 ENCOUNTER — Other Ambulatory Visit: Payer: Self-pay

## 2023-07-20 ENCOUNTER — Other Ambulatory Visit (HOSPITAL_COMMUNITY): Payer: Self-pay

## 2023-07-20 ENCOUNTER — Other Ambulatory Visit: Payer: Self-pay | Admitting: Internal Medicine

## 2023-07-20 ENCOUNTER — Encounter: Payer: Self-pay | Admitting: Family Medicine

## 2023-07-20 DIAGNOSIS — E1165 Type 2 diabetes mellitus with hyperglycemia: Secondary | ICD-10-CM

## 2023-07-20 DIAGNOSIS — E119 Type 2 diabetes mellitus without complications: Secondary | ICD-10-CM

## 2023-07-20 MED ORDER — BLOOD GLUCOSE MONITORING SUPPL DEVI: 1.0000 | Freq: Three times a day (TID) | 0 refills | Status: DC

## 2023-07-20 MED ORDER — BLOOD GLUCOSE MONITOR KIT: PACK | 0 refills | Status: AC

## 2023-07-20 MED ORDER — CARVEDILOL 6.25 MG PO TABS: 3.1250 mg | ORAL_TABLET | Freq: Two times a day (BID) | ORAL | 3 refills | Status: DC

## 2023-07-20 MED ORDER — BLOOD GLUCOSE TEST VI STRP: 1.0000 | ORAL_STRIP | Freq: Three times a day (TID) | 1 refills | Status: AC

## 2023-07-20 MED ORDER — LANCETS MISC. MISC: 1.0000 | Freq: Three times a day (TID) | 0 refills | Status: DC

## 2023-07-20 MED ORDER — BLOOD GLUCOSE MONITOR KIT
PACK | 0 refills | Status: DC
Start: 2023-07-20 — End: 2023-07-20

## 2023-07-20 MED ORDER — LANCET DEVICE MISC: 1.0000 | Freq: Three times a day (TID) | 0 refills | Status: AC

## 2023-07-22 ENCOUNTER — Encounter: Payer: Self-pay | Admitting: Cardiology

## 2023-07-23 ENCOUNTER — Other Ambulatory Visit: Payer: Self-pay | Admitting: Internal Medicine

## 2023-07-24 ENCOUNTER — Other Ambulatory Visit (HOSPITAL_COMMUNITY): Payer: Self-pay

## 2023-07-24 MED ORDER — LANCETS MISC. MISC
1.0000 | Freq: Three times a day (TID) | 0 refills | Status: AC
  Filled 2023-07-24: qty 100, 30d supply, fill #0

## 2023-07-24 NOTE — Telephone Encounter (Signed)
 Dr. Okey Dupre,   Patient underwent PCI with DES 2.5 x 30 mm to proximal LAD on 12/14/2022. She is pending extraction of 2 teeth with 2 implants placed at the same time on 4/15. Per office protocol and given length of stent/<1 year from intervention, will you please provide recommendations on holding Brilinta prior to procedure?  Please route your response to P CV DIV Preop. I will communicate with requesting office once you have given recommendations.   Thank you!  Carlos Levering, NP

## 2023-07-30 NOTE — Telephone Encounter (Signed)
 In general, dental surgery is low risk from a cardiovascular standpoint.  If Whitney Knight does not have any new symptoms, she can proceed without further testing or intervention.  In regard to her antiplatelet therapy, I recommend continuation of aspirin and ticagrelor during the periprocedural period unless there is excessively high risk for significant bleeding leading to morbidity or mortality.  If ticagrelor needs to be stopped for the procedure based on her oral surgeons expertise, I would favor delaying the dental procedure until late 11/2023, so that 12 months of DAPT can be completed from the time of PCI on 12/14/2022.  If it must be done sooner and ticagrelor held for the procedure, ticagrelor could be stopped for 5 days before the procedure and restarted as soon as it is safe to do so.  However, the risk for major adverse cardiac events increases with premature interruption of dual antiplatelet therapy.  Regardless, aspirin 81 mg daily will need to be continued throughout the periprocedural period.  Whitney Kendall, MD [facility]

## 2023-07-30 NOTE — Telephone Encounter (Signed)
   Patient Name: Whitney Knight  DOB: 1950-06-12 MRN: [id number]  Primary Cardiologist: Yvonne Kendall, MD  Chart reviewed as part of pre-operative protocol coverage. Pre-op clearance already addressed by colleagues in earlier phone notes. To summarize recommendations:   In general, dental surgery is low risk from a cardiovascular standpoint.  If [name] does not have any new symptoms, she can proceed without further testing or intervention.    In regard to her antiplatelet therapy, I recommend continuation of aspirin and ticagrelor during the periprocedural period unless there is excessively high risk for significant bleeding leading to morbidity or mortality.  If ticagrelor needs to be stopped for the procedure based on her oral surgeons expertise, I would favor delaying the dental procedure until late 11/2023, so that 12 months of DAPT can be completed from the time of PCI on 12/14/2022.    If it must be done sooner and ticagrelor held for the procedure, ticagrelor could be stopped for 5 days before the procedure and restarted as soon as it is safe to do so.  However, the risk for major adverse cardiac events increases with premature interruption of dual antiplatelet therapy.    Regardless, aspirin 81 mg daily will need to be continued throughout the periprocedural period.   Yvonne Kendall, MD [facility]      Will route this bundled recommendation to requesting provider via Epic fax function and remove from pre-op pool. Please call with questions.  Sharlene Dory, PA-C 07/30/2023, 7:47 AM

## 2023-08-09 ENCOUNTER — Other Ambulatory Visit (HOSPITAL_COMMUNITY): Payer: Self-pay

## 2023-08-23 NOTE — Progress Notes (Signed)
 Remote ICD transmission.

## 2023-08-29 ENCOUNTER — Other Ambulatory Visit (HOSPITAL_COMMUNITY): Payer: Self-pay

## 2023-09-11 ENCOUNTER — Other Ambulatory Visit: Payer: Self-pay | Admitting: Internal Medicine

## 2023-09-23 ENCOUNTER — Other Ambulatory Visit: Payer: Self-pay

## 2023-09-23 ENCOUNTER — Encounter (HOSPITAL_COMMUNITY): Payer: Self-pay

## 2023-09-25 ENCOUNTER — Other Ambulatory Visit (HOSPITAL_COMMUNITY): Payer: Self-pay

## 2023-09-25 ENCOUNTER — Other Ambulatory Visit: Payer: Self-pay

## 2023-09-29 ENCOUNTER — Other Ambulatory Visit: Payer: Self-pay | Admitting: Internal Medicine

## 2023-09-29 DIAGNOSIS — E1165 Type 2 diabetes mellitus with hyperglycemia: Secondary | ICD-10-CM

## 2023-10-03 ENCOUNTER — Other Ambulatory Visit (HOSPITAL_COMMUNITY): Payer: Self-pay

## 2023-10-03 ENCOUNTER — Ambulatory Visit: Admitting: *Deleted

## 2023-10-03 VITALS — Ht 64.0 in | Wt 124.2 lb

## 2023-10-03 DIAGNOSIS — Z0000 Encounter for general adult medical examination without abnormal findings: Secondary | ICD-10-CM | POA: Diagnosis not present

## 2023-10-03 MED ORDER — MOUNJARO 12.5 MG/0.5ML ~~LOC~~ SOAJ
12.5000 mg | SUBCUTANEOUS | 2 refills | Status: DC
  Filled 2023-10-03: qty 6, 84d supply, fill #0

## 2023-10-03 NOTE — Progress Notes (Signed)
 Subjective:   Whitney Knight is a 73 y.o. who presents for a Medicare Wellness preventive visit.  As a reminder, Annual Wellness Visits don't include a physical exam, and some assessments may be limited, especially if this visit is performed virtually. We may recommend an in-person follow-up visit with your provider if needed.  Visit Complete: Virtual I connected with  Whitney Knight on 10/03/23 by a audio enabled telemedicine application and verified that I am speaking with the correct person using two identifiers.  Patient Location: Home  Provider Location: Home Office  I discussed the limitations of evaluation and management by telemedicine. The patient expressed understanding and agreed to proceed.  Vital Signs: Because this visit was a virtual/telehealth visit, some criteria may be missing or patient reported. Any vitals not documented were not able to be obtained and vitals that have been documented are patient reported.  VideoDeclined- This patient declined Librarian, academic. Therefore the visit was completed with audio only.  Persons Participating in Visit: Patient.  AWV Questionnaire: Yes: Patient Medicare AWV questionnaire was completed by the patient on 09/29/23 and 10/02/23; I have confirmed that all information answered by patient is correct and no changes since this date.  Cardiac Risk Factors include: advanced age (>18men, >3 women);diabetes mellitus;hypertension;dyslipidemia;Other (see comment), Risk factor comments: CAD     Objective:     Today's Vitals   10/03/23 1553  Weight: 124 lb 4 oz (56.4 kg)  Height: 5\' 4"  (1.626 m)   Body mass index is 21.33 kg/m.     10/03/2023    4:12 PM 04/11/2023    8:15 PM 04/11/2023    2:13 PM 12/27/2022    2:32 PM 12/12/2022    5:43 PM 10/10/2022   11:09 AM 09/21/2021    2:11 PM  Advanced Directives  Does Patient Have a Medical Advance Directive? Yes  Yes Yes No No Yes  Type of Sports coach of [place];Living will Living will;Healthcare Power of Attorney Living will;Healthcare Power of Attorney Living will   Healthcare Power of [place];Living will  Does patient want to make changes to medical advance directive?       No - Patient declined  Copy of Healthcare Power of Attorney in Chart? No - copy requested No - copy requested No - copy requested    No - copy requested  Would patient like information on creating a medical advance directive?     No - Patient declined No - Patient declined     Current Medications (verified) Outpatient Encounter Medications as of 10/03/2023  Medication Sig   aspirin  EC 81 MG tablet Take 1 tablet (81 mg total) by mouth daily. Swallow whole.   blood glucose meter kit and supplies KIT One touch ultra mini. Use 2-4 times per day.   Blood Glucose Monitoring Suppl DEVI 1 each by Does not apply route in the morning, at noon, and at bedtime. May substitute to any manufacturer covered by patient's insurance.   carvedilol  (COREG ) 6.25 MG tablet Take 0.5 tablets (3.125 mg total) by mouth 2 (two) times daily with a meal.   Coenzyme Q10 (COQ10 PO) Take 250 mg by mouth every evening.   dapagliflozin  propanediol (FARXIGA ) 10 MG TABS tablet Take 1 tablet (10 mg total) by mouth daily.   fluticasone  (FLONASE ) 50 MCG/ACT nasal spray USE TWO SPRAYS IN EACH NOSTRIL DAILY   Lancets (ONETOUCH DELICA PLUS LANCET33G) MISC USE ONCE DAILY AS DIRECTED   levothyroxine  (SYNTHROID ) 75 MCG  tablet TAKE 1 TABLET BY MOUTH DAILY   NON FORMULARY Camu supreme  Two times daily   ONETOUCH ULTRA test strip USE TO CHECK BLOOD SUGAR UP TO THREE TIMES DAILY   OVER THE COUNTER MEDICATION Take 1 capsule by mouth at bedtime. Oxy-Powder digestive support   saccharomyces boulardii (FLORASTOR) 250 MG capsule Take 1 capsule (250 mg total) by mouth daily.   spironolactone  (ALDACTONE ) 25 MG tablet Take 0.5 tablets (12.5 mg total) by mouth daily.   ticagrelor  (BRILINTA ) 90 MG  TABS tablet Take 1 tablet (90 mg total) by mouth 2 (two) times daily.   tirzepatide  (MOUNJARO ) 12.5 MG/0.5ML Pen Inject 12.5 mg into the skin once a week.   levOCARNitine (L-CARNITINE PO) Take 1 tablet by mouth in the morning and at bedtime. (Patient not taking: Reported on 10/03/2023)   [DISCONTINUED] cetirizine (ZYRTEC) 10 MG tablet Take 10 mg by mouth daily. (Patient not taking: Reported on 10/03/2023)   No facility-administered encounter medications on file as of 10/03/2023.    Allergies (verified) Penicillins, Prednisone , Clarithromycin, and Jardiance  [empagliflozin ]   History: Past Medical History:  Diagnosis Date   Allergy    Anemia    Arthritis    KNEES   Cataract    Chronic sinusitis    Cough    Diabetes mellitus    Diverticulosis    GERD (gastroesophageal reflux disease)    Headache(784.0)    [facility] in past   Hypertension    Osteoporosis    Thyroid  disease    Past Surgical History:  Procedure Laterality Date   bone graft     for dental surgery   CARPAL TUNNEL RELEASE Right 02/27/2013   [facility]   COLONOSCOPY  2014   CORONARY STENT INTERVENTION N/A 12/14/2022   Procedure: CORONARY STENT INTERVENTION;  Surgeon: Arleen Lacer, MD;  Location: [facility] INVASIVE CV LAB;  Service: Cardiovascular;  Laterality: N/A;   dental implants     DENTAL SURGERY     DENTAL SURGERY     dental implant   ICD IMPLANT N/A 04/11/2023   Procedure: ICD IMPLANT;  Surgeon: Boyce Byes, MD;  Location: [facility] INVASIVE CV LAB;  Service: Cardiovascular;  Laterality: N/A;   MOUTH SURGERY  2025   NASAL SINUS SURGERY  06/2008   Dr. Silvestre Drum   RIGHT/LEFT HEART CATH AND CORONARY ANGIOGRAPHY N/A 12/14/2022   Procedure: RIGHT/LEFT HEART CATH AND CORONARY ANGIOGRAPHY;  Surgeon: Arleen Lacer, MD;  Location: [facility] INVASIVE CV LAB;  Service: Cardiovascular;  Laterality: N/A;   TOOTH EXTRACTION  2025   TRIGGER FINGER RELEASE Right 02/27/2013   [facility]   UPPER GASTROINTESTINAL ENDOSCOPY      VAGINAL DELIVERY     2   Family History  Problem Relation Age of Onset   COPD Father    Hypertension Father    Heart disease Father    Heart disease Mother    Diabetes Maternal Grandmother    Colon cancer Neg Hx    Esophageal cancer Neg Hx    Pancreatic cancer Neg Hx    Stomach cancer Neg Hx    Liver disease Neg Hx    Social History   Socioeconomic History   Marital status: Married    Spouse name: Not on file   Number of children: 2   Years of education: Not on file   Highest education level: Associate degree: occupational, Scientist, product/process development, or vocational program  Occupational History   Occupation: [employer]    Employer: [employer]  Tobacco Use  Smoking status: Never   Smokeless tobacco: Never  Vaping Use   Vaping status: Never Used  Substance and Sexual Activity   Alcohol use: Yes    Comment: occ   Drug use: No   Sexual activity: Not on file  Other Topics Concern   Not on file  Social History Narrative   Lives with husband. No pets, 2 children.      Work - [employer]      Diet - regular   Exercise - none presently   Social Drivers of Corporate investment banker Strain: Low Risk  (10/02/2023)   Overall Financial Resource Strain (CARDIA)    Difficulty of Paying Living Expenses: Not hard at all  Food Insecurity: No Food Insecurity (10/02/2023)   Hunger Vital Sign    Worried About Running Out of Food in the Last Year: Never true    Ran Out of Food in the Last Year: Never true  Transportation Needs: No Transportation Needs (10/02/2023)   PRAPARE - Administrator, Civil Service (Medical): No    Lack of Transportation (Non-Medical): No  Physical Activity: Sufficiently Active (10/02/2023)   Exercise Vital Sign    Days of Exercise per Week: 7 days    Minutes of Exercise per Session: 60 min  Stress: No Stress Concern Present (10/02/2023)   [employer] of Occupational Health - Occupational Stress Questionnaire    Feeling of Stress : Not at all  Social  Connections: Moderately Integrated (10/02/2023)   Social Connection and Isolation Panel [NHANES]    Frequency of Communication with Friends and Family: More than three times a week    Frequency of Social Gatherings with Friends and Family: More than three times a week    Attends Religious Services: Never    Database administrator or Organizations: Yes    Attends Engineer, structural: More than 4 times per year    Marital Status: Married    Tobacco Counseling Counseling given: Not Answered    Clinical Intake:  Pre-visit preparation completed: Yes  Pain : No/denies pain     BMI - recorded: 21.33 Nutritional Status: BMI of 19-24  Normal Nutritional Risks: None Diabetes: Yes  Lab Results  Component Value Date   HGBA1C 6.6 (H) 01/22/2023   HGBA1C 7.0 (H) 08/21/2022   HGBA1C 7.4 (H) 05/18/2022     How often do you need to have someone help you when you read instructions, pamphlets, or other written materials from your doctor or pharmacy?: 1 - Never  Interpreter Needed?: No  Information entered by :: R. Lashun Ramseyer LPN   Activities of Daily Living     09/29/2023    9:33 AM 04/11/2023    8:15 PM  In your present state of health, do you have any difficulty performing the following activities:  Hearing? 1 0  Comment wears aids   Vision? 0 0  Comment glasses   Difficulty concentrating or making decisions? 0 0  Walking or climbing stairs? 0   Dressing or bathing? 0   Doing errands, shopping? 0   Preparing Food and eating ? N   Using the Toilet? N   In the past six months, have you accidently leaked urine? N   Do you have problems with loss of bowel control? N   Managing your Medications? N   Managing your Finances? N   Housekeeping or managing your Housekeeping? N     Patient Care Team: Sammy Crisp, MD as PCP - Cardiology (  Cardiology) Boyce Byes, MD as PCP - Electrophysiology (Cardiology) [facility], [facility] (Optometry)  I have updated your Care  Teams any recent Medical Services you may have received from other providers in the past year.     Assessment:    This is a routine wellness examination for [place].  Hearing/Vision screen Hearing Screening - Comments:: Wears aids Vision Screening - Comments:: glasses   Goals Addressed             This Visit's Progress    Patient Stated       Wants to continue to walk and exercise       Depression Screen     10/03/2023    4:06 PM 01/04/2023    4:50 PM 01/03/2023    9:04 AM 10/10/2022   11:08 AM 08/21/2022   10:58 AM 01/11/2022    2:48 PM 09/21/2021    2:13 PM  PHQ 2/9 Scores  PHQ - 2 Score 0 0 0 0 0 0 0  PHQ- 9 Score 0 0 0 0 0      Fall Risk     09/29/2023    9:33 AM 01/03/2023    9:04 AM 12/27/2022    2:30 PM 10/10/2022   11:10 AM 08/21/2022   10:57 AM  Fall Risk   Falls in the past year? 0 0 0 0 0  Number falls in past yr: 0 0 0 0 0  Injury with Fall? 0 0 0 0 0  Risk for fall due to : No Fall Risks No Fall Risks Medication side effect No Fall Risks No Fall Risks  Follow up Falls evaluation completed;Falls prevention discussed Falls evaluation completed Falls evaluation completed;Education provided;Falls prevention discussed Falls prevention discussed;Falls evaluation completed Falls evaluation completed    MEDICARE RISK AT HOME:  Medicare Risk at Home Any stairs in or around the home?: (Patient-Rptd) Yes If so, are there any without handrails?: (Patient-Rptd) No Home free of loose throw rugs in walkways, pet beds, electrical cords, etc?: (Patient-Rptd) Yes Adequate lighting in your home to reduce risk of falls?: (Patient-Rptd) Yes Life alert?: (Patient-Rptd) No Use of a cane, walker or w/c?: (Patient-Rptd) No Grab bars in the bathroom?: (Patient-Rptd) No Shower chair or bench in shower?: (Patient-Rptd) No Elevated toilet seat or a handicapped toilet?: (Patient-Rptd) No  TIMED UP AND GO:  Was the test performed?  No  Cognitive Function: 6CIT completed         10/03/2023    4:13 PM 10/10/2022   11:17 AM 07/28/2019   11:02 AM  6CIT Screen  What Year? 0 points 0 points 0 points  What month? 0 points 0 points 0 points  What time? 0 points 0 points 0 points  Count back from 20 0 points 0 points   Months in reverse 0 points 0 points   Repeat phrase 2 points 0 points   Total Score 2 points 0 points     Immunizations Immunization History  Administered Date(s) Administered   Fluad Quad(high Dose 65+) 03/13/2019   Influenza Split 03/01/2014   Influenza,inj,Quad PF,6+ Mos 01/31/2013, 01/25/2015, 01/25/2018   Influenza-Unspecified 04/22/2012   Pneumococcal Polysaccharide-23 10/31/2012   Td 01/03/2007    Screening Tests Health Maintenance  Topic Date Due   DTaP/Tdap/Td (2 - Tdap) 01/02/2017   FOOT EXAM  10/05/2022   Colonoscopy  01/11/2023   Diabetic kidney evaluation - Urine ACR  05/19/2023   HEMOGLOBIN A1C  07/22/2023   MAMMOGRAM  08/09/2023   Medicare Annual  Wellness (AWV)  10/10/2023   INFLUENZA VACCINE  11/30/2023   OPHTHALMOLOGY EXAM  02/02/2024   Diabetic kidney evaluation - eGFR measurement  03/20/2024   DEXA SCAN  Completed   Hepatitis C Screening  Completed   HPV VACCINES  Aged Out   Meningococcal B Vaccine  Aged Out   Pneumonia Vaccine 4+ Years old  Discontinued   COVID-19 Vaccine  Discontinued   Zoster Vaccines- Shingrix  Discontinued    Health Maintenance  Health Maintenance Due  Topic Date Due   DTaP/Tdap/Td (2 - Tdap) 01/02/2017   FOOT EXAM  10/05/2022   Colonoscopy  01/11/2023   Diabetic kidney evaluation - Urine ACR  05/19/2023   HEMOGLOBIN A1C  07/22/2023   MAMMOGRAM  08/09/2023   Medicare Annual Wellness (AWV)  10/10/2023   Health Maintenance Items Addressed: Patient declines vaccines, mammogram and colonoscopy. Patient stated that she has her A1C checked with her cardiologist and is seeing him soon.  Additional Screening:  Vision Screening: Recommended annual ophthalmology exams for early detection of  glaucoma and other disorders of the eye.Up to date [facility] Would you like a referral to an eye doctor? No    Dental Screening: Recommended annual dental exams for proper oral hygiene  Community Resource Referral / Chronic Care Management: CRR required this visit?  No   CCM required this visit?  No   Plan:    I have personally reviewed and noted the following in the patient's chart:   Medical and social history Use of alcohol, tobacco or illicit drugs  Current medications and supplements including opioid prescriptions. Patient is not currently taking opioid prescriptions. Functional ability and status Nutritional status Physical activity Advanced directives List of other physicians Hospitalizations, surgeries, and ER visits in previous 12 months Vitals Screenings to include cognitive, depression, and falls Referrals and appointments  In addition, I have reviewed and discussed with patient certain preventive protocols, quality metrics, and best practice recommendations. A written personalized care plan for preventive services as well as general preventive health recommendations were provided to patient.   Felicitas Horse, LPN   0/07/5407   After Visit Summary: (MyChart) Due to this being a telephonic visit, the after visit summary with patients personalized plan was offered to patient via MyChart   Notes: Nothing significant to report at this time.

## 2023-10-03 NOTE — Patient Instructions (Signed)
 [name] , Thank you for taking time out of your busy schedule to complete your Annual Wellness Visit with me. I enjoyed our conversation and look forward to speaking with you again next year. I, as well as your care team,  appreciate your ongoing commitment to your health goals. Please review the following plan we discussed and let me know if I can assist you in the future. Your Game plan/ To Do List    Referrals: If you haven't heard from the office you've been referred to, please reach out to them at the phone provided.  Consider updating your vaccines, mammogram and colonoscopy. Follow up Visits: Next Medicare AWV with our clinical staff: 10/07/24 @ 3:00   Have you seen your provider in the last 6 months (3 months if uncontrolled diabetes)? No Next Office Visit with your provider: 10/17/23  Clinician Recommendations:  Aim for 30 minutes of exercise or brisk walking, 6-8 glasses of water, and 5 servings of fruits and vegetables each day.       This is a list of the screening recommended for you and due dates:  Health Maintenance  Topic Date Due   DTaP/Tdap/Td vaccine (2 - Tdap) 01/02/2017   Complete foot exam   10/05/2022   Colon Cancer Screening  01/11/2023   Yearly kidney health urinalysis for diabetes  05/19/2023   Hemoglobin A1C  07/22/2023   Mammogram  08/09/2023   Flu Shot  11/30/2023   Eye exam for diabetics  02/02/2024   Yearly kidney function blood test for diabetes  03/20/2024   Medicare Annual Wellness Visit  10/02/2024   DEXA scan (bone density measurement)  Completed   Hepatitis C Screening  Completed   HPV Vaccine  Aged Out   Meningitis B Vaccine  Aged Out   Pneumonia Vaccine  Discontinued   COVID-19 Vaccine  Discontinued   Zoster (Shingles) Vaccine  Discontinued    Advanced directives: (Copy Requested) Please bring a copy of your health care power of attorney and living will to the office to be added to your chart at your convenience. You can mail to [facility] [address] [place], [place] [address] or email to [email or web address] Advance Care Planning is important because it:  [x]  Makes sure you receive the medical care that is consistent with your values, goals, and preferences  [x]  It provides guidance to your family and loved ones and reduces their decisional burden about whether or not they are making the right decisions based on your wishes.

## 2023-10-04 ENCOUNTER — Other Ambulatory Visit (HOSPITAL_COMMUNITY): Payer: Self-pay

## 2023-10-11 ENCOUNTER — Ambulatory Visit (INDEPENDENT_AMBULATORY_CARE_PROVIDER_SITE_OTHER): Payer: Medicare Other

## 2023-10-11 DIAGNOSIS — I255 Ischemic cardiomyopathy: Secondary | ICD-10-CM | POA: Diagnosis not present

## 2023-10-15 ENCOUNTER — Other Ambulatory Visit: Payer: Self-pay | Admitting: Internal Medicine

## 2023-10-15 DIAGNOSIS — E119 Type 2 diabetes mellitus without complications: Secondary | ICD-10-CM

## 2023-10-15 LAB — CUP PACEART REMOTE DEVICE CHECK
Battery Remaining Longevity: 180 mo
Battery Remaining Percentage: 100 %
Brady Statistic RV Percent Paced: 0 %
Date Time Interrogation Session: 20250616021200
HighPow Impedance: 91 Ohm
Implantable Lead Connection Status: 753985
Implantable Lead Implant Date: 20241211
Implantable Lead Location: 753860
Implantable Lead Model: 672
Implantable Lead Serial Number: 263510
Implantable Pulse Generator Implant Date: 20241211
Lead Channel Impedance Value: 512 Ohm
Lead Channel Pacing Threshold Amplitude: 0.6 V
Lead Channel Pacing Threshold Pulse Width: 0.4 ms
Lead Channel Setting Pacing Amplitude: 3.5 V
Lead Channel Setting Pacing Pulse Width: 0.4 ms
Lead Channel Setting Sensing Sensitivity: 0.5 mV
Pulse Gen Serial Number: 340837
Zone Setting Status: 755011

## 2023-10-16 ENCOUNTER — Ambulatory Visit: Payer: Self-pay | Admitting: Cardiology

## 2023-10-17 ENCOUNTER — Other Ambulatory Visit: Payer: Self-pay

## 2023-10-17 ENCOUNTER — Other Ambulatory Visit (HOSPITAL_COMMUNITY): Payer: Self-pay

## 2023-10-17 ENCOUNTER — Ambulatory Visit: Payer: Self-pay

## 2023-10-17 ENCOUNTER — Ambulatory Visit

## 2023-10-17 VITALS — BP 98/82 | HR 74 | Temp 97.6°F | Ht 63.5 in | Wt 124.6 lb

## 2023-10-17 DIAGNOSIS — I10 Essential (primary) hypertension: Secondary | ICD-10-CM

## 2023-10-17 DIAGNOSIS — E119 Type 2 diabetes mellitus without complications: Secondary | ICD-10-CM

## 2023-10-17 DIAGNOSIS — Z7984 Long term (current) use of oral hypoglycemic drugs: Secondary | ICD-10-CM

## 2023-10-17 DIAGNOSIS — E039 Hypothyroidism, unspecified: Secondary | ICD-10-CM | POA: Diagnosis not present

## 2023-10-17 DIAGNOSIS — Z7985 Long-term (current) use of injectable non-insulin antidiabetic drugs: Secondary | ICD-10-CM

## 2023-10-17 DIAGNOSIS — E782 Mixed hyperlipidemia: Secondary | ICD-10-CM | POA: Diagnosis not present

## 2023-10-17 DIAGNOSIS — J309 Allergic rhinitis, unspecified: Secondary | ICD-10-CM | POA: Diagnosis not present

## 2023-10-17 DIAGNOSIS — E1165 Type 2 diabetes mellitus with hyperglycemia: Secondary | ICD-10-CM

## 2023-10-17 DIAGNOSIS — E118 Type 2 diabetes mellitus with unspecified complications: Secondary | ICD-10-CM | POA: Diagnosis not present

## 2023-10-17 DIAGNOSIS — T148XXA Other injury of unspecified body region, initial encounter: Secondary | ICD-10-CM | POA: Diagnosis not present

## 2023-10-17 DIAGNOSIS — Z5320 Procedure and treatment not carried out because of patient's decision for unspecified reasons: Secondary | ICD-10-CM | POA: Insufficient documentation

## 2023-10-17 DIAGNOSIS — J189 Pneumonia, unspecified organism: Secondary | ICD-10-CM

## 2023-10-17 DIAGNOSIS — R0602 Shortness of breath: Secondary | ICD-10-CM

## 2023-10-17 LAB — T4, FREE: Free T4: 2.35 ng/dL — ABNORMAL HIGH (ref 0.60–1.60)

## 2023-10-17 LAB — TSH: TSH: 1.98 u[IU]/mL (ref 0.35–5.50)

## 2023-10-17 LAB — HEMOGLOBIN A1C: Hgb A1c MFr Bld: 6.5 % (ref 4.6–6.5)

## 2023-10-17 MED ORDER — FLUTICASONE PROPIONATE 50 MCG/ACT NA SUSP: 2.0000 | Freq: Every day | NASAL | 0 refills | Status: DC

## 2023-10-17 MED ORDER — MOUNJARO 12.5 MG/0.5ML ~~LOC~~ SOAJ: 12.5000 mg | SUBCUTANEOUS | 2 refills | Status: DC

## 2023-10-17 MED ORDER — LEVOTHYROXINE SODIUM 50 MCG PO TABS: 50.0000 ug | ORAL_TABLET | Freq: Every day | ORAL | 3 refills | Status: AC

## 2023-10-17 MED ORDER — SACCHAROMYCES BOULARDII 250 MG PO CAPS
250.0000 mg | ORAL_CAPSULE | Freq: Every day | ORAL | 3 refills | Status: AC
  Filled 2023-10-17 – 2023-11-05 (×4): qty 100, 100d supply, fill #0
  Filled 2024-05-14: qty 50, 50d supply, fill #0
  Filled 2024-05-15: qty 40, 40d supply, fill #0

## 2023-10-17 NOTE — Assessment & Plan Note (Addendum)
 Chronic issue.  A1c improved 01/22/23 6.6%, repeat A1c today.  Continue Mounjaro  to 12.5 mg weekly and Farxiga  10 mg daily. Continue.  Given DAPT therapy, bruising, no hypoglycemia symptoms I don't recommend patient checking home blood glucose daily. She should check blood glucose if she feels unwell or has hypoglycemia symptoms which was discussed in detail during her appointment. We can monitor A1c every 3 monthly.  Recommend checking urine microalbumin during next appointment. Was normal in 05/2022.

## 2023-10-17 NOTE — Progress Notes (Signed)
 Please let the patient know her HbA1c is stable.  Her TSH is normal but her active thyroid  hormone or T4 is elevated. I recommend reducing dose of Synthroid  from 75 mcg daily to 50 mcg daily. Repeat TSH and T4 in about 8 weeks to make sure thyroid  hormone level is normalized. Medication and lab ordered.   Thank you,  Jacklin Mascot, MD

## 2023-10-17 NOTE — Assessment & Plan Note (Signed)
Patient preference

## 2023-10-17 NOTE — Progress Notes (Signed)
 Established Patient Office Visit TOC from Dr. Lovetta Rucks    Subjective  Patient ID: Whitney Knight, female    DOB: Apr 20, 1951  Age: 73 y.o. MRN: [id number]  Chief Complaint  Patient presents with   Establish Care   Transitions Of Care    She  has a past medical history of Acute pulmonary edema (HCC) (12/14/2022), Acute systolic heart failure (HCC) (25/95/6387), Alkaline phosphatase elevation (07/04/2021), Allergy, Anemia, Arthritis, Cataract, Chronic sinusitis, Cough, Diabetes mellitus, Diverticulosis, GERD (gastroesophageal reflux disease), Headache(784.0), Hypertension, Hyponatremia (12/12/2022), Osteoporosis, Overactive bladder (05/10/2022), Shortness of breath (12/13/2022), and Thyroid  disease.  HPI 1) Right ear popping sensation for couple of days. The ear popping sensation started after recent travel from [place]. Feeling better today.   2) Goes to functional doctor in [place] ( [place], [place], [place]), is taking supplements prescribed through him.    3) CAD S/P PCI 11/2022 ICM s/p ICD  Hypertension Hyperlipidemia HFrEF  - On dual antiplatelet therapy: Aspirin  81 mg and Brilinta  90 mg twice a day.   - Closely follows up with cardiology.  - Not on statin due to patient preference. She take Co-q 10 120 mg daily. - On Carvedilol  6.25 mg twice a day, Spironolactone  25 mg daily.   4) Type II DM:  On Farxiga  10 mg. On Mounjaro  12.5 mg once a week.  She is doing home blood glucose testing twice a day. Fasting blood glucose in 80s.   5) Takes Florastor 250 mg daily.    6) Hypothyroidism:  Takes Synthroid  75 mcg daily. Would like to come off this. Last TSH 12/12/22 was normal.   7) Gets seasonal seasonal allergy:  On Nasal Flonase  daily.   8) Due for colonoscopy, qualifies for mammogram: Patient preference to not do it. She also declines updating immunization.   ROS As per HPI    Objective:     BP 98/82   Pulse 74   Temp 97.6 F (36.4 C) (Oral)   Ht 5' 3.5  (1.613 m)   Wt 124 lb 9.6 oz (56.5 kg)   SpO2 99%   BMI 21.73 kg/m      10/03/2023    4:06 PM 01/04/2023    4:50 PM 01/03/2023    9:04 AM  Depression screen PHQ 2/9  Decreased Interest 0 0 0  Down, Depressed, Hopeless 0 0 0  PHQ - 2 Score 0 0 0  Altered sleeping 0 0 0  Tired, decreased energy 0 0 0  Change in appetite 0 0 0  Feeling bad or failure about yourself  0 0 0  Trouble concentrating 0 0 0  Moving slowly or fidgety/restless 0 0   Suicidal thoughts 0 0 0  PHQ-9 Score 0 0 0  Difficult doing work/chores Not difficult at all Not difficult at all Not difficult at all      01/03/2023    9:04 AM 08/21/2022   10:58 AM  GAD 7 : Generalized Anxiety Score  Nervous, Anxious, on Edge 0 0  Control/stop worrying 0 0  Worry too much - different things 0 0  Trouble relaxing 0 0  Restless 0 0  Easily annoyed or irritable 0 0  Afraid - awful might happen 0 0  Total GAD 7 Score 0 0  Anxiety Difficulty Not difficult at all Not difficult at all      10/03/2023    4:06 PM 01/04/2023    4:50 PM 01/03/2023    9:04 AM  Depression screen PHQ  2/9  Decreased Interest 0 0 0  Down, Depressed, Hopeless 0 0 0  PHQ - 2 Score 0 0 0  Altered sleeping 0 0 0  Tired, decreased energy 0 0 0  Change in appetite 0 0 0  Feeling bad or failure about yourself  0 0 0  Trouble concentrating 0 0 0  Moving slowly or fidgety/restless 0 0   Suicidal thoughts 0 0 0  PHQ-9 Score 0 0 0  Difficult doing work/chores Not difficult at all Not difficult at all Not difficult at all      01/03/2023    9:04 AM 08/21/2022   10:58 AM  GAD 7 : Generalized Anxiety Score  Nervous, Anxious, on Edge 0 0  Control/stop worrying 0 0  Worry too much - different things 0 0  Trouble relaxing 0 0  Restless 0 0  Easily annoyed or irritable 0 0  Afraid - awful might happen 0 0  Total GAD 7 Score 0 0  Anxiety Difficulty Not difficult at all Not difficult at all   SDOH Screenings   Food Insecurity: No Food Insecurity (10/16/2023)   Housing: Unknown (10/16/2023)  Transportation Needs: No Transportation Needs (10/16/2023)  Utilities: Not At Risk (10/03/2023)  Alcohol Screen: Low Risk  (10/16/2023)  Depression (PHQ2-9): Low Risk  (10/03/2023)  Financial Resource Strain: Low Risk  (10/16/2023)  Physical Activity: Sufficiently Active (10/16/2023)  Social Connections: Moderately Integrated (10/16/2023)  Stress: No Stress Concern Present (10/16/2023)  Tobacco Use: Low Risk  (10/17/2023)  Health Literacy: Adequate Health Literacy (10/03/2023)     Physical Exam HENT:     Head: Normocephalic and atraumatic.     Right Ear: Tympanic membrane and external ear normal. There is no impacted cerumen.     Left Ear: Tympanic membrane and external ear normal. There is no impacted cerumen.     Mouth/Throat:     Mouth: Mucous membranes are moist.  Neck:     Thyroid : No thyroid  mass or thyroid  tenderness.   Cardiovascular:     Rate and Rhythm: Normal rate and regular rhythm.  Pulmonary:     Effort: Pulmonary effort is normal.     Breath sounds: Normal breath sounds. No wheezing.  Abdominal:     General: Bowel sounds are normal.     Palpations: Abdomen is soft.     Tenderness: There is no abdominal tenderness. There is no guarding.   Musculoskeletal:     Cervical back: Neck supple. No rigidity or tenderness.     Right lower leg: No edema.     Left lower leg: No edema.  Lymphadenopathy:     Cervical: No cervical adenopathy.   Skin:    General: Skin is warm.     Findings: Bruising (of b/l upper limbs noted) present.   Neurological:     Mental Status: She is alert and oriented to person, place, and time.   Psychiatric:        Mood and Affect: Mood normal.        Behavior: Behavior normal.       No results found for any visits on 10/17/23.  The ASCVD Risk score (Arnett DK, et al., 2019) failed to calculate for the following reasons:   Risk score cannot be calculated because patient has a medical history suggesting  prior/existing ASCVD     Assessment & Plan:  Pleasant 73 year old female presenting for TOC. B/L ears looks normal. The ear popping sensation on right ear after recent is most  likely due to barotrauma caused by pressure changes during air travel. Since her symptoms has improved I recommend if symptoms persist, worsen, or are accompanied by severe pain, hearing loss, or discharge, further evaluation in the clinic otherwise no intervention recommended at this time.    Acquired hypothyroidism Assessment & Plan: Chronic, no hypo-hyper thyroid  symptoms. Patient would like to get off Synthroid . Recommend normal TSH likely form use of Synthroid . We will check TSH and T4, if stable can consider reducing dose of Synthroid  50 mcg daily. I would close TSH monitoring if dose adjustment is to be made with with repeat TSH in 3 months. Patient agreeable.   Orders: -     TSH -     T4, free  Mixed hyperlipidemia Assessment & Plan: Declines pharmacotherapy. Since patient does not want pharmacological intervention, I am not going to check her lipid panel at this time.    Type II diabetes mellitus with complication [facility]) Assessment & Plan: Chronic issue.  A1c improved 01/22/23 6.6%, repeat A1c today.  Continue Mounjaro  to 12.5 mg weekly and Farxiga  10 mg daily. Continue.  Given DAPT therapy, bruising, no hypoglycemia symptoms I don't recommend patient checking home blood glucose daily. She should check blood glucose if she feels unwell or has hypoglycemia symptoms which was discussed in detail during her appointment. We can monitor A1c every 3 monthly.  Recommend checking urine microalbumin during next appointment. Was normal in 05/2022.  Orders: -     Hemoglobin A1c -     Mounjaro ; Inject 12.5 mg into the skin once a week.  Dispense: 6 mL; Refill: 2  Bruising Assessment & Plan: Chronic, stable. Likely secondary to DAPT and age related skin thinning.    Allergic rhinitis, unspecified seasonality,  unspecified trigger Assessment & Plan: Stable on Nasal Flonase  daily, continue   Orders: -     Fluticasone  Propionate; Place 2 sprays into both nostrils daily.  Dispense: 16 g; Refill: 0  Statin declined Assessment & Plan: Patient preference.    Other orders -     Saccharomyces boulardii; Take 1 capsule (250 mg total) by mouth daily.  Dispense: 90 capsule; Refill: 3   I spent 40 minutes on the day of this face-to-face encounter reviewing the patient's medical and surgical history, medications, ongoing concerns, and reviewing the assessment and plan with the patient. This time also included counseling the patient on their health conditions and management options. Additionally, I spent time post-visit ordering and reviewing diagnostics and therapeutics with the patient.  Return in about 6 months (around 04/17/2024).   Jacklin Mascot, MD

## 2023-10-17 NOTE — Assessment & Plan Note (Signed)
 Stable on Nasal Flonase  daily, continue

## 2023-10-17 NOTE — Assessment & Plan Note (Signed)
 Chronic, stable. Likely secondary to DAPT and age related skin thinning.

## 2023-10-17 NOTE — Assessment & Plan Note (Signed)
 Declines pharmacotherapy. Since patient does not want pharmacological intervention, I am not going to check her lipid panel at this time.

## 2023-10-17 NOTE — Assessment & Plan Note (Signed)
 Chronic, no hypo-hyper thyroid  symptoms. Patient would like to get off Synthroid . Recommend normal TSH likely form use of Synthroid . We will check TSH and T4, if stable can consider reducing dose of Synthroid  50 mcg daily. I would close TSH monitoring if dose adjustment is to be made with with repeat TSH in 3 months. Patient agreeable.

## 2023-10-18 ENCOUNTER — Other Ambulatory Visit: Payer: Self-pay

## 2023-10-18 ENCOUNTER — Other Ambulatory Visit (HOSPITAL_COMMUNITY): Payer: Self-pay

## 2023-10-19 ENCOUNTER — Other Ambulatory Visit: Payer: Self-pay

## 2023-11-01 ENCOUNTER — Other Ambulatory Visit (HOSPITAL_COMMUNITY): Payer: Self-pay

## 2023-11-01 ENCOUNTER — Other Ambulatory Visit: Payer: Self-pay

## 2023-11-05 ENCOUNTER — Other Ambulatory Visit: Payer: Self-pay

## 2023-11-05 ENCOUNTER — Other Ambulatory Visit (HOSPITAL_COMMUNITY): Payer: Self-pay

## 2023-11-05 DIAGNOSIS — E118 Type 2 diabetes mellitus with unspecified complications: Secondary | ICD-10-CM

## 2023-11-06 ENCOUNTER — Other Ambulatory Visit (HOSPITAL_COMMUNITY): Payer: Self-pay

## 2023-11-06 MED ORDER — LANCETS MISC. MISC: 1.0000 | Freq: Two times a day (BID) | 3 refills | Status: AC

## 2023-11-06 MED ORDER — BLOOD GLUCOSE MONITORING SUPPL DEVI: 1.0000 | Freq: Two times a day (BID) | 0 refills | Status: AC

## 2023-11-06 MED ORDER — BLOOD GLUCOSE TEST VI STRP
ORAL_STRIP | 5 refills | Status: AC
Start: 2023-11-06 — End: ?

## 2023-11-06 MED ORDER — LANCET DEVICE MISC: 1.0000 | Freq: Two times a day (BID) | 1 refills | Status: AC

## 2023-11-12 ENCOUNTER — Other Ambulatory Visit: Payer: Self-pay | Admitting: Obstetrics and Gynecology

## 2023-11-15 ENCOUNTER — Other Ambulatory Visit: Payer: Self-pay

## 2023-11-15 DIAGNOSIS — J309 Allergic rhinitis, unspecified: Secondary | ICD-10-CM

## 2023-11-19 NOTE — Telephone Encounter (Signed)
 1. Allergic rhinitis, unspecified seasonality, unspecified trigger - fluticasone  (FLONASE ) 50 MCG/ACT nasal spray; USE 2 SPRAYS IN EACH NOSTRIL ONCE DAILY AS DIRECTED  Dispense: 16 g; Refill: 3 Refill sent.   Luke Shade, MD

## 2023-12-04 ENCOUNTER — Telehealth: Payer: Self-pay

## 2023-12-04 DIAGNOSIS — E039 Hypothyroidism, unspecified: Secondary | ICD-10-CM

## 2023-12-04 NOTE — Telephone Encounter (Signed)
 1. Acquired hypothyroidism (Primary) - TSH; Future - T4, free; Future  Luke Shade, MD

## 2023-12-04 NOTE — Addendum Note (Signed)
 Addended by: VICCI SELLER A on: 12/04/2023 04:27 PM   Modules accepted: Orders

## 2023-12-04 NOTE — Telephone Encounter (Signed)
 Lab order needed

## 2023-12-04 NOTE — Progress Notes (Signed)
 Remote ICD transmission.

## 2023-12-11 ENCOUNTER — Other Ambulatory Visit: Payer: Self-pay | Admitting: *Deleted

## 2023-12-11 MED ORDER — DAPAGLIFLOZIN PROPANEDIOL 10 MG PO TABS: 10.0000 mg | ORAL_TABLET | Freq: Every day | ORAL | 3 refills | Status: DC

## 2023-12-11 MED ORDER — TICAGRELOR 90 MG PO TABS: 90.0000 mg | ORAL_TABLET | Freq: Two times a day (BID) | ORAL | 3 refills | Status: DC

## 2023-12-13 ENCOUNTER — Other Ambulatory Visit (INDEPENDENT_AMBULATORY_CARE_PROVIDER_SITE_OTHER)

## 2023-12-13 ENCOUNTER — Ambulatory Visit: Payer: Self-pay

## 2023-12-13 DIAGNOSIS — E039 Hypothyroidism, unspecified: Secondary | ICD-10-CM | POA: Diagnosis not present

## 2023-12-13 LAB — TSH: TSH: 3.75 u[IU]/mL (ref 0.35–5.50)

## 2023-12-13 LAB — T4, FREE: Free T4: 1.08 ng/dL (ref 0.60–1.60)

## 2023-12-20 ENCOUNTER — Ambulatory Visit: Attending: Cardiology | Admitting: Cardiology

## 2023-12-20 ENCOUNTER — Encounter: Payer: Self-pay | Admitting: Cardiology

## 2023-12-20 VITALS — BP 120/78 | HR 72 | Ht 63.5 in | Wt 124.4 lb

## 2023-12-20 DIAGNOSIS — E785 Hyperlipidemia, unspecified: Secondary | ICD-10-CM | POA: Diagnosis not present

## 2023-12-20 DIAGNOSIS — Z95810 Presence of automatic (implantable) cardiac defibrillator: Secondary | ICD-10-CM

## 2023-12-20 DIAGNOSIS — I10 Essential (primary) hypertension: Secondary | ICD-10-CM

## 2023-12-20 DIAGNOSIS — I7300 Raynaud's syndrome without gangrene: Secondary | ICD-10-CM | POA: Diagnosis not present

## 2023-12-20 DIAGNOSIS — I2510 Atherosclerotic heart disease of native coronary artery without angina pectoris: Secondary | ICD-10-CM

## 2023-12-20 DIAGNOSIS — I255 Ischemic cardiomyopathy: Secondary | ICD-10-CM

## 2023-12-20 DIAGNOSIS — Z9861 Coronary angioplasty status: Secondary | ICD-10-CM | POA: Diagnosis not present

## 2023-12-20 DIAGNOSIS — I5022 Chronic systolic (congestive) heart failure: Secondary | ICD-10-CM

## 2023-12-20 DIAGNOSIS — Z5320 Procedure and treatment not carried out because of patient's decision for unspecified reasons: Secondary | ICD-10-CM

## 2023-12-20 DIAGNOSIS — Z79899 Other long term (current) drug therapy: Secondary | ICD-10-CM | POA: Diagnosis not present

## 2023-12-20 NOTE — Patient Instructions (Signed)
 Medication Instructions:  Your physician recommends that you continue on your current medications as directed. Please refer to the Current Medication list given to you today.   *If you need a refill on your cardiac medications before your next appointment, please call your pharmacy*  Lab Work: Your provider would like for you to return in Few Days to have the following labs drawn: Hepatic Function Panel, Lipid Panel.   Please go to [facility] [address] ([facility] [address], [place] [address] You do not need an appointment.  They are open from 8 am- 4:30 pm.  Lunch from 1:00 pm- 2:00 pm You DO need to be fasting.   You may also go to one of the following [facility]:  [address] [place], [place] [address] Phone: [phone] Lab hours: Mon-Fri 8 am- 5 pm    Lunch 12 pm- 1 pm  [address] [place],  [place]  [address]  [place]  Phone: [phone] Lab hours: 7 am- 4 pm Lunch 12 pm-1 pm   [address] [place],  [place]  [address]  [place]  Phone: [phone] Lab hours: Mon-Fri 8 am- 5 pm    Lunch 12 pm- 1 pm  If you have labs (blood work) drawn today and your tests are completely normal, you will receive your results only by: MyChart Message (if you have MyChart) OR A paper copy in the mail If you have any lab test that is abnormal or we need to change your treatment, we will call you to review the results.  Testing/Procedures: None ordered at this time   Follow-Up: At [facility], you and your health needs are our priority.  As part of our continuing mission to provide you with exceptional heart care, our providers are all part of one team.  This team includes your primary Cardiologist (physician) and Advanced Practice Providers or APPs (Physician Assistants and Nurse Practitioners) who all work together to provide you with the care you need, when you need it.  Your next appointment:   6 month(s)  Provider:   Alm Clay, MD    We recommend  signing up for the patient portal called MyChart.  Sign up information is provided on this After Visit Summary.  MyChart is used to connect with patients for Virtual Visits (Telemedicine).  Patients are able to view lab/test results, encounter notes, upcoming appointments, etc.  Non-urgent messages can be sent to your provider as well.   To learn more about what you can do with MyChart, go to [email or web address].

## 2023-12-20 NOTE — Progress Notes (Signed)
 Cardiology Office Note:  .   Date:  12/23/2023  ID:  Whitney Knight, DOB 06-Jun-1950, MRN [id number] PCP: Abbey Bruckner, MD  [facility] Providers Cardiologist:  Lonni Hanson, MD Electrophysiologist:  OLE ONEIDA HOLTS, MD     Chief Complaint  Patient presents with   Follow-up    12 mth post MI.  31-month cardiology follow-up    Coronary Artery Disease    No active angina.  Walking 3 to 4 miles a day   Cardiomyopathy    Significant reduced EF of 25 to 30% following anterior MI but no heart failure symptoms.    Patient Profile: .     Whitney Knight is a 73 y.o. female with a PMH notable for CAD/Ischemic Cardiomyopathy (following delayed presentation anterior STEMI), DM-2, HTN, HLD, and hypothyroidism who presents here for 20-month follow-up.  This is the first visit I have seen her since cardiac catheterization and PCI.  CAD with late presenting anterior ST elevation MI,-wraparound LAD PCI (August 2024) Following PCI and diuresis, she felt much better-posthospital follow-up was walking 1/2 mile at a time when seen by Dr. Cherrie.  NYHA class II symptoms.  Volume status fine.  Lasix  was PRN. On ASA/Brilinta  DAPT ICM/HFrEF S/P ICD in (December 2024),  ARB discontinued due to hypotension.  On carvedilol  3.125 mg twice daily, Farxiga  10 mg daily and spironolactone  12.5 mg daily. DM2, HTN, HLD,  Refuses statin-husband had a bad issue with statins (refuses statins and Zetia as well as Nexletol). => Taking red yeast rice.  Wants to avoid PCSK9 Hamiter. On Mounjaro  and Farxiga  for diabetes Hypothyroidism: On 50 mcg of Synthroid .     [name] was last seen here by Bernardino Bring, PA on February 21: Doing well from a cardiac standpoint.  No active angina.  Walking roughly 3 miles a day on flat ground.  Maybe notes some exertional dyspnea walking uphill or up steps for prolonged duration.  No PND orthopnea or edema.  We discussed need for dental procedures and desired to 1  year of DAPT.  Pressures were well-controlled.  No changes to medications made.  Still declined pharmacotherapy for lipids.  She was then seen on March 18 by Suzann Riddle, NP [facility] for EP follow-up.  No complaints.  Walking 3 to 4 miles a day.  Also started lift weights.  No edema.  Some mild Raynaud's issues in cold weather.  ICD functioning well.  Subjective  Discussed the use of AI scribe software for clinical note transcription with the patient, who gave verbal consent to proceed.  History of Present Illness Whitney Knight is a 73 year old female with coronary artery disease and a history of myocardial infarction who presents for follow-up regarding her cardiac health and medication management.  Coronary artery disease and myocardial infarction. - History of delayed anterior myocardial infarction last year-presenting symptom was with CHF with EF roughly 30% and anterior wall motion abnormality.  Cardiac cath/angiography revealed  proximal to mid LAD occlusion with successful revascularization, but follow-up echocardiogram followed by cardiac MRI showed persistent abnormality in the affected area with a EF of roughly 25 to 30%.  Subsequently referred for ICD placed in December 2024. - Able to walk three to four miles daily without exertional symptoms with no complaints of chest pain, shortness of breath, or lower extremity edema. - Mild breathlessness only after a hot bath, attributed to hot water  She is currently taking aspirin  and Brilinta  90 mg daily and does note  Easy bruising, especially with minor trauma.  She also is due for some dental procedures to be done where selectivity to be helped.  Antihypertensive therapy and adverse effects - Currently taking carvedilol  6.25 mg twice daily and spironolactone  12.5 mg daily - History of discontinuing losartan  due to hypotension - History of discontinuing candesartan  due to dizziness; since stopping, no complaints of palpitations, dizziness, or  lightheadedness; syncope/near syncope or CVA/TIA/amaurosis fugax  Diabetes mellitus is currently being anaged with Mounjaro  12.5 mg and Farxiga  10 mg daily - Last hemoglobin A1c in June was 6.5  Hyperlipidemia management - Not taking statin (as well as Nexletol or Zetia) therapy by choice--apparently there was a long discussion in the past and she was given the go ahead by the doctor (not sure which 1) to stay off of medications.  Family is quite persistent.  She is now using only CoQ10 and red yeast rice as alternatives - Cholesterol levels not checked since last year - Taking Synthroid  50 mcg daily for hypothyroidism.  Psychological symptoms - Experiences anxiety when driving a large vehicle-when this happens, she does feel some palpitations.  Supplement use - Previously taking a hair supplement containing ashwagandha, discontinued as advised-indicating interaction with other medications  Cardiovascular ROS: no chest pain or dyspnea on exertion positive for - only gets tired / winded after hot shower; easy bruising. negative for - edema, irregular heartbeat, orthopnea, palpitations, paroxysmal nocturnal dyspnea, rapid heart rate, shortness of breath, or syncope/near syncoep; TIA/CVA/amaurosis fugax; claudication; no melena, hematochezia, hematuria, epistaxis..  ROS:  Review of Systems - per HPI    Objective   Family History: Mother died at 6 from sudden cardiac death, father died at 51 with COPD and CHF.  Studies Reviewed: SABRA   EKG Interpretation Date/Time:  Thursday December 20 2023 08:52:48 EDT Ventricular Rate:  72 PR Interval:  158 QRS Duration:  126 QT Interval:  406 QTC Calculation: 444 R Axis:   42  Text Interpretation: Normal sinus rhythm Non-specific intra-ventricular conduction block Cannot rule out Septal infarct (cited on or before 20-Dec-2023) T wave abnormality, consider inferior ischemia When compared with ECG of 22-Jun-2023 13:30, Inverted T waves have replaced  nonspecific T wave abnormality in Inferior leads Confirmed by Anner Lenis ([id number]) on 12/20/2023 9:26:45 AM    Lab Results  Component Value Date   CHOL 141 12/14/2022   HDL 36 (L) 12/14/2022   LDLCALC 87 12/14/2022   LDLDIRECT 118 (H) 10/04/2021   TRIG 91 12/14/2022   CHOLHDL 3.9 12/14/2022   Lab Results  Component Value Date   NA 137 03/21/2023   K 4.6 03/21/2023   CREATININE 0.99 03/21/2023   EGFR 61 03/21/2023   GLUCOSE 105 (H) 03/21/2023   Lab Results  Component Value Date   HGBA1C 6.5 10/17/2023   Lab Results  Component Value Date   WBC 9.5 03/21/2023   HGB 14.1 03/21/2023   HCT 44.4 03/21/2023   MCV 87 03/21/2023   PLT 225 03/21/2023   -----------------------------------------------------------  [employer] VVI ICD (04/11/2023)  Cardiac MRI 03/2023: Severly reduced LV Fxn EF ~28%. Transmural LGE/scar present in the LV mid-apical anterior, mid-apical anteroseptal, and apical walls. Normal RV size and function.  No significant valvular abnormalities.  Findings consistent with ischemic cardiomyopathy, prior infarct involving LAD territory.  ECHO: LVEF estimated 30 to 35%.  Moderate dysfunction.  Mid-distal anterior septal, apical lateral apical anterior and apex akinesis and mid-distal inferior, inferolateral and mid anterolateral as well as mid inferoseptal hypokinesis.  GR 2  DD.  Mild LA dilation.  Mild to moderate MR.  Normal aortic valve.  Normal RV size and function.  Moderate pleural effusion both left and right.  Moderate TR.  Normal RAP.  (12/14/2022) Follow-up Echo: EF estimated 25 to 30% with severely reduced function and global hypokinesis.  GR 2 DD.  Low normal RV function.  Mild MR.  Normal RAP.  (03/19/2023)  CATH: (12/14/2022): Severe single-vessel disease with 100% occlusion of the prox-mid LAD at SP1 otherwise no significant disease in a somewhat codominant system with the wraparound LAD providing the distal half to one third of the PDA  territory. Difficult but successful DES PCI of the LAD with a Resolute Onyx DES 2.5 mm x 30 mm stent postdilated and tapered fashion from 3.1 to 2.8 mm.  2 small diagonal branches noted after stent with 1 small diagonal that barely feels that arose from the original occluded section. Consistent combined CHF with known EF of 30 to 35% and PCWP of 29 millimercury and LVEDP of 21 mL of mercury, but preserved cardiac output put and index with cardiac output of 5.44 and index of 3.16. RHC numbers: RAP 5 mmHg; RV P-EDP 46/0-8 mmHg; PAP-mean 46/19-32 mmHg (mild to moderate WHO class II-LV CHF related), PCWP 28 mmHg.  LV P-EDP 113/5-21 mmHg; AO P-MAP 104/68-81 mmHg.  Ao sat 90%, PA sat 68%. Diagnostic: Dominance: Right       Intervention       Risk Assessment/Calculations:         Physical Exam:   VS:  BP 120/78 (BP Location: Left Arm, Patient Position: Sitting, Cuff Size: Normal)   Pulse 72   Ht 5' 3.5 (1.613 m)   Wt 124 lb 6.4 oz (56.4 kg)   SpO2 98% Comment: n/a  BMI 21.69 kg/m    Wt Readings from Last 3 Encounters:  12/20/23 124 lb 6.4 oz (56.4 kg)  10/17/23 124 lb 9.6 oz (56.5 kg)  10/03/23 124 lb 4 oz (56.4 kg)    GEN: Somewhat thin and frail but otherwise healthy-appearing.  Well-groomed.  No acute distress. NECK: No JVD; No carotid bruits CARDIAC: Normal S1, S2; RRR, no murmurs, rubs, gallops RESPIRATORY:  Clear to auscultation without rales, wheezing or rhonchi ; nonlabored, good air movement. ABDOMEN: Soft, non-tender, non-distended EXTREMITIES:  No edema; No deformity      ASSESSMENT AND PLAN: .    Problem List Items Addressed This Visit       Cardiology Problems   CAD S/P LAD PCI - Primary (Chronic)   One year post-stent placement, asymptomatic. Bruising likely due to antiplatelet therapy. - Discontinue aspirin . - Reduce Brilinta  to 60 mg daily. => As of now, okay to hold 5 to 7 days preop for surgeries or procedures. -Continue low-dose carvedilol  3.25 mg twice daily  along with Farxiga  10 mg daily and Mounjaro  12.5 mg weekly -See detailed discussion about her lack of desire to take medications for lipids.  Will order lab lipid panel, and will need to discuss management options depending on levels.      Chronic HFrEF (heart failure with reduced ejection fraction) (HCC) (Chronic)   Well-managed on current medications.  Court much asymptomatic.  NYHA Class I-II symptoms. Again GDMT limited by hypotension. Continue carvedilol  3.125 mg twice daily, spironolactone  12.5 mg daily along with Farxiga  10 Miller daily. Has not required standing diuretic. Volume level stable by ICD.       Hyperlipidemia with target low density lipoprotein (LDL) cholesterol less than 55 mg/dL (Chronic)  Not on statin (as well as Zetia and Nexletol) due to concern about potential side effects noted in her husband.  Cholesterol levels not checked since last August with LDL of 87.  Discussed importance of monitoring cholesterol levels to prevent further cardiac events. - Order fasting lipid panel and LFTs after returning from vacation. - Consider non-statin options for cholesterol management if LDL is not at target -=> would consider Leqvio based on minimal likelihood of side effect and minimal effort on the patient's part.      Ischemic cardiomyopathy (Chronic)   Ischemic cardiomyopathy EF of 25 to 30% by echo and follow-up MRI despite an LAD revascularization.  I suspect this is because of delayed presentation.  Although she felt better from a symptom standpoint I think is probably more the diuresis than it was the PCI.  She has not had any further angina.  Tolerating current medications and able to walk 3 to 4 miles a day with dyspnea only noted walking up steps. NYHA Class I-II Symptoms. ICD in place, functioning well with no notable events. GDMT somewhat limited by low blood pressures: ARB previously discontinued due to hypotension (as such, not able to titrate to  Entresto) Continue low-dose carvedilol  3.125 mg twice daily (dose already reduced from 6.25 mg twice daily) Continue spironolactone  12.5 mg daily along with Farxiga  10 Miller daily Although she is extremely thin, she is on Mounjaro  for diabetes which does provide some cardiovascular benefit.  Stressed the importance of adequate hydration.         Other   ICD (implantable cardioverter-defibrillator) in place (Chronic)   Defibrillator functioning appropriately, asymptomatic. - Travel with defibrillator box for safety.  (May be difficult traveling overseas)      Raynaud's phenomenon (Chronic)   Discussed keeping hands warm.  Amlodipine  may be beneficial, but her blood pressure may not tolerate. Consider Imdur.      Statin declined (Chronic)   See discussion above.  Will need to determine treatment options based on lab results.      Other Visit Diagnoses       Essential hypertension       Relevant Orders   EKG 12-Lead (Completed)     Medication management       Relevant Orders   Lipid panel   Hepatic function panel          Follow-Up: Return in about 6 months (around 06/21/2024) for Routine follow up with me, [place] office.  Total time spent: 26 min spent with patient + 32 min spent charting = 58 min I spent 58 minutes in the care of Whitney Knight today including reviewing labs (1 minute), reviewing studies (cardiac cath films as well as 2 echocardiograms and cardiac MRI all reviewed-8 minutes), face to face time discussing treatment options (26 minutes), reviewing records from hospitalization in August 2024 followed by several APP cardiology visits, Dr. Nelle heart failure as well as he visits (12 minutes), 11 minutes dictating, and documenting in the encounter.     Signed, Alm MICAEL Clay, MD, MS Alm Clay, M.D., M.S. Interventional Chartered certified accountant  Pager # [phone]

## 2023-12-21 NOTE — Telephone Encounter (Signed)
 Yes.  The plan would be for her to continue the current dose of Brilinta  until prescription is complete and then we will switch to 60 mg twice daily.  That we will be corrected on the chart  Alm Clay, MD

## 2023-12-23 ENCOUNTER — Encounter: Payer: Self-pay | Admitting: Cardiology

## 2023-12-23 DIAGNOSIS — I5022 Chronic systolic (congestive) heart failure: Secondary | ICD-10-CM | POA: Insufficient documentation

## 2023-12-23 NOTE — Assessment & Plan Note (Signed)
 Ischemic cardiomyopathy EF of 25 to 30% by echo and follow-up MRI despite an LAD revascularization.  I suspect this is because of delayed presentation.  Although she felt better from a symptom standpoint I think is probably more the diuresis than it was the PCI.  She has not had any further angina.  Tolerating current medications and able to walk 3 to 4 miles a day with dyspnea only noted walking up steps. NYHA Class I-II Symptoms. ICD in place, functioning well with no notable events. GDMT somewhat limited by low blood pressures: ARB previously discontinued due to hypotension (as such, not able to titrate to Entresto) Continue low-dose carvedilol  3.125 mg twice daily (dose already reduced from 6.25 mg twice daily) Continue spironolactone  12.5 mg daily along with Farxiga  10 Miller daily Although she is extremely thin, she is on Mounjaro  for diabetes which does provide some cardiovascular benefit.  Stressed the importance of adequate hydration.

## 2023-12-23 NOTE — Assessment & Plan Note (Signed)
 Not on statin (as well as Zetia and Nexletol) due to concern about potential side effects noted in her husband.  Cholesterol levels not checked since last August with LDL of 87.  Discussed importance of monitoring cholesterol levels to prevent further cardiac events. - Order fasting lipid panel and LFTs after returning from vacation. - Consider non-statin options for cholesterol management if LDL is not at target -=> would consider Leqvio based on minimal likelihood of side effect and minimal effort on the patient's part.

## 2023-12-23 NOTE — Assessment & Plan Note (Signed)
 See discussion above.  Will need to determine treatment options based on lab results.

## 2023-12-23 NOTE — Assessment & Plan Note (Signed)
 Discussed keeping hands warm.  Amlodipine  may be beneficial, but her blood pressure may not tolerate. Consider Imdur.

## 2023-12-23 NOTE — Assessment & Plan Note (Signed)
 Defibrillator functioning appropriately, asymptomatic. - Travel with defibrillator box for safety.  (May be difficult traveling overseas)

## 2023-12-23 NOTE — Assessment & Plan Note (Signed)
 One year post-stent placement, asymptomatic. Bruising likely due to antiplatelet therapy. - Discontinue aspirin . - Reduce Brilinta  to 60 mg daily. => As of now, okay to hold 5 to 7 days preop for surgeries or procedures. -Continue low-dose carvedilol  3.25 mg twice daily along with Farxiga  10 mg daily and Mounjaro  12.5 mg weekly -See detailed discussion about her lack of desire to take medications for lipids.  Will order lab lipid panel, and will need to discuss management options depending on levels.

## 2023-12-23 NOTE — Assessment & Plan Note (Signed)
 Well-managed on current medications.  Court much asymptomatic.  NYHA Class I-II symptoms. Again GDMT limited by hypotension. Continue carvedilol  3.125 mg twice daily, spironolactone  12.5 mg daily along with Farxiga  10 Miller daily. Has not required standing diuretic. Volume level stable by ICD.

## 2023-12-24 ENCOUNTER — Other Ambulatory Visit: Payer: Self-pay | Admitting: *Deleted

## 2023-12-24 MED ORDER — TICAGRELOR 60 MG PO TABS: 60.0000 mg | ORAL_TABLET | Freq: Two times a day (BID) | ORAL | 11 refills | Status: AC

## 2023-12-26 NOTE — Progress Notes (Signed)
 Sent the following message to the patient via MyChart; patient  last active on MyChart on 12/25/23:  Good afternoon.  You had a question about ashwagandha that you had asked Dr. Anner.  He has done some research and wanted me to forward you the following.  When I saw Ms. Skalla, this was the question that she had about this Ashwaganda.  There are no cardiac medications that it interacts with, but there is a concern about her levothyroxine .  Will probably need to discuss with whoever prescribes the levothyroxine .  I hope that helps answer your question.  Please do not hesitate to call us  or message us  through MyChart if you have any further questions or concerns.  Thank you and have a great day.  Abby, RN

## 2024-01-02 ENCOUNTER — Other Ambulatory Visit (HOSPITAL_COMMUNITY): Payer: Self-pay

## 2024-01-10 ENCOUNTER — Ambulatory Visit (INDEPENDENT_AMBULATORY_CARE_PROVIDER_SITE_OTHER): Payer: Medicare Other

## 2024-01-10 DIAGNOSIS — I255 Ischemic cardiomyopathy: Secondary | ICD-10-CM

## 2024-01-14 ENCOUNTER — Encounter: Payer: Self-pay | Admitting: Cardiology

## 2024-01-14 LAB — CUP PACEART REMOTE DEVICE CHECK
Battery Remaining Longevity: 180 mo
Battery Remaining Percentage: 100 %
Brady Statistic RV Percent Paced: 0 %
Date Time Interrogation Session: 20250915025200
HighPow Impedance: 92 Ohm
Implantable Lead Connection Status: 753985
Implantable Lead Implant Date: 20241211
Implantable Lead Location: 753860
Implantable Lead Model: 672
Implantable Lead Serial Number: 263510
Implantable Pulse Generator Implant Date: 20241211
Lead Channel Impedance Value: 499 Ohm
Lead Channel Pacing Threshold Amplitude: 0.6 V
Lead Channel Pacing Threshold Pulse Width: 0.4 ms
Lead Channel Setting Pacing Amplitude: 3.5 V
Lead Channel Setting Pacing Pulse Width: 0.4 ms
Lead Channel Setting Sensing Sensitivity: 0.5 mV
Pulse Gen Serial Number: 340837
Zone Setting Status: 755011

## 2024-01-17 ENCOUNTER — Ambulatory Visit: Payer: Self-pay | Admitting: Cardiology

## 2024-01-18 NOTE — Progress Notes (Signed)
Remote ICD Transmission.

## 2024-02-19 ENCOUNTER — Other Ambulatory Visit: Payer: Self-pay | Admitting: Physician Assistant

## 2024-03-10 ENCOUNTER — Encounter: Payer: Self-pay | Admitting: Pharmacist

## 2024-03-10 NOTE — Progress Notes (Signed)
 Pharmacy Quality Measure Review  This patient is appearing on a report for being at risk of failing the Kidney Health Evaluation for Patients with Diabetes measure this calendar year.   Last documented UACR: 2022 Lab Results  Component Value Date   MICRALBCREAT 14 04/04/2021   PCP visit upcoming, Dec. 2025.  Encounter note placed to consider UACR. Future msg sent to PCP.

## 2024-03-19 NOTE — Progress Notes (Signed)
 Whitney Knight                                          MRN: [id number]   03/19/2024   The VBCI Quality Team Specialist reviewed this patient medical record for the purposes of chart review for care gap closure. The following were reviewed: chart review for care gap closure-breast cancer screening and kidney health evaluation for diabetes:eGFR  and uACR.    VBCI Quality Team

## 2024-03-28 ENCOUNTER — Other Ambulatory Visit: Payer: Self-pay

## 2024-03-28 DIAGNOSIS — J309 Allergic rhinitis, unspecified: Secondary | ICD-10-CM

## 2024-04-10 ENCOUNTER — Ambulatory Visit: Payer: Medicare Other

## 2024-04-10 DIAGNOSIS — I255 Ischemic cardiomyopathy: Secondary | ICD-10-CM

## 2024-04-11 ENCOUNTER — Other Ambulatory Visit: Payer: Self-pay | Admitting: Cardiology

## 2024-04-11 LAB — CUP PACEART REMOTE DEVICE CHECK
Battery Remaining Longevity: 174 mo
Battery Remaining Percentage: 100 %
Brady Statistic RV Percent Paced: 0 %
Date Time Interrogation Session: 20251211025100
HighPow Impedance: 93 Ohm
Implantable Lead Connection Status: 753985
Implantable Lead Implant Date: 20241211
Implantable Lead Location: 753860
Implantable Lead Model: 672
Implantable Lead Serial Number: 263510
Implantable Pulse Generator Implant Date: 20241211
Lead Channel Impedance Value: 500 Ohm
Lead Channel Pacing Threshold Amplitude: 0.6 V
Lead Channel Pacing Threshold Pulse Width: 0.4 ms
Lead Channel Setting Pacing Amplitude: 3.5 V
Lead Channel Setting Pacing Pulse Width: 0.4 ms
Lead Channel Setting Sensing Sensitivity: 0.5 mV
Pulse Gen Serial Number: 340837
Zone Setting Status: 755011

## 2024-04-17 ENCOUNTER — Ambulatory Visit

## 2024-04-17 NOTE — Progress Notes (Signed)
 Remote ICD Transmission

## 2024-04-18 ENCOUNTER — Ambulatory Visit: Payer: Self-pay | Admitting: Cardiology

## 2024-04-22 NOTE — Progress Notes (Signed)
 Whitney Knight                                          MRN: [id number]   04/22/2024   The VBCI Quality Team Specialist reviewed this patient medical record for the purposes of chart review for care gap closure. The following were reviewed: chart review for care gap closure-breast cancer screening and kidney health evaluation for diabetes:eGFR  and uACR.    VBCI Quality Team

## 2024-04-27 ENCOUNTER — Other Ambulatory Visit: Payer: Self-pay | Admitting: Physician Assistant

## 2024-04-28 ENCOUNTER — Other Ambulatory Visit: Payer: Self-pay | Admitting: Cardiology

## 2024-04-30 ENCOUNTER — Other Ambulatory Visit: Payer: Self-pay

## 2024-04-30 MED ORDER — CARVEDILOL 6.25 MG PO TABS: 6.2500 mg | ORAL_TABLET | Freq: Two times a day (BID) | ORAL | 2 refills | Status: AC

## 2024-04-30 MED ORDER — DAPAGLIFLOZIN PROPANEDIOL 10 MG PO TABS: 10.0000 mg | ORAL_TABLET | Freq: Every day | ORAL | 2 refills | Status: AC

## 2024-04-30 MED ORDER — SPIRONOLACTONE 25 MG PO TABS: 12.5000 mg | ORAL_TABLET | Freq: Every day | ORAL | 2 refills | Status: AC

## 2024-05-06 ENCOUNTER — Other Ambulatory Visit (HOSPITAL_COMMUNITY): Payer: Self-pay

## 2024-05-06 ENCOUNTER — Encounter (HOSPITAL_COMMUNITY): Payer: Self-pay

## 2024-05-06 ENCOUNTER — Other Ambulatory Visit: Payer: Self-pay

## 2024-05-06 DIAGNOSIS — E118 Type 2 diabetes mellitus with unspecified complications: Secondary | ICD-10-CM

## 2024-05-06 MED ORDER — MOUNJARO 12.5 MG/0.5ML ~~LOC~~ SOAJ
12.5000 mg | SUBCUTANEOUS | 0 refills | Status: AC
  Filled 2024-05-06: qty 6, 84d supply, fill #0

## 2024-05-07 ENCOUNTER — Other Ambulatory Visit (HOSPITAL_COMMUNITY): Payer: Self-pay

## 2024-05-09 ENCOUNTER — Encounter: Payer: Self-pay | Admitting: Cardiology

## 2024-05-09 NOTE — Telephone Encounter (Signed)
 Error

## 2024-05-14 ENCOUNTER — Other Ambulatory Visit: Payer: Self-pay

## 2024-05-15 ENCOUNTER — Other Ambulatory Visit: Payer: Self-pay

## 2024-05-15 ENCOUNTER — Other Ambulatory Visit (HOSPITAL_COMMUNITY): Payer: Self-pay

## 2024-05-16 ENCOUNTER — Other Ambulatory Visit: Payer: Self-pay

## 2024-05-17 ENCOUNTER — Other Ambulatory Visit (HOSPITAL_COMMUNITY): Payer: Self-pay

## 2024-06-17 ENCOUNTER — Ambulatory Visit: Admitting: Physician Assistant

## 2024-07-16 ENCOUNTER — Ambulatory Visit: Admitting: Cardiology

## 2024-07-17 ENCOUNTER — Ambulatory Visit

## 2024-10-07 ENCOUNTER — Ambulatory Visit
